# Patient Record
Sex: Female | Born: 1945 | Race: White | Hispanic: No | Smoking: Never smoker
Health system: Southern US, Community
[De-identification: ages and names within clinical notes are randomized; demographics above are authoritative.]

## PROBLEM LIST (undated history)

## (undated) DIAGNOSIS — J45909 Unspecified asthma, uncomplicated: Secondary | ICD-10-CM

## (undated) DIAGNOSIS — M1990 Unspecified osteoarthritis, unspecified site: Secondary | ICD-10-CM

## (undated) DIAGNOSIS — I10 Essential (primary) hypertension: Secondary | ICD-10-CM

## (undated) DIAGNOSIS — Z5189 Encounter for other specified aftercare: Secondary | ICD-10-CM

## (undated) DIAGNOSIS — M810 Age-related osteoporosis without current pathological fracture: Secondary | ICD-10-CM

## (undated) HISTORY — PX: COLON SURGERY: SHX602

## (undated) HISTORY — PX: TUBAL LIGATION: SHX77

---

## 1998-04-03 ENCOUNTER — Ambulatory Visit (HOSPITAL_COMMUNITY): Admission: RE | Admit: 1998-04-03 | Discharge: 1998-04-03 | Payer: Self-pay | Admitting: Family Medicine

## 1998-04-03 ENCOUNTER — Inpatient Hospital Stay (HOSPITAL_COMMUNITY): Admission: EM | Admit: 1998-04-03 | Discharge: 1998-04-09 | Payer: Self-pay | Admitting: Emergency Medicine

## 1998-04-03 ENCOUNTER — Encounter: Payer: Self-pay | Admitting: Gastroenterology

## 1999-01-22 ENCOUNTER — Ambulatory Visit (HOSPITAL_COMMUNITY): Admission: RE | Admit: 1999-01-22 | Discharge: 1999-01-22 | Payer: Self-pay | Admitting: Gastroenterology

## 2000-11-24 ENCOUNTER — Encounter: Payer: Self-pay | Admitting: Gastroenterology

## 2000-11-24 ENCOUNTER — Encounter: Admission: RE | Admit: 2000-11-24 | Discharge: 2000-11-24 | Payer: Self-pay | Admitting: Gastroenterology

## 2000-12-02 ENCOUNTER — Ambulatory Visit (HOSPITAL_COMMUNITY): Admission: RE | Admit: 2000-12-02 | Discharge: 2000-12-02 | Payer: Self-pay | Admitting: Gastroenterology

## 2000-12-02 ENCOUNTER — Encounter (INDEPENDENT_AMBULATORY_CARE_PROVIDER_SITE_OTHER): Payer: Self-pay | Admitting: *Deleted

## 2010-06-19 ENCOUNTER — Other Ambulatory Visit: Payer: Self-pay | Admitting: Obstetrics and Gynecology

## 2010-07-12 NOTE — Procedures (Signed)
[place]. [facility]  Patient:    Monica Conway                          MRN: [id number] Proc. Date: 01/22/99 Adm. Date:  [id number] Attending:  Charna Elizabeth CC:         Meliton Rattan, M.D.                           Procedure Report  DATE OF BIRTH:  1945-02-28.  REFERRING PHYSICIAN:  Meliton Rattan, M.D.  PROCEDURE:  Flexible sigmoidoscopy.  ENDOSCOPIST:  Anselmo Rod, M.D.  INSTRUMENTS:  Olympus video colonoscope.  INDICATION:  Screening flexible sigmoidoscopy being done in a 65 year old white  female.  Rule out polyps, AVMs, masses, hemorrhoids, etc.  INFORMED CONSENT:  Informed consent was procured from the patient.  The patient was fasted for eight hours prior to the procedure and prepped with two Fleets enemas the morning of the procedure.  PHYSICAL EXAMINATION:  VITAL SIGNS:  Stable.  NECK:  Supple.  CHEST:  Clear to auscultation.  HEART:  S1 and S2 regular.  ABDOMEN:  Soft with normal abdominal bowel sounds.  One healed surgical scar present.  DESCRIPTION OF PROCEDURE:  The patient was placed in the left lateral decubitus  position.  No sedation was used.  Once the patient was adequately positioned, the Olympus video colonoscope was advanced from the rectum to 60 cm without difficulty.  Except for small internal hemorrhoids, no other abnormalities were seen.  The patient tolerated the procedure well without complications.  IMPRESSION:  Normal flexible sigmoidoscopy up to 60 cm except for small nonbleeding internal hemorrhoids.  RECOMMENDATIONS:  Repeat flexible sigmoidoscopy recommended in the next five years or earlier if need be.  High fiber diet has been advised for the patient and an  increase in fibers has been applied and follow-up as advised and needed. DD:  01/22/99 TD:  01/22/99 Job: [id number]

## 2010-07-12 NOTE — Procedures (Signed)
[place]. [facility]  Patient:    Monica Conway, Monica Conway Visit Number: [id number] MRN: [id number]          Service Type: END Location: ENDO Attending Physician:  Charna Elizabeth Dictated by:   Anselmo Rod, M.D. Proc. Date: 12/02/00 Admit Date:  12/02/2000   CC:         Gabriel Earing, M.D.   Procedure Report  DATE OF BIRTH:  Oct 26, 1945.  REFERRING PHYSICIAN:  Gabriel Earing, M.D.  PROCEDURE PERFORMED:  Esophagogastroduodenoscopy with biopsies and dilatation of a Schatzkis ring.  ENDOSCOPIST:  Anselmo Rod, M.D.  INSTRUMENT USED:  Olympus video panendoscope.  INDICATIONS FOR PROCEDURE:  The patient is a 65 year old white female with a two-month history of dysphagia especially for solids.  Barium swallow showed a Schatzkis ring which did not allow the passage of a 12.5 mm barium tablet.  PREPROCEDURE PREPARATION:  Informed consent was procured from the patient. The patient was fasted for eight hours prior to the procedure.  PREPROCEDURE PHYSICAL:  The patient had stable vital signs.  Neck supple. Chest clear to auscultation.  S1, S2 regular.  Abdomen soft with normal abdominal bowel sounds.  A well-healed surgical scar from previous surgery secondary to an intussusception.  DESCRIPTION OF PROCEDURE:  The patient was placed in left lateral decubitus position and sedated with 50 mg of Demerol and 5 mg of Versed intravenously. Once the patient was adequately sedated and maintained on low-flow oxygen and continuous cardiac monitoring, the Olympus video panendoscope was advanced through the mouthpiece, over the tongue, into the esophagus under direct vision.  The proximal esophagus appeared normal.  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  On advancement of the scope into the stomach there was a small hiatal hernia seen on high retroflexion.  There was a linear antral ulcer that was biopsied for pathology to rule out H.  pylori. The proximal small bowel appeared normal.  There was mild diffuse gastritis noticed throughout the gastric mucosa.  The Schatzkis ring was dilated with the 16 and 17 French Savary dilator over the guide wire in routine manner but repeat endoscopy did not show much of a change in the Schatzkis ring and therefore the patients position was changed into the supine position and she was propped up on the stretcher and dilated with [facility] dilators sized 52 and 54 mm.  The patient tolerated the procedure well without complications.  IMPRESSION: 1. 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  See description    above. 2. Linear antral ulcer biopsied for pathology. 3. Small hiatal hernia. 4. Moderate diffuse gastritis. 5. Normal proximal small bowel.  RECOMMENDATION: 1. Continue PPIs. 2. Treat with antibiotics if Helicobacter pylori present on pathology. 3. Avoid all nonsteroidals. 4. Follow antireflux measures. 5. Soft diet for the next two to three days. 6. Outpatient follow-up within the next seven to 10 days. Dictated by:   Anselmo Rod, M.D. Attending Physician:  Charna Elizabeth DD:  12/02/00 TD:  12/02/00 Job: [id number]

## 2010-07-24 ENCOUNTER — Other Ambulatory Visit: Payer: Self-pay | Admitting: Obstetrics and Gynecology

## 2010-07-24 ENCOUNTER — Encounter (HOSPITAL_COMMUNITY): Payer: BC Managed Care – PPO

## 2010-07-24 LAB — BASIC METABOLIC PANEL
CO2: 15 mEq/L — ABNORMAL LOW (ref 19–32)
Calcium: 9.7 mg/dL (ref 8.4–10.5)
Creatinine, Ser: 1.73 mg/dL — ABNORMAL HIGH (ref 0.4–1.2)
GFR calc Af Amer: 36 mL/min — ABNORMAL LOW (ref >60)
Glucose, Bld: 116 mg/dL — ABNORMAL HIGH (ref 70–99)

## 2010-07-24 LAB — CBC
HCT: 41.5 % (ref 36.0–46.0)
Hemoglobin: 13.7 g/dL (ref 12.0–15.0)
MCH: 29.1 pg (ref 26.0–34.0)
MCHC: 33 g/dL (ref 30.0–36.0)
RDW: 15.9 % — ABNORMAL HIGH (ref 11.5–15.5)

## 2010-08-02 ENCOUNTER — Ambulatory Visit (HOSPITAL_COMMUNITY)
Admission: RE | Admit: 2010-08-02 | Discharge: 2010-08-02 | Disposition: A | Discharge status: Home / Self Care | Payer: BC Managed Care – PPO | Source: Ambulatory Visit | Attending: Obstetrics and Gynecology | Admitting: Obstetrics and Gynecology

## 2010-08-02 ENCOUNTER — Other Ambulatory Visit: Payer: Self-pay | Admitting: Obstetrics and Gynecology

## 2010-08-02 DIAGNOSIS — Z01818 Encounter for other preprocedural examination: Secondary | ICD-10-CM | POA: Insufficient documentation

## 2010-08-02 DIAGNOSIS — Z01812 Encounter for preprocedural laboratory examination: Secondary | ICD-10-CM | POA: Insufficient documentation

## 2010-08-02 DIAGNOSIS — R87613 High grade squamous intraepithelial lesion on cytologic smear of cervix (HGSIL): Secondary | ICD-10-CM | POA: Insufficient documentation

## 2010-08-12 NOTE — Op Note (Signed)
NAME:  Monica Conway, VIRAMONTES NO.:  [id number]  MEDICAL RECORD NO.:  [id number]  LOCATION:  [facility]                          FACILITY:  [facility]  PHYSICIAN:  Miguel Aschoff, M.D.       DATE OF BIRTH:  December 19, 1945  DATE OF PROCEDURE: DATE OF DISCHARGE:                              OPERATIVE REPORT   PREOPERATIVE DIAGNOSIS:  High-grade cervical dysplasia.  POSTOPERATIVE DIAGNOSIS:  High-grade cervical dysplasia.  PROCEDURE:  Cervical conization and endocervical curettage.  SURGEON:  Miguel Aschoff, MD  ANESTHESIA:  General.  COMPLICATIONS:  None.  JUSTIFICATION:  The patient is a 65 year old white female with history of high-grade cervical dysplasia, colposcopy is unsatisfactory because of the atrophic and stenotic nature of her cervix.  It is not possible to visualize the cervix satisfactorily with colposcopy.  Because of this, she presents now to undergo cone biopsy and endocervical curettage as both diagnostic procedure and hopefully therapeutic procedure to be for high-grade dysplasia.  Risks and benefits of procedure were discussed with the patient.  PROCEDURE:  The patient was taken to the operating room, placed in supine position.  General anesthesia was administered without difficulty.  She was then placed in dorsal lithotomy position, prepped and draped in the usual sterile fashion.  Bladder was catheterized. Once this was done, examination under anesthesia revealed normal external genitalia, normal Bartholin and Skene glands, normal urethra. The vaginal vault was with gross lesion.  There was a second-degree rectocele, first-degree cystocele.  The apex of vagina, however, revealed very atrophic cervix very hard to identify, but it was possible to palpate the cervix.  The cervix was then grasped with tenaculum. Once this was done, cervix and the vaginal vault were stained with Lugol's solution.  There was no significant areas of poor Lugol's uptake in the vagina and  the Lugol's was taken out irregularly on the cervix. At this point, figure-of-eight sutures of 0 chromic were placed by placing sutures at the 2-4 o'clock positions and then the 8 o'clock and 10 o'clock positions for retraction and to occlude the descending branch of the cervical artery.  Once this was done, the cervix was injected with 8 mL 1% Xylocaine with epinephrine.  Then a cone of tissue was cut with care to avoid any injury to any adjacent structures.  It was somewhat difficult to do because of atrophic nature of the cervix, but a satisfactory specimen was obtained.  Once this was done, the residual portion of the endocervical canal was curetted and a scant amount of tissue was sent for histologic study.  The bed of the cone biopsy site was then cauterized with electrocautery.  Hemostasis was brought under control without difficulty and then the defect was packed with 3 pieces of Gelfoam and the previously placed sutures were tied across the cervix to hold the Gelfoam in place.  The estimated blood loss was minimal. The patient tolerated the procedure well.  Plan is for the patient to be discharged home.  Medications for home include doxycycline 100 mg twice a day x3 days, Ultram 50 mg every 6-8 hours as needed for pain.  The patient is to resume all her other medications.  She was instructed to place nothing in the vagina for 4 weeks, to call for any problems such as fever, pain or heavy bleeding and to call for pathology report on August 07, 2010.     Miguel Aschoff, M.D.     AR/MEDQ  D:  08/02/2010  T:  08/02/2010  Job:  [id number]  Electronically Signed by Miguel Aschoff M.D. on 08/12/2010 05:38:21 PM

## 2010-11-01 ENCOUNTER — Other Ambulatory Visit: Payer: Self-pay | Admitting: Obstetrics and Gynecology

## 2012-06-23 ENCOUNTER — Other Ambulatory Visit: Payer: Self-pay | Admitting: Obstetrics and Gynecology

## 2013-06-24 ENCOUNTER — Other Ambulatory Visit: Payer: Self-pay | Admitting: Obstetrics and Gynecology

## 2013-06-28 ENCOUNTER — Other Ambulatory Visit: Payer: Self-pay | Admitting: Obstetrics and Gynecology

## 2013-06-28 DIAGNOSIS — N63 Unspecified lump in unspecified breast: Secondary | ICD-10-CM

## 2013-07-07 ENCOUNTER — Encounter (INDEPENDENT_AMBULATORY_CARE_PROVIDER_SITE_OTHER): Payer: Self-pay

## 2013-07-07 ENCOUNTER — Ambulatory Visit
Admission: RE | Admit: 2013-07-07 | Discharge: 2013-07-07 | Disposition: A | Discharge status: Home / Self Care | Payer: Medicare HMO | Source: Ambulatory Visit | Attending: Obstetrics and Gynecology | Admitting: Obstetrics and Gynecology

## 2013-07-07 DIAGNOSIS — N63 Unspecified lump in unspecified breast: Secondary | ICD-10-CM

## 2014-02-04 ENCOUNTER — Emergency Department (HOSPITAL_COMMUNITY): Payer: Medicare HMO

## 2014-02-04 ENCOUNTER — Encounter (HOSPITAL_COMMUNITY): Payer: Self-pay | Admitting: *Deleted

## 2014-02-04 ENCOUNTER — Inpatient Hospital Stay (HOSPITAL_COMMUNITY)
Admission: EM | Admit: 2014-02-04 | Discharge: 2014-02-07 | DRG: 871 | Disposition: A | Discharge status: Skilled Nursing Facility | Payer: Medicare HMO | Attending: Internal Medicine | Admitting: Internal Medicine

## 2014-02-04 DIAGNOSIS — N390 Urinary tract infection, site not specified: Secondary | ICD-10-CM

## 2014-02-04 DIAGNOSIS — Z91048 Other nonmedicinal substance allergy status: Secondary | ICD-10-CM

## 2014-02-04 DIAGNOSIS — Z8249 Family history of ischemic heart disease and other diseases of the circulatory system: Secondary | ICD-10-CM

## 2014-02-04 DIAGNOSIS — R6510 Systemic inflammatory response syndrome (SIRS) of non-infectious origin without acute organ dysfunction: Secondary | ICD-10-CM | POA: Diagnosis present

## 2014-02-04 DIAGNOSIS — J45909 Unspecified asthma, uncomplicated: Secondary | ICD-10-CM | POA: Diagnosis present

## 2014-02-04 DIAGNOSIS — M1990 Unspecified osteoarthritis, unspecified site: Secondary | ICD-10-CM | POA: Diagnosis present

## 2014-02-04 DIAGNOSIS — E860 Dehydration: Secondary | ICD-10-CM

## 2014-02-04 DIAGNOSIS — D72829 Elevated white blood cell count, unspecified: Secondary | ICD-10-CM | POA: Diagnosis present

## 2014-02-04 DIAGNOSIS — A419 Sepsis, unspecified organism: Principal | ICD-10-CM

## 2014-02-04 DIAGNOSIS — R319 Hematuria, unspecified: Secondary | ICD-10-CM

## 2014-02-04 DIAGNOSIS — G9340 Encephalopathy, unspecified: Secondary | ICD-10-CM

## 2014-02-04 DIAGNOSIS — Z66 Do not resuscitate: Secondary | ICD-10-CM | POA: Diagnosis present

## 2014-02-04 DIAGNOSIS — R509 Fever, unspecified: Secondary | ICD-10-CM

## 2014-02-04 DIAGNOSIS — Z79899 Other long term (current) drug therapy: Secondary | ICD-10-CM

## 2014-02-04 DIAGNOSIS — I10 Essential (primary) hypertension: Secondary | ICD-10-CM | POA: Diagnosis present

## 2014-02-04 DIAGNOSIS — E876 Hypokalemia: Secondary | ICD-10-CM | POA: Diagnosis present

## 2014-02-04 DIAGNOSIS — R410 Disorientation, unspecified: Secondary | ICD-10-CM

## 2014-02-04 DIAGNOSIS — R7989 Other specified abnormal findings of blood chemistry: Secondary | ICD-10-CM | POA: Diagnosis present

## 2014-02-04 DIAGNOSIS — R809 Proteinuria, unspecified: Secondary | ICD-10-CM | POA: Diagnosis present

## 2014-02-04 DIAGNOSIS — R945 Abnormal results of liver function studies: Secondary | ICD-10-CM

## 2014-02-04 HISTORY — DX: Encounter for other specified aftercare: Z51.89

## 2014-02-04 HISTORY — DX: Unspecified asthma, uncomplicated: J45.909

## 2014-02-04 HISTORY — DX: Unspecified osteoarthritis, unspecified site: M19.90

## 2014-02-04 HISTORY — DX: Essential (primary) hypertension: I10

## 2014-02-04 LAB — COMPREHENSIVE METABOLIC PANEL
ALBUMIN: 3.8 g/dL (ref 3.5–5.2)
ALK PHOS: 91 U/L (ref 39–117)
ALT: 56 U/L — AB (ref 0–35)
AST: 157 U/L — AB (ref 0–37)
Anion gap: 20 — ABNORMAL HIGH (ref 5–15)
BUN: 42 mg/dL — ABNORMAL HIGH (ref 6–23)
CO2: 16 mEq/L — ABNORMAL LOW (ref 19–32)
Calcium: 8.3 mg/dL — ABNORMAL LOW (ref 8.4–10.5)
Chloride: 106 mEq/L (ref 96–112)
Creatinine, Ser: 1.21 mg/dL — ABNORMAL HIGH (ref 0.50–1.10)
GFR calc non Af Amer: 45 mL/min — ABNORMAL LOW (ref >90)
GFR, EST AFRICAN AMERICAN: 52 mL/min — AB (ref >90)
Glucose, Bld: 125 mg/dL — ABNORMAL HIGH (ref 70–99)
POTASSIUM: 4.1 meq/L (ref 3.7–5.3)
Sodium: 142 mEq/L (ref 137–147)
Total Bilirubin: 1 mg/dL (ref 0.3–1.2)
Total Protein: 8.3 g/dL (ref 6.0–8.3)

## 2014-02-04 LAB — URINALYSIS, ROUTINE W REFLEX MICROSCOPIC
Glucose, UA: NEGATIVE mg/dL
Ketones, ur: 15 mg/dL — AB
NITRITE: NEGATIVE
SPECIFIC GRAVITY, URINE: 1.025 (ref 1.005–1.030)
Urobilinogen, UA: 0.2 mg/dL (ref 0.0–1.0)
pH: 6 (ref 5.0–8.0)

## 2014-02-04 LAB — URINE MICROSCOPIC-ADD ON

## 2014-02-04 LAB — CBC WITH DIFFERENTIAL/PLATELET
BASOS ABS: 0.1 10*3/uL (ref 0.0–0.1)
BASOS PCT: 0 % (ref 0–1)
EOS ABS: 0 10*3/uL (ref 0.0–0.7)
EOS PCT: 0 % (ref 0–5)
HCT: 41.5 % (ref 36.0–46.0)
Hemoglobin: 13.6 g/dL (ref 12.0–15.0)
LYMPHS ABS: 1.2 10*3/uL (ref 0.7–4.0)
Lymphocytes Relative: 7 % — ABNORMAL LOW (ref 12–46)
MCH: 29.2 pg (ref 26.0–34.0)
MCHC: 32.8 g/dL (ref 30.0–36.0)
MCV: 89.2 fL (ref 78.0–100.0)
Monocytes Absolute: 1.9 10*3/uL — ABNORMAL HIGH (ref 0.1–1.0)
Monocytes Relative: 12 % (ref 3–12)
Neutro Abs: 13.1 10*3/uL — ABNORMAL HIGH (ref 1.7–7.7)
Neutrophils Relative %: 81 % — ABNORMAL HIGH (ref 43–77)
PLATELETS: 317 10*3/uL (ref 150–400)
RBC: 4.65 MIL/uL (ref 3.87–5.11)
RDW: 16.1 % — ABNORMAL HIGH (ref 11.5–15.5)
WBC: 16.3 10*3/uL — ABNORMAL HIGH (ref 4.0–10.5)

## 2014-02-04 LAB — I-STAT TROPONIN, ED: TROPONIN I, POC: 0.01 ng/mL (ref 0.00–0.08)

## 2014-02-04 LAB — I-STAT CG4 LACTIC ACID, ED: LACTIC ACID, VENOUS: 0.85 mmol/L (ref 0.5–2.2)

## 2014-02-04 MED ORDER — ACETAMINOPHEN 325 MG PO TABS
650.0000 mg | ORAL_TABLET | Freq: Once | ORAL | Status: AC
  Administered 2014-02-04: 650 mg via ORAL
  Filled 2014-02-04: qty 2

## 2014-02-04 NOTE — ED Notes (Signed)
Family took pt. Purse home
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
## 2014-02-04 NOTE — H&P (Signed)
PCP: No primary care provider on file.    Chief Complaint:  confusion  HPI: Monica Conway is a 68 y.o. female   has a past medical history of Arthritis; Asthma; Blood transfusion without reported diagnosis; and Hypertension.   Presented with  Patient was found out on her porch, in what appeared to be pajamas. She states that she was working at her house [employer] service [employer]. She states she was not drinking or eating anything all day. Patient states she noted water leaking. She still appears slightly confused.   Hospitalist was called for admission for Confusion  Review of Systems:    Pertinent positives include: confusion, Fevers,   Constitutional:  No weight loss, night sweats, chills, fatigue, weight loss  HEENT:  No headaches, Difficulty swallowing,Tooth/dental problems,Sore throat,  No sneezing, itching, ear ache, nasal congestion, post nasal drip,  Cardio-vascular:  No chest pain, Orthopnea, PND, anasarca, dizziness, palpitations.no Bilateral lower extremity swelling  GI:  No heartburn, indigestion, abdominal pain, nausea, vomiting, diarrhea, change in bowel habits, loss of appetite, melena, blood in stool, hematemesis Resp:  no shortness of breath at rest. No dyspnea on exertion, No excess mucus, no productive cough, No non-productive cough, No coughing up of blood.No change in color of mucus.No wheezing. Skin:  no rash or lesions. No jaundice GU:  no dysuria, change in color of urine, no urgency or frequency. No straining to urinate.  No flank pain.  Musculoskeletal:  No joint pain or no joint swelling. No decreased range of motion. No back pain.  Psych:  No change in mood or affect. No depression or anxiety. No memory loss.  Neuro: no localizing neurological complaints, no tingling, no weakness, no double vision, no gait abnormality, no slurred speech, no   Otherwise ROS are negative except for above, 10 systems were reviewed  Past Medical History: Past Medical History    Diagnosis Date  . Arthritis   . Asthma   . Blood transfusion without reported diagnosis   . Hypertension    Past Surgical History  Procedure Laterality Date  . Colon surgery    . Tubal ligation       Medications: Prior to Admission medications   Medication Sig Start Date End Date Taking? Authorizing Provider  acetaminophen (TYLENOL) 500 MG tablet Take 500 mg by mouth every morning.   Yes Historical Provider, MD  amLODipine (NORVASC) 10 MG tablet Take 10 mg by mouth daily.   Yes Historical Provider, MD  baclofen (LIORESAL) 10 MG tablet Take 10 mg by mouth as needed for muscle spasms.    Yes Historical Provider, MD  Cholecalciferol 1000 UNITS capsule Take 1,000 Units by mouth daily.   Yes Historical Provider, MD  denosumab (PROLIA) 60 MG/ML SOLN injection Inject 60 mg into the skin every 6 (six) months. Administer in upper arm, thigh, or abdomen   Yes Historical Provider, MD  ferrous sulfate 325 (65 FE) MG EC tablet Take 325 mg by mouth daily with breakfast.   Yes Historical Provider, MD  lisinopril (PRINIVIL,ZESTRIL) 2.5 MG tablet Take 2.5 mg by mouth daily.   Yes Historical Provider, MD  magnesium oxide (MAG-OX) 400 MG tablet Take 400 mg by mouth 2 (two) times daily.   Yes Historical Provider, MD  meloxicam (MOBIC) 15 MG tablet Take 15 mg by mouth daily.   Yes Historical Provider, MD  Multiple Vitamin (MULTIVITAMIN WITH MINERALS) TABS tablet Take 1 tablet by mouth daily.   Yes Historical Provider, MD  Omega-3 Fatty Acids (OMEGA-3 FISH  OIL PO) Take 15 mLs by mouth daily.   Yes Historical Provider, MD  omeprazole (PRILOSEC) 20 MG capsule Take 20 mg by mouth daily.   Yes Historical Provider, MD  zoledronic acid (RECLAST) 5 MG/100ML SOLN injection Inject 5 mg into the vein once.   Yes Historical Provider, MD    Allergies:   Allergies  Allergen Reactions  . Pollen Extract Shortness Of Breath    Social History:  Ambulatory   Independently  Lives at home alone,        reports  that she has never smoked. She has never used smokeless tobacco. She reports that she does not drink alcohol or use illicit drugs.    Family History: family history includes Cataracts in her sister; Hypertension in her brother and sister.    Physical Exam: Patient Vitals for the past 24 hrs:  BP Temp Temp src Pulse Resp SpO2 Height Weight  02/04/14 2238 - 97.5 F (36.4 C) Oral - - - - -  02/04/14 2230 161/95 mmHg - - 107 21 96 % - -  02/04/14 2130 150/80 mmHg - - 104 19 96 % - -  02/04/14 2115 153/81 mmHg - - 106 23 97 % - -  02/04/14 2100 162/86 mmHg - - 109 19 97 % - -  02/04/14 2045 147/84 mmHg - - 117 20 97 % - -  02/04/14 1956 157/85 mmHg 98.4 F (36.9 C) Oral 118 15 98 % 5\' 1"  (1.549 m) 53.978 kg (119 lb)  02/04/14 1950 - - - - - 97 % - -    1. General:  in No Acute distress 2. Psychological: Alert and   Oriented to self, situation  not year,  3. Head/ENT:    Dry Mucous Membranes                          Head Non traumatic, neck supple                          Normal  Dentition 4. SKIN:   decreased Skin turgor,  Skin clean Dry and intact no rash 5. Heart: Regular rate and rhythm no Murmur, Rub or gallop 6. Lungs, no wheezes or crackles  somewhat distant breath sounds 7. Abdomen: Soft, non-tender, Non distended 8. Lower extremities: no clubbing, cyanosis, or edema 9. Neurologically strength 5 out of 5 in all 4 extremities cranials 2 through 12 intact 10. MSK: Normal range of motion  body mass index is 22.5 kg/(m^2).   Labs on Admission:   Results for orders placed or performed during the hospital encounter of 02/04/14 (from the past 24 hour(s))  Urinalysis, Routine w reflex microscopic     Status: Abnormal   Collection Time: 02/04/14  8:20 PM  Result Value Ref Range   Color, Urine YELLOW YELLOW   APPearance CLOUDY (A) CLEAR   Specific Gravity, Urine 1.025 1.005 - 1.030   pH 6.0 5.0 - 8.0   Glucose, UA NEGATIVE NEGATIVE mg/dL   Hgb urine dipstick LARGE (A) NEGATIVE    Bilirubin Urine SMALL (A) NEGATIVE   Ketones, ur 15 (A) NEGATIVE mg/dL   Protein, ur >161 (A) NEGATIVE mg/dL   Urobilinogen, UA 0.2 0.0 - 1.0 mg/dL   Nitrite NEGATIVE NEGATIVE   Leukocytes, UA SMALL (A) NEGATIVE  Urine microscopic-add on     Status: Abnormal   Collection Time: 02/04/14  8:20 PM  Result Value Ref Range  Squamous Epithelial / LPF RARE RARE   WBC, UA 7-10 <3 WBC/hpf   RBC / HPF 3-6 <3 RBC/hpf   Bacteria, UA RARE RARE   Casts GRANULAR CAST (A) NEGATIVE  CBC with Differential     Status: Abnormal   Collection Time: 02/04/14  8:32 PM  Result Value Ref Range   WBC 16.3 (H) 4.0 - 10.5 K/uL   RBC 4.65 3.87 - 5.11 MIL/uL   Hemoglobin 13.6 12.0 - 15.0 g/dL   HCT 72.541.5 36.636.0 - 44.046.0 %   MCV 89.2 78.0 - 100.0 fL   MCH 29.2 26.0 - 34.0 pg   MCHC 32.8 30.0 - 36.0 g/dL   RDW 34.716.1 (H) 42.511.5 - 95.615.5 %   Platelets 317 150 - 400 K/uL   Neutrophils Relative % 81 (H) 43 - 77 %   Neutro Abs 13.1 (H) 1.7 - 7.7 K/uL   Lymphocytes Relative 7 (L) 12 - 46 %   Lymphs Abs 1.2 0.7 - 4.0 K/uL   Monocytes Relative 12 3 - 12 %   Monocytes Absolute 1.9 (H) 0.1 - 1.0 K/uL   Eosinophils Relative 0 0 - 5 %   Eosinophils Absolute 0.0 0.0 - 0.7 K/uL   Basophils Relative 0 0 - 1 %   Basophils Absolute 0.1 0.0 - 0.1 K/uL  Comprehensive metabolic panel     Status: Abnormal   Collection Time: 02/04/14  8:32 PM  Result Value Ref Range   Sodium 142 137 - 147 mEq/L   Potassium 4.1 3.7 - 5.3 mEq/L   Chloride 106 96 - 112 mEq/L   CO2 16 (L) 19 - 32 mEq/L   Glucose, Bld 125 (H) 70 - 99 mg/dL   BUN 42 (H) 6 - 23 mg/dL   Creatinine, Ser 3.871.21 (H) 0.50 - 1.10 mg/dL   Calcium 8.3 (L) 8.4 - 10.5 mg/dL   Total Protein 8.3 6.0 - 8.3 g/dL   Albumin 3.8 3.5 - 5.2 g/dL   AST 564157 (H) 0 - 37 U/L   ALT 56 (H) 0 - 35 U/L   Alkaline Phosphatase 91 39 - 117 U/L   Total Bilirubin 1.0 0.3 - 1.2 mg/dL   GFR calc non Af Amer 45 (L) >90 mL/min   GFR calc Af Amer 52 (L) >90 mL/min   Anion gap 20 (H) 5 - 15  I-Stat  CG4 Lactic Acid, ED     Status: None   Collection Time: 02/04/14 10:52 PM  Result Value Ref Range   Lactic Acid, Venous 0.85 0.5 - 2.2 mmol/L  I-stat troponin, ED     Status: None   Collection Time: 02/04/14 11:06 PM  Result Value Ref Range   Troponin i, poc 0.01 0.00 - 0.08 ng/mL   Comment 3            UA proteinuria, elevated WBC  No results found for: HGBA1C  Estimated Creatinine Clearance: 34 mL/min (by C-G formula based on Cr of 1.21).  BNP (last 3 results) No results for input(s): PROBNP in the last 8760 hours.  Other results:  I have pearsonaly reviewed this: ECG REPORT not obtained ordered  [facility]   02/04/14 1956  Weight: 53.978 kg (119 lb)     Cultures: No results found for: SDES, SPECREQUEST, CULT, REPTSTATUS   Radiological Exams on Admission: Dg Chest 2 View  02/04/2014   CLINICAL DATA:  Fever today. No cough or shortness of breath. No chest pain. History of hypertension. History of asthma and stent.  EXAM: CHEST  2 VIEW  COMPARISON:  None.  FINDINGS: Heart size is normal. Lungs are clear. No pulmonary edema. No pleural effusions. Degenerative changes are seen in the spine.  IMPRESSION: No active cardiopulmonary disease.   Electronically Signed   By: Rosalie Gums M.D.   On: 02/04/2014 21:51    Chart has been reviewed  Assessment/Plan  68 year old female with history of hypertension presents with leukocytosis mild UTI and dehydration complicated by acute encephalopathy currently improved  Present on Admission:  . Sepsis - likely secondary to mild UTI. Await results of urine culture and treat  . Encephalopathy acute - in a setting dehydration currently improving ammonia level normal, CT of the head showing no acute findings. Neurologically intact. Improved after IV fluid administration, further imaging such as MRI could be considered if patient not improved by tomorrow despite medical management  . Elevated LFTs - in a setting of slight dehydration. No  evidence of biliary disease per ultrasound. No evidence of hepato-steatosis. We will follow LFTs obtain hepatitis panel  . Dehydration - administrative fluid  . UTI (lower urinary tract infection) - treated with Rocephin await results of urine culture  . Leukocytosis - likely secondary to UTI. Of note she have had elevated white blood cell count 2012 as well. We'll need to follow to make sure resolution   proteinuria - will obtain SPEP UPEP   Prophylaxis: SCD , Protonix  CODE STATUS:  DNR/DNI as per patient  Other plan as per orders.  I have spent a total of 55 min on this admission  Viktoria Gruetzmacher 02/04/2014, 11:18 PM  [facility]  Pager [phone]   after 2 AM please page floor coverage PA If 7AM-7PM, please contact the day team taking care of the patient  [email or web address]  Password [email or web address]
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
## 2014-02-05 ENCOUNTER — Emergency Department (HOSPITAL_COMMUNITY): Payer: Medicare HMO

## 2014-02-05 ENCOUNTER — Encounter (HOSPITAL_COMMUNITY): Payer: Self-pay | Admitting: *Deleted

## 2014-02-05 DIAGNOSIS — R7989 Other specified abnormal findings of blood chemistry: Secondary | ICD-10-CM | POA: Diagnosis present

## 2014-02-05 DIAGNOSIS — J45909 Unspecified asthma, uncomplicated: Secondary | ICD-10-CM | POA: Diagnosis present

## 2014-02-05 DIAGNOSIS — B9620 Unspecified Escherichia coli [E. coli] as the cause of diseases classified elsewhere: Secondary | ICD-10-CM | POA: Diagnosis present

## 2014-02-05 DIAGNOSIS — M1990 Unspecified osteoarthritis, unspecified site: Secondary | ICD-10-CM | POA: Diagnosis present

## 2014-02-05 DIAGNOSIS — I10 Essential (primary) hypertension: Secondary | ICD-10-CM | POA: Diagnosis present

## 2014-02-05 DIAGNOSIS — Z8249 Family history of ischemic heart disease and other diseases of the circulatory system: Secondary | ICD-10-CM | POA: Diagnosis not present

## 2014-02-05 DIAGNOSIS — R809 Proteinuria, unspecified: Secondary | ICD-10-CM | POA: Diagnosis present

## 2014-02-05 DIAGNOSIS — E876 Hypokalemia: Secondary | ICD-10-CM | POA: Diagnosis present

## 2014-02-05 DIAGNOSIS — E860 Dehydration: Secondary | ICD-10-CM | POA: Diagnosis present

## 2014-02-05 DIAGNOSIS — N390 Urinary tract infection, site not specified: Secondary | ICD-10-CM | POA: Diagnosis present

## 2014-02-05 DIAGNOSIS — D72829 Elevated white blood cell count, unspecified: Secondary | ICD-10-CM | POA: Diagnosis present

## 2014-02-05 DIAGNOSIS — Z66 Do not resuscitate: Secondary | ICD-10-CM | POA: Diagnosis present

## 2014-02-05 DIAGNOSIS — Z79899 Other long term (current) drug therapy: Secondary | ICD-10-CM | POA: Diagnosis not present

## 2014-02-05 DIAGNOSIS — A419 Sepsis, unspecified organism: Secondary | ICD-10-CM | POA: Diagnosis present

## 2014-02-05 DIAGNOSIS — Z91048 Other nonmedicinal substance allergy status: Secondary | ICD-10-CM | POA: Diagnosis not present

## 2014-02-05 DIAGNOSIS — G9340 Encephalopathy, unspecified: Secondary | ICD-10-CM | POA: Diagnosis present

## 2014-02-05 LAB — COMPREHENSIVE METABOLIC PANEL
ALK PHOS: 81 U/L (ref 39–117)
ALT: 60 U/L — ABNORMAL HIGH (ref 0–35)
ANION GAP: 19 — AB (ref 5–15)
AST: 171 U/L — ABNORMAL HIGH (ref 0–37)
Albumin: 3.2 g/dL — ABNORMAL LOW (ref 3.5–5.2)
BILIRUBIN TOTAL: 0.8 mg/dL (ref 0.3–1.2)
BUN: 38 mg/dL — ABNORMAL HIGH (ref 6–23)
CHLORIDE: 103 meq/L (ref 96–112)
CO2: 16 mEq/L — ABNORMAL LOW (ref 19–32)
Calcium: 7.4 mg/dL — ABNORMAL LOW (ref 8.4–10.5)
Creatinine, Ser: 1.04 mg/dL (ref 0.50–1.10)
GFR calc non Af Amer: 54 mL/min — ABNORMAL LOW (ref >90)
GFR, EST AFRICAN AMERICAN: 63 mL/min — AB (ref >90)
GLUCOSE: 95 mg/dL (ref 70–99)
POTASSIUM: 3.4 meq/L — AB (ref 3.7–5.3)
Sodium: 138 mEq/L (ref 137–147)
TOTAL PROTEIN: 7.2 g/dL (ref 6.0–8.3)

## 2014-02-05 LAB — AMMONIA: Ammonia: 33 umol/L (ref 11–60)

## 2014-02-05 LAB — PHOSPHORUS: Phosphorus: 3 mg/dL (ref 2.3–4.6)

## 2014-02-05 LAB — TROPONIN I: Troponin I: <0.3 ng/mL (ref <0.30)

## 2014-02-05 LAB — TSH: TSH: 1.69 u[IU]/mL (ref 0.350–4.500)

## 2014-02-05 LAB — CBC
HCT: 39.7 % (ref 36.0–46.0)
Hemoglobin: 12.7 g/dL (ref 12.0–15.0)
MCH: 28.5 pg (ref 26.0–34.0)
MCHC: 32 g/dL (ref 30.0–36.0)
MCV: 89 fL (ref 78.0–100.0)
PLATELETS: 272 10*3/uL (ref 150–400)
RBC: 4.46 MIL/uL (ref 3.87–5.11)
RDW: 15.9 % — AB (ref 11.5–15.5)
WBC: 13.3 10*3/uL — ABNORMAL HIGH (ref 4.0–10.5)

## 2014-02-05 LAB — HEMOGLOBIN A1C
Hgb A1c MFr Bld: 5.9 % — ABNORMAL HIGH (ref <5.7)
Mean Plasma Glucose: 123 mg/dL — ABNORMAL HIGH (ref <117)

## 2014-02-05 LAB — MAGNESIUM: MAGNESIUM: 1.8 mg/dL (ref 1.5–2.5)

## 2014-02-05 LAB — PROCALCITONIN

## 2014-02-05 LAB — CLOSTRIDIUM DIFFICILE BY PCR: Toxigenic C. Difficile by PCR: NEGATIVE

## 2014-02-05 LAB — PROTIME-INR
INR: 1.07 (ref 0.00–1.49)
Prothrombin Time: 14 seconds (ref 11.6–15.2)

## 2014-02-05 MED ORDER — BACLOFEN 10 MG PO TABS
10.0000 mg | ORAL_TABLET | Freq: Three times a day (TID) | ORAL | Status: DC | PRN
  Filled 2014-02-05: qty 1

## 2014-02-05 MED ORDER — SALINE SPRAY 0.65 % NA SOLN
1.0000 | NASAL | Status: DC | PRN
  Administered 2014-02-06 (×3): 1 via NASAL
  Filled 2014-02-05: qty 44

## 2014-02-05 MED ORDER — DEXTROSE 5 % IV SOLN
1.0000 g | Freq: Every day | INTRAVENOUS | Status: DC
  Administered 2014-02-05 – 2014-02-06 (×3): 1 g via INTRAVENOUS
  Filled 2014-02-05 (×4): qty 10

## 2014-02-05 MED ORDER — SODIUM CHLORIDE 0.9 % IJ SOLN
3.0000 mL | Freq: Two times a day (BID) | INTRAMUSCULAR | Status: DC
  Administered 2014-02-05 – 2014-02-07 (×6): 3 mL via INTRAVENOUS

## 2014-02-05 MED ORDER — ALBUTEROL SULFATE (2.5 MG/3ML) 0.083% IN NEBU: 2.5000 mg | INHALATION_SOLUTION | RESPIRATORY_TRACT | Status: DC | PRN

## 2014-02-05 MED ORDER — ACETAMINOPHEN 325 MG PO TABS
650.0000 mg | ORAL_TABLET | Freq: Four times a day (QID) | ORAL | Status: DC | PRN
  Administered 2014-02-06: 650 mg via ORAL
  Filled 2014-02-05 (×2): qty 2

## 2014-02-05 MED ORDER — ONDANSETRON HCL 4 MG/2ML IJ SOLN: 4.0000 mg | Freq: Four times a day (QID) | INTRAMUSCULAR | Status: DC | PRN

## 2014-02-05 MED ORDER — DOCUSATE SODIUM 100 MG PO CAPS
100.0000 mg | ORAL_CAPSULE | Freq: Two times a day (BID) | ORAL | Status: DC
  Administered 2014-02-05 – 2014-02-07 (×3): 100 mg via ORAL
  Filled 2014-02-05 (×7): qty 1

## 2014-02-05 MED ORDER — AMLODIPINE BESYLATE 10 MG PO TABS
10.0000 mg | ORAL_TABLET | Freq: Every day | ORAL | Status: DC
  Administered 2014-02-05 – 2014-02-07 (×3): 10 mg via ORAL
  Filled 2014-02-05 (×3): qty 1

## 2014-02-05 MED ORDER — PANTOPRAZOLE SODIUM 40 MG PO TBEC: 40.0000 mg | DELAYED_RELEASE_TABLET | Freq: Every day | ORAL | Status: DC

## 2014-02-05 MED ORDER — HYDROCODONE-ACETAMINOPHEN 5-325 MG PO TABS: 1.0000 | ORAL_TABLET | ORAL | Status: DC | PRN

## 2014-02-05 MED ORDER — ACETAMINOPHEN 650 MG RE SUPP: 650.0000 mg | Freq: Four times a day (QID) | RECTAL | Status: DC | PRN

## 2014-02-05 MED ORDER — ONDANSETRON HCL 4 MG PO TABS: 4.0000 mg | ORAL_TABLET | Freq: Four times a day (QID) | ORAL | Status: DC | PRN

## 2014-02-05 MED ORDER — PANTOPRAZOLE SODIUM 40 MG PO TBEC
40.0000 mg | DELAYED_RELEASE_TABLET | Freq: Every day | ORAL | Status: DC
  Administered 2014-02-05 – 2014-02-07 (×3): 40 mg via ORAL
  Filled 2014-02-05 (×3): qty 1

## 2014-02-05 MED ORDER — SODIUM CHLORIDE 0.9 % IV SOLN
INTRAVENOUS | Status: AC
  Administered 2014-02-05: 03:00:00 via INTRAVENOUS

## 2014-02-05 MED ORDER — POTASSIUM CHLORIDE CRYS ER 20 MEQ PO TBCR
40.0000 meq | EXTENDED_RELEASE_TABLET | Freq: Once | ORAL | Status: AC
  Administered 2014-02-05: 40 meq via ORAL
  Filled 2014-02-05: qty 2

## 2014-02-05 NOTE — Progress Notes (Signed)
Attempted to call report but RN unavailable at this time.RN to call. Daimien Patmon, Drinda Buttsharito Joselita, RCharity fundraiser

## 2014-02-05 NOTE — Progress Notes (Signed)
New Admission Note:   Arrival Method: Via stretcher from ED Mental Orientation: Alert and oriented to person,place and situation,disoriented to time Telemetry: Box #[id number]-Sinus Tach Assessment: Completed Skin: Redness on sacrum,blanchable                                                                                                                           IV: Rt AC-NS @75  Pain: Denies Tubes: N/A Safety Measures: Safety Fall Prevention Plan has been given, discussed and signed Admission: Completed [address] Orientation: Patient has been orientated to the room, unit and staff.  Family:  Orders have been reviewed and implemented. Will continue to monitor the patient. Call light has been placed within reach and bed alarm has been activated.   Toll BrothersCharito Alesha Jaffee BSN, RN-BC Phone number: [phone]

## 2014-02-05 NOTE — Progress Notes (Signed)
68 year old lady admitted for dehydration and confusion probably secondary to UTI. Started her on rocephin.   Monitor.   Kathlen ModyVijaya Una Yeomans, MD [phone]

## 2014-02-06 LAB — HEPATIC FUNCTION PANEL
ALK PHOS: 72 U/L (ref 39–117)
ALT: 63 U/L — AB (ref 0–35)
AST: 119 U/L — AB (ref 0–37)
Albumin: 2.8 g/dL — ABNORMAL LOW (ref 3.5–5.2)
Bilirubin, Direct: <0.2 mg/dL (ref 0.0–0.3)
TOTAL PROTEIN: 7.3 g/dL (ref 6.0–8.3)
Total Bilirubin: 0.7 mg/dL (ref 0.3–1.2)

## 2014-02-06 LAB — CBC
HCT: 40.8 % (ref 36.0–46.0)
HEMOGLOBIN: 13.2 g/dL (ref 12.0–15.0)
MCH: 28.4 pg (ref 26.0–34.0)
MCHC: 32.4 g/dL (ref 30.0–36.0)
MCV: 87.7 fL (ref 78.0–100.0)
Platelets: 274 10*3/uL (ref 150–400)
RBC: 4.65 MIL/uL (ref 3.87–5.11)
RDW: 15.3 % (ref 11.5–15.5)
WBC: 12.1 10*3/uL — ABNORMAL HIGH (ref 4.0–10.5)

## 2014-02-06 LAB — BASIC METABOLIC PANEL
Anion gap: 18 — ABNORMAL HIGH (ref 5–15)
BUN: 13 mg/dL (ref 6–23)
CALCIUM: 7.9 mg/dL — AB (ref 8.4–10.5)
CO2: 14 meq/L — AB (ref 19–32)
CREATININE: 0.75 mg/dL (ref 0.50–1.10)
Chloride: 106 mEq/L (ref 96–112)
GFR calc Af Amer: >90 mL/min (ref >90)
GFR calc non Af Amer: 86 mL/min — ABNORMAL LOW (ref >90)
GLUCOSE: 115 mg/dL — AB (ref 70–99)
Potassium: 3.6 mEq/L — ABNORMAL LOW (ref 3.7–5.3)
Sodium: 138 mEq/L (ref 137–147)

## 2014-02-06 LAB — URINE CULTURE
Colony Count: >=100000
Special Requests: NORMAL

## 2014-02-06 LAB — PROCALCITONIN: Procalcitonin: <0.1 ng/mL

## 2014-02-06 MED ORDER — POTASSIUM CHLORIDE CRYS ER 20 MEQ PO TBCR
40.0000 meq | EXTENDED_RELEASE_TABLET | Freq: Two times a day (BID) | ORAL | Status: AC
  Administered 2014-02-06 – 2014-02-07 (×2): 40 meq via ORAL
  Filled 2014-02-06 (×2): qty 2

## 2014-02-06 MED ORDER — WHITE PETROLATUM GEL
Status: AC
  Administered 2014-02-06: 0.2
  Filled 2014-02-06: qty 5

## 2014-02-06 NOTE — Care Management Note (Signed)
CARE MANAGEMENT NOTE 02/06/2014  Patient:  Monica Conway, Monica Conway   Account Number:  [id number]  Date Initiated:  02/06/2014  Documentation initiated by:  Sai Moura  Subjective/Objective Assessment:   CM following for progression and d/c planning.     Action/Plan:   02/06/2014 Met with pt per her request for questions about insurance , explained to pt that we are unable to tell her how much her insurance will pay. Please see below.   Anticipated DC Date:     Anticipated DC Plan:           Choice offered to / List presented to:             Status of service:   Medicare Important Message given?   (If response is "NO", the following Medicare IM given date fields will be blank) Date Medicare IM given:   Medicare IM given by:   Date Additional Medicare IM given:   Additional Medicare IM given by:    Discharge Disposition:    Per UR Regulation:    If discussed at Long Length of Stay Meetings, dates discussed:    Comments:  02/06/14 Pt stating that her MD is sending her to rehab, however there are no notes indicating this and no orders, this CM discussed with charge nurse and the chg RN will order PT/OT eval and notify pt MD. This pt must have this eval to qualify for any kind of rehab as inpt or with [facility]. CRoyal RN MPH, case manager, [phone]
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
## 2014-02-06 NOTE — Clinical Social Work Psychosocial (Signed)
Clinical Social Work Department BRIEF PSYCHOSOCIAL ASSESSMENT 02/06/2014  Patient:  Monica Conway,Temple L     Account Number:  [id number]     Admit date:  02/04/2014  Clinical Social Worker:  Delmer IslamRAWFORD,Candence Sease, LCSW  Date/Time:  02/06/2014 03:39 AM  Referred by:  Physician  Date Referred:  02/06/2014 Referred for  SNF Placement   Other Referral:   Interview type:  Patient Other interview type:    PSYCHOSOCIAL DATA Living Status:  ALONE Admitted from facility:   Level of care:   Primary support name:  Casper Harrisonobert Poston Primary support relationship to patient:  SIBLING Degree of support available:   Good support according to patient.    CURRENT CONCERNS Current Concerns  Post-Acute Placement   Other Concerns:    SOCIAL WORK ASSESSMENT / PLAN CSW talked with patient regarding MD/PT recommendation of short-term rehab. Patient had many questions regarding how much insurance would pay, the rooms in a facility, if phones were available, etc. and her questions and concerns were addressed. Ms. Katrinka BlazingSmith stated that she knew nothing about skilled facilities and CSW encouraged her to talk with her brother and allow him to assist her by visiting SNF's and helping her make the decision. Patient is in agreement with going to rehab but is concerned about choosing a facility and CSW voiced understanding and continued to suggest to patient that she talk with her brother.   Assessment/plan status:  Psychosocial Support/Ongoing Assessment of Needs Other assessment/ plan:   Information/referral to community resources:   [place] [facility] [place] [facility] [place]    PATIENT'S/FAMILY'S RESPONSE TO PLAN OF CARE: Patient receptive to talking with CSW and in agreement with short-term rehab.    Genelle BalVanessa Zuleyka Kloc, MSW, LCSW Clinical Social Work Department [facility] [phone]

## 2014-02-06 NOTE — Evaluation (Signed)
Physical Therapy Evaluation Patient Details Name: Monica Conway MRN: [id number] DOB: January 26, 1946 Today's Date: 02/06/2014   History of Present Illness  Pt presents with UTI, AMS, h/o HTN, OA.  Clinical Impression  Pt admitted with above diagnosis. Pt currently with functional limitations due to the deficits listed below (see PT Problem List). Pt very unsteady with ambulation, high fall risk. Pt agreeable to SNF for rehab before going home as she lives alone. Pt will benefit from skilled PT to increase their independence and safety with mobility to allow discharge to the venue listed below.        Follow Up Recommendations SNF;Supervision for mobility/OOB    Equipment Recommendations  None recommended by PT    Recommendations for Other Services       Precautions / Restrictions Precautions Precautions: Fall Precaution Comments: no h/o falls before this event, but very unsteady Restrictions Weight Bearing Restrictions: No      Mobility  Bed Mobility Overal bed mobility: Modified Independent                Transfers Overall transfer level: Needs assistance Equipment used: None Transfers: Sit to/from Stand Sit to Stand: Min assist         General transfer comment: pt unsteady with standing, LOB with min A to correct  Ambulation/Gait Ambulation/Gait assistance: Min assist Ambulation Distance (Feet): 125 Feet Assistive device: None Gait Pattern/deviations: Step-through pattern;Staggering left;Staggering right Gait velocity: decreased Gait velocity interpretation: Below normal speed for age/gender General Gait Details: pt very unsteady with ambulation, LOB multiple times with min A to correct, staggering to right and left and had to stop 3x to get her balance before continuing. This is a significant change from her baseline  Information systems managertairs            Wheelchair Mobility    Modified Rankin (Stroke Patients Only)       Balance Overall balance assessment: Needs  assistance Sitting-balance support: No upper extremity supported;Feet supported Sitting balance-Leahy Scale: Good Sitting balance - Comments: no issues in sitting   Standing balance support: No upper extremity supported Standing balance-Leahy Scale: Fair Standing balance comment: pt can gain balance in static stance but when she begins to move, loses balance to both sides. denies dizziness.                              Pertinent Vitals/Pain Pain Assessment: No/denies pain    Home Living Family/patient expects to be discharged to:: Private residence Living Arrangements: Alone Available Help at Discharge: Family;Available PRN/intermittently Type of Home: House Home Access: Stairs to enter   [employer]ance Stairs-Number of Steps: 2 Home Layout: One level Home Equipment: None Additional Comments: pt cleans a building downtime by herself, does her own cooking/ cleaning/ home mgmt    Prior Function Level of Independence: Independent               Hand Dominance        Extremity/Trunk Assessment   Upper Extremity Assessment: Overall WFL for tasks assessed           Lower Extremity Assessment: Generalized weakness      Cervical / Trunk Assessment: Normal  Communication   Communication: No difficulties  Cognition Arousal/Alertness: Awake/alert Behavior During Therapy: WFL for tasks assessed/performed Overall Cognitive Status: Within Functional Limits for tasks assessed                      General  Comments      Exercises Other Exercises Other Exercises: reviewed general LE strengthening exercises      Assessment/Plan    PT Assessment Patient needs continued PT services  PT Diagnosis Abnormality of gait;Difficulty walking;Generalized weakness   PT Problem List Decreased strength;Decreased activity tolerance;Decreased balance;Decreased mobility;Decreased knowledge of use of DME;Decreased knowledge of precautions  PT Treatment Interventions  DME instruction;Gait training;Functional mobility training;Therapeutic activities;Therapeutic exercise;Balance training;Patient/family education   PT Goals (Current goals can be found in the Care Plan section) Acute Rehab PT Goals Patient Stated Goal: return to home and work PT Goal Formulation: With patient Time For Goal Achievement: 02/20/14 Potential to Achieve Goals: Good    Frequency Min 3X/week   Barriers to discharge Decreased caregiver support lives alone    Co-evaluation               End of Session Equipment Utilized During Treatment: Gait belt Activity Tolerance: Patient tolerated treatment well Patient left: in bed;with call bell/phone within reach;with family/visitor present;with bed alarm set Nurse Communication: Mobility status         Time: [id number] PT Time Calculation (min) (ACUTE ONLY): 24 min   Charges:   PT Evaluation $Initial PT Evaluation Tier I: 1 Procedure PT Treatments $Gait Training: 8-22 mins $Therapeutic Activity: 8-22 mins   PT G Codes:        Monica Conway, PT  Acute Rehab Services  [phone]   Monica CoManess, Monica Conway 02/06/2014, 1:36 PM

## 2014-02-07 LAB — COMPREHENSIVE METABOLIC PANEL
ALT: 56 U/L — ABNORMAL HIGH (ref 0–35)
AST: 107 U/L — AB (ref 0–37)
Albumin: 3 g/dL — ABNORMAL LOW (ref 3.5–5.2)
Alkaline Phosphatase: 70 U/L (ref 39–117)
Anion gap: 15 (ref 5–15)
BUN: 11 mg/dL (ref 6–23)
CALCIUM: 7.7 mg/dL — AB (ref 8.4–10.5)
CO2: 16 mEq/L — ABNORMAL LOW (ref 19–32)
CREATININE: 0.54 mg/dL (ref 0.50–1.10)
Chloride: 105 mEq/L (ref 96–112)
GFR calc non Af Amer: >90 mL/min (ref >90)
Glucose, Bld: 114 mg/dL — ABNORMAL HIGH (ref 70–99)
Potassium: 5.1 mEq/L (ref 3.7–5.3)
Sodium: 136 mEq/L — ABNORMAL LOW (ref 137–147)
Total Bilirubin: 0.5 mg/dL (ref 0.3–1.2)
Total Protein: 7.7 g/dL (ref 6.0–8.3)

## 2014-02-07 MED ORDER — LEVOFLOXACIN 500 MG PO TABS: 500.0000 mg | ORAL_TABLET | Freq: Every day | ORAL | Status: DC
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
## 2014-02-07 NOTE — Care Management Note (Signed)
CARE MANAGEMENT NOTE 02/07/2014  Patient:  Monica Conway, Monica Conway   Account Number:  [id number]  Date Initiated:  02/06/2014  Documentation initiated by:  Zakariah Dejarnette  Subjective/Objective Assessment:   CM following for progression and d/c planning.     Action/Plan:   02/06/2014 Met with pt per her request for questions about insurance , explained to pt that we are unable to tell her how much her insurance will pay. Please see below.   Anticipated DC Date:  02/07/2014   Anticipated DC Plan:  SKILLED NURSING FACILITY         Choice offered to / List presented to:             Status of service:  Completed, signed off Medicare Important Message given?  YES (If response is "NO", the following Medicare IM given date fields will be blank) Date Medicare IM given:  02/07/2014 Medicare IM given by:  Lianna Sitzmann Date Additional Medicare IM given:   Additional Medicare IM given by:    Discharge Disposition:  [place]  Per UR Regulation:    If discussed at Long Length of Stay Meetings, dates discussed:    Comments:  02/06/14 Pt stating that her MD is sending her to rehab, however there are no notes indicating this and no orders, this CM discussed with charge nurse and the chg RN will order PT/OT eval and notify pt MD. This pt must have this eval to qualify for any kind of rehab as inpt or with [facility]. CRoyal RN MPH, case manager, [phone]

## 2014-02-07 NOTE — Progress Notes (Signed)
UR Completed.  [phone]  

## 2014-02-07 NOTE — Clinical Social Work Placement (Signed)
Clinical Social Work Department CLINICAL SOCIAL WORK PLACEMENT NOTE 02/07/2014  Patient:  Monica LuoSMITH,Kennice L  Account Number:  [id number] Admit date:  02/04/2014  Clinical Social Worker:  Genelle BalVANESSA Lucielle Vokes, LCSW  Date/time:  02/07/2014 03:37 AM  Clinical Social Work is seeking post-discharge placement for this patient at the following level of care:   SKILLED NURSING   (*CSW will update this form in Epic as items are completed)   02/06/2014  Patient/family provided with [facility] Department of Clinical Social Work's list of facilities offering this level of care within the geographic area requested by the patient (or if unable, by the patient's family).  02/06/2014  Patient/family informed of their freedom to choose among providers that offer the needed level of care, that participate in Medicare, Medicaid or managed care program needed by the patient, have an available bed and are willing to accept the patient.    Patient/family informed of [facility]' ownership interest in [facility], as well as of the fact that they are under no obligation to receive care at this facility.  PASARR submitted to EDS on 02/06/2014 PASARR number received on 02/06/2014  FL2 transmitted to all facilities in geographic area requested by pt/family on  02/06/2014 FL2 transmitted to all facilities within larger geographic area on   Patient informed that his/her managed care company has contracts with or will negotiate with  certain facilities, including the following:     Patient/family informed of bed offers received:  02/07/2014 [facility] Physician recommends and patient chooses bed at    [facility] on  02/07/2014 Patient to be transferred to facility by Family Patient and family notified of transfer on 02/07/2014 Name of family member notified:  Casper Harrisonobert Poston, brother  The following physician request were entered in  Epic:   Additional Comments: 02/07/14 - [facility]    Genelle BalVanessa Allura Doepke, MSW, LCSW Clinical Social Work Department [facility] [phone]

## 2014-02-10 ENCOUNTER — Encounter: Payer: Self-pay | Admitting: Internal Medicine

## 2014-02-10 ENCOUNTER — Non-Acute Institutional Stay (SKILLED_NURSING_FACILITY): Payer: Medicare HMO | Admitting: Internal Medicine

## 2014-02-10 DIAGNOSIS — D509 Iron deficiency anemia, unspecified: Secondary | ICD-10-CM

## 2014-02-10 DIAGNOSIS — I10 Essential (primary) hypertension: Secondary | ICD-10-CM

## 2014-02-10 DIAGNOSIS — B3731 Acute candidiasis of vulva and vagina: Secondary | ICD-10-CM

## 2014-02-10 DIAGNOSIS — R5381 Other malaise: Secondary | ICD-10-CM

## 2014-02-10 DIAGNOSIS — B373 Candidiasis of vulva and vagina: Secondary | ICD-10-CM

## 2014-02-10 DIAGNOSIS — N390 Urinary tract infection, site not specified: Secondary | ICD-10-CM

## 2014-02-10 DIAGNOSIS — K219 Gastro-esophageal reflux disease without esophagitis: Secondary | ICD-10-CM

## 2014-02-10 NOTE — Progress Notes (Signed)
Patient ID: Monica Conway, female   DOB: 24-Sep-1945, 68 y.o.   MRN: [id number]     [facility]  Code Status: DNR  Allergies  Allergen Reactions  . Pollen Extract Shortness Of Breath    Chief Complaint  Patient presents with  . New Admit To SNF     HPI:  68 y/o female patient is here for STR post hospital admission from 02/04/14-02/07/14 with confusion, hypovolemia and UTI. She was started on iv rocephin and later switched to levofloxacin.  She has PMH of hyertension, asthma. She is seen in her room today. She complaints of burning and itching in her vaginal area. She denies any vaginal discharge. Denies dysuria or flank pain. No other concerns  Review of Systems:  Constitutional: Negative for fever, chills, malaise/fatigue and diaphoresis.  HENT: Negative for congestion  Respiratory: Negative for cough, shortness of breath Cardiovascular: Negative for chest pain, palpitations, leg swelling.  Gastrointestinal: Negative for heartburn, nausea, vomiting, abdominal pain, diarrhea and constipation. appetite is good Genitourinary: Negative for dysuria Musculoskeletal: Negative for back pain, falls Skin: Negative for rash.  Neurological: Negative for weakness and headaches.  Psychiatric/Behavioral: Negative for depression  Past Medical History  Diagnosis Date  . Arthritis   . Asthma   . Blood transfusion without reported diagnosis   . Hypertension    Past Surgical History  Procedure Laterality Date  . Colon surgery    . Tubal ligation     Social History:   reports that she has never smoked. She has never used smokeless tobacco. She reports that she does not drink alcohol or use illicit drugs.  Family History  Problem Relation Age of Onset  . Cataracts Sister   . Hypertension Sister   . Hypertension Brother     Medications: Patient's Medications  New Prescriptions   No medications on file  Previous Medications   ACETAMINOPHEN  (TYLENOL) 500 MG TABLET    Take 500 mg by mouth every morning.   AMLODIPINE (NORVASC) 10 MG TABLET    Take 10 mg by mouth daily.   BACLOFEN (LIORESAL) 10 MG TABLET    Take 10 mg by mouth as needed for muscle spasms.    CHOLECALCIFEROL 1000 UNITS CAPSULE    Take 1,000 Units by mouth daily.   DENOSUMAB (PROLIA) 60 MG/ML SOLN INJECTION    Inject 60 mg into the skin every 6 (six) months. Administer in upper arm, thigh, or abdomen   FERROUS SULFATE 325 (65 FE) MG EC TABLET    Take 325 mg by mouth daily with breakfast.   LEVOFLOXACIN (LEVAQUIN) 500 MG TABLET    Take 1 tablet (500 mg total) by mouth daily.   MAGNESIUM OXIDE (MAG-OX) 400 MG TABLET    Take 400 mg by mouth 2 (two) times daily.   MELOXICAM (MOBIC) 15 MG TABLET    Take 15 mg by mouth daily.   MULTIPLE VITAMIN (MULTIVITAMIN WITH MINERALS) TABS TABLET    Take 1 tablet by mouth daily.   OMEGA-3 FATTY ACIDS (OMEGA-3 FISH OIL PO)    Take 15 mLs by mouth daily.   OMEPRAZOLE (PRILOSEC) 20 MG CAPSULE    Take 20 mg by mouth daily.   ZOLEDRONIC ACID (RECLAST) 5 MG/100ML SOLN INJECTION    Inject 5 mg into the vein once.  Modified Medications   No medications on file  Discontinued Medications   No medications on file     Physical Exam: Filed Vitals:   02/10/14 1145  BP: 127/84  Pulse: 89  Temp: 96.9 F (36.1 C)  Resp: 16  SpO2: 97%    General- elderly female in no acute distress Head- atraumatic, normocephalic Eyes- PERRLA, EOMI, no pallor, no icterus, no discharge Neck- no cervical lymphadenopathy Throat- moist mucus membrane Cardiovascular- normal s1,s2, no murmurs, palpable dorsalis pedis Respiratory- bilateral clear to auscultation, no wheeze, no rhonchi, no crackles Abdomen- bowel sounds present, soft, non tender, no CVA tenderness Musculoskeletal- able to move all 4 extremities, trace leg edema Neurological- no focal deficit Skin- warm and dry Psychiatry- alert and oriented to person, place and time, normal mood and  affect  Labs reviewed: Basic Metabolic Panel:  Recent Labs  16/11/9610/13/15 0500 02/06/14 1052 02/07/14 1140  NA 138 138 136*  K 3.4* 3.6* 5.1  CL 103 106 105  CO2 16* 14* 16*  GLUCOSE 95 115* 114*  BUN 38* 13 11  CREATININE 1.04 0.75 0.54  CALCIUM 7.4* 7.9* 7.7*  MG 1.8  --   --   PHOS 3.0  --   --    Liver Function Tests:  Recent Labs  02/05/14 0500 02/06/14 1052 02/07/14 1140  AST 171* 119* 107*  ALT 60* 63* 56*  ALKPHOS 81 72 70  BILITOT 0.8 0.7 0.5  PROT 7.2 7.3 7.7  ALBUMIN 3.2* 2.8* 3.0*   No results for input(s): LIPASE, AMYLASE in the last 8760 hours.  Recent Labs  02/04/14 2347  AMMONIA 33   CBC:  Recent Labs  02/04/14 2032 02/05/14 0500 02/06/14 1052  WBC 16.3* 13.3* 12.1*  NEUTROABS 13.1*  --   --   HGB 13.6 12.7 13.2  HCT 41.5 39.7 40.8  MCV 89.2 89.0 87.7  PLT 317 272 274   Cardiac Enzymes:  Recent Labs  02/05/14 0500 02/05/14 1055 02/05/14 1645  TROPONINI <0.30 <0.30 <0.30    Assessment/Plan  Physical deconditioning Has made improvement working with therapy team. Will have patient work with PT/OT as tolerated to regain strength and restore function.  Fall precautions are in place.  UTI Complete course of levaquin until 02/12/14, encouraged hydration  Vaginal candidiasis Start fluconazole 150 mg  x1 today and reassess. Encouraged hydration  HTN bp stable, continue norvasc 10 mg daily  Iron def anemia Continue ferrous sulfate 325 mg daily, monitor cbc  gerd Continue prilosec, symptoms controlled   Goals of care: short term rehabilitation  Labs/tests ordered: none  Family/ staff Communication: reviewed care plan with patient and nursing supervisor     Oneal GroutMAHIMA Cailah Reach, MD  [facility] [phone] (Monday-Friday 8 am - 5 pm) [phone] (afterhours)

## 2014-02-11 LAB — CULTURE, BLOOD (ROUTINE X 2)
CULTURE: NO GROWTH
Culture: NO GROWTH

## 2014-02-13 ENCOUNTER — Non-Acute Institutional Stay (SKILLED_NURSING_FACILITY): Payer: Medicare HMO | Admitting: Adult Health

## 2014-02-13 ENCOUNTER — Encounter: Payer: Self-pay | Admitting: Adult Health

## 2014-02-13 DIAGNOSIS — I10 Essential (primary) hypertension: Secondary | ICD-10-CM

## 2014-02-13 DIAGNOSIS — R5381 Other malaise: Secondary | ICD-10-CM

## 2014-02-13 DIAGNOSIS — D509 Iron deficiency anemia, unspecified: Secondary | ICD-10-CM

## 2014-02-13 DIAGNOSIS — N390 Urinary tract infection, site not specified: Secondary | ICD-10-CM

## 2014-02-13 DIAGNOSIS — K219 Gastro-esophageal reflux disease without esophagitis: Secondary | ICD-10-CM
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
## 2014-06-12 ENCOUNTER — Other Ambulatory Visit: Payer: Self-pay

## 2014-06-12 DIAGNOSIS — Z1231 Encounter for screening mammogram for malignant neoplasm of breast: Secondary | ICD-10-CM

## 2014-07-12 ENCOUNTER — Ambulatory Visit
Admission: RE | Admit: 2014-07-12 | Discharge: 2014-07-12 | Disposition: A | Discharge status: Home / Self Care | Payer: Medicare HMO | Source: Ambulatory Visit

## 2014-07-12 ENCOUNTER — Ambulatory Visit: Payer: Medicare HMO

## 2014-07-12 DIAGNOSIS — Z1231 Encounter for screening mammogram for malignant neoplasm of breast: Secondary | ICD-10-CM

## 2015-08-03 ENCOUNTER — Inpatient Hospital Stay (HOSPITAL_COMMUNITY): Payer: Medicare HMO

## 2015-08-03 ENCOUNTER — Inpatient Hospital Stay (HOSPITAL_COMMUNITY)
Admission: EM | Admit: 2015-08-03 | Discharge: 2015-08-06 | DRG: 470 | Disposition: A | Discharge status: Skilled Nursing Facility | Payer: Medicare HMO | Attending: Internal Medicine | Admitting: Internal Medicine

## 2015-08-03 ENCOUNTER — Inpatient Hospital Stay (HOSPITAL_COMMUNITY): Payer: Medicare HMO | Admitting: Certified Registered Nurse Anesthetist

## 2015-08-03 ENCOUNTER — Emergency Department (HOSPITAL_COMMUNITY): Payer: Medicare HMO

## 2015-08-03 ENCOUNTER — Encounter (HOSPITAL_COMMUNITY): Payer: Self-pay | Admitting: Emergency Medicine

## 2015-08-03 ENCOUNTER — Encounter (HOSPITAL_COMMUNITY)
Admission: EM | Disposition: A | Discharge status: Skilled Nursing Facility | Payer: Self-pay | Source: Home / Self Care | Attending: Internal Medicine

## 2015-08-03 DIAGNOSIS — M810 Age-related osteoporosis without current pathological fracture: Secondary | ICD-10-CM | POA: Diagnosis present

## 2015-08-03 DIAGNOSIS — J45909 Unspecified asthma, uncomplicated: Secondary | ICD-10-CM | POA: Diagnosis present

## 2015-08-03 DIAGNOSIS — Z8249 Family history of ischemic heart disease and other diseases of the circulatory system: Secondary | ICD-10-CM | POA: Diagnosis not present

## 2015-08-03 DIAGNOSIS — S72002A Fracture of unspecified part of neck of left femur, initial encounter for closed fracture: Secondary | ICD-10-CM | POA: Diagnosis present

## 2015-08-03 DIAGNOSIS — Z419 Encounter for procedure for purposes other than remedying health state, unspecified: Secondary | ICD-10-CM

## 2015-08-03 DIAGNOSIS — W010XXA Fall on same level from slipping, tripping and stumbling without subsequent striking against object, initial encounter: Secondary | ICD-10-CM | POA: Diagnosis present

## 2015-08-03 DIAGNOSIS — Y92000 Kitchen of unspecified non-institutional (private) residence as  the place of occurrence of the external cause: Secondary | ICD-10-CM | POA: Diagnosis not present

## 2015-08-03 DIAGNOSIS — Z09 Encounter for follow-up examination after completed treatment for conditions other than malignant neoplasm: Secondary | ICD-10-CM

## 2015-08-03 DIAGNOSIS — S72012A Unspecified intracapsular fracture of left femur, initial encounter for closed fracture: Secondary | ICD-10-CM | POA: Diagnosis not present

## 2015-08-03 DIAGNOSIS — D62 Acute posthemorrhagic anemia: Secondary | ICD-10-CM | POA: Diagnosis present

## 2015-08-03 DIAGNOSIS — Z79899 Other long term (current) drug therapy: Secondary | ICD-10-CM | POA: Diagnosis not present

## 2015-08-03 DIAGNOSIS — M170 Bilateral primary osteoarthritis of knee: Secondary | ICD-10-CM | POA: Diagnosis present

## 2015-08-03 DIAGNOSIS — E869 Volume depletion, unspecified: Secondary | ICD-10-CM | POA: Diagnosis not present

## 2015-08-03 DIAGNOSIS — N179 Acute kidney failure, unspecified: Secondary | ICD-10-CM | POA: Diagnosis not present

## 2015-08-03 DIAGNOSIS — I10 Essential (primary) hypertension: Secondary | ICD-10-CM | POA: Diagnosis present

## 2015-08-03 DIAGNOSIS — M25552 Pain in left hip: Secondary | ICD-10-CM | POA: Diagnosis present

## 2015-08-03 HISTORY — PX: TOTAL HIP ARTHROPLASTY: SHX124

## 2015-08-03 HISTORY — DX: Age-related osteoporosis without current pathological fracture: M81.0

## 2015-08-03 HISTORY — DX: Fracture of unspecified part of neck of left femur, initial encounter for closed fracture: S72.002A

## 2015-08-03 LAB — BASIC METABOLIC PANEL
ANION GAP: 12 (ref 5–15)
BUN: 21 mg/dL — ABNORMAL HIGH (ref 6–20)
CALCIUM: 8.7 mg/dL — AB (ref 8.9–10.3)
CHLORIDE: 105 mmol/L (ref 101–111)
CO2: 17 mmol/L — ABNORMAL LOW (ref 22–32)
CREATININE: 1.07 mg/dL — AB (ref 0.44–1.00)
GFR calc non Af Amer: 52 mL/min — ABNORMAL LOW (ref >60)
GFR, EST AFRICAN AMERICAN: 60 mL/min — AB (ref >60)
Glucose, Bld: 115 mg/dL — ABNORMAL HIGH (ref 65–99)
Potassium: 3.7 mmol/L (ref 3.5–5.1)
SODIUM: 134 mmol/L — AB (ref 135–145)

## 2015-08-03 LAB — CBC WITH DIFFERENTIAL/PLATELET
Basophils Absolute: 0 10*3/uL (ref 0.0–0.1)
Basophils Relative: 0 %
EOS ABS: 0 10*3/uL (ref 0.0–0.7)
EOS PCT: 0 %
HCT: 38 % (ref 36.0–46.0)
Hemoglobin: 12.5 g/dL (ref 12.0–15.0)
LYMPHS PCT: 15 %
Lymphs Abs: 1.8 10*3/uL (ref 0.7–4.0)
MCH: 29.1 pg (ref 26.0–34.0)
MCHC: 32.9 g/dL (ref 30.0–36.0)
MCV: 88.4 fL (ref 78.0–100.0)
MONO ABS: 0.2 10*3/uL (ref 0.1–1.0)
Monocytes Relative: 2 %
NEUTROS PCT: 83 %
Neutro Abs: 9.8 10*3/uL — ABNORMAL HIGH (ref 1.7–7.7)
PLATELETS: 314 10*3/uL (ref 150–400)
RBC: 4.3 MIL/uL (ref 3.87–5.11)
RDW: 15.6 % — ABNORMAL HIGH (ref 11.5–15.5)
WBC: 11.8 10*3/uL — AB (ref 4.0–10.5)

## 2015-08-03 LAB — MRSA PCR SCREENING: MRSA by PCR: NEGATIVE

## 2015-08-03 LAB — SODIUM, URINE, RANDOM: Sodium, Ur: 75 mmol/L

## 2015-08-03 LAB — CREATININE, URINE, RANDOM: Creatinine, Urine: 68.54 mg/dL

## 2015-08-03 LAB — PROTIME-INR
INR: 0.9 (ref 0.00–1.49)
PROTHROMBIN TIME: 12.4 s (ref 11.6–15.2)

## 2015-08-03 LAB — PROTEIN, URINE, RANDOM: Total Protein, Urine: 109 mg/dL

## 2015-08-03 SURGERY — ARTHROPLASTY, HIP, TOTAL, ANTERIOR APPROACH
Anesthesia: Monitor Anesthesia Care | Laterality: Left

## 2015-08-03 MED ORDER — POVIDONE-IODINE 10 % EX SWAB
2.0000 "application " | Freq: Once | CUTANEOUS | Status: AC
  Administered 2015-08-03: 2 via TOPICAL

## 2015-08-03 MED ORDER — BUPIVACAINE-EPINEPHRINE (PF) 0.25% -1:200000 IJ SOLN
INTRAMUSCULAR | Status: AC
  Filled 2015-08-03: qty 30

## 2015-08-03 MED ORDER — CEFAZOLIN SODIUM-DEXTROSE 2-4 GM/100ML-% IV SOLN
INTRAVENOUS | Status: AC
  Filled 2015-08-03: qty 100

## 2015-08-03 MED ORDER — OXYCODONE HCL 5 MG/5ML PO SOLN
5.0000 mg | Freq: Once | ORAL | Status: DC | PRN
  Filled 2015-08-03: qty 5

## 2015-08-03 MED ORDER — TRANEXAMIC ACID 1000 MG/10ML IV SOLN
1000.0000 mg | INTRAVENOUS | Status: AC
  Administered 2015-08-03: 1000 mg via INTRAVENOUS
  Filled 2015-08-03: qty 10

## 2015-08-03 MED ORDER — LIP MEDEX EX OINT
TOPICAL_OINTMENT | CUTANEOUS | Status: AC
  Administered 2015-08-03: 11:00:00
  Filled 2015-08-03: qty 7

## 2015-08-03 MED ORDER — MORPHINE SULFATE (PF) 2 MG/ML IV SOLN
INTRAVENOUS | Status: AC
  Filled 2015-08-03: qty 1

## 2015-08-03 MED ORDER — ENOXAPARIN SODIUM 40 MG/0.4ML ~~LOC~~ SOLN: 40.0000 mg | Freq: Every day | SUBCUTANEOUS | Status: DC

## 2015-08-03 MED ORDER — PANTOPRAZOLE SODIUM 40 MG PO TBEC
40.0000 mg | DELAYED_RELEASE_TABLET | Freq: Every day | ORAL | Status: DC
  Administered 2015-08-04: 40 mg via ORAL
  Filled 2015-08-03: qty 1

## 2015-08-03 MED ORDER — ONDANSETRON HCL 4 MG/2ML IJ SOLN: 4.0000 mg | Freq: Four times a day (QID) | INTRAMUSCULAR | Status: DC | PRN

## 2015-08-03 MED ORDER — PHENYLEPHRINE 40 MCG/ML (10ML) SYRINGE FOR IV PUSH (FOR BLOOD PRESSURE SUPPORT)
PREFILLED_SYRINGE | INTRAVENOUS | Status: AC
  Filled 2015-08-03: qty 10

## 2015-08-03 MED ORDER — PROPOFOL 10 MG/ML IV BOLUS
INTRAVENOUS | Status: AC
  Filled 2015-08-03: qty 20

## 2015-08-03 MED ORDER — SODIUM CHLORIDE 0.9 % IV SOLN
INTRAVENOUS | Status: DC
  Administered 2015-08-03: 12:00:00 via INTRAVENOUS

## 2015-08-03 MED ORDER — KETOROLAC TROMETHAMINE 30 MG/ML IJ SOLN
INTRAMUSCULAR | Status: DC | PRN
  Administered 2015-08-03: 30 mg via INTRAVENOUS

## 2015-08-03 MED ORDER — ACETAMINOPHEN 160 MG/5ML PO SOLN: 325.0000 mg | ORAL | Status: DC | PRN

## 2015-08-03 MED ORDER — SORBITOL 70 % SOLN: 30.0000 mL | Freq: Every day | Status: DC | PRN

## 2015-08-03 MED ORDER — FENTANYL CITRATE (PF) 100 MCG/2ML IJ SOLN: 25.0000 ug | INTRAMUSCULAR | Status: DC | PRN

## 2015-08-03 MED ORDER — PHENYLEPHRINE HCL 10 MG/ML IJ SOLN
INTRAMUSCULAR | Status: DC | PRN
  Administered 2015-08-03: 160 ug via INTRAVENOUS
  Administered 2015-08-03: 20 ug via INTRAVENOUS
  Administered 2015-08-03: 120 ug via INTRAVENOUS
  Administered 2015-08-03: 80 ug via INTRAVENOUS
  Administered 2015-08-03: 120 ug via INTRAVENOUS

## 2015-08-03 MED ORDER — KETAMINE HCL 100 MG/ML IJ SOLN
INTRAMUSCULAR | Status: DC | PRN
  Administered 2015-08-03 (×2): 20 mg via INTRAVENOUS

## 2015-08-03 MED ORDER — HYDROCODONE-ACETAMINOPHEN 5-325 MG PO TABS
1.0000 | ORAL_TABLET | Freq: Four times a day (QID) | ORAL | Status: DC | PRN
  Administered 2015-08-04 (×3): 2 via ORAL
  Administered 2015-08-05: 1 via ORAL
  Administered 2015-08-05 (×2): 2 via ORAL
  Administered 2015-08-05: 1 via ORAL
  Administered 2015-08-06 (×3): 2 via ORAL
  Filled 2015-08-03: qty 1
  Filled 2015-08-03: qty 2
  Filled 2015-08-03: qty 1
  Filled 2015-08-03 (×7): qty 2

## 2015-08-03 MED ORDER — OXYCODONE HCL 5 MG PO TABS: 5.0000 mg | ORAL_TABLET | Freq: Once | ORAL | Status: DC | PRN

## 2015-08-03 MED ORDER — CHLORHEXIDINE GLUCONATE 4 % EX LIQD
60.0000 mL | Freq: Once | CUTANEOUS | Status: AC
  Administered 2015-08-03: 4 via TOPICAL
  Filled 2015-08-03 (×2): qty 60

## 2015-08-03 MED ORDER — CEFAZOLIN SODIUM-DEXTROSE 2-4 GM/100ML-% IV SOLN
2.0000 g | Freq: Four times a day (QID) | INTRAVENOUS | Status: AC
  Administered 2015-08-03 – 2015-08-04 (×2): 2 g via INTRAVENOUS
  Filled 2015-08-03 (×2): qty 100

## 2015-08-03 MED ORDER — KETAMINE HCL 10 MG/ML IJ SOLN
INTRAMUSCULAR | Status: AC
  Filled 2015-08-03: qty 1

## 2015-08-03 MED ORDER — KETOROLAC TROMETHAMINE 30 MG/ML IJ SOLN
INTRAMUSCULAR | Status: AC
  Filled 2015-08-03: qty 1

## 2015-08-03 MED ORDER — MIDAZOLAM HCL 5 MG/5ML IJ SOLN
INTRAMUSCULAR | Status: DC | PRN
  Administered 2015-08-03 (×2): 1 mg via INTRAVENOUS

## 2015-08-03 MED ORDER — ISOPROPYL ALCOHOL 70 % SOLN
Status: DC | PRN
  Administered 2015-08-03: 1 via TOPICAL

## 2015-08-03 MED ORDER — BUPIVACAINE HCL (PF) 0.5 % IJ SOLN
INTRAMUSCULAR | Status: AC
  Filled 2015-08-03: qty 30

## 2015-08-03 MED ORDER — ACETAMINOPHEN 325 MG PO TABS: 650.0000 mg | ORAL_TABLET | Freq: Four times a day (QID) | ORAL | Status: DC | PRN

## 2015-08-03 MED ORDER — MENTHOL 3 MG MT LOZG
1.0000 | LOZENGE | OROMUCOSAL | Status: DC | PRN
  Filled 2015-08-03: qty 9

## 2015-08-03 MED ORDER — DEXTROSE 5 % IV SOLN
10.0000 mg | INTRAVENOUS | Status: DC | PRN
  Administered 2015-08-03: 30 ug/min via INTRAVENOUS

## 2015-08-03 MED ORDER — HYDROGEN PEROXIDE 3 % EX SOLN
CUTANEOUS | Status: AC
  Filled 2015-08-03: qty 473

## 2015-08-03 MED ORDER — SODIUM CHLORIDE 0.9 % IJ SOLN
INTRAMUSCULAR | Status: DC | PRN
  Administered 2015-08-03: 50 mL via INTRAVENOUS

## 2015-08-03 MED ORDER — HYDROGEN PEROXIDE 3 % EX SOLN
CUTANEOUS | Status: DC | PRN
  Administered 2015-08-03: 1

## 2015-08-03 MED ORDER — CEFAZOLIN SODIUM-DEXTROSE 2-4 GM/100ML-% IV SOLN
2.0000 g | INTRAVENOUS | Status: AC
  Administered 2015-08-03: 2 g via INTRAVENOUS
  Filled 2015-08-03: qty 100

## 2015-08-03 MED ORDER — ADULT MULTIVITAMIN W/MINERALS CH
1.0000 | ORAL_TABLET | Freq: Every day | ORAL | Status: DC
  Administered 2015-08-04 – 2015-08-06 (×3): 1 via ORAL
  Filled 2015-08-03 (×3): qty 1

## 2015-08-03 MED ORDER — CEFAZOLIN SODIUM 10 G IJ SOLR: 3.0000 g | INTRAMUSCULAR | Status: DC

## 2015-08-03 MED ORDER — SODIUM CHLORIDE 0.9 % IV SOLN
INTRAVENOUS | Status: DC
  Administered 2015-08-03: 22:00:00 via INTRAVENOUS

## 2015-08-03 MED ORDER — LACTATED RINGERS IV SOLN
INTRAVENOUS | Status: DC | PRN
  Administered 2015-08-03 (×2): via INTRAVENOUS

## 2015-08-03 MED ORDER — TRANEXAMIC ACID 1000 MG/10ML IV SOLN
1000.0000 mg | INTRAVENOUS | Status: DC
  Filled 2015-08-03: qty 10

## 2015-08-03 MED ORDER — METHOCARBAMOL 1000 MG/10ML IJ SOLN
500.0000 mg | Freq: Four times a day (QID) | INTRAVENOUS | Status: DC | PRN
  Filled 2015-08-03: qty 5

## 2015-08-03 MED ORDER — FENTANYL CITRATE (PF) 100 MCG/2ML IJ SOLN
INTRAMUSCULAR | Status: AC
  Filled 2015-08-03: qty 2

## 2015-08-03 MED ORDER — PHENOL 1.4 % MT LIQD
1.0000 | OROMUCOSAL | Status: DC | PRN
  Filled 2015-08-03: qty 177

## 2015-08-03 MED ORDER — METHOCARBAMOL 500 MG PO TABS
500.0000 mg | ORAL_TABLET | Freq: Four times a day (QID) | ORAL | Status: DC | PRN
  Administered 2015-08-05 – 2015-08-06 (×5): 500 mg via ORAL
  Filled 2015-08-03 (×5): qty 1

## 2015-08-03 MED ORDER — DEXAMETHASONE SODIUM PHOSPHATE 10 MG/ML IJ SOLN
INTRAMUSCULAR | Status: AC
  Filled 2015-08-03: qty 1

## 2015-08-03 MED ORDER — MAGNESIUM CITRATE PO SOLN: 1.0000 | Freq: Once | ORAL | Status: DC | PRN

## 2015-08-03 MED ORDER — DOCUSATE SODIUM 100 MG PO CAPS
100.0000 mg | ORAL_CAPSULE | Freq: Two times a day (BID) | ORAL | Status: DC
  Administered 2015-08-03 – 2015-08-05 (×3): 100 mg via ORAL
  Filled 2015-08-03 (×5): qty 1

## 2015-08-03 MED ORDER — MORPHINE SULFATE (PF) 2 MG/ML IV SOLN
2.0000 mg | INTRAVENOUS | Status: DC | PRN
  Administered 2015-08-03 (×2): 2 mg via INTRAVENOUS
  Filled 2015-08-03: qty 1

## 2015-08-03 MED ORDER — MORPHINE SULFATE (PF) 4 MG/ML IV SOLN
4.0000 mg | Freq: Four times a day (QID) | INTRAVENOUS | Status: DC | PRN
  Administered 2015-08-03: 4 mg via INTRAVENOUS
  Filled 2015-08-03: qty 1

## 2015-08-03 MED ORDER — ONDANSETRON HCL 4 MG/2ML IJ SOLN
INTRAMUSCULAR | Status: AC
  Filled 2015-08-03: qty 2

## 2015-08-03 MED ORDER — SODIUM CHLORIDE 0.9 % IV SOLN
Freq: Once | INTRAVENOUS | Status: AC
  Administered 2015-08-03: 08:00:00 via INTRAVENOUS

## 2015-08-03 MED ORDER — POLYETHYLENE GLYCOL 3350 17 G PO PACK: 17.0000 g | PACK | Freq: Every day | ORAL | Status: DC | PRN

## 2015-08-03 MED ORDER — SODIUM CHLORIDE 0.9 % IR SOLN
Status: DC | PRN
  Administered 2015-08-03: 3000 mL

## 2015-08-03 MED ORDER — ACETAMINOPHEN 325 MG PO TABS: 325.0000 mg | ORAL_TABLET | ORAL | Status: DC | PRN

## 2015-08-03 MED ORDER — SODIUM CHLORIDE 0.9 % IJ SOLN
INTRAMUSCULAR | Status: AC
  Filled 2015-08-03: qty 50

## 2015-08-03 MED ORDER — MORPHINE SULFATE (PF) 2 MG/ML IV SOLN
2.0000 mg | Freq: Once | INTRAVENOUS | Status: AC
  Administered 2015-08-03: 2 mg via INTRAVENOUS
  Filled 2015-08-03: qty 1

## 2015-08-03 MED ORDER — MORPHINE SULFATE (PF) 4 MG/ML IV SOLN
4.0000 mg | Freq: Once | INTRAVENOUS | Status: AC
  Administered 2015-08-03: 4 mg via INTRAVENOUS
  Filled 2015-08-03: qty 1

## 2015-08-03 MED ORDER — SENNA 8.6 MG PO TABS
1.0000 | ORAL_TABLET | Freq: Two times a day (BID) | ORAL | Status: DC
  Administered 2015-08-03 – 2015-08-05 (×3): 8.6 mg via ORAL
  Filled 2015-08-03 (×5): qty 1

## 2015-08-03 MED ORDER — ACETAMINOPHEN 650 MG RE SUPP: 650.0000 mg | Freq: Four times a day (QID) | RECTAL | Status: DC | PRN

## 2015-08-03 MED ORDER — ONDANSETRON HCL 4 MG PO TABS: 4.0000 mg | ORAL_TABLET | Freq: Four times a day (QID) | ORAL | Status: DC | PRN

## 2015-08-03 MED ORDER — BUPIVACAINE HCL (PF) 0.5 % IJ SOLN
INTRAMUSCULAR | Status: DC | PRN
  Administered 2015-08-03: 2.6 mL via INTRATHECAL

## 2015-08-03 MED ORDER — ISOPROPYL ALCOHOL 70 % SOLN
Status: AC
  Filled 2015-08-03: qty 480

## 2015-08-03 MED ORDER — CEFAZOLIN SODIUM-DEXTROSE 2-4 GM/100ML-% IV SOLN
2.0000 g | INTRAVENOUS | Status: DC
  Filled 2015-08-03: qty 100

## 2015-08-03 MED ORDER — PROPOFOL 500 MG/50ML IV EMUL
INTRAVENOUS | Status: DC | PRN
  Administered 2015-08-03: 75 ug/kg/min via INTRAVENOUS

## 2015-08-03 MED ORDER — METOCLOPRAMIDE HCL 5 MG PO TABS: 5.0000 mg | ORAL_TABLET | Freq: Three times a day (TID) | ORAL | Status: DC | PRN

## 2015-08-03 MED ORDER — METOCLOPRAMIDE HCL 5 MG/ML IJ SOLN: 5.0000 mg | Freq: Three times a day (TID) | INTRAMUSCULAR | Status: DC | PRN

## 2015-08-03 MED ORDER — BUPIVACAINE-EPINEPHRINE 0.25% -1:200000 IJ SOLN
INTRAMUSCULAR | Status: DC | PRN
  Administered 2015-08-03: 30 mL

## 2015-08-03 MED ORDER — ALUM & MAG HYDROXIDE-SIMETH 200-200-20 MG/5ML PO SUSP: 30.0000 mL | ORAL | Status: DC | PRN

## 2015-08-03 MED ORDER — ASPIRIN EC 81 MG PO TBEC
81.0000 mg | DELAYED_RELEASE_TABLET | Freq: Two times a day (BID) | ORAL | Status: DC
  Administered 2015-08-04 – 2015-08-06 (×5): 81 mg via ORAL
  Filled 2015-08-03 (×5): qty 1

## 2015-08-03 MED ORDER — FENTANYL CITRATE (PF) 100 MCG/2ML IJ SOLN
INTRAMUSCULAR | Status: DC | PRN
  Administered 2015-08-03 (×2): 50 ug via INTRAVENOUS

## 2015-08-03 MED ORDER — METOPROLOL TARTRATE 25 MG PO TABS
25.0000 mg | ORAL_TABLET | Freq: Three times a day (TID) | ORAL | Status: DC
  Administered 2015-08-03 – 2015-08-04 (×3): 25 mg via ORAL
  Filled 2015-08-03 (×3): qty 1

## 2015-08-03 MED ORDER — MIDAZOLAM HCL 2 MG/2ML IJ SOLN
INTRAMUSCULAR | Status: AC
  Filled 2015-08-03: qty 2

## 2015-08-03 SURGICAL SUPPLY — 42 items
BAG DECANTER FOR FLEXI CONT (MISCELLANEOUS) IMPLANT
BAG SPEC THK2 15X12 ZIP CLS (MISCELLANEOUS)
BAG ZIPLOCK 12X15 (MISCELLANEOUS) IMPLANT
CAPT HIP TOTAL 2 ×2 IMPLANT
CHLORAPREP W/TINT 26ML (MISCELLANEOUS) ×3 IMPLANT
CLOTH BEACON ORANGE TIMEOUT ST (SAFETY) ×3 IMPLANT
COVER PERINEAL POST (MISCELLANEOUS) ×3 IMPLANT
DECANTER SPIKE VIAL GLASS SM (MISCELLANEOUS) ×3 IMPLANT
DRAPE LG THREE QUARTER DISP (DRAPES) ×6 IMPLANT
DRAPE STERI IOBAN 125X83 (DRAPES) ×3 IMPLANT
DRAPE U-SHAPE 47X51 STRL (DRAPES) ×6 IMPLANT
DRSG AQUACEL AG ADV 3.5X10 (GAUZE/BANDAGES/DRESSINGS) ×3 IMPLANT
ELECT REM PT RETURN 15FT ADLT (MISCELLANEOUS) ×3 IMPLANT
GAUZE SPONGE 4X4 12PLY STRL (GAUZE/BANDAGES/DRESSINGS) ×3 IMPLANT
GLOVE BIO SURGEON STRL SZ8.5 (GLOVE) ×6 IMPLANT
GLOVE BIOGEL PI IND STRL 8.5 (GLOVE) ×1 IMPLANT
GLOVE BIOGEL PI INDICATOR 8.5 (GLOVE) ×2
GOWN SPEC L3 XXLG W/TWL (GOWN DISPOSABLE) ×3 IMPLANT
HANDPIECE INTERPULSE COAX TIP (DISPOSABLE) ×3
HOLDER FOLEY CATH W/STRAP (MISCELLANEOUS) ×3 IMPLANT
HOOD PEEL AWAY FLYTE STAYCOOL (MISCELLANEOUS) ×6 IMPLANT
LIQUID BAND (GAUZE/BANDAGES/DRESSINGS) ×4 IMPLANT
MARKER SKIN DUAL TIP RULER LAB (MISCELLANEOUS) ×3 IMPLANT
NDL SPNL 18GX3.5 QUINCKE PK (NEEDLE) ×1 IMPLANT
NEEDLE SPNL 18GX3.5 QUINCKE PK (NEEDLE) ×3 IMPLANT
PACK ANTERIOR HIP CUSTOM (KITS) ×3 IMPLANT
SAW OSC TIP CART 19.5X105X1.3 (SAW) ×3 IMPLANT
SEALER BIPOLAR AQUA 6.0 (INSTRUMENTS) ×3 IMPLANT
SET HNDPC FAN SPRY TIP SCT (DISPOSABLE) ×1 IMPLANT
SOL PREP POV-IOD 4OZ 10% (MISCELLANEOUS) ×3 IMPLANT
SUT ETHIBOND NAB CT1 #1 30IN (SUTURE) ×6 IMPLANT
SUT MNCRL AB 3-0 PS2 18 (SUTURE) ×3 IMPLANT
SUT MON AB 2-0 CT1 36 (SUTURE) ×6 IMPLANT
SUT VIC AB 1 CT1 36 (SUTURE) ×3 IMPLANT
SUT VIC AB 2-0 CT1 27 (SUTURE) ×3
SUT VIC AB 2-0 CT1 TAPERPNT 27 (SUTURE) ×1 IMPLANT
SUT VLOC 180 0 24IN GS25 (SUTURE) ×3 IMPLANT
SYR 50ML LL SCALE MARK (SYRINGE) ×3 IMPLANT
TRAY FOLEY W/METER SILVER 14FR (SET/KITS/TRAYS/PACK) IMPLANT
TRAY FOLEY W/METER SILVER 16FR (SET/KITS/TRAYS/PACK) IMPLANT
WATER STERILE IRR 1500ML POUR (IV SOLUTION) ×3 IMPLANT
YANKAUER SUCT BULB TIP 10FT TU (MISCELLANEOUS) ×3 IMPLANT

## 2015-08-03 NOTE — ED Provider Notes (Signed)
CSN: [id number]     Arrival date & time 08/03/15  0409 History   First MD Initiated Contact with Patient 08/03/15 [phone]     Chief Complaint  Patient presents with  . Fall  . Hip Pain     (Consider location/radiation/quality/duration/timing/severity/associated sxs/prior Treatment) HPI Comments: 70 year old female with past medical history including hypertension, osteoporosis who presents with left hip pain. Just prior to arrival, the patient was walking in her kitchen and slipped on water or dog urine and fell onto her left hip. She did not strike her head or lose consciousness. She endorses moderate to severe L hip pain that is minimal at rest, worse with rotating her hip. No knee or foot pain. She endorses normal sensation in extremities. She denies any chest or abdominal pain, or any other injuries. No anticoagulant use.  Of note, patient had bilateral knee steroid injections earlier today for arthritis pain.  Patient is a 70 y.o. female presenting with fall and hip pain. The history is provided by the patient.  Fall  Hip Pain    Past Medical History  Diagnosis Date  . Arthritis   . Asthma   . Blood transfusion without reported diagnosis   . Hypertension   . Osteoporosis    Past Surgical History  Procedure Laterality Date  . Colon surgery    . Tubal ligation     Family History  Problem Relation Age of Onset  . Cataracts Sister   . Hypertension Sister   . Hypertension Brother    Social History  Substance Use Topics  . Smoking status: Never Smoker   . Smokeless tobacco: Never Used  . Alcohol Use: No   OB History    No data available     Review of Systems 10 Systems reviewed and are negative for acute change except as noted in the HPI.    Allergies  Pollen extract  Home Medications   Prior to Admission medications   Medication Sig Start Date End Date Taking? Authorizing Provider  acetaminophen (TYLENOL) 500 MG tablet Take 500 mg by mouth 2 (two) times daily  as needed for mild pain or headache.    Yes Historical Provider, MD  alendronate (FOSAMAX) 70 MG tablet Take 70 mg by mouth every 7 (seven) days. 04/30/15  Yes Historical Provider, MD  amLODipine (NORVASC) 10 MG tablet Take 10 mg by mouth daily.   Yes Historical Provider, MD  aspirin-acetaminophen-caffeine (EXCEDRIN MIGRAINE) [phone] MG tablet Take 1 tablet by mouth every 6 (six) hours as needed for headache or migraine.   Yes Historical Provider, MD  baclofen (LIORESAL) 10 MG tablet Take 10 mg by mouth as needed for muscle spasms.    Yes Historical Provider, MD  Cholecalciferol 1000 UNITS capsule Take 1,000 Units by mouth daily.   Yes Historical Provider, MD  ferrous sulfate 325 (65 FE) MG EC tablet Take 325 mg by mouth daily with breakfast.   Yes Historical Provider, MD  lisinopril (PRINIVIL,ZESTRIL) 2.5 MG tablet Take 2.5 mg by mouth daily. 06/15/15  Yes Historical Provider, MD  magnesium oxide (MAG-OX) 400 MG tablet Take 400 mg by mouth 2 (two) times daily.   Yes Historical Provider, MD  meloxicam (MOBIC) 15 MG tablet Take 15 mg by mouth daily as needed for pain.    Yes Historical Provider, MD  Multiple Vitamin (MULTIVITAMIN WITH MINERALS) TABS tablet Take 1 tablet by mouth daily.   Yes Historical Provider, MD  Omega-3 Fatty Acids (OMEGA-3 FISH OIL PO) Take 15 mLs by  mouth daily.   Yes Historical Provider, MD  omeprazole (PRILOSEC) 20 MG capsule Take 20 mg by mouth daily as needed (acid reflux).    Yes Historical Provider, MD   BP 129/79 mmHg  Pulse 113  Temp(Src) 97.7 F (36.5 C) (Oral)  Resp 16  SpO2 94% Physical Exam  Constitutional: She is oriented to person, place, and time. She appears well-developed and well-nourished. No distress.  HENT:  Head: Normocephalic and atraumatic.  Moist mucous membranes  Eyes: Conjunctivae and EOM are normal. Pupils are equal, round, and reactive to light.  Neck: Neck supple.  Cardiovascular: Normal rate, regular rhythm and intact distal pulses.    Murmur heard.  Systolic murmur is present with a grade of 2/6  Pulmonary/Chest: Effort normal and breath sounds normal. She exhibits no tenderness.  Abdominal: Soft. Bowel sounds are normal. She exhibits no distension. There is no tenderness.  Musculoskeletal: She exhibits no edema.  L leg shortened and externally rotated, normal sensation BLE; no TTP knees  Neurological: She is alert and oriented to person, place, and time. No cranial nerve deficit.  Fluent speech  Skin: Skin is warm and dry.  Psychiatric: She has a normal mood and affect. Judgment normal.  Nursing note and vitals reviewed.   ED Course  Procedures (including critical care time) Labs Review Labs Reviewed  BASIC METABOLIC PANEL - Abnormal; Notable for the following:    Sodium 134 (*)    CO2 17 (*)    Glucose, Bld 115 (*)    BUN 21 (*)    Creatinine, Ser 1.07 (*)    Calcium 8.7 (*)    GFR calc non Af Amer 52 (*)    GFR calc Af Amer 60 (*)    All other components within normal limits  CBC WITH DIFFERENTIAL/PLATELET - Abnormal; Notable for the following:    WBC 11.8 (*)    RDW 15.6 (*)    Neutro Abs 9.8 (*)    All other components within normal limits  PROTIME-INR    Imaging Review Dg Chest 2 View  08/03/2015  CLINICAL DATA:  Fall onto left hip.  Hip fracture.  Preop. EXAM: CHEST  2 VIEW COMPARISON:  02/04/2014 FINDINGS: Chronic widening of the upper mediastinum which is likely from ectatic vessels. The trachea is not deviated or narrowed. Heart size within normal limits. Stable aortic tortuosity. Chronic borderline hyperinflation. There is no edema, consolidation, effusion, or pneumothorax. IMPRESSION: No evidence of active disease. Electronically Signed   By: Marnee SpringJonathon  Watts M.D.   On: 08/03/2015 05:24   Dg Hip Unilat With Pelvis 2-3 Views Left  08/03/2015  CLINICAL DATA:  Fall at home with left hip pain.  Initial encounter. EXAM: DG HIP (WITH OR WITHOUT PELVIS) 2-3V LEFT COMPARISON:  None. FINDINGS: Displaced  left femoral neck fracture. No notable degenerative changes to the hip. No evidence of pelvic ring fracture or diastasis. Lower lumbar disc degeneration with narrowing and spurring. Postoperative bowel in the right lower quadrant. IMPRESSION: Displaced left femoral neck fracture. Electronically Signed   By: Marnee SpringJonathon  Watts M.D.   On: 08/03/2015 05:22   I have personally reviewed and evaluated these images and lab results as part of my medical decision-making.   EKG Interpretation None     Medications  morphine 4 MG/ML injection 4 mg (4 mg Intravenous Given 08/03/15 0739)  0.9 %  sodium chloride infusion ( Intravenous New Bag/Given 08/03/15 0740)    MDM   Final diagnoses:  Left displaced femoral neck fracture,  closed, initial encounter (HCC)   Pt p/w L hip pain after fall onto hip. She was awake and well-appearing with reassuring vital signs at presentation, no other complaints aside from hip pain. Left leg shortened and externally rotated. She was neurovascularly intact. Obtained plain films Which show a left displaced femoral neck fracture. Chest x-ray negative acute. Basic screening lab work unremarkable. Gave morphine for pain. Discussed with orthopedics, Dr. Linna Caprice, who has requested NPO status as pt has not eaten since 8pm last night and may be able to be repaired today. Discussed admission with hospitalist, Dr. Dartha Lodge, and pt admitted for further care.   Laurence Spates, MD 08/03/15 [phone]

## 2015-08-03 NOTE — Transfer of Care (Signed)
Immediate Anesthesia Transfer of Care Note  Patient: Jenna LuoMary L Tsosie  Procedure(s) Performed: Procedure(s): TOTAL LEFT HIP ARTHROPLASTY ANTERIOR APPROACH (Left)  Patient Location: PACU  Anesthesia Type:Spinal  Level of Consciousness: sedated, patient cooperative and responds to stimulation  Airway & Oxygen Therapy: Patient Spontanous Breathing and Patient connected to face mask oxygen  Post-op Assessment: Report given to RN and Post -op Vital signs reviewed and stable  Post vital signs: Reviewed and stable  Last Vitals:  Filed Vitals:   08/03/15 1000 08/03/15 1342  BP: 118/71 139/77  Pulse: 111 111  Temp: 36.9 C 36.6 C  Resp: 18 20    Last Pain:  Filed Vitals:   08/03/15 1646  PainSc: 4          Complications: No apparent anesthesia complications

## 2015-08-03 NOTE — Consult Note (Signed)
ORTHOPAEDIC CONSULTATION  REQUESTING PHYSICIAN: Barnetta Chapel, MD  PCP:  Roxanne Mins, PA-C  Chief Complaint: Displaced left femoral neck fracture  HPI: Monica Conway is a 70 y.o. female who slipped and fell in her kitchen on the wet floor earlier this morning. She landed on her left hip. She reports left hip pain and inability to weight-bear. She was brought to the emergency department and x-rays revealed a displaced left femoral neck fracture. Orthopedic consultation was placed for management of left hip fracture. The patient endorses a history of end-stage bilateral knee arthritis for which she receives Flexogenic injections. She was admitted by the hospitalist and underwent perioperative risk stratification and medical optimization. She lives alone and tells me that she has already arranged for family members to come stay with her after she is discharged home from the hospital.  Past Medical History  Diagnosis Date  . Arthritis   . Asthma   . Blood transfusion without reported diagnosis   . Hypertension   . Osteoporosis    Past Surgical History  Procedure Laterality Date  . Colon surgery    . Tubal ligation     Social History   Social History  . Marital Status: Divorced    Spouse Name: N/A  . Number of Children: N/A  . Years of Education: N/A   Social History Main Topics  . Smoking status: Never Smoker   . Smokeless tobacco: Never Used  . Alcohol Use: No  . Drug Use: No  . Sexual Activity: Not Currently   Other Topics Concern  . None   Social History Narrative   Family History  Problem Relation Age of Onset  . Cataracts Sister   . Hypertension Sister   . Hypertension Brother    Allergies  Allergen Reactions  . Pollen Extract Shortness Of Breath   Prior to Admission medications   Medication Sig Start Date End Date Taking? Authorizing Provider  acetaminophen (TYLENOL) 500 MG tablet Take 500 mg by mouth 2 (two) times daily as needed for mild pain or  headache.    Yes Historical Provider, MD  alendronate (FOSAMAX) 70 MG tablet Take 70 mg by mouth every 7 (seven) days. 04/30/15  Yes Historical Provider, MD  amLODipine (NORVASC) 10 MG tablet Take 10 mg by mouth daily.   Yes Historical Provider, MD  aspirin-acetaminophen-caffeine (EXCEDRIN MIGRAINE) [phone] MG tablet Take 1 tablet by mouth every 6 (six) hours as needed for headache or migraine.   Yes Historical Provider, MD  baclofen (LIORESAL) 10 MG tablet Take 10 mg by mouth as needed for muscle spasms.    Yes Historical Provider, MD  Cholecalciferol 1000 UNITS capsule Take 1,000 Units by mouth daily.   Yes Historical Provider, MD  ferrous sulfate 325 (65 FE) MG EC tablet Take 325 mg by mouth daily with breakfast.   Yes Historical Provider, MD  lisinopril (PRINIVIL,ZESTRIL) 2.5 MG tablet Take 2.5 mg by mouth daily. 06/15/15  Yes Historical Provider, MD  magnesium oxide (MAG-OX) 400 MG tablet Take 400 mg by mouth 2 (two) times daily.   Yes Historical Provider, MD  meloxicam (MOBIC) 15 MG tablet Take 15 mg by mouth daily as needed for pain.    Yes Historical Provider, MD  Multiple Vitamin (MULTIVITAMIN WITH MINERALS) TABS tablet Take 1 tablet by mouth daily.   Yes Historical Provider, MD  Omega-3 Fatty Acids (OMEGA-3 FISH OIL PO) Take 15 mLs by mouth daily.   Yes Historical Provider, MD  omeprazole (PRILOSEC) 20 MG  capsule Take 20 mg by mouth daily as needed (acid reflux).    Yes Historical Provider, MD   Dg Chest 2 View  08/03/2015  CLINICAL DATA:  Fall onto left hip.  Hip fracture.  Preop. EXAM: CHEST  2 VIEW COMPARISON:  02/04/2014 FINDINGS: Chronic widening of the upper mediastinum which is likely from ectatic vessels. The trachea is not deviated or narrowed. Heart size within normal limits. Stable aortic tortuosity. Chronic borderline hyperinflation. There is no edema, consolidation, effusion, or pneumothorax. IMPRESSION: No evidence of active disease. Electronically Signed   By: Marnee SpringJonathon  Watts  M.D.   On: 08/03/2015 05:24   Dg Knee Left Port  08/03/2015  CLINICAL DATA:  Status post fall with left hip fracture and pain within the left leg. EXAM: PORTABLE LEFT KNEE - 1-2 VIEW COMPARISON:  None. FINDINGS: There is no evidence of displaced fracture. There is flattening of the medial tibial plateau with associated curvilinear subchondral lucency, with uncertain significance. 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 There is a small suprapatellar joint effusion. IMPRESSION: Flattening of the medial tibial plateau with associated subchondral curvilinear lucency. This may represent osteoarthritic changes, however non displaced medial tibial plateau fracture cannot be entirely excluded. Please correlate to patient's clinical exam. Small suprapatellar joint effusion. Electronically Signed   By: Ted Mcalpineobrinka  Dimitrova M.D.   On: 08/03/2015 08:21   Dg Hip Unilat With Pelvis 2-3 Views Left  08/03/2015  CLINICAL DATA:  Fall at home with left hip pain.  Initial encounter. EXAM: DG HIP (WITH OR WITHOUT PELVIS) 2-3V LEFT COMPARISON:  None. FINDINGS: Displaced left femoral neck fracture. No notable degenerative changes to the hip. No evidence of pelvic ring fracture or diastasis. Lower lumbar disc degeneration with narrowing and spurring. Postoperative bowel in the right lower quadrant. IMPRESSION: Displaced left femoral neck fracture. Electronically Signed   By: Marnee SpringJonathon  Watts M.D.   On: 08/03/2015 05:22    Positive ROS: All other systems have been reviewed and were otherwise negative with the exception of those mentioned in the HPI and as above.  Physical Exam: General: Alert, no acute distress Cardiovascular: No pedal edema Respiratory: No cyanosis, no use of accessory musculature GI: No organomegaly, abdomen is soft and non-tender Skin: No lesions in the area of chief complaint Neurologic: Sensation intact distally Psychiatric: Patient is competent for consent with normal mood and  affect Lymphatic: No axillary or cervical lymphadenopathy  MUSCULOSKELETAL: Examination of the left lower extremity reveals that it is shortened and externally rotated. She has pain with range of motion of the hip. There are no skin wounds or lesions. She has palpable pedal pulses there is no focal motor or sensory deficit.  Assessment: Displaced left femoral neck fracture  Plan: I discussed the findings with the patient. I recommended left total hip arthroplasty to treat her femoral neck fracture. We discussed the risks, benefits, and alternatives. Continue nothing by mouth status. Hold chemical DVT prophylaxis.  The risks, benefits, and alternatives were discussed with the patient. There are risks associated with the surgery including, but not limited to, problems with anesthesia (death), infection, instability (giving out of the joint), dislocation, differences in leg length/angulation/rotation, fracture of bones, loosening or failure of implants, hematoma (blood accumulation) which may require surgical drainage, blood clots, pulmonary embolism, nerve injury (foot drop and lateral thigh numbness), and blood vessel injury. The patient understands these risks and elects to proceed.     Vernis Cabacungan, Cloyde ReamsBrian James, MD Cell [phone]    08/03/2015 3:44 PM

## 2015-08-03 NOTE — ED Notes (Signed)
Bed: RESA Expected date:  Expected time:  Means of arrival:  Comments: Fall, left leg pain

## 2015-08-03 NOTE — Discharge Instructions (Signed)
°Dr. Deissy Guilbert °Joint Replacement Specialist °[facility] °[address] °[place], [place] [address] °[phone] ° ° °TOTAL HIP REPLACEMENT POSTOPERATIVE DIRECTIONS ° ° ° °Hip Rehabilitation, Guidelines Following Surgery  ° °WEIGHT BEARING °Weight bearing as tolerated with assist device (walker, cane, etc) as directed, use it as long as suggested by your surgeon or therapist, typically at least 4-6 weeks. ° °The results of a hip operation are greatly improved after range of motion and muscle strengthening exercises. Follow all safety measures which are given to protect your hip. If any of these exercises cause increased pain or swelling in your joint, decrease the amount until you are comfortable again. Then slowly increase the exercises. Call your caregiver if you have problems or questions.  ° °HOME CARE INSTRUCTIONS  °Most of the following instructions are designed to prevent the dislocation of your new hip.  °Remove items at home which could result in a fall. This includes throw rugs or furniture in walking pathways.  °Continue medications as instructed at time of discharge. °· You may have some home medications which will be placed on hold until you complete the course of blood thinner medication. °· You may start showering once you are discharged home. Do not remove your dressing. °Do not put on socks or shoes without following the instructions of your caregivers.   °Sit on chairs with arms. Use the chair arms to help push yourself up when arising.  °Arrange for the use of a toilet seat elevator so you are not sitting low.  °· Walk with walker as instructed.  °You may resume a sexual relationship in one month or when given the OK by your caregiver.  °Use walker as long as suggested by your caregivers.  °You may put full weight on your legs and walk as much as is comfortable. °Avoid periods of inactivity such as sitting longer than an hour when not asleep. This helps prevent  blood clots.  °You may return to work once you are cleared by your surgeon.  °Do not drive a car for 6 weeks or until released by your surgeon.  °Do not drive while taking narcotics.  °Wear elastic stockings for two weeks following surgery during the day but you may remove then at night.  °Make sure you keep all of your appointments after your operation with all of your doctors and caregivers. You should call the office at the above phone number and make an appointment for approximately two weeks after the date of your surgery. °Please pick up a stool softener and laxative for home use as long as you are requiring pain medications. °· ICE to the affected hip every three hours for 30 minutes at a time and then as needed for pain and swelling. Continue to use ice on the hip for pain and swelling from surgery. You may notice swelling that will progress down to the foot and ankle.  This is normal after surgery.  Elevate the leg when you are not up walking on it.   °It is important for you to complete the blood thinner medication as prescribed by your doctor. °· Continue to use the breathing machine which will help keep your temperature down.  It is common for your temperature to cycle up and down following surgery, especially at night when you are not up moving around and exerting yourself.  The breathing machine keeps your lungs expanded and your temperature down. ° °RANGE OF MOTION AND STRENGTHENING EXERCISES  °These exercises are   designed to help you keep full movement of your hip joint. Follow your caregiver's or physical therapist's instructions. Perform all exercises about fifteen times, three times per day or as directed. Exercise both hips, even if you have had only one joint replacement. These exercises can be done on a training (exercise) mat, on the floor, on a table or on a bed. Use whatever works the best and is most comfortable for you. Use music or television while you are exercising so that the exercises  are a pleasant break in your day. This will make your life better with the exercises acting as a break in routine you can look forward to.  °Lying on your back, slowly slide your foot toward your buttocks, raising your knee up off the floor. Then slowly slide your foot back down until your leg is straight again.  °Lying on your back spread your legs as far apart as you can without causing discomfort.  °Lying on your side, raise your upper leg and foot straight up from the floor as far as is comfortable. Slowly lower the leg and repeat.  °Lying on your back, tighten up the muscle in the front of your thigh (quadriceps muscles). You can do this by keeping your leg straight and trying to raise your heel off the floor. This helps strengthen the largest muscle supporting your knee.  °Lying on your back, tighten up the muscles of your buttocks both with the legs straight and with the knee bent at a comfortable angle while keeping your heel on the floor.  ° °SKILLED REHAB INSTRUCTIONS: °If the patient is transferred to a skilled rehab facility following release from the hospital, a list of the current medications will be sent to the facility for the patient to continue.  When discharged from the skilled rehab facility, please have the facility set up the patient's Home Health Physical Therapy prior to being released. Also, the skilled facility will be responsible for providing the patient with their medications at time of release from the facility to include their pain medication and their blood thinner medication. If the patient is still at the rehab facility at time of the two week follow up appointment, the skilled rehab facility will also need to assist the patient in arranging follow up appointment in our office and any transportation needs. ° °MAKE SURE YOU:  °Understand these instructions.  °Will watch your condition.  °Will get help right away if you are not doing well or get worse. ° °Pick up stool softner and  laxative for home use following surgery while on pain medications. °Do not remove your dressing. °The dressing is waterproof--it is OK to take showers. °Continue to use ice for pain and swelling after surgery. °Do not use any lotions or creams on the incision until instructed by your surgeon. °Total Hip Protocol. ° ° °

## 2015-08-03 NOTE — ED Notes (Signed)
Bobby (Brother) [phone]

## 2015-08-03 NOTE — ED Notes (Signed)
MD at bedside. 

## 2015-08-03 NOTE — ED Notes (Signed)
Pt BIB EMS from home; pt slipped on dog pee in the kitchen and landed on left hip; pt c/o pain to left hip; denies hitting head and denies LOC; shortening noted to left leg; pt states that her left leg has always been slightly shorter than the right leg; A&Ox4.

## 2015-08-03 NOTE — Op Note (Signed)
OPERATIVE REPORT  SURGEON: Samson Frederic, MD   ASSISTANT: Irish Elders, PA-C.  PREOPERATIVE DIAGNOSIS: Displaced Left femoral neck fracture.    POSTOPERATIVE DIAGNOSIS: Displaced Left femoral neck fracture.   PROCEDURE: Left total hip arthroplasty, anterior approach.   IMPLANTS: DePuy Tri Lock stem, size 3, hi offset. DePuy P[employer], size 48 mm. DePuy Altrx liner, size 48 by 28 mm, +4 neutral. DePuy Biolox ceramic head ball, size 28 + 1.5 mm. Pinnacle cancellous bone screw 6.5 mm 2.  ANESTHESIA:  Spinal  ESTIMATED BLOOD LOSS: 400 mL.  ANTIBIOTICS: 2 g Ancef.  DRAINS: None.  COMPLICATIONS: None.   CONDITION: PACU - hemodynamically stable.Marland Kitchen   BRIEF CLINICAL NOTE: Monica Conway is a 70 y.o. female who presented to the emergency department with a displaced left femoral neck fracture after slipping and falling on the wet kitchen floor. She was admitted to the hospitalist service and underwent perioperative risk stratification and medical optimization. She was indicated for total hip arthroplasty. The risks, benefits, and alternatives to the procedure were explained, and the patient elected to proceed.  PROCEDURE IN DETAIL: Surgical site was marked by myself. Spinal anesthesia was obtained in the pre-op holding area. Once inside the operative room, a foley catheter was inserted. The patient was then positioned on the Hana table. All bony prominences were well padded. The hip was prepped and draped in the normal sterile surgical fashion. A time-out was called verifying side and site of surgery. The patient received IV antibiotics within 60 minutes of beginning the procedure.  The direct anterior approach to the hip was performed through the Hueter interval. Lateral femoral circumflex vessels were treated with the Auqumantys. The anterior capsule was exposed and an inverted T capsulotomy was made.I evacuated the fracture hematoma. I identified the  subcapital femoral neck fracture which was comminuted and displaced. The femoral neck cut was made to the level of the templated cut. A corkscrew was placed into the head and the head was removed. The head was passed to the back table and was measured.  Acetabular exposure was achieved, and the pulvinar and labrum were excised. Sequental reaming of the acetabulum was then performed up to a size 47 mm reamer. A 48 mm cup was then opened and impacted into place at approximately 45 degrees of abduction and 20 degrees of anteversion. I chose to augment the already acceptable bony stability with 6.5 mm cancellous screws 2. The final polyethylene liner was impacted into place and acetabular osteophytes were removed.   I then gained femoral exposure taking care to protect the abductors and greater trochanter. This was performed using standard external rotation, extension, and adduction. The capsule was peeled off the inner aspect of the greater trochanter, taking care to preserve the short external rotators. A cookie cutter was used to enter the femoral canal, and then the femoral canal finder was placed. Sequential broaching was performed up to a size 3. Calcar planer was used on the femoral neck remnant. I placed a std offset neck and a trial head ball. The hip was reduced. Leg lengths and offset were checked fluoroscopically. Leg length was re-created, however she needed a high offset neck to tension her abductors appropriately. The hip was dislocated and trial components were removed. The final implants were placed, and the hip was reduced.  Fluoroscopy was used to confirm component position and leg lengths. Her operative lower extremity was lengthened a few millimeters, and this was deemed acceptable for the sake of stability. At  90 degrees of external rotation and full extension, the hip was stable to an anterior directed force.  The wound was copiously irrigated with a dilute betadine solution  followed by normal saline. Marcaine solution was injected into the periarticular soft tissue. The wound was closed in layers using #1 Vicryl and V-Loc for the fascia, 2-0 Vicryl for the subcutaneous fat, 2-0 Monocryl for the deep dermal layer, 3-0 running Monocryl subcuticular stitch, and Dermabond for the skin. Once the glue was fully dried, an Aquacell Ag dressing was applied. The patient was transported to the recovery room in stable condition. Sponge, needle, and instrument counts were correct at the end of the case x2. The patient tolerated the procedure well and there were no known complications.  Please note that a surgical assistant was a medical necessity for this procedure to perform it in a safe and expeditious manner. Assistant was necessary to provide appropriate retraction of vital neurovascular structures, to prevent femoral fracture, and to allow for anatomic placement of the prosthesis.  The patient will be readmitted to the hospitalist service. She may weight-bear as tolerated with a walker. She will receive aspirin 81 mg by mouth twice a day with meals for 6 weeks for DVT prophylaxis. She will work with physical therapy and occupational therapy. The patient plans to be discharged home with home health physical therapy. I will see her in the office 2 weeks after discharge.

## 2015-08-03 NOTE — Anesthesia Procedure Notes (Signed)
Spinal Patient location during procedure: OR Staffing Anesthesiologist: Josilyn Shippee Preanesthetic Checklist Completed: patient identified, surgical consent, pre-op evaluation, timeout performed, IV checked, risks and benefits discussed and monitors and equipment checked Spinal Block Patient position: left lateral decubitus Prep: ChloraPrep and site prepped and draped Patient monitoring: heart rate, cardiac monitor, continuous pulse ox and blood pressure Approach: midline Location: L3-4 Injection technique: single-shot Needle Needle type: Sprotte  Needle gauge: 24 G Needle length: 10 cm Assessment Sensory level: T8

## 2015-08-03 NOTE — Anesthesia Preprocedure Evaluation (Signed)
Anesthesia Evaluation  Patient identified by MRN, date of birth, ID band Patient awake    Reviewed: Allergy & Precautions, NPO status , Patient's Chart, lab work & pertinent test results  History of Anesthesia Complications Negative for: history of anesthetic complications  Airway Mallampati: III  TM Distance: >3 FB Neck ROM: Full    Dental  (+) Upper Dentures, Teeth Intact   Pulmonary neg shortness of breath, neg sleep apnea, neg COPD, neg recent URI,    breath sounds clear to auscultation       Cardiovascular hypertension, Pt. on medications (-) angina(-) Past MI and (-) CHF (-) dysrhythmias  Rhythm:Regular     Neuro/Psych negative neurological ROS  negative psych ROS   GI/Hepatic Neg liver ROS, GERD  Medicated and Controlled,  Endo/Other  negative endocrine ROS  Renal/GU negative Renal ROS     Musculoskeletal  (+) Arthritis , Left hip fracture   Abdominal   Peds  Hematology negative hematology ROS (+)   Anesthesia Other Findings   Reproductive/Obstetrics                             Anesthesia Physical Anesthesia Plan  ASA: II  Anesthesia Plan: MAC and Spinal   Post-op Pain Management:    Induction: Intravenous  Airway Management Planned: Natural Airway, Nasal Cannula and Simple Face Mask  Additional Equipment: None  Intra-op Plan:   Post-operative Plan:   Informed Consent: I have reviewed the patients History and Physical, chart, labs and discussed the procedure including the risks, benefits and alternatives for the proposed anesthesia with the patient or authorized representative who has indicated his/her understanding and acceptance.   Dental advisory given  Plan Discussed with: CRNA and Surgeon  Anesthesia Plan Comments:         Anesthesia Quick Evaluation

## 2015-08-03 NOTE — H&P (Signed)
History and Physical  Monica Conway TFT:[id number] DOB: 1945/08/25 DOA: 08/03/2015  Referring physician: ER physician PCP: No PCP Per Patient  Outpatient Specialists:    Patient coming from: Home  Chief Complaint: Left hip pain  HPI: 70 year old Caucasian female with history of HTN, asthma and osteoporosis. Patient had a mechanical fall at home last night (slipped and fell) with subsequent closed left hip fracture (Displaced femoral neck fracture). Patient was fairly active prior to the fall without any limitations. No chest pain, SOB or Dyspnea on exertion. Patient tells me that she had negative cardiac work up at [facility] in October of last year. No headache, no neck pain, no chest pain, no SOB, no limitation of activities, no GI symptoms and no urinary symptoms. No fever or chills.  ED Course: Orthopedic consulted. Pain medication administered.  Pertinent labs: Scr is 1.07 (up from 0.54 last year) EKG: Independently reviewed.  Imaging: independently reviewed.   Review of Systems: As in HPI. 12 systems were reviewed. Negative for fever, visual changes, sore throat, rash, new muscle aches, chest pain, SOB, dysuria, bleeding, n/v/abdominal pain.  Past Medical History  Diagnosis Date  . Arthritis   . Asthma   . Blood transfusion without reported diagnosis   . Hypertension   . Osteoporosis     Past Surgical History  Procedure Laterality Date  . Colon surgery    . Tubal ligation       reports that she has never smoked. She has never used smokeless tobacco. She reports that she does not drink alcohol or use illicit drugs.  Allergies  Allergen Reactions  . Pollen Extract Shortness Of Breath    Family History  Problem Relation Age of Onset  . Cataracts Sister   . Hypertension Sister   . Hypertension Brother      Prior to Admission medications   Medication Sig Start Date End Date Taking? Authorizing Provider  acetaminophen (TYLENOL) 500 MG tablet Take 500 mg by  mouth 2 (two) times daily as needed for mild pain or headache.    Yes Historical Provider, MD  alendronate (FOSAMAX) 70 MG tablet Take 70 mg by mouth every 7 (seven) days. 04/30/15  Yes Historical Provider, MD  amLODipine (NORVASC) 10 MG tablet Take 10 mg by mouth daily.   Yes Historical Provider, MD  aspirin-acetaminophen-caffeine (EXCEDRIN MIGRAINE) [phone] MG tablet Take 1 tablet by mouth every 6 (six) hours as needed for headache or migraine.   Yes Historical Provider, MD  baclofen (LIORESAL) 10 MG tablet Take 10 mg by mouth as needed for muscle spasms.    Yes Historical Provider, MD  Cholecalciferol 1000 UNITS capsule Take 1,000 Units by mouth daily.   Yes Historical Provider, MD  ferrous sulfate 325 (65 FE) MG EC tablet Take 325 mg by mouth daily with breakfast.   Yes Historical Provider, MD  lisinopril (PRINIVIL,ZESTRIL) 2.5 MG tablet Take 2.5 mg by mouth daily. 06/15/15  Yes Historical Provider, MD  magnesium oxide (MAG-OX) 400 MG tablet Take 400 mg by mouth 2 (two) times daily.   Yes Historical Provider, MD  meloxicam (MOBIC) 15 MG tablet Take 15 mg by mouth daily as needed for pain.    Yes Historical Provider, MD  Multiple Vitamin (MULTIVITAMIN WITH MINERALS) TABS tablet Take 1 tablet by mouth daily.   Yes Historical Provider, MD  Omega-3 Fatty Acids (OMEGA-3 FISH OIL PO) Take 15 mLs by mouth daily.   Yes Historical Provider, MD  omeprazole (PRILOSEC) 20 MG capsule  Take 20 mg by mouth daily as needed (acid reflux).    Yes Historical Provider, MD    Physical Exam: Filed Vitals:   08/03/15 0830 08/03/15 0900 08/03/15 0938 08/03/15 1000  BP:  118/81 104/82 118/71  Pulse: 110  110 111  Temp:    98.5 F (36.9 C)  TempSrc:    Oral  Resp:   16 18  Height:    5' (1.524 m)  Weight:    61.236 kg (135 lb)  SpO2: 92%  93% 98%    Constitutional:  . Appears calm and comfortable. Not in painful distress. Eyes:  . PERRL and irises appear normal . Normal conjunctivae and lids ENMT:   . external ears, nose appear normal Neck:  . neck is supple. No JVD. Respiratory:  . CTA bilaterally, no w/r/r.  Cardiovascular:  . RRR, no m/r/g . No LE extremity edema   Abdomen:  . Abdomen appears normal; no tenderness or masses Neurologic:  Awake and alert. Moves all limbs.   Wt Readings from Last 3 Encounters:  08/03/15 61.236 kg (135 lb)  02/13/14 55.4 kg (122 lb 2.2 oz)  02/05/14 55.4 kg (122 lb 2.2 oz)    I have personally reviewed following labs and imaging studies  Labs on Admission:  CBC:  Recent Labs Lab 08/03/15 0625  WBC 11.8*  NEUTROABS 9.8*  HGB 12.5  HCT 38.0  MCV 88.4  PLT 314   Basic Metabolic Panel:  Recent Labs Lab 08/03/15 0625  NA 134*  K 3.7  CL 105  CO2 17*  GLUCOSE 115*  BUN 21*  CREATININE 1.07*  CALCIUM 8.7*   Liver Function Tests: No results for input(s): AST, ALT, ALKPHOS, BILITOT, PROT, ALBUMIN in the last 168 hours. No results for input(s): LIPASE, AMYLASE in the last 168 hours. No results for input(s): AMMONIA in the last 168 hours. Coagulation Profile:  Recent Labs Lab 08/03/15 0625  INR 0.90   Cardiac Enzymes: No results for input(s): CKTOTAL, CKMB, CKMBINDEX, TROPONINI in the last 168 hours. BNP (last 3 results) No results for input(s): PROBNP in the last 8760 hours. HbA1C: No results for input(s): HGBA1C in the last 72 hours. CBG: No results for input(s): GLUCAP in the last 168 hours. Lipid Profile: No results for input(s): CHOL, HDL, LDLCALC, TRIG, CHOLHDL, LDLDIRECT in the last 72 hours. Thyroid Function Tests: No results for input(s): TSH, T4TOTAL, FREET4, T3FREE, THYROIDAB in the last 72 hours. Anemia Panel: No results for input(s): VITAMINB12, FOLATE, FERRITIN, TIBC, IRON, RETICCTPCT in the last 72 hours. Urine analysis:    Component Value Date/Time   COLORURINE YELLOW 02/04/2014 2020   APPEARANCEUR CLOUDY* 02/04/2014 2020   LABSPEC 1.025 02/04/2014 2020   PHURINE 6.0 02/04/2014 2020   GLUCOSEU  NEGATIVE 02/04/2014 2020   HGBUR LARGE* 02/04/2014 2020   BILIRUBINUR SMALL* 02/04/2014 2020   KETONESUR 15* 02/04/2014 2020   PROTEINUR >300* 02/04/2014 2020   UROBILINOGEN 0.2 02/04/2014 2020   NITRITE NEGATIVE 02/04/2014 2020   LEUKOCYTESUR SMALL* 02/04/2014 2020   Sepsis Labs: @LABRCNTIP (procalcitonin:4,lacticidven:4) )No results found for this or any previous visit (from the past 240 hour(s)).    Radiological Exams on Admission: Dg Chest 2 View  08/03/2015  CLINICAL DATA:  Fall onto left hip.  Hip fracture.  Preop. EXAM: CHEST  2 VIEW COMPARISON:  02/04/2014 FINDINGS: Chronic widening of the upper mediastinum which is likely from ectatic vessels. The trachea is not deviated or narrowed. Heart size within normal limits. Stable aortic tortuosity. Chronic borderline  hyperinflation. There is no edema, consolidation, effusion, or pneumothorax. IMPRESSION: No evidence of active disease. Electronically Signed   By: Marnee Spring M.D.   On: 08/03/2015 05:24   Dg Knee Left Port  08/03/2015  CLINICAL DATA:  Status post fall with left hip fracture and pain within the left leg. EXAM: PORTABLE LEFT KNEE - 1-2 VIEW COMPARISON:  None. FINDINGS: There is no evidence of displaced fracture. There is flattening of the medial tibial plateau with associated curvilinear subchondral lucency, with uncertain significance. 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 There is a small suprapatellar joint effusion. IMPRESSION: Flattening of the medial tibial plateau with associated subchondral curvilinear lucency. This may represent osteoarthritic changes, however non displaced medial tibial plateau fracture cannot be entirely excluded. Please correlate to patient's clinical exam. Small suprapatellar joint effusion. Electronically Signed   By: Ted Mcalpine M.D.   On: 08/03/2015 08:21   Dg Hip Unilat With Pelvis 2-3 Views Left  08/03/2015  CLINICAL DATA:  Fall at home with left hip pain.  Initial  encounter. EXAM: DG HIP (WITH OR WITHOUT PELVIS) 2-3V LEFT COMPARISON:  None. FINDINGS: Displaced left femoral neck fracture. No notable degenerative changes to the hip. No evidence of pelvic ring fracture or diastasis. Lower lumbar disc degeneration with narrowing and spurring. Postoperative bowel in the right lower quadrant. IMPRESSION: Displaced left femoral neck fracture. Electronically Signed   By: Marnee Spring M.D.   On: 08/03/2015 05:22    EKG: Independently reviewed.   Active Problems:   Closed left hip fracture (HCC)   Left displaced femoral neck fracture (HCC)   Assessment/Plan 1. Left hip fractur 2. Hypertension 3. AKI, Mild, likely prerenal   Admit patient to Hospitalist Service as per Hospital protocol  Orthopedic already consulted by the ER physician  Obtain cardiac work up done on the patient at [facility] in October of last year.  Beta blocker for cardiac prophylaxis  Peri-operative anticoagulation as per orthopedic team  IVF Fluids  Urine sodium and electrolytes, urine protein. For cardiac work up if no improvement is noted  Adequate analgesia to minimize adrenergic surges  Optimize blood pressure  Manage expectantly   DVT prophylaxis: Lovenox. Will adjust depending on Orthopedic's team choice Code Status: Full Family Communication: Niece and Brother in Social worker Disposition Plan: Likely Rehab on discharge   Consults called: ER has already consulted orthopedic team   Admission status: inpatient    Time spent: 60 minutes  Berton Mount, MD  [facility] Pager #: [phone]. 7PM-7AM contact night coverage as above  08/03/2015, 11:37 AM

## 2015-08-04 DIAGNOSIS — S72002A Fracture of unspecified part of neck of left femur, initial encounter for closed fracture: Secondary | ICD-10-CM

## 2015-08-04 DIAGNOSIS — M810 Age-related osteoporosis without current pathological fracture: Secondary | ICD-10-CM | POA: Diagnosis present

## 2015-08-04 DIAGNOSIS — D62 Acute posthemorrhagic anemia: Secondary | ICD-10-CM

## 2015-08-04 DIAGNOSIS — I10 Essential (primary) hypertension: Secondary | ICD-10-CM

## 2015-08-04 LAB — CBC
HCT: 28.7 % — ABNORMAL LOW (ref 36.0–46.0)
Hemoglobin: 9.2 g/dL — ABNORMAL LOW (ref 12.0–15.0)
MCH: 27.7 pg (ref 26.0–34.0)
MCHC: 32.1 g/dL (ref 30.0–36.0)
MCV: 86.4 fL (ref 78.0–100.0)
PLATELETS: 229 10*3/uL (ref 150–400)
RBC: 3.32 MIL/uL — ABNORMAL LOW (ref 3.87–5.11)
RDW: 15.5 % (ref 11.5–15.5)
WBC: 11.6 10*3/uL — ABNORMAL HIGH (ref 4.0–10.5)

## 2015-08-04 LAB — BASIC METABOLIC PANEL
Anion gap: 7 (ref 5–15)
BUN: 15 mg/dL (ref 6–20)
CHLORIDE: 109 mmol/L (ref 101–111)
CO2: 20 mmol/L — AB (ref 22–32)
CREATININE: 0.78 mg/dL (ref 0.44–1.00)
Calcium: 7.3 mg/dL — ABNORMAL LOW (ref 8.9–10.3)
GFR calc Af Amer: >60 mL/min (ref >60)
GFR calc non Af Amer: >60 mL/min (ref >60)
Glucose, Bld: 97 mg/dL (ref 65–99)
Potassium: 3.9 mmol/L (ref 3.5–5.1)
SODIUM: 136 mmol/L (ref 135–145)

## 2015-08-04 MED ORDER — SALINE SPRAY 0.65 % NA SOLN
1.0000 | NASAL | Status: DC | PRN
  Filled 2015-08-04: qty 44

## 2015-08-04 MED ORDER — FERROUS SULFATE 325 (65 FE) MG PO TABS
325.0000 mg | ORAL_TABLET | Freq: Two times a day (BID) | ORAL | Status: DC
  Administered 2015-08-04 – 2015-08-06 (×5): 325 mg via ORAL
  Filled 2015-08-04 (×5): qty 1

## 2015-08-04 MED ORDER — POLYETHYLENE GLYCOL 3350 17 G PO PACK
17.0000 g | PACK | Freq: Every day | ORAL | Status: DC
  Administered 2015-08-04 – 2015-08-05 (×2): 17 g via ORAL
  Filled 2015-08-04 (×3): qty 1

## 2015-08-04 MED ORDER — AMLODIPINE BESYLATE 5 MG PO TABS
5.0000 mg | ORAL_TABLET | Freq: Every day | ORAL | Status: DC
  Administered 2015-08-05 – 2015-08-06 (×2): 5 mg via ORAL
  Filled 2015-08-04 (×2): qty 1

## 2015-08-04 NOTE — Evaluation (Addendum)
Occupational Therapy Evaluation Patient Details Name: Monica LuoMary L Conway MRN: [id number] DOB: 1945-10-24 Today's Date: 08/04/2015    History of Present Illness Pt s/p fall and sustained a L hip fracture. Pt underwent L DTHA on 08/03/15. PMH includes osteoporosis, HTN, colon sx and asthma.    Clinical Impression   [facility] and min assist on eval. Pt has 24/7 assist available by family and [facility] but is open to rehab if needed. As long as pt can progress to a level to safely d/c home with family, recommend HHOT, HH aide, and 24/7. [facility]not transfer into bathroom) due to L LE feeling like it was giving out.    Follow Up Recommendations  Supervision/Assistance - 24 hour;Home health OT    Equipment Recommendations  3 in 1 bedside comode    Recommendations for Other Services       Precautions / Restrictions Precautions Precautions: Fall Restrictions Weight Bearing Restrictions: No Other Position/Activity Restrictions: WBAT      Mobility Bed Mobility Overal bed mobility: Needs Assistance Bed Mobility: Supine to Sit     Supine to sit: Mod assist     General bed mobility comments: assist to support trunk to upright and to help bring L LE over to EOB. cues for how to self assist.   Transfers Overall transfer level: Needs assistance Equipment used: Rolling walker (2 wheeled) Transfers: Sit to/from Stand Sit to Stand: Min assist         General transfer comment: cues for hand placement.     Balance                                            ADL Overall ADL's : Needs assistance/impaired Eating/Feeding: Independent;Sitting   Grooming: Wash/dry hands;Set up;Sitting   Upper Body Bathing: Set up;Sitting   Lower Body Bathing: Moderate assistance;Sit to/from stand   Upper Body Dressing : Set up;Sitting   Lower Body Dressing: Moderate assistance;Sit to/from stand    Toilet Transfer: Minimal assistance;Stand-pivot;BSC;RW   Toileting- Clothing Manipulation and Hygiene: Moderate assistance;Sit to/from stand         General ADL Comments: pt states she has 24/7 assist by several family members and is considering hiring some assist to help at home if she d/c home. She states she is open to rehab if needed and has been tor rehab in the past. pt stood from EOb and [facility] but states her L LE is weak and she cant stand long on it. O2 sats on RA during activity at 96% and informed nursing who stated ok to leave O2 off. Pt only able to stand and pivot for short duration currently and did not feel she could ambulate further today.     Vision     Perception     Praxis      Pertinent Vitals/Pain Pain Assessment: 0-10 Pain Score: 5  Pain Location: L hip Pain Descriptors / Indicators: Discomfort Pain Intervention(s): Monitored during session;Repositioned     Hand Dominance     Extremity/Trunk Assessment Upper Extremity Assessment Upper Extremity Assessment: Overall WFL for tasks assessed           Communication Communication Communication: No difficulties   Cognition Arousal/Alertness: Awake/alert Behavior During Therapy: WFL for tasks assessed/performed Overall Cognitive Status: Within Functional  Limits for tasks assessed                     General Comments       Exercises       Shoulder Instructions      Home Living Family/patient expects to be discharged to:: Private residence Living Arrangements: Alone Available Help at Discharge: Family;Available 24 hours/day Type of Home: House Home Access: Stairs to enter [employer] of Steps: 1   Home Layout: One level         Bathroom Toilet: Standard     Home Equipment: Walker - 2 wheels   Additional Comments: pt was independent PTA and still working.      Prior Functioning/Environment Level of Independence: Independent        Comments:  information taken from previ    OT Diagnosis: Generalized weakness   OT Problem List: Decreased strength;Decreased knowledge of use of DME or AE   OT Treatment/Interventions: Self-care/ADL training;DME and/or AE instruction;Therapeutic activities;Patient/family education    OT Goals(Current goals can be found in the care plan section) Acute Rehab OT Goals Patient Stated Goal: wants to return to work and independence. OT Goal Formulation: With patient Time For Goal Achievement: 08/11/15 Potential to Achieve Goals: Good  OT Frequency: Min 2X/week   Barriers to D/C:            Co-evaluation              End of Session Equipment Utilized During Treatment: Rolling walker Nurse Communication: Mobility status  Activity Tolerance: Patient tolerated treatment well Patient left: in chair;with call bell/phone within reach;with chair alarm set   Time: 1335-1401 OT Time Calculation (min): 26 min Charges:  OT General Charges $OT Visit: 1 Procedure OT Evaluation $OT Eval Low Complexity: 1 Procedure OT Treatments $Therapeutic Activity: 8-22 mins G-Codes:    Lennox Laity  [phone] 08/04/2015, 2:24 PM

## 2015-08-04 NOTE — Evaluation (Signed)
Physical Therapy Evaluation Patient Details Name: Monica Conway MRN: [id number] DOB: [birth year] Today's Date: 08/04/2015   History of Present Illness  Pt s/p fall and sustained a L hip fracture. Pt underwent L anterior appoach THA on 08/03/15. PMH includes osteoporosis, HTN, colon sx and asthma.   Clinical Impression  Monica Conway is having some difficutly with initial ambulation due feeling left leg is not strong enough yet .  She does not want to "overdo" it.  She also feels that left leg is longer than right and she needs to adjust to that. Feel she would benefit from a short term SNF for rehab.  Pt wants to wait until she "sees how she does" until she makes her decision about follow up.     Follow Up Recommendations SNF    Equipment Recommendations  Rolling walker with 5" wheels    Recommendations for Other Services OT consult     Precautions / Restrictions Precautions Precautions: Fall Restrictions Weight Bearing Restrictions: No Other Position/Activity Restrictions: WBAT      Mobility  Bed Mobility Overal bed mobility: Needs Assistance Bed Mobility: Sit to Supine     Supine to sit: Mod assist     General bed mobility comments: needs assist to lift leg up onto bed  and to adjust linens   Transfers Overall transfer level: Needs assistance Equipment used: Rolling walker (2 wheeled) Transfers: Sit to/from Stand Sit to Stand: Min assist         General transfer comment: performed repeated sit to stand x 3 reps for strengthening.  pt limited by feelings of weakness in elft leg   Ambulation/Gait Ambulation/Gait assistance: Min assist Ambulation Distance (Feet): 20 Feet Assistive device: Rolling walker (2 wheeled) Gait Pattern/deviations: Step-to pattern;Decreased step length - right;Decreased step length - left Gait velocity: slow, pt self limits her distance does not want to walk out in hallway    General Gait Details: frequent verbal cues for  sequence  Pt notices  that her leg feels like "it wants to go out to the side"  "it feels uneven"  Pt encouraged to bear weight on left leg   Stairs            Wheelchair Mobility    Modified Rankin (Stroke Patients Only)       Balance Overall balance assessment: No apparent balance deficits (not formally assessed)                                           Pertinent Vitals/Pain Pain Assessment: 0-10 Pain Score: 5  Pain Location: L hip Pain Descriptors / Indicators: Discomfort Pain Intervention(s): Limited activity within patient's tolerance;Monitored during session    Home Living Family/patient expects to be discharged to:: Private residence Living Arrangements: Alone Available Help at Discharge: Family;Available 24 hours/day Type of Home: House Home Access: Stairs to enter   [employer] of Steps: 1 Home Layout: One level Home Equipment: Walker - 2 wheels Additional Comments: pt was independent PTA and still working.    Prior Function Level of Independence: Independent         Comments: information taken from previ     Hand Dominance        Extremity/Trunk Assessment   Upper Extremity Assessment: Overall WFL for tasks assessed  Communication   Communication: No difficulties  Cognition Arousal/Alertness: Awake/alert Behavior During Therapy: WFL for tasks assessed/performed Overall Cognitive Status: Within Functional Limits for tasks assessed                      General Comments General comments (skin integrity, edema, etc.): Pt appears to have left log longer than right and notices this difference in ambulation attempts    Exercises Total Joint Exercises Ankle Circles/Pumps: AROM;Right;Left;Both Long Arc Quad: AROM;Left;10 reps Knee Flexion: AROM;Left (stopped because of pain in left groin.)      Assessment/Plan    PT Assessment Patient needs continued PT services  PT Diagnosis Difficulty  walking;Abnormality of gait;Generalized weakness;Acute pain   PT Problem List Decreased strength;Decreased range of motion;Decreased activity tolerance;Decreased mobility;Decreased knowledge of use of DME;Decreased safety awareness;Pain  PT Treatment Interventions DME instruction;Gait training;Stair training;Functional mobility training;Therapeutic activities;Therapeutic exercise;Patient/family education   PT Goals (Current goals can be found in the Care Plan section) Acute Rehab PT Goals Patient Stated Goal: wants to return to work and independence. PT Goal Formulation: With patient Time For Goal Achievement: 08/18/15 Potential to Achieve Goals: Good    Frequency BID   Barriers to discharge Decreased caregiver support pt wants to consider short term SNF for rehab     Co-evaluation               End of Session Equipment Utilized During Treatment: Gait belt Activity Tolerance: Patient limited by fatigue (Pt says she does not want to "overdo it") Patient left: in bed;with call bell/phone within reach           Time: 1430-1500 PT Time Calculation (min) (ACUTE ONLY): 30 min   Charges:   PT Evaluation $PT Eval Low Complexity: 1 Procedure     PT G Codes:       Teresa K. Manson PasseyBrown, PT  Donnetta HailBrown, Teresa Krall 08/04/2015, 3:10 PM

## 2015-08-04 NOTE — Progress Notes (Signed)
Subjective: 1 Day Post-Op Procedure(s) (LRB): TOTAL LEFT HIP ARTHROPLASTY ANTERIOR APPROACH (Left) Patient reports pain as well controlled. Reports stiffness. Tolerating PO's. Reports a good night.   Denies SOB,CP, or calf pain.  Objective: Vital signs in last 24 hours: Temp:  [97.5 F (36.4 C)-98.5 F (36.9 C)] 97.9 F (36.6 C) (06/10 0541) Pulse Rate:  [75-111] 75 (06/10 0541) Resp:  [13-20] 17 (06/09 2115) BP: (104-155)/(56-95) 131/61 mmHg (06/10 0541) SpO2:  [91 %-98 %] 91 % (06/10 0541) Weight:  [61.236 kg (135 lb)] 61.236 kg (135 lb) (06/09 1000)  Intake/Output from previous day: 06/09 0701 - 06/10 0700 In: 2180 [P.O.:480; I.V.:1700] Out: 1625 [Urine:1225; Blood:400] Intake/Output this shift:     Recent Labs  08/03/15 0625 08/04/15 0320  HGB 12.5 9.2*    Recent Labs  08/03/15 0625 08/04/15 0320  WBC 11.8* 11.6*  RBC 4.30 3.32*  HCT 38.0 28.7*  PLT 314 229    Recent Labs  08/03/15 0625 08/04/15 0320  NA 134* 136  K 3.7 3.9  CL 105 109  CO2 17* 20*  BUN 21* 15  CREATININE 1.07* 0.78  GLUCOSE 115* 97  CALCIUM 8.7* 7.3*    Recent Labs  08/03/15 0625  INR 0.90    Alert and oriented x3. RRR, Lungs clear, BS x4. Left Calf soft and non tender. L hip dressing C/D/I. No DVT signs. No signs of infection or compartment syndrome. LLE grossly neurovascularly intact.   Assessment/Plan: 1 Day Post-Op Procedure(s) (LRB): TOTAL LEFT HIP ARTHROPLASTY ANTERIOR APPROACH (Left) Up with PT WBAT with assist devices D/c planning ASA 81mg  bid Continue current care  Makenzye Troutman L 08/04/2015, 7:48 AM

## 2015-08-04 NOTE — Progress Notes (Signed)
[facility] Progress Note  Patient: Monica Conway ZOX:[id number]   PCP: Roxanne Mins, PA-C DOB: 05-30-1945   DOA: 08/03/2015   DOS: 08/04/2015   Date of Service: the patient was seen and examined on 08/04/2015  Subjective: Patient's pain is well tolerated. No nausea no vomiting. No shortness of breath reported, does not use oxygen at her baseline Nutrition: Tolerating oral diet  Brief hospital course: Patient was admitted on 08/03/2015, with complaint of a mechanical fall, was found to have left displaced femoral neck fracture. Patient underwent total hip arthroplasty Currently further plan is continue monitoring hemoglobin.  Assessment and Plan: 1. Left displaced femoral neck fracture (HCC) Status post left total hip arthroplasty. Pain is well-controlled. No hematoma noted acute. Identified. Continue incentive spirometry and wean oxygen to room air.  2. Postoperative blood loss anemia. Hemoglobin drops significantly. We'll continue to monitor. Continue aspirin at present. Continue iron supplementation  3. Essential hypertension. Continue amlodipine, hold lisinopril.  Pain management: When necessary Norco, when necessary Robaxin Activity: Consulted physical therapy Bowel regimen: last BM prior to admission Diet: Cardiac diet DVT Prophylaxis: subcutaneous Heparin  Advance goals of care discussion: Full code  Family Communication: no family was present at bedside, at the time of interview.   Disposition:  Discharge to home, likely home health Expected discharge date: 08/05/2015  Consultants: Orthopedics Procedures: Total hip arthroplasty left 08/03/2015  Antibiotics: Anti-infectives    Start     Dose/Rate Route Frequency Ordered Stop   08/04/15 0000  ceFAZolin (ANCEF) IVPB 2g/100 mL premix     2 g 200 mL/hr over 30 Minutes Intravenous Every 6 hours 08/03/15 2133 08/04/15 0629   08/03/15 1630  ceFAZolin (ANCEF) IVPB 2g/100 mL premix     2 g 200 mL/hr over 30  Minutes Intravenous On call to O.R. 08/03/15 1049 08/03/15 1815   08/03/15 1045  ceFAZolin (ANCEF) IVPB 2g/100 mL premix  Status:  Discontinued     2 g 200 mL/hr over 30 Minutes Intravenous On call to O.R. 08/03/15 1033 08/03/15 1050   08/03/15 1045  ceFAZolin (ANCEF) 3 g in dextrose 5 % 50 mL IVPB  Status:  Discontinued     3 g 130 mL/hr over 30 Minutes Intravenous On call to O.R. 08/03/15 1033 08/03/15 1038        Intake/Output Summary (Last 24 hours) at 08/04/15 1430 Last data filed at 08/04/15 1300  Gross per 24 hour  Intake   2300 ml  Output   2025 ml  Net    275 ml   Filed Weights   08/03/15 1000  Weight: 61.236 kg (135 lb)    Objective: Physical Exam: Filed Vitals:   08/03/15 2310 08/04/15 0541 08/04/15 1305 08/04/15 1350  BP: 127/63 131/61 134/77   Pulse: 76 75 85   Temp: 98 F (36.7 C) 97.9 F (36.6 C) 98.5 F (36.9 C)   TempSrc: Oral Oral Oral   Resp:   16   Height:      Weight:      SpO2: 96% 91% 98% 96%    General: Alert, Awake and Oriented to Time, Place and Person. Appear in mild distress Eyes: PERRL, Conjunctiva normal ENT: Oral Mucosa clear moist. Neck: no JVD, no Abnormal Mass Or lumps Cardiovascular: S1 and S2 Present, no Murmur, Peripheral Pulses Present Respiratory: Bilateral Air entry equal and Decreased, Clear to Auscultation, no Crackles, no wheezes Abdomen: Bowel Sound present, Soft and no tenderness Skin: no redness, no Rash  Extremities: no Pedal edema, no  calf tenderness Neurologic: Grossly no focal neuro deficit. Bilaterally Equal motor strength  Data Reviewed: CBC:  Recent Labs Lab 08/03/15 0625 08/04/15 0320  WBC 11.8* 11.6*  NEUTROABS 9.8*  --   HGB 12.5 9.2*  HCT 38.0 28.7*  MCV 88.4 86.4  PLT 314 229   Basic Metabolic Panel:  Recent Labs Lab 08/03/15 0625 08/04/15 0320  NA 134* 136  K 3.7 3.9  CL 105 109  CO2 17* 20*  GLUCOSE 115* 97  BUN 21* 15  CREATININE 1.07* 0.78  CALCIUM 8.7* 7.3*    Liver Function  Tests: No results for input(s): AST, ALT, ALKPHOS, BILITOT, PROT, ALBUMIN in the last 168 hours. No results for input(s): LIPASE, AMYLASE in the last 168 hours. No results for input(s): AMMONIA in the last 168 hours. Coagulation Profile:  Recent Labs Lab 08/03/15 0625  INR 0.90   Cardiac Enzymes: No results for input(s): CKTOTAL, CKMB, CKMBINDEX, TROPONINI in the last 168 hours. BNP (last 3 results) No results for input(s): PROBNP in the last 8760 hours.  CBG: No results for input(s): GLUCAP in the last 168 hours.  Studies: Ct Knee Left Wo Contrast  08/03/2015  CLINICAL DATA:  Recent slip and fall with left knee pain and questionable tibial plateau fracture on recent plain film. EXAM: CT OF THE left KNEE WITHOUT CONTRAST TECHNIQUE: Multidetector CT imaging of the left knee was performed according to the standard protocol. Multiplanar CT image reconstructions were also generated. COMPARISON:  Plain film from earlier in the same day. FINDINGS: A small suprapatellar effusion is noted. The suprapatellar and infrapatellar ligaments are intact. Degenerative changes are noted in all 3 joint compartments but worst in the medial joint compartment. No tibial plateau fracture or femoral condyle fracture is noted. The menisci and cruciate ligaments are not well appreciated on this exam. The lateral collateral and medial collateral ligaments appear intact. No other focal abnormality is seen. IMPRESSION: No acute fracture noted. Degenerative changes and small joint effusion. Electronically Signed   By: Alcide Clever M.D.   On: 08/03/2015 17:31   Pelvis Portable  08/03/2015  CLINICAL DATA:  Postop for left hip replacement. EXAM: PORTABLE PELVIS 1-2 VIEWS COMPARISON:  Intraoperative imaging of same date. FINDINGS: Left hip arthroplasty. Expected subcutaneous gas about the joint. No periprosthetic fracture. Right femoral head located. IMPRESSION: Expected appearance after left hip arthroplasty. Electronically  Signed   By: Jeronimo Greaves M.D.   On: 08/03/2015 21:11   Dg C-arm 61-120 Min-no Report  08/03/2015  CLINICAL DATA: surgery C-ARM 61-120 MINUTES Fluoroscopy was utilized by the requesting physician.  No radiographic interpretation.   Dg Hip Operative Unilat With Pelvis Left  08/03/2015  CLINICAL DATA:  Left hip arthroplasty. EXAM: DG C-ARM 61-120 MIN-NO REPORT; OPERATIVE LEFT HIP WITH PELVIS COMPARISON:  08/03/2015 FINDINGS: 2 intraoperative views demonstrating left hip arthroplasty. No acute hardware complication or periprosthetic fracture. IMPRESSION: Intraoperative imaging of left hip arthroplasty. Electronically Signed   By: Jeronimo Greaves M.D.   On: 08/03/2015 20:41     Scheduled Meds: . aspirin EC  81 mg Oral BID WC  . docusate sodium  100 mg Oral BID  . ferrous sulfate  325 mg Oral BID WC  . metoprolol tartrate  25 mg Oral TID  . multivitamin with minerals  1 tablet Oral Daily  . pantoprazole  40 mg Oral Daily  . polyethylene glycol  17 g Oral Daily  . senna  1 tablet Oral BID   Continuous Infusions:  PRN Meds:  acetaminophen **OR** acetaminophen, alum & mag hydroxide-simeth, HYDROcodone-acetaminophen, magnesium citrate, menthol-cetylpyridinium **OR** phenol, methocarbamol **OR** methocarbamol (ROBAXIN)  IV, metoCLOPramide **OR** metoCLOPramide (REGLAN) injection, ondansetron **OR** ondansetron (ZOFRAN) IV, sodium chloride, sorbitol  Time spent: 30 minutes  Author: Lynden OxfordPranav Yong Grieser, MD [facility] Pager: [phone] 08/04/2015 2:30 PM  If 7PM-7AM, please contact night-coverage at [email or web address], password [email or web address]

## 2015-08-05 LAB — BASIC METABOLIC PANEL
Anion gap: 6 (ref 5–15)
BUN: 9 mg/dL (ref 6–20)
CHLORIDE: 110 mmol/L (ref 101–111)
CO2: 20 mmol/L — ABNORMAL LOW (ref 22–32)
CREATININE: 0.69 mg/dL (ref 0.44–1.00)
Calcium: 7.5 mg/dL — ABNORMAL LOW (ref 8.9–10.3)
Glucose, Bld: 98 mg/dL (ref 65–99)
POTASSIUM: 3.4 mmol/L — AB (ref 3.5–5.1)
SODIUM: 136 mmol/L (ref 135–145)

## 2015-08-05 LAB — CBC
HCT: 28.4 % — ABNORMAL LOW (ref 36.0–46.0)
HEMOGLOBIN: 9.1 g/dL — AB (ref 12.0–15.0)
MCH: 28.3 pg (ref 26.0–34.0)
MCHC: 32 g/dL (ref 30.0–36.0)
MCV: 88.5 fL (ref 78.0–100.0)
Platelets: 194 10*3/uL (ref 150–400)
RBC: 3.21 MIL/uL — AB (ref 3.87–5.11)
RDW: 15.4 % (ref 11.5–15.5)
WBC: 9.9 10*3/uL (ref 4.0–10.5)

## 2015-08-05 MED ORDER — HYDROCODONE-ACETAMINOPHEN 5-325 MG PO TABS: 1.0000 | ORAL_TABLET | Freq: Four times a day (QID) | ORAL | Status: DC | PRN

## 2015-08-05 MED ORDER — POTASSIUM CHLORIDE CRYS ER 20 MEQ PO TBCR
40.0000 meq | EXTENDED_RELEASE_TABLET | Freq: Once | ORAL | Status: AC
  Administered 2015-08-05: 40 meq via ORAL
  Filled 2015-08-05: qty 2

## 2015-08-05 MED ORDER — ASPIRIN 81 MG PO TBEC: 81.0000 mg | DELAYED_RELEASE_TABLET | Freq: Two times a day (BID) | ORAL | Status: DC

## 2015-08-05 MED ORDER — POLYVINYL ALCOHOL 1.4 % OP SOLN
1.0000 [drp] | OPHTHALMIC | Status: DC | PRN
  Filled 2015-08-05: qty 15

## 2015-08-05 NOTE — Progress Notes (Signed)
Physical Therapy Treatment Patient Details Name: Monica LuoMary L Conway MRN: [id number] DOB: 1945-06-19 Today's Date: 08/05/2015    History of Present Illness Pt s/p fall and sustained a L hip fracture. Pt underwent L anterior appoach THA on 08/03/15. PMH includes osteoporosis, HTN, colon sx and asthma.     PT Comments    Progressing slowly with mobility. Pain rated ~6/10 with activity. Continue to recommend SNF for rehab.   Follow Up Recommendations  SNF     Equipment Recommendations  Rolling walker with 5" wheels    Recommendations for Other Services       Precautions / Restrictions Precautions Precautions: Fall Restrictions Weight Bearing Restrictions: No Other Position/Activity Restrictions: WBAT    Mobility  Bed Mobility             General bed mobility comments: oob in recliner  Transfers Overall transfer level: Needs assistance Equipment used: Rolling walker (2 wheeled) Transfers: Sit to/from Stand Sit to Stand: Min guard         General transfer comment: close guard for safety. VCs safety, technqiue, hand/LE placement  Ambulation/Gait Ambulation/Gait assistance: Min assist Ambulation Distance (Feet): 22 Feet Assistive device: Rolling walker (2 wheeled) Gait Pattern/deviations: Step-to pattern;Antalgic     General Gait Details: VCs safety, technique, sequence. Slow gait speed. Pt fatigues and gets anxious fairly easily. Pt continues to c/o LEs "feeling uneven".   Stairs            Wheelchair Mobility    Modified Rankin (Stroke Patients Only)       Balance                                    Cognition Arousal/Alertness: Awake/alert Behavior During Therapy: WFL for tasks assessed/performed Overall Cognitive Status: Within Functional Limits for tasks assessed                      Exercises Total Joint Exercises Ankle Circles/Pumps: AROM;Both;10 reps;Supine Quad Sets: AROM;Both;10 reps Heel Slides: AAROM;Left;10  reps;Supine Hip ABduction/ADduction: AAROM;Left;10 reps;Supine    General Comments        Pertinent Vitals/Pain Pain Assessment: 0-10 Pain Score: 6  Pain Location: L hip/thigh with activity Pain Descriptors / Indicators: Sore Pain Intervention(s): Limited activity within patient's tolerance;Ice applied;Repositioned    Home Living Family/patient expects to be discharged to:: Skilled nursing facility Living Arrangements: Alone             Additional Comments: has a tub at home    Prior Function Level of Independence: Independent          PT Goals (current goals can now be found in the care plan section) Acute Rehab PT Goals Patient Stated Goal: wants to return to work and independence. Progress towards PT goals: Progressing toward goals    Frequency  Min 4X/week    PT Plan Current plan remains appropriate    Co-evaluation             End of Session Equipment Utilized During Treatment: Gait belt Activity Tolerance: Patient limited by fatigue;Patient limited by pain Patient left: in chair;with call bell/phone within reach;with chair alarm set     Time: [id number] PT Time Calculation (min) (ACUTE ONLY): 19 min  Charges:  $Gait Training: 8-22 mins                    G Codes:      Aura CampsJannie  Starleen Blue, MPT Pager: [phone]

## 2015-08-05 NOTE — Anesthesia Postprocedure Evaluation (Signed)
Anesthesia Post Note  Patient: Monica Conway  Procedure(s) Performed: Procedure(s) (LRB): TOTAL LEFT HIP ARTHROPLASTY ANTERIOR APPROACH (Left)  Patient location during evaluation: PACU Anesthesia Type: Spinal Level of consciousness: awake Pain management: pain level controlled Vital Signs Assessment: post-procedure vital signs reviewed and stable Respiratory status: spontaneous breathing Cardiovascular status: stable Postop Assessment: spinal receding Anesthetic complications: no    Last Vitals:  Filed Vitals:   08/04/15 2050 08/05/15 0557  BP: 122/70 145/65  Pulse: 83 84  Temp: 37 C 36.4 C  Resp: 16 16    Last Pain:  Filed Vitals:   08/05/15 0831  PainSc: 4                  Donyea Gafford

## 2015-08-05 NOTE — Progress Notes (Signed)
   Subjective: 2 Days Post-Op Procedure(s) (LRB): TOTAL LEFT HIP ARTHROPLASTY ANTERIOR APPROACH (Left) Patient reports pain as mild.   Patient seen in rounds for Dr. Linna CapriceSwinteck. Patient is well, but has had some minor complaints of increased stiffness in her left leg this morning. No issues ovenight. NO SOB or chest pain   Objective: Vital signs in last 24 hours: Temp:  [97.6 F (36.4 C)-98.6 F (37 C)] 97.6 F (36.4 C) (06/11 0557) Pulse Rate:  [83-85] 84 (06/11 0557) Resp:  [16] 16 (06/11 0557) BP: (122-145)/(65-77) 145/65 mmHg (06/11 0557) SpO2:  [92 %-98 %] 92 % (06/11 0557)  Intake/Output from previous day:  Intake/Output Summary (Last 24 hours) at 08/05/15 0829 Last data filed at 08/04/15 1752  Gross per 24 hour  Intake    360 ml  Output    500 ml  Net   -140 ml     Labs:  Recent Labs  08/03/15 0625 08/04/15 0320 08/05/15 0401  HGB 12.5 9.2* 9.1*    Recent Labs  08/04/15 0320 08/05/15 0401  WBC 11.6* 9.9  RBC 3.32* 3.21*  HCT 28.7* 28.4*  PLT 229 194    Recent Labs  08/04/15 0320 08/05/15 0401  NA 136 136  K 3.9 3.4*  CL 109 110  CO2 20* 20*  BUN 15 9  CREATININE 0.78 0.69  GLUCOSE 97 98  CALCIUM 7.3* 7.5*    Recent Labs  08/03/15 0625  INR 0.90    EXAM General - Patient is Alert and Oriented Extremity - Neurologically intact Intact pulses distally Dorsiflexion/Plantar flexion intact No cellulitis present Compartment soft Dressing/Incision - clean, dry, no drainage Motor Function - intact, moving foot and toes well on exam.   Past Medical History  Diagnosis Date  . Arthritis   . Asthma   . Blood transfusion without reported diagnosis   . Hypertension   . Osteoporosis     Assessment/Plan: 2 Days Post-Op Procedure(s) (LRB): TOTAL LEFT HIP ARTHROPLASTY ANTERIOR APPROACH (Left) Principal Problem:   Left displaced femoral neck fracture (HCC) Active Problems:   Hypertension   Osteoporosis   Postoperative anemia due to  acute blood loss  Estimated body mass index is 26.37 kg/(m^2) as calculated from the following:   Height as of this encounter: 5' (1.524 m).   Weight as of this encounter: 61.236 kg (135 lb). Advance diet Up with therapy  DVT Prophylaxis - Aspirin Weight-Bearing as tolerated   Doing well. Continue PT. Plan for DC when medically stable.   Dimitri PedAmber Iyad Deroo, PA-C Orthopaedic Surgery 08/05/2015, 8:29 AM

## 2015-08-05 NOTE — Progress Notes (Signed)
[facility] Progress Note  Patient: Monica LuoMary L Cauthon ONG:[id number]   PCP: Roxanne Minsuran, Michael, PA-C DOB: 1945-03-16   DOA: 08/03/2015   DOS: 08/05/2015   Date of Service: the patient was seen and examined on 08/05/2015  Subjective: Patient remains well controlled. Patient has been off the oxygen and tolerating it well. No shortness of breath no chest pain no nausea no vomiting. Nutrition: Tolerating oral diet  Brief hospital course: Patient was admitted on 08/03/2015, with complaint of a mechanical fall, was found to have left displaced femoral neck fracture. Patient underwent total hip arthroplasty. Currently further plan is continue monitoring hemoglobin.  Assessment and Plan: 1. Left displaced femoral neck fracture (HCC) Status post left total hip arthroplasty. Pain is well-controlled. No hematoma noted. Continue incentive spirometry.  2. Postoperative blood loss anemia. Hemoglobin drops significantly but remained stable. We'll continue to monitor. Continue aspirin at present. Continue iron supplementation  3. Essential hypertension. Continue amlodipine, hold lisinopril.  Pain management: When necessary Norco, when necessary Robaxin Activity: SNF per physical therapy Bowel regimen: last BM 08/04/2015 Diet: Cardiac diet DVT Prophylaxis: subcutaneous Heparin  Advance goals of care discussion: Full code  Family Communication: no family was present at bedside, at the time of interview.   Disposition:  Discharge to home, likely home health Expected discharge date: 08/06/2015  Consultants: Orthopedics Procedures: Total hip arthroplasty left 08/03/2015  Antibiotics: Anti-infectives    Start     Dose/Rate Route Frequency Ordered Stop   08/04/15 0000  ceFAZolin (ANCEF) IVPB 2g/100 mL premix     2 g 200 mL/hr over 30 Minutes Intravenous Every 6 hours 08/03/15 2133 08/04/15 0629   08/03/15 1630  ceFAZolin (ANCEF) IVPB 2g/100 mL premix     2 g 200 mL/hr over 30 Minutes Intravenous  On call to O.R. 08/03/15 1049 08/03/15 1815   08/03/15 1045  ceFAZolin (ANCEF) IVPB 2g/100 mL premix  Status:  Discontinued     2 g 200 mL/hr over 30 Minutes Intravenous On call to O.R. 08/03/15 1033 08/03/15 1050   08/03/15 1045  ceFAZolin (ANCEF) 3 g in dextrose 5 % 50 mL IVPB  Status:  Discontinued     3 g 130 mL/hr over 30 Minutes Intravenous On call to O.R. 08/03/15 1033 08/03/15 1038        Intake/Output Summary (Last 24 hours) at 08/05/15 1933 Last data filed at 08/05/15 1846  Gross per 24 hour  Intake    480 ml  Output    350 ml  Net    130 ml   Filed Weights   08/03/15 1000  Weight: 61.236 kg (135 lb)    Objective: Physical Exam: Filed Vitals:   08/04/15 1350 08/04/15 2050 08/05/15 0557 08/05/15 1255  BP:  122/70 145/65 141/86  Pulse:  83 84 93  Temp:  98.6 F (37 C) 97.6 F (36.4 C) 98.4 F (36.9 C)  TempSrc:  Oral Oral Oral  Resp:  16 16 18   Height:      Weight:      SpO2: 96% 92% 92% 96%    General: Alert, Awake and Oriented to Time, Place and Person. Appear in mild distress Eyes: PERRL, Conjunctiva normal ENT: Oral Mucosa clear moist. Neck: no JVD, no Abnormal Mass Or lumps Cardiovascular: S1 and S2 Present, no Murmur, Peripheral Pulses Present Respiratory: Bilateral Air entry equal and Decreased, Clear to Auscultation, no Crackles, no wheezes Abdomen: Bowel Sound present, Soft and no tenderness Skin: no redness, no Rash  Extremities: no Pedal edema, no  calf tenderness Neurologic: Grossly no focal neuro deficit. Bilaterally Equal motor strength  Data Reviewed: CBC:  Recent Labs Lab 08/03/15 0625 08/04/15 0320 08/05/15 0401  WBC 11.8* 11.6* 9.9  NEUTROABS 9.8*  --   --   HGB 12.5 9.2* 9.1*  HCT 38.0 28.7* 28.4*  MCV 88.4 86.4 88.5  PLT 314 229 194   Basic Metabolic Panel:  Recent Labs Lab 08/03/15 0625 08/04/15 0320 08/05/15 0401  NA 134* 136 136  K 3.7 3.9 3.4*  CL 105 109 110  CO2 17* 20* 20*  GLUCOSE 115* 97 98  BUN 21* 15 9   CREATININE 1.07* 0.78 0.69  CALCIUM 8.7* 7.3* 7.5*    Liver Function Tests: No results for input(s): AST, ALT, ALKPHOS, BILITOT, PROT, ALBUMIN in the last 168 hours. No results for input(s): LIPASE, AMYLASE in the last 168 hours. No results for input(s): AMMONIA in the last 168 hours. Coagulation Profile:  Recent Labs Lab 08/03/15 0625  INR 0.90   Cardiac Enzymes: No results for input(s): CKTOTAL, CKMB, CKMBINDEX, TROPONINI in the last 168 hours. BNP (last 3 results) No results for input(s): PROBNP in the last 8760 hours.  CBG: No results for input(s): GLUCAP in the last 168 hours.  Studies: No results found.   Scheduled Meds: . amLODipine  5 mg Oral Daily  . aspirin EC  81 mg Oral BID WC  . docusate sodium  100 mg Oral BID  . ferrous sulfate  325 mg Oral BID WC  . multivitamin with minerals  1 tablet Oral Daily  . polyethylene glycol  17 g Oral Daily  . senna  1 tablet Oral BID   Continuous Infusions:  PRN Meds: acetaminophen **OR** acetaminophen, alum & mag hydroxide-simeth, HYDROcodone-acetaminophen, magnesium citrate, menthol-cetylpyridinium **OR** phenol, methocarbamol **OR** methocarbamol (ROBAXIN)  IV, metoCLOPramide **OR** metoCLOPramide (REGLAN) injection, ondansetron **OR** ondansetron (ZOFRAN) IV, sodium chloride, sorbitol  Time spent: 30 minutes  Author: Lynden Oxford, MD [facility] Pager: [phone] 08/05/2015 7:33 PM  If 7PM-7AM, please contact night-coverage at [email or web address], password [facility]

## 2015-08-05 NOTE — Clinical Social Work Note (Signed)
Clinical Social Work Assessment  Patient Details  Name: Monica LuoMary L Freer MRN: [id number] Date of Birth: 05-03-1945  Date of referral:  08/05/15               Reason for consult:  Facility Placement, Discharge Planning                Permission sought to share information with:  Facility Medical sales representativeContact Representative, Family Supports Permission granted to share information::  Yes, Verbal Permission Granted  Name::     Scientific laboratory technicianobert  Agency::  SNFs  Relationship::  brother  Contact Information:     Housing/Transportation Living arrangements for the past 2 months:  Single Family Home Source of Information:  Patient Patient Interpreter Needed:  None Criminal Activity/Legal Involvement Pertinent to Current Situation/Hospitalization:  No - Comment as needed Significant Relationships:  Siblings, Friend Lives with:  Self Do you feel safe going back to the place where you live?  Yes Need for family participation in patient care:  Yes (Comment)  Care giving concerns:  The patient is in agreement with PT recommendation for SNF.   Social Worker assessment / plan:  CSW me with patient at bedside to complete assessment. The patient states that she agrees to SNF placement at discharge. [facility] in the past. CSW explained SNF search/placement process and answered the patient's questions. The patient has questions about what [facility] will cover, CSW asked that the patient contact her insurance company directly to ask them about SNF coverage. CSW will followup with bed offers.  Employment status:  Scientist, clinical (histocompatibility and immunogenetics)elf-Employed Insurance information:  Managed Medicare PT Recommendations:  Skilled Nursing Facility Information / Referral to community resources:  Skilled Nursing Facility  Patient/Family's Response to care:  The patient appears happy with the care she has received.  Patient/Family's Understanding of and Emotional Response to Diagnosis,  Current Treatment, and Prognosis:  The patient appears to have a good understanding of the reason for her admission and her post discharge needs. She is hopefull she will be able to return home soon. The patient appears to be coping well with this hospitalization and appears to have good support from friends and family.   Emotional Assessment Appearance:  Appears stated age Attitude/Demeanor/Rapport:  Other (Patient is welcoming and appropriate) Affect (typically observed):  Accepting, Appropriate, Calm, Pleasant Orientation:  Oriented to Self, Oriented to Place, Oriented to  Time, Oriented to Situation Alcohol / Substance use:  Not Applicable Psych involvement (Current and /or in the community):  No (Comment)  Discharge Needs  Concerns to be addressed:  Discharge Planning Concerns Readmission within the last 30 days:  No Current discharge risk:  Physical Impairment Barriers to Discharge:  Continued Medical Work up   Venita Lickampbell, Rosette Bellavance B, LCSW 08/05/2015, 3:37 PM

## 2015-08-05 NOTE — [place] FL2 (Signed)
[place] MEDICAID FL2 LEVEL OF CARE SCREENING TOOL     IDENTIFICATION  Patient Name: Monica LuoMary L Dimmer Birthdate: 1945/10/19 Sex: female Admission Date (Current Location): 08/03/2015  [facility] [place]   Facility and Address:  [facility],  [address], [place] [id number]      Provider Number: [id number]  Attending Physician Name and Address:  Rolly SalterPranav M Patel, MD  Relative Name and Phone Number:       Current Level of Care: Hospital Recommended Level of Care: Skilled Nursing Facility Prior Approval Number:    Date Approved/Denied:   PASRR Number: [id number]  Discharge Plan: SNF    Current Diagnoses: Patient Active Problem List   Diagnosis Date Noted  . Postoperative anemia due to acute blood loss 08/04/2015  . Hypertension   . Osteoporosis   . Closed left hip fracture (HCC) 08/03/2015  . Left displaced femoral neck fracture (HCC) 08/03/2015  . Dehydration 02/05/2014  . UTI (lower urinary tract infection) 02/05/2014  . Leukocytosis 02/05/2014  . Sepsis (HCC) 02/05/2014  . Encephalopathy acute 02/04/2014  . SIRS (systemic inflammatory response syndrome) (HCC) 02/04/2014  . Elevated LFTs 02/04/2014    Orientation RESPIRATION BLADDER Height & Weight     Self, Time, Situation, Place  Normal Continent Weight: 61.236 kg (135 lb) Height:  5' (152.4 cm)  BEHAVIORAL SYMPTOMS/MOOD NEUROLOGICAL BOWEL NUTRITION STATUS   (NONE)  (NONE) Continent Diet  AMBULATORY STATUS COMMUNICATION OF NEEDS Skin   Extensive Assist Verbally Surgical wounds (Left hip)                       Personal Care Assistance Level of Assistance  Bathing, Feeding, Dressing Bathing Assistance: Limited assistance Feeding assistance: Independent Dressing Assistance: Limited assistance     Functional Limitations Info  Sight, Hearing, Speech Sight Info: Adequate Hearing Info: Adequate Speech Info: Adequate    SPECIAL CARE FACTORS FREQUENCY  PT (By licensed  PT), OT (By licensed OT)     PT Frequency: 5/week OT Frequency: 5/week            Contractures Contractures Info: Not present    Additional Factors Info  Code Status, Allergies Code Status Info: Full Allergies Info: Pollen extract           Current Medications (08/05/2015):  This is the current hospital active medication list Current Facility-Administered Medications  Medication Dose Route Frequency Provider Last Rate Last Dose  . acetaminophen (TYLENOL) tablet 650 mg  650 mg Oral Q6H PRN Samson FredericBrian Swinteck, MD       Or  . acetaminophen (TYLENOL) suppository 650 mg  650 mg Rectal Q6H PRN Samson FredericBrian Swinteck, MD      . alum & mag hydroxide-simeth (MAALOX/MYLANTA) 200-200-20 MG/5ML suspension 30 mL  30 mL Oral Q4H PRN Samson FredericBrian Swinteck, MD      . amLODipine (NORVASC) tablet 5 mg  5 mg Oral Daily Rolly SalterPranav M Patel, MD   5 mg at 08/05/15 0939  . aspirin EC tablet 81 mg  81 mg Oral BID WC Samson FredericBrian Swinteck, MD   81 mg at 08/05/15 0749  . docusate sodium (COLACE) capsule 100 mg  100 mg Oral BID Samson FredericBrian Swinteck, MD   100 mg at 08/05/15 0939  . ferrous sulfate tablet 325 mg  325 mg Oral BID WC Rolly SalterPranav M Patel, MD   325 mg at 08/05/15 0749  . HYDROcodone-acetaminophen (NORCO/VICODIN) 5-325 MG per tablet 1-2 tablet  1-2 tablet Oral Q6H PRN Samson FredericBrian Swinteck, MD  2 tablet at 08/05/15 1501  . magnesium citrate solution 1 Bottle  1 Bottle Oral Once PRN Samson Frederic, MD      . menthol-cetylpyridinium (CEPACOL) lozenge 3 mg  1 lozenge Oral PRN Samson Frederic, MD       Or  . phenol (CHLORASEPTIC) mouth spray 1 spray  1 spray Mouth/Throat PRN Samson Frederic, MD      . methocarbamol (ROBAXIN) tablet 500 mg  500 mg Oral Q6H PRN Samson Frederic, MD   500 mg at 08/05/15 1309   Or  . methocarbamol (ROBAXIN) 500 mg in dextrose 5 % 50 mL IVPB  500 mg Intravenous Q6H PRN Samson Frederic, MD      . metoCLOPramide (REGLAN) tablet 5-10 mg  5-10 mg Oral Q8H PRN Samson Frederic, MD       Or  . metoCLOPramide (REGLAN) injection  5-10 mg  5-10 mg Intravenous Q8H PRN Samson Frederic, MD      . multivitamin with minerals tablet 1 tablet  1 tablet Oral Daily Barnetta Chapel, MD   1 tablet at 08/05/15 0940  . ondansetron (ZOFRAN) tablet 4 mg  4 mg Oral Q6H PRN Samson Frederic, MD       Or  . ondansetron (ZOFRAN) injection 4 mg  4 mg Intravenous Q6H PRN Samson Frederic, MD      . polyethylene glycol (MIRALAX / GLYCOLAX) packet 17 g  17 g Oral Daily Rolly Salter, MD   17 g at 08/05/15 0940  . senna (SENOKOT) tablet 8.6 mg  1 tablet Oral BID Samson Frederic, MD   8.6 mg at 08/05/15 0939  . sodium chloride (OCEAN) 0.65 % nasal spray 1 spray  1 spray Each Nare PRN Barnetta Chapel, MD      . sorbitol 70 % solution 30 mL  30 mL Oral Daily PRN Samson Frederic, MD         Discharge Medications: Please see discharge summary for a list of discharge medications.  Relevant Imaging Results:  Relevant Lab Results:   Additional Information SSN: [id number]  Venita Lick, LCSW

## 2015-08-05 NOTE — Progress Notes (Signed)
Occupational Therapy Treatment Patient Details Name: Jenna LuoMary L Herrero MRN: [id number] DOB: 05-09-1945 Today's Date: 08/05/2015    History of present illness Pt s/p fall and sustained a L hip fracture. Pt underwent L anterior appoach THA on 08/03/15. PMH includes osteoporosis, HTN, colon sx and asthma.    OT comments  Pt is very motivated.  Performed ADL and 2 SPTs this session.    Follow Up Recommendations  SNF (pt lives alone)    Equipment Recommendations  3 in 1 bedside comode    Recommendations for Other Services      Precautions / Restrictions Precautions Precautions: Fall Restrictions Weight Bearing Restrictions: No Other Position/Activity Restrictions: WBAT       Mobility Bed Mobility   Bed Mobility: Supine to Sit     Supine to sit: Min assist     General bed mobility comments: assist for LLE and to scoot forward on bed  Transfers   Equipment used: Rolling walker (2 wheeled) Transfers: Sit to/from Stand Sit to Stand: Min assist         General transfer comment: steadying assistance    Balance                                   ADL Overall ADL's : Needs assistance/impaired             Lower Body Bathing: Minimal assistance;Sit to/from stand;With adaptive equipment       Lower Body Dressing: Minimal assistance;Sit to/from stand;With adaptive equipment   Toilet Transfer: Minimal assistance   Toileting- Clothing Manipulation and Hygiene: Minimal assistance;Sit to/from stand         General ADL Comments: [facility] as pt had urgency.  Bathed and dressed after this with use of AE.  Pt would benefit from reinforcement with this.  Step by step cues for sequencing.  Reinforced working within pain tolerance; pt was asking about precautions and angles throughout session      Vision                     Perception     Praxis      Cognition   Behavior During Therapy: WFL for tasks assessed/performed Overall  Cognitive Status: Within Functional Limits for tasks assessed                       Extremity/Trunk Assessment  Upper Extremity Assessment Upper Extremity Assessment: Overall WFL for tasks assessed            Exercises     Shoulder Instructions       General Comments      Pertinent Vitals/ Pain       Pain Assessment: 0-10 Pain Score: 4  Pain Location: L with weight bearing and sit to stand Pain Descriptors / Indicators: Sore Pain Intervention(s): Limited activity within patient's tolerance;Monitored during session;Premedicated before session;Repositioned (removed ice)  Home Living Family/patient expects to be discharged to:: Skilled nursing facility Living Arrangements: Alone                               Additional Comments: has a tub at home      Prior Functioning/Environment Level of Independence: Independent            Frequency Min 2X/week     Progress Toward Goals  OT Goals(current  goals can now be found in the care plan section)  Progress towards OT goals: Progressing toward goals  Acute Rehab OT Goals Patient Stated Goal: wants to return to work and independence.  Plan      Co-evaluation                 End of Session     Activity Tolerance Patient tolerated treatment well   Patient Left in chair;with call bell/phone within reach;with chair alarm set   Nurse Communication          Time: [id number] OT Time Calculation (min): 29 min  Charges: OT General Charges $OT Visit: 1 Procedure OT Treatments $Self Care/Home Management : 23-37 mins  Zaydrian Batta 08/05/2015, 10:43 AM Marica Otter, OTR/L [phone] 08/05/2015

## 2015-08-06 ENCOUNTER — Encounter (HOSPITAL_COMMUNITY): Payer: Self-pay | Admitting: Orthopedic Surgery

## 2015-08-06 LAB — CBC
HCT: 29.7 % — ABNORMAL LOW (ref 36.0–46.0)
HEMOGLOBIN: 9.7 g/dL — AB (ref 12.0–15.0)
MCH: 28.9 pg (ref 26.0–34.0)
MCHC: 32.7 g/dL (ref 30.0–36.0)
MCV: 88.4 fL (ref 78.0–100.0)
PLATELETS: 217 10*3/uL (ref 150–400)
RBC: 3.36 MIL/uL — ABNORMAL LOW (ref 3.87–5.11)
RDW: 15.5 % (ref 11.5–15.5)
WBC: 11.2 10*3/uL — ABNORMAL HIGH (ref 4.0–10.5)

## 2015-08-06 MED ORDER — METHOCARBAMOL 500 MG PO TABS: 500.0000 mg | ORAL_TABLET | Freq: Four times a day (QID) | ORAL | Status: DC | PRN

## 2015-08-06 MED ORDER — POLYETHYLENE GLYCOL 3350 17 G PO PACK: 17.0000 g | PACK | Freq: Every day | ORAL | Status: DC

## 2015-08-06 MED ORDER — DOCUSATE SODIUM 100 MG PO CAPS: 100.0000 mg | ORAL_CAPSULE | Freq: Two times a day (BID) | ORAL | Status: DC

## 2015-08-06 NOTE — Care Management Important Message (Signed)
Important Message  Patient Details  Name: Monica Conway MRN: [id number] Date of Birth: 1945/10/11   Medicare Important Message Given:  Yes    Haskell FlirtJamison, Naomii Kreger 08/06/2015, 10:04 AMImportant Message  Patient Details  Name: Monica LuoMary L Conway MRN: [id number] Date of Birth: 1945/10/11   Medicare Important Message Given:  Yes    Haskell FlirtJamison, Lenus Trauger 08/06/2015, 10:04 AM

## 2015-08-06 NOTE — Progress Notes (Signed)
Report called to Jonny RuizJohn, [facility]raiserN [facility]

## 2015-08-06 NOTE — Progress Notes (Signed)
   Subjective:  Patient reports pain as mild to moderate.  No c/o.  Objective:   VITALS:   Filed Vitals:   08/05/15 0557 08/05/15 1255 08/05/15 2040 08/06/15 0422  BP: 145/65 141/86 140/73 137/70  Pulse: 84 93 93 96  Temp: 97.6 F (36.4 C) 98.4 F (36.9 C) 97.8 F (36.6 C) 98.2 F (36.8 C)  TempSrc: Oral Oral Oral Oral  Resp: 16 18 17 20   Height:      Weight:      SpO2: 92% 96% 98% 94%    ABD soft Sensation intact distally Intact pulses distally Dorsiflexion/Plantar flexion intact Incision: dressing C/D/I Compartment soft   Lab Results  Component Value Date   WBC 11.2* 08/06/2015   HGB 9.7* 08/06/2015   HCT 29.7* 08/06/2015   MCV 88.4 08/06/2015   PLT 217 08/06/2015   BMET    Component Value Date/Time   NA 136 08/05/2015 0401   K 3.4* 08/05/2015 0401   CL 110 08/05/2015 0401   CO2 20* 08/05/2015 0401   GLUCOSE 98 08/05/2015 0401   BUN 9 08/05/2015 0401   CREATININE 0.69 08/05/2015 0401   CALCIUM 7.5* 08/05/2015 0401   GFRNONAA >60 08/05/2015 0401   GFRAA >60 08/05/2015 0401     Assessment/Plan: 3 Days Post-Op   Principal Problem:   Left displaced femoral neck fracture (HCC) Active Problems:   Hypertension   Osteoporosis   Postoperative anemia due to acute blood loss   WBAT with walker DVT ppx: ASA 81 mg PO BID x6 weeks PT/OT PO pain control ABLA: asymptomatic, monitor Dispo: ready for d/c   Garnet KoyanagiSwinteck, Cortnee Steinmiller James 08/06/2015, 6:36 AM   Samson FredericBrian Archimedes Harold, MD Cell [phone]

## 2015-08-06 NOTE — Progress Notes (Signed)
Occupational Therapy Treatment Patient Details Name: Monica Conway MRN: [id number] DOB: 1945/07/26 Today's Date: 08/06/2015    History of present illness Pt s/p fall and sustained a L hip fracture. Pt underwent L anterior appoach THA on 08/03/15. PMH includes osteoporosis, HTN, colon sx and asthma.    OT comments  Pt continues to make progress.  Needs reinforcement about working within pain tolerance and min guard for safety as she still has unsteadiness  Follow Up Recommendations  SNF    Equipment Recommendations  3 in 1 bedside comode    Recommendations for Other Services      Precautions / Restrictions Precautions Precautions: Fall Restrictions Other Position/Activity Restrictions: WBAT       Mobility Bed Mobility         Supine to sit: Min guard     General bed mobility comments: used leg lifter as pt had difficulty sliding leg over EOB  Transfers   Equipment used: Rolling walker (2 wheeled) Transfers: Sit to/from Stand Sit to Stand: Min guard         General transfer comment: close guard for safety. VCs safety, technqiue, hand/LE placement    Balance                                   ADL       Grooming: Wash/dry hands;Min guard;Standing               Lower Body Dressing: Min guard;Sit to/from stand   Toilet Transfer: Min guard;Ambulation;BSC;RW   Toileting- ArchitectClothing Manipulation and Hygiene: Min guard;Sit to/from stand         General ADL Comments: pt with multiple questions about precautions again.  Re-educated on direct anterior THA and working within pain tolerance.  Used reacher and sock aide with min cues and min guard as pt is still unsteady when she stands.  This was the first time pt had walked to bathroom      Vision                     Perception     Praxis      Cognition   Behavior During Therapy: [facility] LPWFL for tasks assessed/performed Overall Cognitive Status: Within Functional Limits for tasks  assessed                       Extremity/Trunk Assessment               Exercises     Shoulder Instructions       General Comments      Pertinent Vitals/ Pain       Pain Assessment: 0-10 Pain Score: 4  Pain Location: L hip and thigh Pain Descriptors / Indicators: Sore Pain Intervention(s): Limited activity within patient's tolerance;Monitored during session;Premedicated before session;Repositioned  Home Living                                          Prior Functioning/Environment              Frequency       Progress Toward Goals  OT Goals(current goals can now be found in the care plan section)  Progress towards OT goals: Progressing toward goals  Acute Rehab OT Goals Patient Stated Goal: wants to return to work  and independence. ADL Goals Pt Will Perform Lower Body Bathing: with supervision;with adaptive equipment;sit to/from stand Pt Will Perform Lower Body Dressing: with supervision;with adaptive equipment;sit to/from stand Pt Will Transfer to Toilet: with supervision;ambulating;bedside commode Pt Will Perform Toileting - Clothing Manipulation and hygiene: with supervision;sit to/from stand  Plan      Co-evaluation                 End of Session     Activity Tolerance Patient tolerated treatment well   Patient Left in chair;with call bell/phone within reach;with chair alarm set   Nurse Communication          Time: [phone] OT Time Calculation (min): 37 min  Charges: OT General Charges $OT Visit: 1 Procedure OT Treatments $Self Care/Home Management : 23-37 mins  Monica Conway 08/06/2015, 9:27 AM Monica Conway, OTR/L [phone] 08/06/2015

## 2015-08-06 NOTE — Clinical Social Work Placement (Signed)
   CLINICAL SOCIAL WORK PLACEMENT  NOTE  Date:  08/06/2015  Patient Details  Name: Monica Conway MRN: [id number] Date of Birth: 05-16-1945  Clinical Social Work is seeking post-discharge placement for this patient at the Skilled  Nursing Facility level of care (*CSW will initial, date and re-position this form in  chart as items are completed):  Yes   Patient/family provided with [facility] Clinical Social Work Department's list of facilities offering this level of care within the geographic area requested by the patient (or if unable, by the patient's family).  Yes   Patient/family informed of their freedom to choose among providers that offer the needed level of care, that participate in Medicare, Medicaid or managed care program needed by the patient, have an available bed and are willing to accept the patient.  Yes   Patient/family informed of [facility]'s ownership interest [facility] and [facility], as well as of the fact that they are under no obligation to receive care at these facilities.  PASRR submitted to EDS on       PASRR number received on       Existing PASRR number confirmed on 08/06/15     FL2 transmitted to all facilities in geographic area requested by pt/family on       FL2 transmitted to all facilities within larger geographic area on       Patient informed that his/her managed care company has contracts with or will negotiate with certain facilities, including the following:        Yes   Patient/family informed of bed offers received.  [facility]     Physician recommends and [facility]    [facility].  Patient to be transferred to facility by Ambulance [facility]     Patient family notified on 08/06/15 of transfer.  Name of family member notified:  Brother: [name] called     PHYSICIAN Please prepare  prescriptions, Please sign FL2     Additional Comment:    _______________________________________________ Raye Sorrowoble, Elwyn Lowden N, LCSW 08/06/2015, 3:13 PM

## 2015-08-06 NOTE — Discharge Summary (Signed)
[facility] Discharge Summary   Patient: Monica Conway WJX:[id number]   PCP: Roxanne Mins, PA-C DOB: 25-Apr-1945   Date of admission: 08/03/2015   Date of discharge:  08/06/2015    Discharge Diagnoses:  Principal Problem:   Left displaced femoral neck fracture (HCC) Active Problems:   Hypertension   Osteoporosis   Postoperative anemia due to acute blood loss   Admitted From: home Disposition:  SNF  Recommendations for Outpatient Follow-up:  1. Please follow-up with PCP in one week. 2. This follow-up with Dr Veda Canning 2 weeks   Follow-up Information    Follow up with Swinteck, Cloyde Reams, MD. Schedule an appointment as soon as possible for a visit in 2 weeks.   Specialty:  Orthopedic Surgery   Why:  For wound re-check   Contact information:   [address] [place] [address] [phone]       Follow up with Roxanne Mins, PA-C. Schedule an appointment as soon as possible for a visit in 1 week.   Specialty:  Cardiology   Contact information:   [facility]  [address] [place] [address] [phone]      Diet recommendation: Cardiac diet  Activity: The patient is advised to gradually reintroduce usual activities.  Discharge Condition: good  Code Status: Full code  History of present illness: As per the H and P dictated on admission, "70 year old Caucasian female with history of HTN, asthma and osteoporosis. Patient had a mechanical fall at home last night (slipped and fell) with subsequent closed left hip fracture (Displaced femoral neck fracture). Patient was fairly active prior to the fall without any limitations. No chest pain, SOB or Dyspnea on exertion. Patient tells me that she had negative cardiac work up at [facility] in October of last year. No headache, no neck pain, no chest pain, no SOB, no limitation of activities, no GI symptoms and no urinary symptoms. No fever or chills."  Hospital Course:  Summary of  her active problems in the hospital is as following. 1. Left displaced femoral neck fracture (HCC) Status post left total hip arthroplasty. Pain is well-controlled. No hematoma noted. Continue incentive spirometry. Continue physical therapy. Weightbearing as tolerated with walker. Aspirin 81 mg twice a day for 6 weeks per surgery for DVT prophylaxis.  2. Postoperative blood loss anemia. Hemoglobin drops significantly postoperatively but then remained stable. We'll continue to monitor. Continue aspirin at present. Continue iron supplementation  3. Essential hypertension. Continue amlodipine, continue lisinopril.   All other chronic medical condition were stable during the hospitalization.  Patient was seen by physical therapy, who recommended SNF, which was arranged by Child psychotherapist and case Production designer, theatre/television/film. On the day of the discharge the patient's pain was well-controlled and vitals are stable, and no other acute medical condition were reported by patient. the patient was felt safe to be discharge at SNF with therapy.  Procedures and Results:  Left total hip arthroplasty   Consultations:  Orthopedics  DISCHARGE MEDICATION: Current Discharge Medication List    START taking these medications   Details  aspirin EC 81 MG EC tablet Take 1 tablet (81 mg total) by mouth 2 (two) times daily with a meal. To prevent blood clots Qty: 30 tablet, Refills: 0    docusate sodium (COLACE) 100 MG capsule Take 1 capsule (100 mg total) by mouth 2 (two) times daily. Qty: 10 capsule, Refills: 0    HYDROcodone-acetaminophen (NORCO/VICODIN) 5-325 MG tablet Take 1-2 tablets by mouth  every 6 (six) hours as needed for moderate pain. Qty: 60 tablet, Refills: 0    methocarbamol (ROBAXIN) 500 MG tablet Take 1 tablet (500 mg total) by mouth every 6 (six) hours as needed for muscle spasms. Qty: 20 tablet, Refills: 0    polyethylene glycol (MIRALAX / GLYCOLAX) packet Take 17 g by mouth daily. Qty: 14 each,  Refills: 0      CONTINUE these medications which have NOT CHANGED   Details  acetaminophen (TYLENOL) 500 MG tablet Take 500 mg by mouth 2 (two) times daily as needed for mild pain or headache.     amLODipine (NORVASC) 10 MG tablet Take 10 mg by mouth daily.    baclofen (LIORESAL) 10 MG tablet Take 10 mg by mouth as needed for muscle spasms.     Cholecalciferol 1000 UNITS capsule Take 1,000 Units by mouth daily.    ferrous sulfate 325 (65 FE) MG EC tablet Take 325 mg by mouth daily with breakfast.    lisinopril (PRINIVIL,ZESTRIL) 2.5 MG tablet Take 2.5 mg by mouth daily.    magnesium oxide (MAG-OX) 400 MG tablet Take 400 mg by mouth 2 (two) times daily.    meloxicam (MOBIC) 15 MG tablet Take 15 mg by mouth daily as needed for pain.     Multiple Vitamin (MULTIVITAMIN WITH MINERALS) TABS tablet Take 1 tablet by mouth daily.    Omega-3 Fatty Acids (OMEGA-3 FISH OIL PO) Take 15 mLs by mouth daily.    omeprazole (PRILOSEC) 20 MG capsule Take 20 mg by mouth daily as needed (acid reflux).       STOP taking these medications     alendronate (FOSAMAX) 70 MG tablet      aspirin-acetaminophen-caffeine (EXCEDRIN MIGRAINE) 250-250-65 MG tablet        Allergies  Allergen Reactions  . Pollen Extract Shortness Of Breath   Discharge Instructions    Diet - low sodium heart healthy    Complete by:  As directed      Discharge instructions    Complete by:  As directed   It is important that you read following instructions as well as go over your medication list with RN to help you understand your care after this hospitalization.  Discharge Instructions: Please follow-up with PCP in one week  Please request your primary care physician to go over all Hospital Tests and Procedure/Radiological results at the follow up,  Please get all Hospital records sent to your PCP by signing hospital release before you go home.   Do not drive, operating heavy machinery, perform activities at heights,  swimming or participation in water activities or provide baby sitting services while your are on Pain, Sleep and Anxiety Medications; until you have been seen by Primary Care Physician or a Neurologist and advised to do so again. Do not take more than prescribed Pain, Sleep and Anxiety Medications. You were cared for by a hospitalist during your hospital stay. If you have any questions about your discharge medications or the care you received while you were in the hospital after you are discharged, you can call the unit and ask to speak with the hospitalist on call if the hospitalist that took care of you is not available.  Once you are discharged, your primary care physician will handle any further medical issues. Please note that NO REFILLS for any discharge medications will be authorized once you are discharged, as it is imperative that you return to your primary care physician (or establish a relationship with  a primary care physician if you do not have one) for your aftercare needs so that they can reassess your need for medications and monitor your lab values. You Must read complete instructions/literature along with all the possible adverse reactions/side effects for all the Medicines you take and that have been prescribed to you. Take any new Medicines after you have completely understood and accept all the possible adverse reactions/side effects. Wear Seat belts while driving. If you have smoked or chewed Tobacco in the last 2 yrs please stop smoking and/or stop any Recreational drug use.     Increase activity slowly    Complete by:  As directed      Weight bearing as tolerated    Complete by:  As directed   With use of assistive devices          Discharge Exam: Filed Weights   08/03/15 1000  Weight: 61.236 kg (135 lb)   Filed Vitals:   08/05/15 2040 08/06/15 0422  BP: 140/73 137/70  Pulse: 93 96  Temp: 97.8 F (36.6 C) 98.2 F (36.8 C)  Resp: 17 20   General: Appear in no  distress, no Rash; Oral Mucosa moist Cardiovascular: S1 and S2 Present, no Murmur, no JVD Respiratory: Bilateral Air entry present and Clear to Auscultation, no Crackles, no wheezes Abdomen: Bowel Sound present, Soft and no tenderness Extremities: no Pedal edema, no calf tenderness Neurology: Grossly no focal neuro deficit.  The results of significant diagnostics from this hospitalization (including imaging, microbiology, ancillary and laboratory) are listed below for reference.    Significant Diagnostic Studies: Dg Chest 2 View  08/03/2015  CLINICAL DATA:  Fall onto left hip.  Hip fracture.  Preop. EXAM: CHEST  2 VIEW COMPARISON:  02/04/2014 FINDINGS: Chronic widening of the upper mediastinum which is likely from ectatic vessels. The trachea is not deviated or narrowed. Heart size within normal limits. Stable aortic tortuosity. Chronic borderline hyperinflation. There is no edema, consolidation, effusion, or pneumothorax. IMPRESSION: No evidence of active disease. Electronically Signed   By: Marnee SpringJonathon  Watts M.D.   On: 08/03/2015 05:24   Ct Knee Left Wo Contrast  08/03/2015  CLINICAL DATA:  Recent slip and fall with left knee pain and questionable tibial plateau fracture on recent plain film. EXAM: CT OF THE left KNEE WITHOUT CONTRAST TECHNIQUE: Multidetector CT imaging of the left knee was performed according to the standard protocol. Multiplanar CT image reconstructions were also generated. COMPARISON:  Plain film from earlier in the same day. FINDINGS: A small suprapatellar effusion is noted. The suprapatellar and infrapatellar ligaments are intact. Degenerative changes are noted in all 3 joint compartments but worst in the medial joint compartment. No tibial plateau fracture or femoral condyle fracture is noted. The menisci and cruciate ligaments are not well appreciated on this exam. The lateral collateral and medial collateral ligaments appear intact. No other focal abnormality is seen. IMPRESSION:  No acute fracture noted. Degenerative changes and small joint effusion. Electronically Signed   By: Alcide CleverMark  Lukens M.D.   On: 08/03/2015 17:31   Pelvis Portable  08/03/2015  CLINICAL DATA:  Postop for left hip replacement. EXAM: PORTABLE PELVIS 1-2 VIEWS COMPARISON:  Intraoperative imaging of same date. FINDINGS: Left hip arthroplasty. Expected subcutaneous gas about the joint. No periprosthetic fracture. Right femoral head located. IMPRESSION: Expected appearance after left hip arthroplasty. Electronically Signed   By: Jeronimo GreavesKyle  Talbot M.D.   On: 08/03/2015 21:11   Dg Knee Left Port  08/03/2015  CLINICAL DATA:  Status  post fall with left hip fracture and pain within the left leg. EXAM: PORTABLE LEFT KNEE - 1-2 VIEW COMPARISON:  None. FINDINGS: There is no evidence of displaced fracture. There is flattening of the medial tibial plateau with associated curvilinear subchondral lucency, with uncertain significance. 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 There is a small suprapatellar joint effusion. IMPRESSION: Flattening of the medial tibial plateau with associated subchondral curvilinear lucency. This may represent osteoarthritic changes, however non displaced medial tibial plateau fracture cannot be entirely excluded. Please correlate to patient's clinical exam. Small suprapatellar joint effusion. Electronically Signed   By: Ted Mcalpine M.D.   On: 08/03/2015 08:21   Dg C-arm 61-120 Min-no Report  08/03/2015  CLINICAL DATA: surgery C-ARM 61-120 MINUTES Fluoroscopy was utilized by the requesting physician.  No radiographic interpretation.   Dg Hip Operative Unilat With Pelvis Left  08/03/2015  CLINICAL DATA:  Left hip arthroplasty. EXAM: DG C-ARM 61-120 MIN-NO REPORT; OPERATIVE LEFT HIP WITH PELVIS COMPARISON:  08/03/2015 FINDINGS: 2 intraoperative views demonstrating left hip arthroplasty. No acute hardware complication or periprosthetic fracture. IMPRESSION: Intraoperative imaging of left hip  arthroplasty. Electronically Signed   By: Jeronimo Greaves M.D.   On: 08/03/2015 20:41   Dg Hip Unilat With Pelvis 2-3 Views Left  08/03/2015  CLINICAL DATA:  Fall at home with left hip pain.  Initial encounter. EXAM: DG HIP (WITH OR WITHOUT PELVIS) 2-3V LEFT COMPARISON:  None. FINDINGS: Displaced left femoral neck fracture. No notable degenerative changes to the hip. No evidence of pelvic ring fracture or diastasis. Lower lumbar disc degeneration with narrowing and spurring. Postoperative bowel in the right lower quadrant. IMPRESSION: Displaced left femoral neck fracture. Electronically Signed   By: Marnee Spring M.D.   On: 08/03/2015 05:22    Microbiology: Recent Results (from the past 240 hour(s))  MRSA PCR Screening     Status: None   Collection Time: 08/03/15 10:48 AM  Result Value Ref Range Status   MRSA by PCR NEGATIVE NEGATIVE Final    Comment:        The GeneXpert MRSA Assay (FDA approved for NASAL specimens only), is one component of a comprehensive MRSA colonization surveillance program. It is not intended to diagnose MRSA infection nor to guide or monitor treatment for MRSA infections.      Labs: CBC:  Recent Labs Lab 08/03/15 0625 08/04/15 0320 08/05/15 0401 08/06/15 0333  WBC 11.8* 11.6* 9.9 11.2*  NEUTROABS 9.8*  --   --   --   HGB 12.5 9.2* 9.1* 9.7*  HCT 38.0 28.7* 28.4* 29.7*  MCV 88.4 86.4 88.5 88.4  PLT 314 229 194 217   Basic Metabolic Panel:  Recent Labs Lab 08/03/15 0625 08/04/15 0320 08/05/15 0401  NA 134* 136 136  K 3.7 3.9 3.4*  CL 105 109 110  CO2 17* 20* 20*  GLUCOSE 115* 97 98  BUN 21* 15 9  CREATININE 1.07* 0.78 0.69  CALCIUM 8.7* 7.3* 7.5*   Time spent: 30 minutes  Signed:  [name] Meter  [facility]  08/06/2015  , 12:59 PM

## 2015-08-06 NOTE — Progress Notes (Signed)
Physical Therapy Treatment Patient Details Name: Monica LuoMary L Conway MRN: [id number] DOB: 1945/12/05 Today's Date: 08/06/2015    History of Present Illness Pt s/p fall and sustained a L hip fracture. Pt underwent L anterior appoach THA on 08/03/15. PMH includes osteoporosis, HTN, colon sx and asthma.     PT Comments    Progressing slowly with mobility. Continue to recommend ST rehab at SNF  Follow Up Recommendations  SNF     Equipment Recommendations  Rolling walker with 5" wheels    Recommendations for Other Services       Precautions / Restrictions Precautions Precautions: Fall Restrictions Weight Bearing Restrictions: No LLE Weight Bearing: Weight bearing as tolerated    Mobility  Bed Mobility              General bed mobility comments: oob in recliner  Transfers Overall transfer level: Needs assistance Equipment used: Rolling walker (2 wheeled) Transfers: Sit to/from Stand Sit to Stand: Min guard         General transfer comment: close guard for safety. VCs safety, technqiue, hand/LE placement  Ambulation/Gait Ambulation/Gait assistance: Min assist Ambulation Distance (Feet): 27 Feet Assistive device: Rolling walker (2 wheeled) Gait Pattern/deviations: Step-to pattern;Decreased stride length;Antalgic     General Gait Details: VCs safety, technique, sequence. Slow gait speed. Pt fatigues and gets anxious fairly easily. Pt tends to self limit her distance.   Stairs            Wheelchair Mobility    Modified Rankin (Stroke Patients Only)       Balance                                    Cognition Arousal/Alertness: Awake/alert Behavior During Therapy: WFL for tasks assessed/performed Overall Cognitive Status: Within Functional Limits for tasks assessed                      Exercises Total Joint Exercises Ankle Circles/Pumps: AROM;Both;10 reps;Supine Quad Sets: AROM;Both;10 reps;Supine Heel Slides: AAROM;Left;10  reps;Supine Hip ABduction/ADduction: AAROM;Left;10 reps;Supine    General Comments        Pertinent Vitals/Pain Pain Assessment: 0-10 Pain Score: 7  Pain Location: L hip and thigh Pain Descriptors / Indicators: Sore Pain Intervention(s): Monitored during session;Limited activity within patient's tolerance;Repositioned;Ice applied    Home Living                      Prior Function            PT Goals (current goals can now be found in the care plan section) Acute Rehab PT Goals Patient Stated Goal: wants to return to work and independence. Progress towards PT goals: Progressing toward goals    Frequency  Min 4X/week    PT Plan Current plan remains appropriate    Co-evaluation             End of Session   Activity Tolerance: Patient limited by fatigue;Patient limited by pain Patient left: in chair;with call bell/phone within reach;with family/visitor present     Time: 1030-1044 PT Time Calculation (min) (ACUTE ONLY): 14 min  Charges:  $Gait Training: 8-22 mins                    G Codes:      Monica Conway, MPT Pager: [phone]

## 2015-08-06 NOTE — Progress Notes (Addendum)
LCSW actively working on Counselling psychologistobtaining insurance authorization for SNF. Patient per weekend notes wants to [facility] and bed is available, however need insurance authorization which is pending.  Will follow up with patient facility once insurance is obtained. If patient is discharged today, will need other option for SNF as insurance most likely will take 24-48 hours.  3:00 PM: Patient has made accepted to [facility] on a LOG (5 days, approved by Wandra MannanZack Brooks) Patient is agreeable and understands that this is because insurance authorization has not been obtained and she has orders to discharge. Patient's brother contacted and will be signing  patient into facility.  Facility aware and agreeable. All information have been sent to facility. Patient will transport by EMS. LCSW will arrange. NO other needs, DC to SNF>   Monica EmoryHannah Jacere Pangborn LCSW, MSW Clinical Social Work: [facility] [employer] [phone]

## 2015-11-22 ENCOUNTER — Other Ambulatory Visit: Payer: Self-pay | Admitting: Family Medicine

## 2015-11-22 ENCOUNTER — Other Ambulatory Visit: Payer: Self-pay | Admitting: Obstetrics and Gynecology

## 2015-11-22 DIAGNOSIS — Z1231 Encounter for screening mammogram for malignant neoplasm of breast: Secondary | ICD-10-CM

## 2015-11-30 ENCOUNTER — Other Ambulatory Visit: Payer: Self-pay | Admitting: Obstetrics & Gynecology

## 2015-12-03 LAB — CYTOLOGY - PAP

## 2015-12-04 ENCOUNTER — Ambulatory Visit: Payer: Medicare HMO

## 2015-12-13 ENCOUNTER — Ambulatory Visit: Payer: Medicare HMO

## 2015-12-13 ENCOUNTER — Ambulatory Visit
Admission: RE | Admit: 2015-12-13 | Discharge: 2015-12-13 | Disposition: A | Discharge status: Home / Self Care | Payer: Medicare HMO | Source: Ambulatory Visit | Attending: Obstetrics and Gynecology | Admitting: Obstetrics and Gynecology

## 2015-12-13 DIAGNOSIS — Z1231 Encounter for screening mammogram for malignant neoplasm of breast: Secondary | ICD-10-CM

## 2016-10-31 ENCOUNTER — Other Ambulatory Visit: Payer: Self-pay | Admitting: Obstetrics and Gynecology

## 2016-10-31 DIAGNOSIS — Z1231 Encounter for screening mammogram for malignant neoplasm of breast: Secondary | ICD-10-CM

## 2016-12-18 ENCOUNTER — Ambulatory Visit
Admission: RE | Admit: 2016-12-18 | Discharge: 2016-12-18 | Disposition: A | Discharge status: Home / Self Care | Payer: Medicare HMO | Source: Ambulatory Visit | Attending: Obstetrics and Gynecology | Admitting: Obstetrics and Gynecology

## 2016-12-18 DIAGNOSIS — Z1231 Encounter for screening mammogram for malignant neoplasm of breast: Secondary | ICD-10-CM

## 2017-10-16 IMAGING — RF DG HIP (WITH PELVIS) OPERATIVE*L*
1 series · 2 of 2 positions shown · non-contrast
Comparison: 08/03/2015

CLINICAL DATA: Left hip arthroplasty.

EXAM:
DG C-ARM 61-120 MIN-NO REPORT; OPERATIVE LEFT HIP WITH PELVIS

[Series 1: run · 2 of 2 slices shown]
[im 1/2]
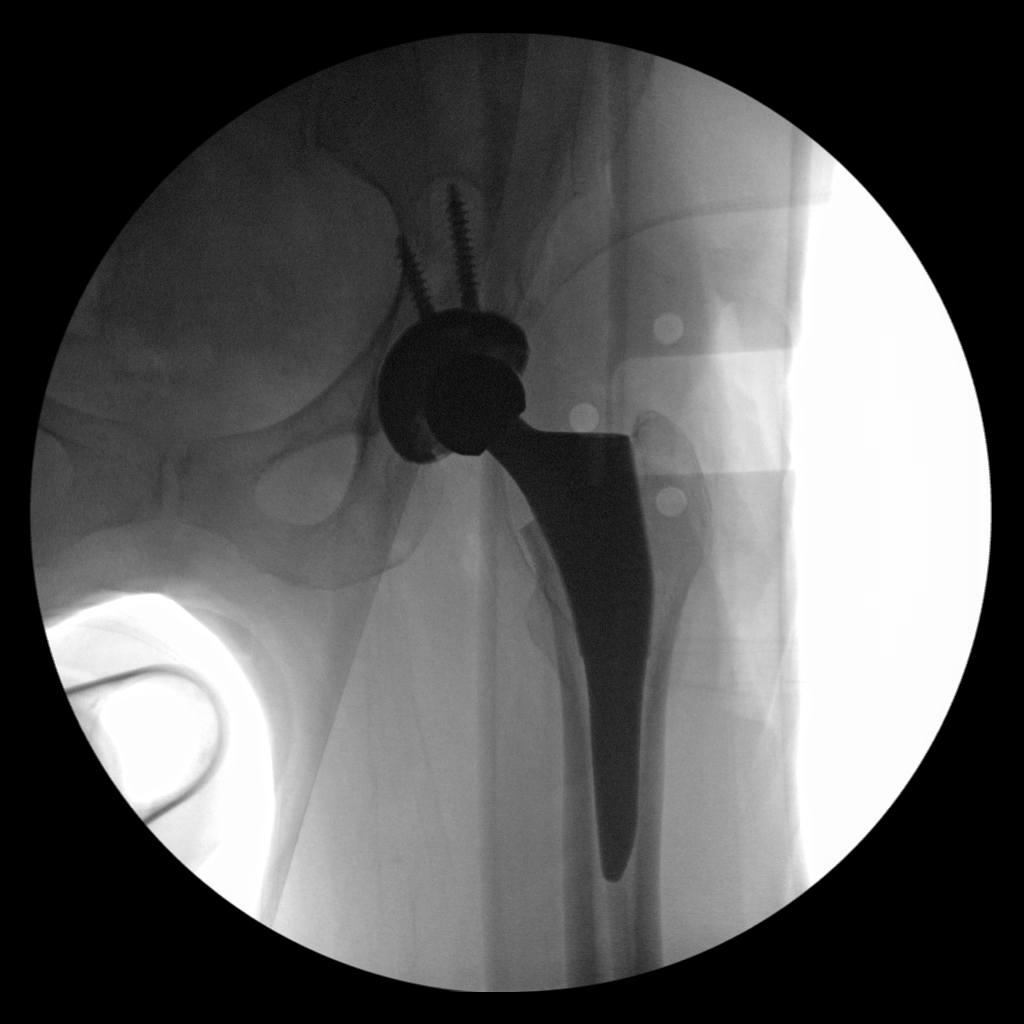
[im 2/2]
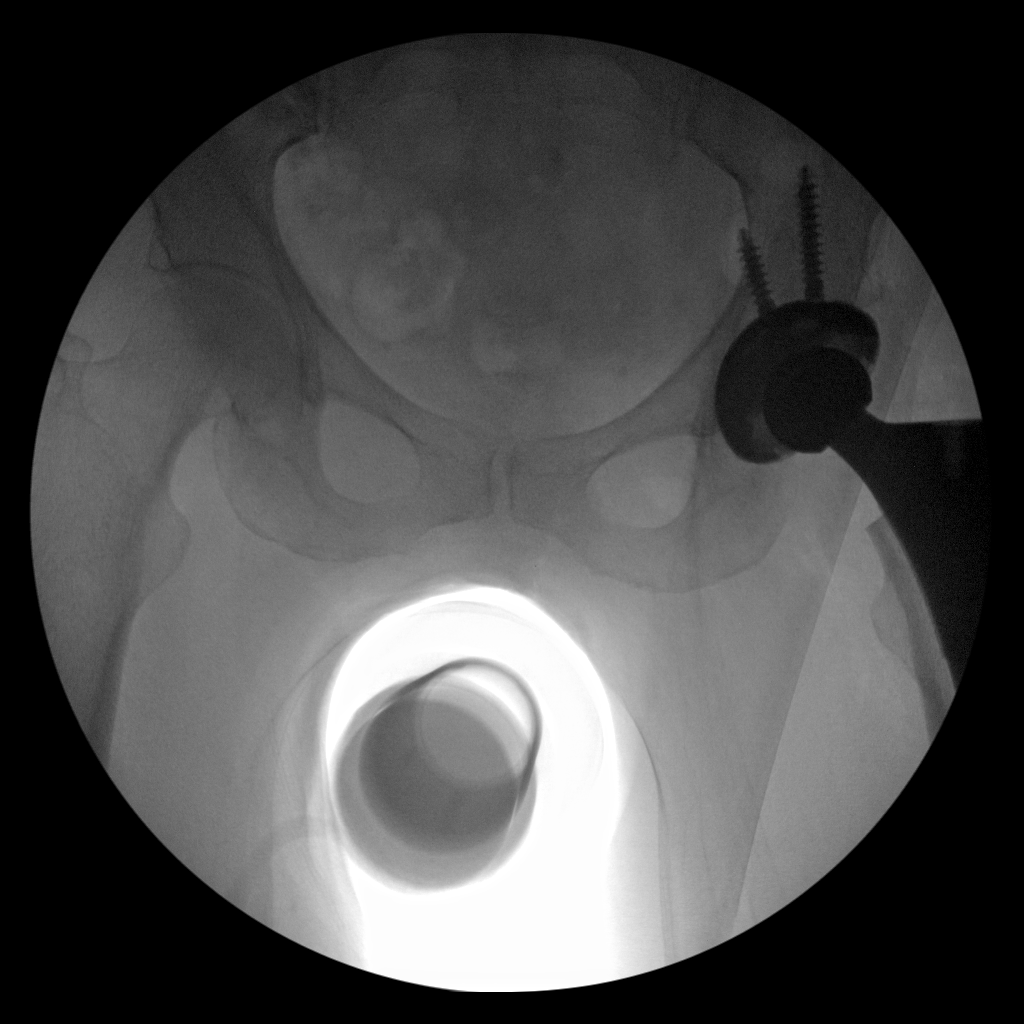

[2 of 2 positions shown; findings below may reference images not displayed]

FINDINGS: 2 intraoperative views demonstrating left hip arthroplasty. No acute
hardware complication or periprosthetic fracture.
IMPRESSION: Intraoperative imaging of left hip arthroplasty.

## 2017-10-16 IMAGING — DX DG KNEE 1-2V PORT*L*
2 series · 2 of 2 positions shown · non-contrast
Comparison: None.

CLINICAL DATA: Status post fall with left hip fracture and pain
within the left leg.

EXAM:
PORTABLE LEFT KNEE - 1-2 VIEW

[knee ap]
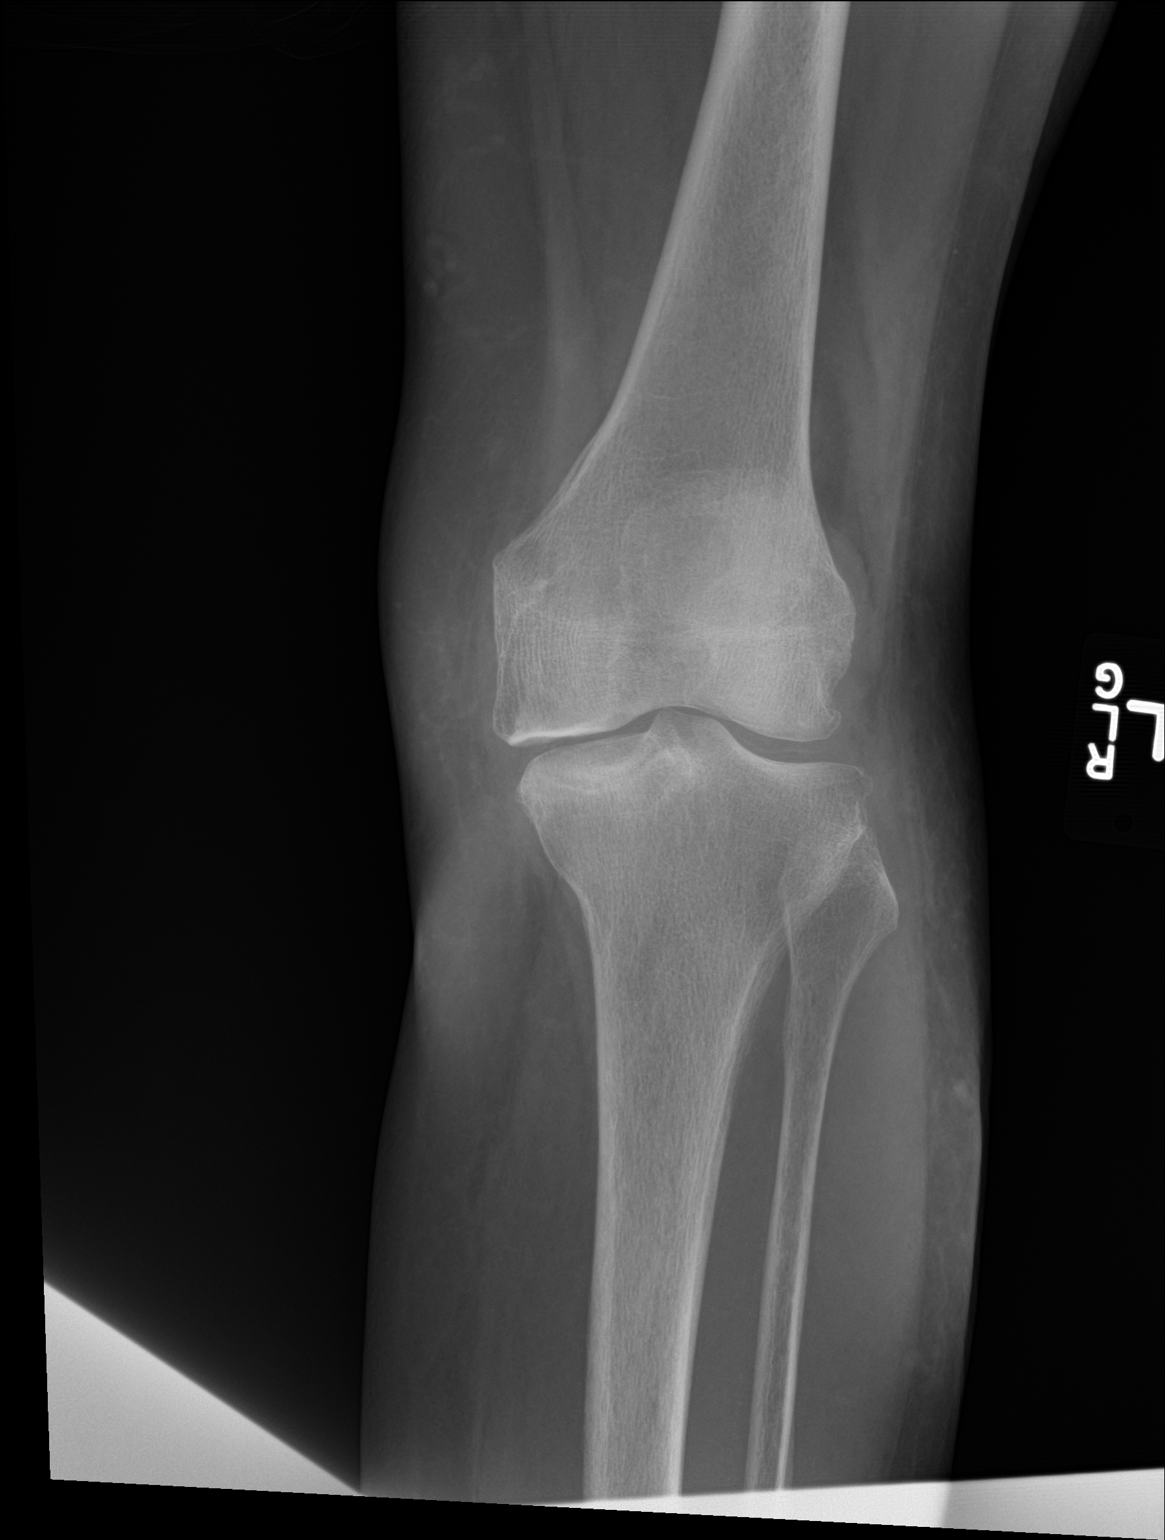

[knee lat]
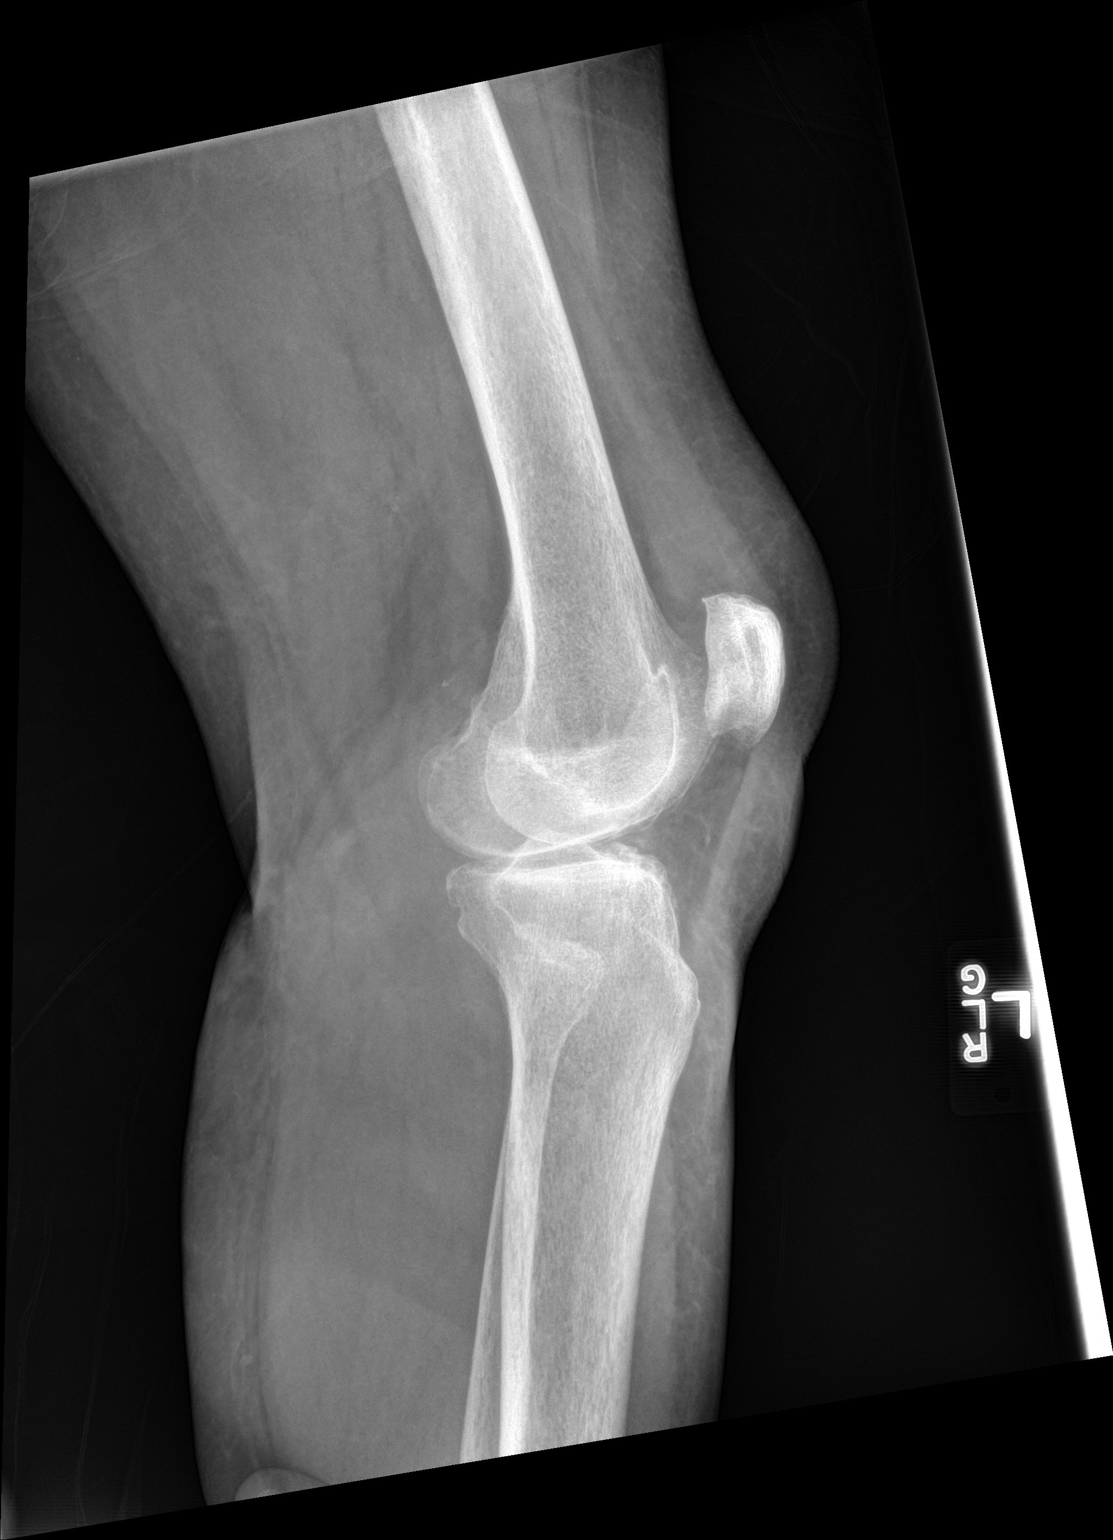

[2 of 2 positions shown; findings below may reference images not displayed]

FINDINGS: There is no evidence of displaced fracture. There is flattening of
the medial tibial plateau with associated curvilinear subchondral
lucency, with uncertain significance. Mild 3 compartment
osteoarthritic changes are seen. There is a small suprapatellar
joint effusion.
IMPRESSION: Flattening of the medial tibial plateau with associated subchondral
curvilinear lucency. This may represent osteoarthritic changes,
however non displaced medial tibial plateau fracture cannot be
entirely excluded. Please correlate to patient's clinical exam.

Small suprapatellar joint effusion.

## 2017-11-11 ENCOUNTER — Other Ambulatory Visit: Payer: Self-pay | Admitting: Obstetrics and Gynecology

## 2017-11-11 DIAGNOSIS — Z1231 Encounter for screening mammogram for malignant neoplasm of breast: Secondary | ICD-10-CM

## 2017-12-23 ENCOUNTER — Ambulatory Visit: Payer: Medicare HMO

## 2017-12-23 ENCOUNTER — Ambulatory Visit
Admission: RE | Admit: 2017-12-23 | Discharge: 2017-12-23 | Disposition: A | Discharge status: Home / Self Care | Payer: Medicare HMO | Source: Ambulatory Visit | Attending: Obstetrics and Gynecology | Admitting: Obstetrics and Gynecology

## 2017-12-23 DIAGNOSIS — Z1231 Encounter for screening mammogram for malignant neoplasm of breast: Secondary | ICD-10-CM

## 2018-11-24 ENCOUNTER — Other Ambulatory Visit: Payer: Self-pay | Admitting: Cardiology

## 2018-11-24 DIAGNOSIS — Z1231 Encounter for screening mammogram for malignant neoplasm of breast: Secondary | ICD-10-CM

## 2019-01-12 ENCOUNTER — Ambulatory Visit
Admission: RE | Admit: 2019-01-12 | Discharge: 2019-01-12 | Disposition: A | Discharge status: Home / Self Care | Payer: Medicare HMO | Source: Ambulatory Visit | Attending: Cardiology | Admitting: Cardiology

## 2019-01-12 ENCOUNTER — Other Ambulatory Visit: Payer: Self-pay

## 2019-01-12 DIAGNOSIS — Z1231 Encounter for screening mammogram for malignant neoplasm of breast: Secondary | ICD-10-CM

## 2019-12-05 ENCOUNTER — Other Ambulatory Visit: Payer: Self-pay | Admitting: Cardiology

## 2019-12-05 DIAGNOSIS — Z1231 Encounter for screening mammogram for malignant neoplasm of breast: Secondary | ICD-10-CM

## 2020-01-13 ENCOUNTER — Other Ambulatory Visit: Payer: Self-pay

## 2020-01-13 ENCOUNTER — Ambulatory Visit
Admission: RE | Admit: 2020-01-13 | Discharge: 2020-01-13 | Disposition: A | Discharge status: Home / Self Care | Payer: Medicare HMO | Source: Ambulatory Visit | Attending: Cardiology | Admitting: Cardiology

## 2020-01-13 DIAGNOSIS — Z1231 Encounter for screening mammogram for malignant neoplasm of breast: Secondary | ICD-10-CM

## 2020-11-28 ENCOUNTER — Other Ambulatory Visit: Payer: Self-pay | Admitting: Cardiology

## 2020-11-28 DIAGNOSIS — Z1231 Encounter for screening mammogram for malignant neoplasm of breast: Secondary | ICD-10-CM

## 2021-01-23 ENCOUNTER — Ambulatory Visit: Payer: Medicare HMO

## 2021-04-04 ENCOUNTER — Ambulatory Visit
Admission: RE | Admit: 2021-04-04 | Discharge: 2021-04-04 | Disposition: A | Discharge status: Home / Self Care | Payer: Medicare HMO | Source: Ambulatory Visit | Attending: Cardiology | Admitting: Cardiology

## 2021-04-04 ENCOUNTER — Other Ambulatory Visit: Payer: Self-pay

## 2021-04-04 DIAGNOSIS — Z1231 Encounter for screening mammogram for malignant neoplasm of breast: Secondary | ICD-10-CM

## 2022-03-28 ENCOUNTER — Other Ambulatory Visit: Payer: Self-pay | Admitting: Cardiology

## 2022-03-28 DIAGNOSIS — Z1231 Encounter for screening mammogram for malignant neoplasm of breast: Secondary | ICD-10-CM

## 2022-03-28 IMAGING — MG DIGITAL SCREENING BILAT W/ TOMO W/ CAD
8 series · 9 of 24 positions shown · non-contrast
Comparison: Previous exam(s).

CLINICAL DATA: Screening.

EXAM:
DIGITAL SCREENING BILATERAL MAMMOGRAM WITH TOMO AND CAD

[R CC synth-2D]
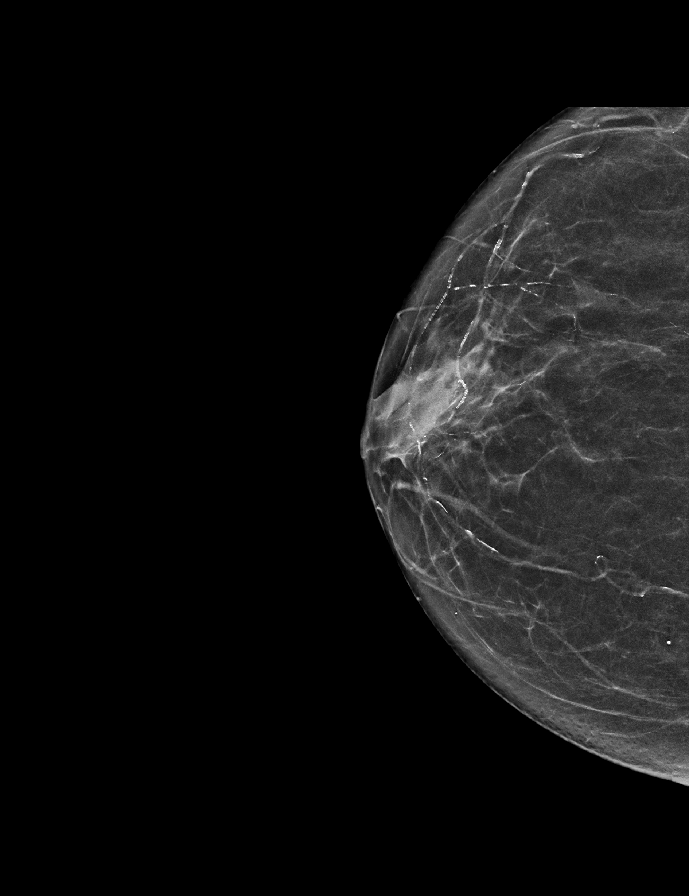

[R MLO synth-2D]
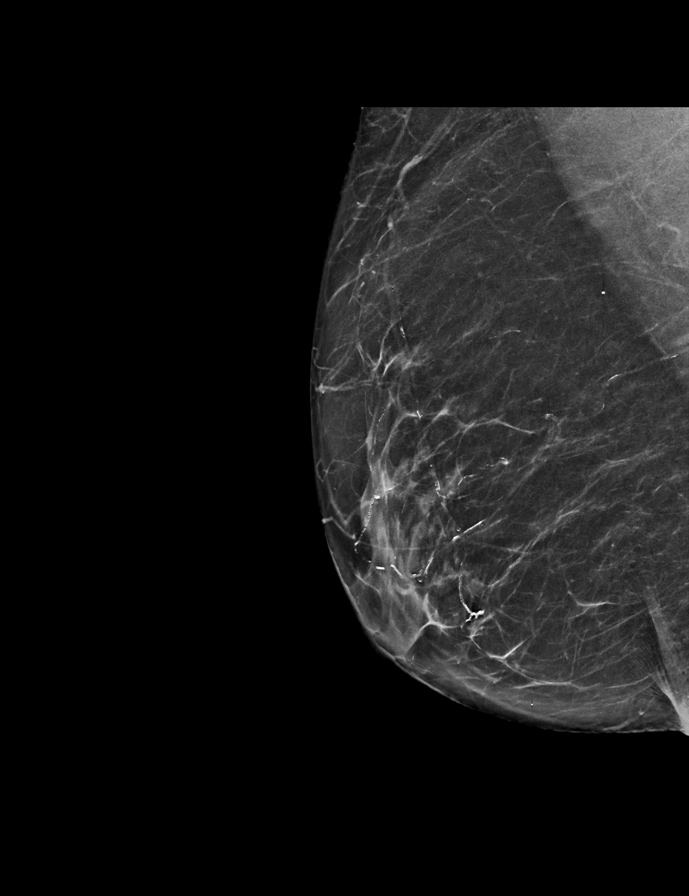

[L MLO synth-2D]
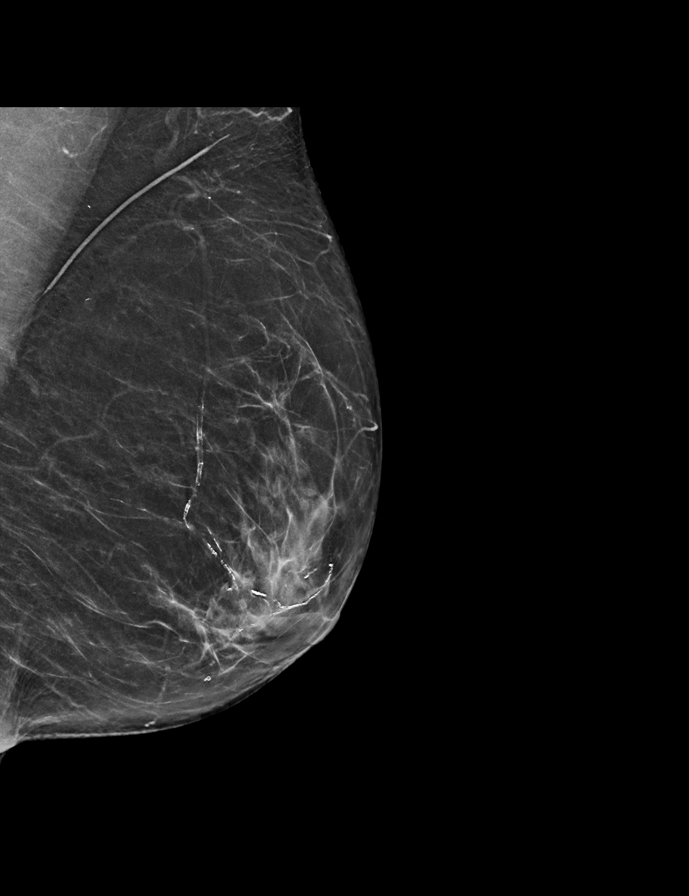

[L CC synth-2D]
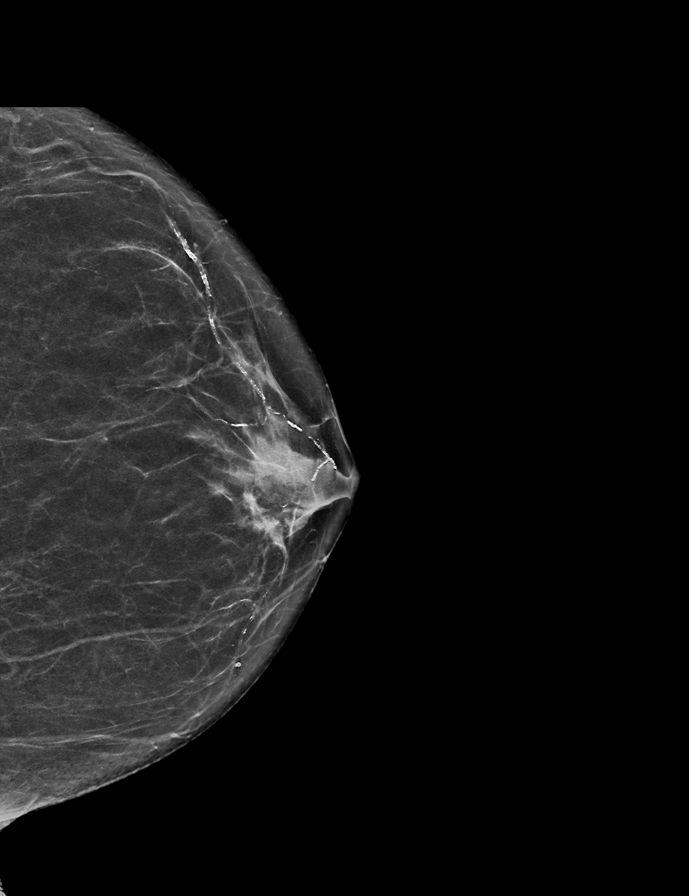

[R MLO tomo · 2 of 65 frames shown]
[frame 21/65]
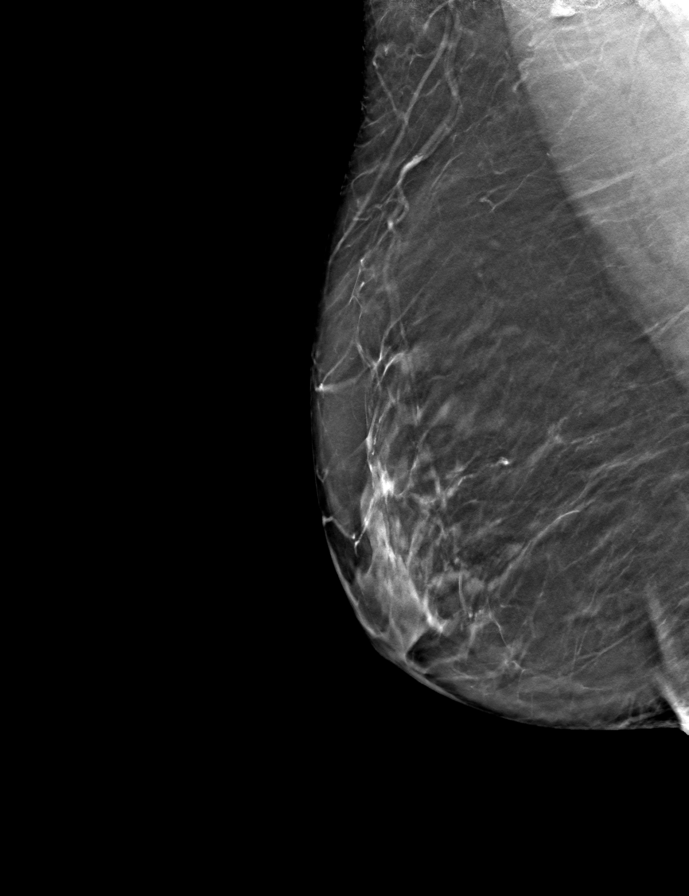
[frame 33/65]
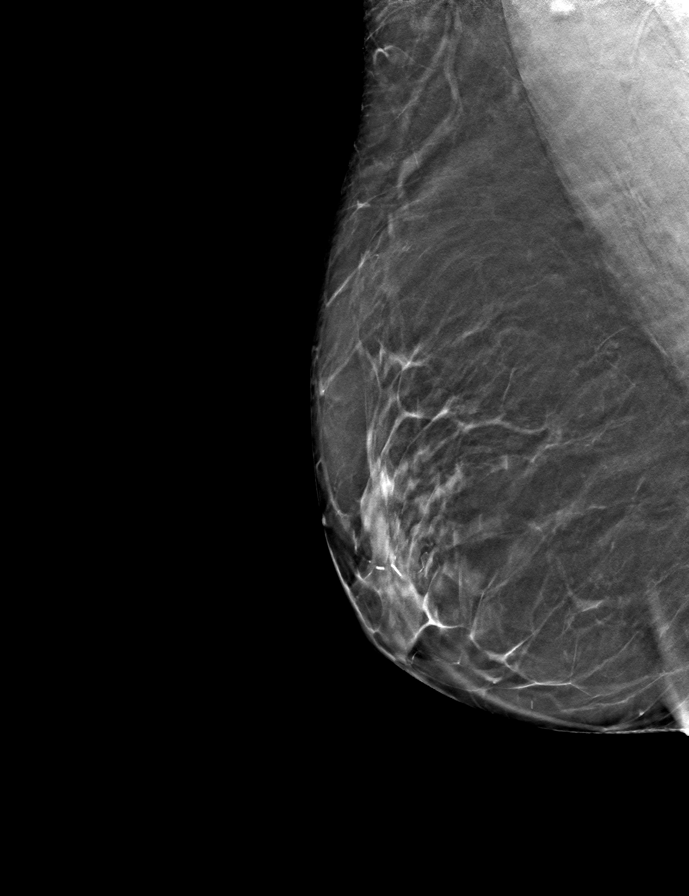

[L CC tomo · tomo slice 30/59.0]
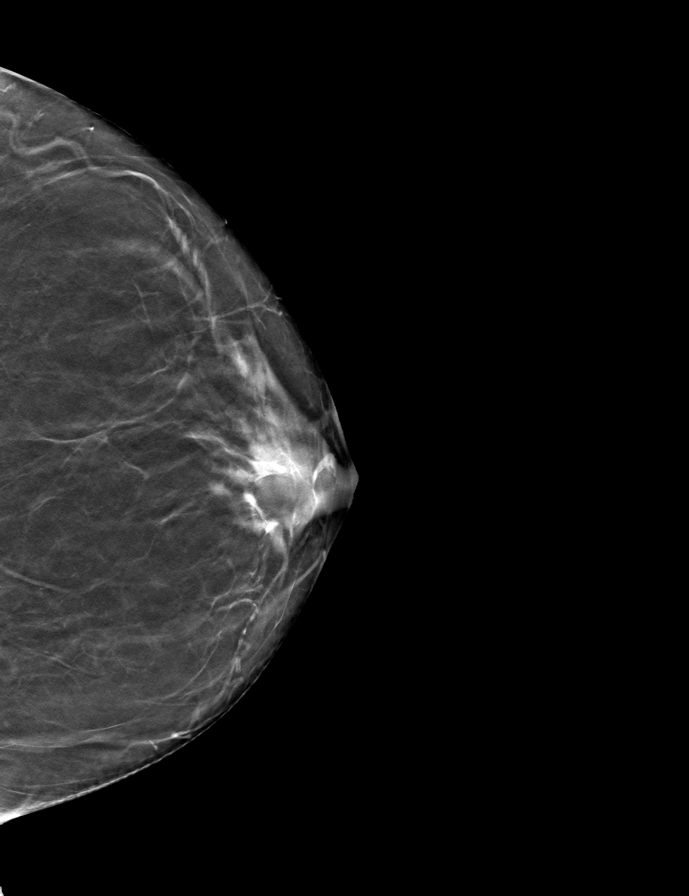

[R CC tomo · tomo slice 30/59.0]
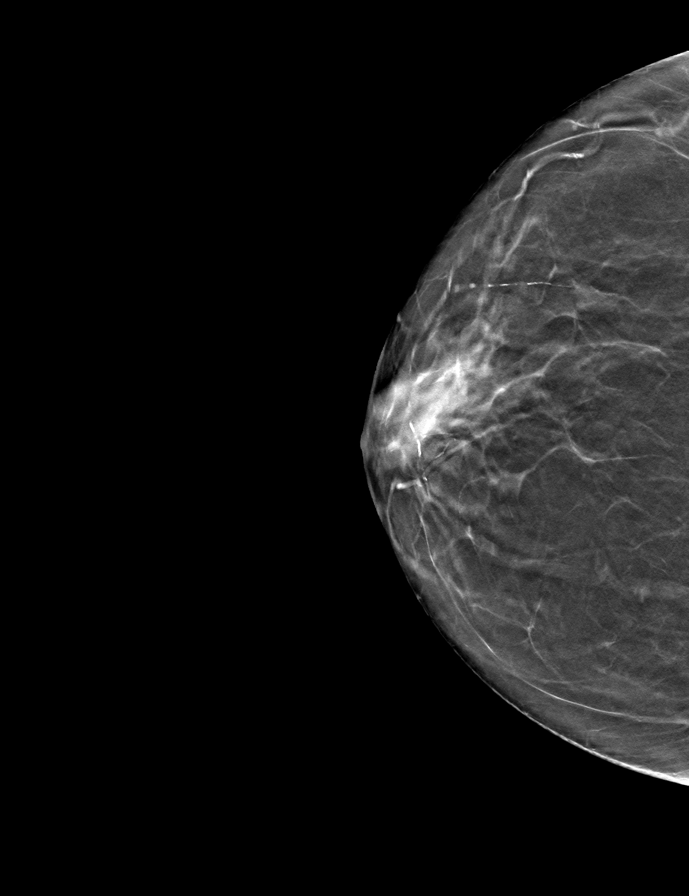

[L MLO tomo · tomo slice 33/65.0]
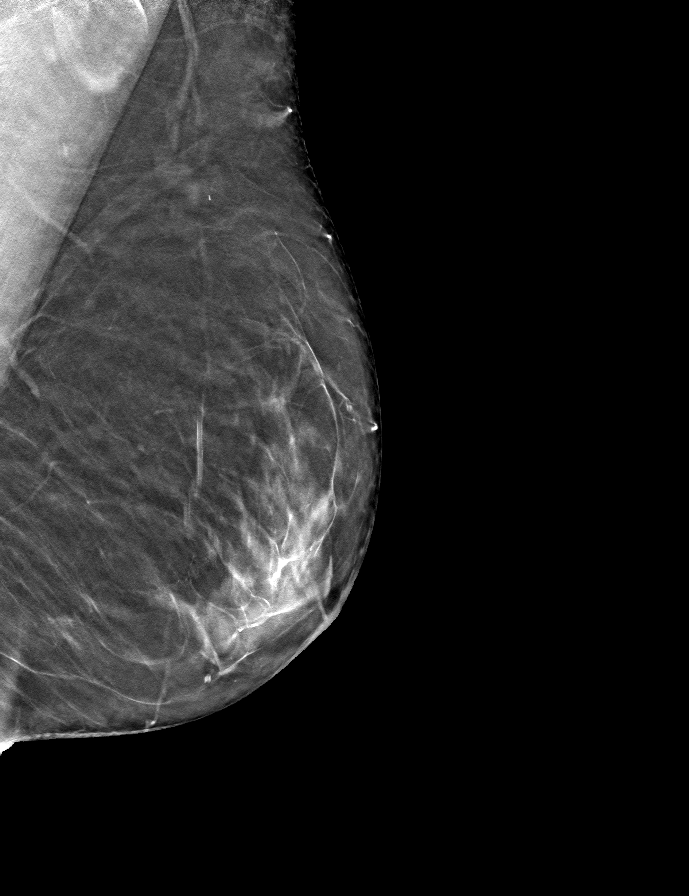

[9 of 24 positions shown; findings below may reference images not displayed]

ACR Breast Density Category b: There are scattered areas of
fibroglandular density.
FINDINGS: There are no findings suspicious for malignancy. Images were
processed with CAD.
IMPRESSION: No mammographic evidence of malignancy. A result letter of this
screening mammogram will be mailed directly to the patient.

RECOMMENDATION:
Screening mammogram in one year. (Code:CN-U-775)

BI-RADS CATEGORY  1: Negative.

## 2022-05-28 ENCOUNTER — Ambulatory Visit: Payer: Medicare HMO

## 2022-06-04 ENCOUNTER — Ambulatory Visit
Admission: RE | Admit: 2022-06-04 | Discharge: 2022-06-04 | Disposition: A | Discharge status: Home / Self Care | Payer: Medicare HMO | Source: Ambulatory Visit | Attending: Cardiology | Admitting: Cardiology

## 2022-06-04 DIAGNOSIS — Z1231 Encounter for screening mammogram for malignant neoplasm of breast: Secondary | ICD-10-CM

## 2023-01-24 ENCOUNTER — Inpatient Hospital Stay (HOSPITAL_COMMUNITY)
Admission: EM | Admit: 2023-01-24 | Discharge: 2023-01-31 | DRG: 064 | Disposition: A | Discharge status: Skilled Nursing Facility | Payer: Medicare HMO | Attending: Internal Medicine | Admitting: Internal Medicine

## 2023-01-24 ENCOUNTER — Other Ambulatory Visit: Payer: Self-pay

## 2023-01-24 ENCOUNTER — Observation Stay (HOSPITAL_COMMUNITY): Payer: Medicare HMO

## 2023-01-24 ENCOUNTER — Encounter (HOSPITAL_COMMUNITY): Payer: Self-pay

## 2023-01-24 ENCOUNTER — Emergency Department (HOSPITAL_COMMUNITY): Payer: Medicare HMO

## 2023-01-24 ENCOUNTER — Inpatient Hospital Stay (HOSPITAL_COMMUNITY): Payer: Medicare HMO

## 2023-01-24 DIAGNOSIS — R569 Unspecified convulsions: Secondary | ICD-10-CM | POA: Diagnosis not present

## 2023-01-24 DIAGNOSIS — Z8249 Family history of ischemic heart disease and other diseases of the circulatory system: Secondary | ICD-10-CM

## 2023-01-24 DIAGNOSIS — E860 Dehydration: Secondary | ICD-10-CM | POA: Diagnosis present

## 2023-01-24 DIAGNOSIS — E876 Hypokalemia: Secondary | ICD-10-CM | POA: Diagnosis not present

## 2023-01-24 DIAGNOSIS — Z66 Do not resuscitate: Secondary | ICD-10-CM | POA: Diagnosis present

## 2023-01-24 DIAGNOSIS — Z1152 Encounter for screening for COVID-19: Secondary | ICD-10-CM | POA: Diagnosis not present

## 2023-01-24 DIAGNOSIS — Z96642 Presence of left artificial hip joint: Secondary | ICD-10-CM | POA: Diagnosis present

## 2023-01-24 DIAGNOSIS — J45909 Unspecified asthma, uncomplicated: Secondary | ICD-10-CM | POA: Diagnosis present

## 2023-01-24 DIAGNOSIS — Z79899 Other long term (current) drug therapy: Secondary | ICD-10-CM

## 2023-01-24 DIAGNOSIS — I639 Cerebral infarction, unspecified: Secondary | ICD-10-CM

## 2023-01-24 DIAGNOSIS — I6381 Other cerebral infarction due to occlusion or stenosis of small artery: Secondary | ICD-10-CM

## 2023-01-24 DIAGNOSIS — R197 Diarrhea, unspecified: Secondary | ICD-10-CM | POA: Diagnosis present

## 2023-01-24 DIAGNOSIS — I480 Paroxysmal atrial fibrillation: Secondary | ICD-10-CM

## 2023-01-24 DIAGNOSIS — D649 Anemia, unspecified: Secondary | ICD-10-CM | POA: Diagnosis present

## 2023-01-24 DIAGNOSIS — F05 Delirium due to known physiological condition: Secondary | ICD-10-CM | POA: Diagnosis not present

## 2023-01-24 DIAGNOSIS — I10 Essential (primary) hypertension: Secondary | ICD-10-CM | POA: Diagnosis present

## 2023-01-24 DIAGNOSIS — R1310 Dysphagia, unspecified: Secondary | ICD-10-CM | POA: Diagnosis present

## 2023-01-24 DIAGNOSIS — M810 Age-related osteoporosis without current pathological fracture: Secondary | ICD-10-CM | POA: Diagnosis present

## 2023-01-24 DIAGNOSIS — A419 Sepsis, unspecified organism: Secondary | ICD-10-CM

## 2023-01-24 DIAGNOSIS — E785 Hyperlipidemia, unspecified: Secondary | ICD-10-CM | POA: Diagnosis present

## 2023-01-24 DIAGNOSIS — G9340 Encephalopathy, unspecified: Secondary | ICD-10-CM | POA: Diagnosis present

## 2023-01-24 DIAGNOSIS — Z7901 Long term (current) use of anticoagulants: Secondary | ICD-10-CM

## 2023-01-24 DIAGNOSIS — R6511 Systemic inflammatory response syndrome (SIRS) of non-infectious origin with acute organ dysfunction: Secondary | ICD-10-CM | POA: Diagnosis present

## 2023-01-24 DIAGNOSIS — R410 Disorientation, unspecified: Secondary | ICD-10-CM | POA: Diagnosis present

## 2023-01-24 DIAGNOSIS — I4891 Unspecified atrial fibrillation: Secondary | ICD-10-CM | POA: Diagnosis not present

## 2023-01-24 DIAGNOSIS — R748 Abnormal levels of other serum enzymes: Secondary | ICD-10-CM | POA: Diagnosis present

## 2023-01-24 DIAGNOSIS — D72829 Elevated white blood cell count, unspecified: Secondary | ICD-10-CM | POA: Diagnosis present

## 2023-01-24 DIAGNOSIS — R911 Solitary pulmonary nodule: Secondary | ICD-10-CM | POA: Diagnosis present

## 2023-01-24 DIAGNOSIS — Z9109 Other allergy status, other than to drugs and biological substances: Secondary | ICD-10-CM

## 2023-01-24 DIAGNOSIS — R739 Hyperglycemia, unspecified: Secondary | ICD-10-CM | POA: Diagnosis present

## 2023-01-24 DIAGNOSIS — R4189 Other symptoms and signs involving cognitive functions and awareness: Secondary | ICD-10-CM | POA: Diagnosis present

## 2023-01-24 DIAGNOSIS — N179 Acute kidney failure, unspecified: Secondary | ICD-10-CM | POA: Diagnosis present

## 2023-01-24 DIAGNOSIS — Z7982 Long term (current) use of aspirin: Secondary | ICD-10-CM

## 2023-01-24 DIAGNOSIS — G9349 Other encephalopathy: Secondary | ICD-10-CM | POA: Diagnosis present

## 2023-01-24 DIAGNOSIS — E872 Acidosis, unspecified: Secondary | ICD-10-CM | POA: Diagnosis present

## 2023-01-24 DIAGNOSIS — I6389 Other cerebral infarction: Principal | ICD-10-CM | POA: Diagnosis present

## 2023-01-24 DIAGNOSIS — Z8673 Personal history of transient ischemic attack (TIA), and cerebral infarction without residual deficits: Secondary | ICD-10-CM

## 2023-01-24 DIAGNOSIS — Z602 Problems related to living alone: Secondary | ICD-10-CM | POA: Diagnosis present

## 2023-01-24 DIAGNOSIS — Z791 Long term (current) use of non-steroidal anti-inflammatories (NSAID): Secondary | ICD-10-CM

## 2023-01-24 DIAGNOSIS — Z751 Person awaiting admission to adequate facility elsewhere: Secondary | ICD-10-CM

## 2023-01-24 LAB — CBC WITH DIFFERENTIAL/PLATELET
Abs Immature Granulocytes: 0.11 10*3/uL — ABNORMAL HIGH (ref 0.00–0.07)
Basophils Absolute: 0 10*3/uL (ref 0.0–0.1)
Basophils Relative: 0 %
Eosinophils Absolute: 0 10*3/uL (ref 0.0–0.5)
Eosinophils Relative: 0 %
HCT: 39.4 % (ref 36.0–46.0)
Hemoglobin: 11.8 g/dL — ABNORMAL LOW (ref 12.0–15.0)
Immature Granulocytes: 1 %
Lymphocytes Relative: 3 %
Lymphs Abs: 0.5 10*3/uL — ABNORMAL LOW (ref 0.7–4.0)
MCH: 29.4 pg (ref 26.0–34.0)
MCHC: 29.9 g/dL — ABNORMAL LOW (ref 30.0–36.0)
MCV: 98.3 fL (ref 80.0–100.0)
Monocytes Absolute: 1.1 10*3/uL — ABNORMAL HIGH (ref 0.1–1.0)
Monocytes Relative: 7 %
Neutro Abs: 14.5 10*3/uL — ABNORMAL HIGH (ref 1.7–7.7)
Neutrophils Relative %: 89 %
Platelets: 422 10*3/uL — ABNORMAL HIGH (ref 150–400)
RBC: 4.01 MIL/uL (ref 3.87–5.11)
RDW: 16.7 % — ABNORMAL HIGH (ref 11.5–15.5)
WBC: 16.3 10*3/uL — ABNORMAL HIGH (ref 4.0–10.5)
nRBC: 0.1 % (ref 0.0–0.2)

## 2023-01-24 LAB — I-STAT CHEM 8, ED
BUN: 36 mg/dL — ABNORMAL HIGH (ref 8–23)
Calcium, Ion: 1.26 mmol/L (ref 1.15–1.40)
Chloride: 121 mmol/L — ABNORMAL HIGH (ref 98–111)
Creatinine, Ser: 1.7 mg/dL — ABNORMAL HIGH (ref 0.44–1.00)
Glucose, Bld: 145 mg/dL — ABNORMAL HIGH (ref 70–99)
HCT: 39 % (ref 36.0–46.0)
Hemoglobin: 13.3 g/dL (ref 12.0–15.0)
Potassium: 3.7 mmol/L (ref 3.5–5.1)
Sodium: 148 mmol/L — ABNORMAL HIGH (ref 135–145)
TCO2: 16 mmol/L — ABNORMAL LOW (ref 22–32)

## 2023-01-24 LAB — COMPREHENSIVE METABOLIC PANEL
ALT: 15 U/L (ref 0–44)
AST: 30 U/L (ref 15–41)
Albumin: 3.5 g/dL (ref 3.5–5.0)
Alkaline Phosphatase: 50 U/L (ref 38–126)
Anion gap: 12 (ref 5–15)
BUN: 29 mg/dL — ABNORMAL HIGH (ref 8–23)
CO2: 15 mmol/L — ABNORMAL LOW (ref 22–32)
Calcium: 9.7 mg/dL (ref 8.9–10.3)
Chloride: 118 mmol/L — ABNORMAL HIGH (ref 98–111)
Creatinine, Ser: 1.67 mg/dL — ABNORMAL HIGH (ref 0.44–1.00)
GFR, Estimated: 32 mL/min — ABNORMAL LOW (ref >60)
Glucose, Bld: 143 mg/dL — ABNORMAL HIGH (ref 70–99)
Potassium: 3.7 mmol/L (ref 3.5–5.1)
Sodium: 145 mmol/L (ref 135–145)
Total Bilirubin: 1.1 mg/dL (ref <1.2)
Total Protein: 7.8 g/dL (ref 6.5–8.1)

## 2023-01-24 LAB — FOLATE: Folate: 14 ng/mL (ref >5.9)

## 2023-01-24 LAB — ETHANOL: Alcohol, Ethyl (B): <10 mg/dL (ref <10)

## 2023-01-24 LAB — RESP PANEL BY RT-PCR (RSV, FLU A&B, COVID)  RVPGX2
Influenza A by PCR: NEGATIVE
Influenza B by PCR: NEGATIVE
Resp Syncytial Virus by PCR: NEGATIVE
SARS Coronavirus 2 by RT PCR: NEGATIVE

## 2023-01-24 LAB — URINALYSIS, W/ REFLEX TO CULTURE (INFECTION SUSPECTED)
Bacteria, UA: NONE SEEN
Bilirubin Urine: NEGATIVE
Glucose, UA: NEGATIVE mg/dL
Ketones, ur: 5 mg/dL — AB
Nitrite: NEGATIVE
Protein, ur: >=300 mg/dL — AB
Specific Gravity, Urine: 1.02 (ref 1.005–1.030)
WBC, UA: >50 WBC/hpf (ref 0–5)
pH: 5 (ref 5.0–8.0)

## 2023-01-24 LAB — HEPARIN LEVEL (UNFRACTIONATED): Heparin Unfractionated: <0.1 [IU]/mL — ABNORMAL LOW (ref 0.30–0.70)

## 2023-01-24 LAB — VITAMIN B12: Vitamin B-12: 1071 pg/mL — ABNORMAL HIGH (ref 180–914)

## 2023-01-24 LAB — I-STAT CG4 LACTIC ACID, ED
Lactic Acid, Venous: 2 mmol/L (ref 0.5–1.9)
Lactic Acid, Venous: 1.4 mmol/L (ref 0.5–1.9)

## 2023-01-24 LAB — HIV ANTIBODY (ROUTINE TESTING W REFLEX): HIV Screen 4th Generation wRfx: NONREACTIVE

## 2023-01-24 LAB — TSH: TSH: 0.807 u[IU]/mL (ref 0.350–4.500)

## 2023-01-24 LAB — SALICYLATE LEVEL: Salicylate Lvl: <7 mg/dL — ABNORMAL LOW (ref 7.0–30.0)

## 2023-01-24 LAB — CK: Total CK: 293 U/L — ABNORMAL HIGH (ref 38–234)

## 2023-01-24 LAB — AMMONIA: Ammonia: 24 umol/L (ref 9–35)

## 2023-01-24 LAB — APTT: aPTT: 36 s (ref 24–36)

## 2023-01-24 MED ORDER — PIPERACILLIN-TAZOBACTAM 3.375 G IVPB
3.3750 g | Freq: Three times a day (TID) | INTRAVENOUS | Status: DC
  Administered 2023-01-25 – 2023-01-27 (×5): 3.375 g via INTRAVENOUS
  Filled 2023-01-24 (×5): qty 50

## 2023-01-24 MED ORDER — SODIUM CHLORIDE 0.9 % IV SOLN
2.0000 g | Freq: Once | INTRAVENOUS | Status: AC
  Administered 2023-01-24: 2 g via INTRAVENOUS
  Filled 2023-01-24: qty 12.5

## 2023-01-24 MED ORDER — ENOXAPARIN SODIUM 60 MG/0.6ML IJ SOSY: 60.0000 mg | PREFILLED_SYRINGE | INTRAMUSCULAR | Status: DC

## 2023-01-24 MED ORDER — METOPROLOL TARTRATE 5 MG/5ML IV SOLN
2.5000 mg | INTRAVENOUS | Status: DC | PRN
  Administered 2023-01-24 (×2): 2.5 mg via INTRAVENOUS
  Filled 2023-01-24 (×2): qty 5

## 2023-01-24 MED ORDER — VANCOMYCIN HCL IN DEXTROSE 1-5 GM/200ML-% IV SOLN
1000.0000 mg | INTRAVENOUS | Status: DC
  Administered 2023-01-26: 1000 mg via INTRAVENOUS
  Filled 2023-01-24: qty 200

## 2023-01-24 MED ORDER — ENOXAPARIN SODIUM 60 MG/0.6ML IJ SOSY
60.0000 mg | PREFILLED_SYRINGE | INTRAMUSCULAR | Status: DC
  Administered 2023-01-24: 60 mg via SUBCUTANEOUS
  Filled 2023-01-24: qty 0.6

## 2023-01-24 MED ORDER — METRONIDAZOLE 500 MG/100ML IV SOLN
500.0000 mg | Freq: Once | INTRAVENOUS | Status: AC
  Administered 2023-01-24: 500 mg via INTRAVENOUS
  Filled 2023-01-24: qty 100

## 2023-01-24 MED ORDER — HEPARIN SODIUM (PORCINE) 5000 UNIT/ML IJ SOLN: 5000.0000 [IU] | Freq: Three times a day (TID) | INTRAMUSCULAR | Status: DC

## 2023-01-24 MED ORDER — VANCOMYCIN HCL IN DEXTROSE 1-5 GM/200ML-% IV SOLN
1000.0000 mg | Freq: Once | INTRAVENOUS | Status: DC
Start: 2023-01-24 — End: 2023-01-24

## 2023-01-24 MED ORDER — SODIUM CHLORIDE 0.9% FLUSH
10.0000 mL | Freq: Two times a day (BID) | INTRAVENOUS | Status: DC
  Administered 2023-01-24 – 2023-01-28 (×7): 10 mL via INTRAVENOUS

## 2023-01-24 MED ORDER — SODIUM CHLORIDE 0.9 % IV SOLN: 2.0000 g | INTRAVENOUS | Status: DC

## 2023-01-24 MED ORDER — LACTATED RINGERS IV BOLUS
1000.0000 mL | Freq: Once | INTRAVENOUS | Status: AC
  Administered 2023-01-24: 1000 mL via INTRAVENOUS

## 2023-01-24 MED ORDER — LACTATED RINGERS IV BOLUS: 500.0000 mL | Freq: Once | INTRAVENOUS | Status: DC

## 2023-01-24 MED ORDER — VANCOMYCIN HCL 1250 MG/250ML IV SOLN
1250.0000 mg | Freq: Once | INTRAVENOUS | Status: AC
  Administered 2023-01-24: 1250 mg via INTRAVENOUS
  Filled 2023-01-24: qty 250

## 2023-01-24 MED ORDER — THIAMINE HCL 100 MG/ML IJ SOLN
100.0000 mg | Freq: Every day | INTRAMUSCULAR | Status: AC
  Administered 2023-01-24 – 2023-01-26 (×3): 100 mg via INTRAVENOUS
  Filled 2023-01-24 (×3): qty 2

## 2023-01-24 MED ORDER — LACTATED RINGERS IV BOLUS (SEPSIS)
500.0000 mL | Freq: Once | INTRAVENOUS | Status: AC
Start: 2023-01-24 — End: 2023-01-24
  Administered 2023-01-24: 500 mL via INTRAVENOUS

## 2023-01-24 NOTE — ED Notes (Signed)
Patient transported to CT 

## 2023-01-24 NOTE — Progress Notes (Signed)
Patient arrived from [address]. Vital signs taken, CHG bath done, placed on cardiac monitor, CCMD notified. Patient in no distress and has no complaints of pain.

## 2023-01-24 NOTE — ED Triage Notes (Signed)
Pt bib ems from home c/o AMS and weakness. LKW unknown. Family spoke with pt 01/23/2023 09:00. Aox3 Pt states time Feb 11. Chronic left leg issues. Pt ambulates with a cane and stays by herself.  Afib on blood thinner  HR 112 BP 172/100 CBG 164 O2 100%

## 2023-01-24 NOTE — Procedures (Signed)
Patient Name: Monica Conway  MRN: [id number]  Epilepsy Attending: Charlsie Quest  Referring Physician/Provider: Rocky Morel, DO  Date: 01/24/2023  Duration: 26.26 mins  Patient history: 77yo F with ams getting eeg to evaluate for seizure  Level of alertness: Awake  AEDs during EEG study: None  Technical aspects: This EEG study was done with scalp electrodes positioned according to the 10-20 International system of electrode placement. Electrical activity was reviewed with band pass filter of 1-70Hz , sensitivity of 7 uV/mm, display speed of 47mm/sec with a 60Hz  notched filter applied as appropriate. EEG data were recorded continuously and digitally stored.  Video monitoring was available and reviewed as appropriate.  Description: EEG showed continuous generalized 3 to 6 Hz theta-delta slowing. Generalized periodic discharges with triphasic morphology at  1-2 Hz were also noted. Hyperventilation and photic stimulation were not performed.     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 No seizures or definite epileptiform discharges were seen throughout the recording.  [name] Annabelle Harman

## 2023-01-24 NOTE — H&P (Signed)
Date: 01/24/2023               Patient Name:  Monica Conway MRN: [id number]  DOB: 1946/02/02 Age / Sex: 77 y.o., female   PCP: Ronnald Collum         Medical Service: Internal Medicine Teaching Service         Attending Physician: Dr. Inez Catalina, MD      First Contact: Dr. [name], MD Pager [phone]    Second Contact: Dr. Rocky Morel, DO Pager [phone]         After Hours (After 5p/  First Contact Pager: [phone]  weekends / holidays): Second Contact Pager: [phone]   SUBJECTIVE   Chief Complaint: altered mental status  History of Present Illness:  Patient is a 77 year old woman with history significant for HTN, osteoporosis, brought in by EMS after found down in her home with altered mental status. History obtained through ED signout and family. Patient was found laying on the floor at her house where she lives at home. Family states they last spoke to her on 11/28. Family called EMS when they were not able to get ahold of the patient, who gained entry into the home and found her on the floor and encephalopathic  On seeing the patient in the ED, she is oriented only to self and place. She cannot provide additional history. She is not in pain. No obvious injuries or tenderness.   ED Course: Tachycardic, hypertensive to 190s systolic Creatinine 1.7, baseline unknown CK 293, Lactate 2.0 -> 1.4 WBC 16.3 w/neutrophil predominance CXR, UA negative Blood culture, urine culture pending CT Head no acute findings  Past Medical History: Past Medical History:  Diagnosis Date   Arthritis    Asthma    Blood transfusion without reported diagnosis    Hypertension    Osteoporosis     Meds:  Current Outpatient Medications  Medication Instructions   acetaminophen (TYLENOL) 500 mg, Oral, 2 times daily PRN   amLODipine (NORVASC) 10 mg, Oral, Daily   aspirin EC 81 mg, Oral, 2 times daily with meals, To prevent blood clots   baclofen (LIORESAL) 10 mg, Oral, As needed    Cholecalciferol 1,000 Units, Oral, Daily   docusate sodium (COLACE) 100 mg, Oral, 2 times daily   ferrous sulfate 325 mg, Oral, Daily with breakfast   HYDROcodone-acetaminophen (NORCO/VICODIN) 5-325 MG tablet 1-2 tablets, Oral, Every 6 hours PRN   lisinopril (ZESTRIL) 2.5 mg, Oral, Daily   magnesium oxide (MAG-OX) 400 mg, Oral, 2 times daily   meloxicam (MOBIC) 15 mg, Oral, Daily PRN   methocarbamol (ROBAXIN) 500 mg, Oral, Every 6 hours PRN   Multiple Vitamin (MULTIVITAMIN WITH MINERALS) TABS tablet 1 tablet, Oral, Daily   Omega-3 Fatty Acids (OMEGA-3 FISH OIL PO) 15 mLs, Oral, Daily   omeprazole (PRILOSEC) 20 mg, Oral, Daily PRN   polyethylene glycol (MIRALAX / GLYCOLAX) 17 g, Oral, Daily    Past Surgical History:  Procedure Laterality Date   COLON SURGERY     TOTAL HIP ARTHROPLASTY Left 08/03/2015   Procedure: TOTAL LEFT HIP ARTHROPLASTY ANTERIOR APPROACH;  Surgeon: Samson Frederic, MD;  Location: [facility];  Service: Orthopedics;  Laterality: Left;   TUBAL LIGATION      Social:  Living Situation: lives alone Occupation: none Level of Function: PCP: Coralee Rud, PA-C Tobacco:  Alcohol: Drugs:  Family History: noncontributory  Allergies: Allergies as of 01/24/2023 - Review Complete 01/24/2023  Allergen Reaction Noted  Pollen extract Shortness Of Breath 02/04/2014    Review of Systems: A complete ROS was negative except as per HPI.   OBJECTIVE:   Physical Exam: Blood pressure (!) 184/88, pulse 98, temperature 98.8 F (37.1 C), temperature source Oral, resp. rate (!) 22, height 5\' 2"  (1.575 m), weight 59 kg, SpO2 98%.  Constitutional: ill-appearing, difficult to rouse, confused, responsive to questions with single word answers HENT: normocephalic atraumatic, mucous membranes moist Eyes: conjunctiva non-erythematous Neck: supple Cardiovascular: tachycardic Pulmonary/Chest: normal work of breathing on room air, lungs clear to auscultation bilaterally Abdominal:  soft, non-tender, non-distended MSK: normal bulk and tone Neurological: oriented to self and place. No focal deficits noted. Requires prompting to follow instructions.  Skin: warm and dry  Labs: CBC    Component Value Date/Time   WBC 16.3 (H) 01/24/2023 1028   RBC 4.01 01/24/2023 1028   HGB 13.3 01/24/2023 1037   HCT 39.0 01/24/2023 1037   PLT 422 (H) 01/24/2023 1028   MCV 98.3 01/24/2023 1028   MCH 29.4 01/24/2023 1028   MCHC 29.9 (L) 01/24/2023 1028   RDW 16.7 (H) 01/24/2023 1028   LYMPHSABS 0.5 (L) 01/24/2023 1028   MONOABS 1.1 (H) 01/24/2023 1028   EOSABS 0.0 01/24/2023 1028   BASOSABS 0.0 01/24/2023 1028     CMP     Component Value Date/Time   NA 148 (H) 01/24/2023 1037   K 3.7 01/24/2023 1037   CL 121 (H) 01/24/2023 1037   CO2 15 (L) 01/24/2023 1028   GLUCOSE 145 (H) 01/24/2023 1037   BUN 36 (H) 01/24/2023 1037   CREATININE 1.70 (H) 01/24/2023 1037   CALCIUM 9.7 01/24/2023 1028   PROT 7.8 01/24/2023 1028   ALBUMIN 3.5 01/24/2023 1028   AST 30 01/24/2023 1028   ALT 15 01/24/2023 1028   ALKPHOS 50 01/24/2023 1028   BILITOT 1.1 01/24/2023 1028   GFRNONAA 32 (L) 01/24/2023 1028   GFRAA >60 08/05/2015 0401    Imaging: DG Knee Complete 4 Views Left  Result Date: 01/24/2023 CLINICAL DATA:  Chronic left leg pain. EXAM: LEFT KNEE - COMPLETE 4+ VIEW COMPARISON:  None Available. FINDINGS: Moderate suprapatellar joint effusion is noted. No acute fracture or dislocation is noted. Moderate narrowing of medial and lateral joint spaces are noted. 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 Moderate suprapatellar joint effusion. No fracture or dislocation. Electronically Signed   By: Lupita Raider M.D.   On: 01/24/2023 12:19   DG Hip Unilat W or Wo Pelvis 2-3 Views Left  Result Date: 01/24/2023 CLINICAL DATA:  Chronic left lower extremity pain. EXAM: DG HIP (WITH OR WITHOUT PELVIS) 2-3V LEFT COMPARISON:  August 03, 2015. FINDINGS: Status post left total  hip arthroplasty. Probable subacute to old fracture is seen involving the left side of the pubic symphysis as well as the left superior pubic ramus. Right hip is unremarkable. IMPRESSION: Status post left total hip arthroplasty. Probable subacute to old fracture involving the left-sided pubic symphysis as well as left superior pubic ramus. Electronically Signed   By: Lupita Raider M.D.   On: 01/24/2023 12:18   DG Chest 1 View  Result Date: 01/24/2023 CLINICAL DATA:  Altered mental status, weakness. EXAM: CHEST  1 VIEW COMPARISON:  August 03, 2015. FINDINGS: The heart size and mediastinal contours are within normal limits. Moderate size hiatal hernia is noted. Left lung is clear. Right upper lobe nodular density is noted. The visualized skeletal structures are unremarkable. IMPRESSION: Right upper lobe nodular density  is noted. CT scan of the chest with intravenous contrast is recommended for further evaluation. Electronically Signed   By: Lupita Raider M.D.   On: 01/24/2023 12:16   CT Head Wo Contrast  Result Date: 01/24/2023 CLINICAL DATA:  Mental status change with unknown cause. EXAM: CT HEAD WITHOUT CONTRAST TECHNIQUE: Contiguous axial images were obtained from the base of the skull through the vertex without intravenous contrast. RADIATION DOSE REDUCTION: This exam was performed according to the departmental dose-optimization program which includes automated exposure control, adjustment of the mA and/or kV according to patient size and/or use of iterative reconstruction technique. COMPARISON:  02/05/2014 FINDINGS: Brain: No evidence of acute infarction, hemorrhage, hydrocephalus, extra-axial collection or mass lesion/mass effect. Chronic right occipital infarct since prior. Right basal ganglia perforator infarct, likely also chronic given discrete and low-density appearance, also new from prior. Vascular: No hyperdense vessel or unexpected calcification. Skull: Normal. Negative for fracture or focal  lesion. Sinuses/Orbits: Bilateral cataract resection IMPRESSION: 1. No acute finding. 2. 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 Electronically Signed   By: Tiburcio Pea M.D.   On: 01/24/2023 11:47    ASSESSMENT & PLAN:   Assessment & Plan by Problem: Principal Problem:   Acute encephalopathy   Monica Conway is a 77 y.o. person living with a history of HTN, osteoporosis who was brought in by EMS found down and encephalopathic on hospital day 0  #Altered mental status #Leukocytosis Unknown etiology of AMS. Tachycardic and hypertensive with leukocytosis to 16. UA and CXR negative, no obvious wounds as sources for sepsis. No focal deficits on neuro exam. CT head negative. No known history of seizures. No centrally acting medications on home med list.  - Started on cefepime, vanc, metronidazole for presumed sepsis - S/p LR - blood cultures x 2, urine cultures pending - RPR, TSH, salicylate level, ammonia, ethanol, folate, B12 pending - MRI brain pending - EEG pending  #AKI vs CKD Creatinine elevated to 1.7, unknown baseline. Continue to monitor with daily labs as patient receives fluids and resumes po intake  #Elevated lactate Initial lactate 2.0, downtrended to 1.4 after IV LR. Likely in setting of acute dehydration. Continue to monitor.   #Elevated CK Initial CK 293. Likely in setting of acute dehydration vs crush injury after found down for <2 days.   #Hyperglycemia Blood glucose elevated to 140s. Continue to monitor with daily CBG check  Diet: Normal VTE: Heparin IVF: None Code: Full  Prior to Admission Living Arrangement:  Anticipated Discharge Location: pending PT/OT eval Barriers to Discharge: pending medical stability  Signed: [name], MD Internal Medicine Resident PGY-1  01/24/2023, 3:12 PM

## 2023-01-24 NOTE — Progress Notes (Signed)
EEG complete - results pending 

## 2023-01-24 NOTE — ED Provider Notes (Signed)
Monica Conway EMERGENCY DEPARTMENT AT [facility] Provider Note   CSN: [id number] Arrival date & time: 01/24/23  4098     History  Chief Complaint  Patient presents with   Altered Mental Status    Monica Conway is a 77 y.o. female.  Patient is a 77 year old female who presents with altered mental status.  History is obtained through EMS and patient's family who came later.  Patient was found to be laying on the floor next to the sofa.  She lives by herself.  Family states that they last talked to her on Thanksgiving which was 2 days ago.  There was reported to someone talk to her yesterday but they cannot confirm that.  They have been calling yesterday and today and have not been able to get a hold of her.  They called EMS to ended up having law enforcement gain injury and found the patient on the floor.  Unclear how long she has been there.  Patient does not remember this happening.  She does not know why she was laying on the floor.  She does not complain of any pain other than in her left leg although she had stated she has had some ongoing pain in that leg.  She is not reporting any recent illnesses.  When I ask her if she has had any symptoms of UTI, she does say yes.       Home Medications Prior to Admission medications   Medication Sig Start Date End Date Taking? Authorizing Provider  acetaminophen (TYLENOL) 500 MG tablet Take 500 mg by mouth 2 (two) times daily as needed for mild pain or headache.     [provider]  amLODipine (NORVASC) 10 MG tablet Take 10 mg by mouth daily.    [provider]  aspirin EC 81 MG EC tablet Take 1 tablet (81 mg total) by mouth 2 (two) times daily with a meal. To prevent blood clots 08/05/15   [place], [facility], PA-C  baclofen (LIORESAL) 10 MG tablet Take 10 mg by mouth as needed for muscle spasms.     [provider]  Cholecalciferol 1000 UNITS capsule Take 1,000 Units by mouth daily.    [provider]   docusate sodium (COLACE) 100 MG capsule Take 1 capsule (100 mg total) by mouth 2 (two) times daily. 08/06/15   Rolly Salter, MD  ferrous sulfate 325 (65 FE) MG EC tablet Take 325 mg by mouth daily with breakfast.    [provider]  HYDROcodone-acetaminophen (NORCO/VICODIN) 5-325 MG tablet Take 1-2 tablets by mouth every 6 (six) hours as needed for moderate pain. 08/05/15   [place], Amber, PA-C  lisinopril (PRINIVIL,ZESTRIL) 2.5 MG tablet Take 2.5 mg by mouth daily. 06/15/15   [provider]  magnesium oxide (MAG-OX) 400 MG tablet Take 400 mg by mouth 2 (two) times daily.    [provider]  meloxicam (MOBIC) 15 MG tablet Take 15 mg by mouth daily as needed for pain.     [provider]  methocarbamol (ROBAXIN) 500 MG tablet Take 1 tablet (500 mg total) by mouth every 6 (six) hours as needed for muscle spasms. 08/06/15   Rolly Salter, MD  Multiple Vitamin (MULTIVITAMIN WITH MINERALS) TABS tablet Take 1 tablet by mouth daily.    [provider]  Omega-3 Fatty Acids (OMEGA-3 FISH OIL PO) Take 15 mLs by mouth daily.    [provider]  omeprazole (PRILOSEC) 20 MG capsule Take 20 mg  by mouth daily as needed (acid reflux).     [provider]  polyethylene glycol (MIRALAX / GLYCOLAX) packet Take 17 g by mouth daily. 08/06/15   Rolly Salter, MD      Allergies    Pollen extract    Review of Systems   Review of Systems  Unable to perform ROS: Mental status change    Physical Exam Updated Vital Signs BP (!) 190/89   Pulse (!) 102   Temp 99.5 F (37.5 C) (Rectal)   Resp (!) 22   Ht 5\' 2"  (1.575 m)   Wt 59 kg   SpO2 99%   BMI 23.78 kg/m  Physical Exam Constitutional:      Appearance: She is well-developed.  HENT:     Head: Normocephalic and atraumatic.  Eyes:     Pupils: Pupils are equal, round, and reactive to light.  Cardiovascular:     Rate and Rhythm: Normal rate and regular rhythm.     Heart sounds: Normal  heart sounds.  Pulmonary:     Effort: Pulmonary effort is normal. No respiratory distress.     Breath sounds: Normal breath sounds. No wheezing or rales.  Chest:     Chest wall: No tenderness.  Abdominal:     General: Bowel sounds are normal.     Palpations: Abdomen is soft.     Tenderness: There is no abdominal tenderness. There is no guarding or rebound.  Musculoskeletal:        General: Normal range of motion.     Cervical back: Normal range of motion and neck supple.     Comments: Tenderness on palpation of the left knee and left hip, no deformity noted  Lymphadenopathy:     Cervical: No cervical adenopathy.  Skin:    General: Skin is warm and dry.     Findings: No rash.  Neurological:     Mental Status: She is alert.     Comments: Oriented to person and place, confused to month and year.  She moves all extremities symmetrically without focal deficits, no slurred speech     ED Results / Procedures / Treatments   Labs (all labs ordered are listed, but only abnormal results are displayed) Labs Reviewed  COMPREHENSIVE METABOLIC PANEL - Abnormal; Notable for the following components:      Result Value   Chloride 118 (*)    CO2 15 (*)    Glucose, Bld 143 (*)    BUN 29 (*)    Creatinine, Ser 1.67 (*)    GFR, Estimated 32 (*)    All other components within normal limits  CBC WITH DIFFERENTIAL/PLATELET - Abnormal; Notable for the following components:   WBC 16.3 (*)    Hemoglobin 11.8 (*)    MCHC 29.9 (*)    RDW 16.7 (*)    Platelets 422 (*)    Neutro Abs 14.5 (*)    Lymphs Abs 0.5 (*)    Monocytes Absolute 1.1 (*)    Abs Immature Granulocytes 0.11 (*)    All other components within normal limits  URINALYSIS, W/ REFLEX TO CULTURE (INFECTION SUSPECTED) - Abnormal; Notable for the following components:   Color, Urine AMBER (*)    APPearance CLOUDY (*)    Hgb urine dipstick SMALL (*)    Ketones, ur 5 (*)    Protein, ur >=300 (*)    Leukocytes,Ua MODERATE (*)    All  other components within normal limits  CK - Abnormal; Notable for the  following components:   Total CK 293 (*)    All other components within normal limits  I-STAT CHEM 8, ED - Abnormal; Notable for the following components:   Sodium 148 (*)    Chloride 121 (*)    BUN 36 (*)    Creatinine, Ser 1.70 (*)    Glucose, Bld 145 (*)    TCO2 16 (*)    All other components within normal limits  I-STAT CG4 LACTIC ACID, ED - Abnormal; Notable for the following components:   Lactic Acid, Venous 2.0 (*)    All other components within normal limits  RESP PANEL BY RT-PCR (RSV, FLU A&B, COVID)  RVPGX2  CULTURE, BLOOD (ROUTINE X 2)  CULTURE, BLOOD (ROUTINE X 2)  URINE CULTURE  I-STAT CG4 LACTIC ACID, ED    EKG EKG Interpretation Date/Time:  Saturday January 24 2023 10:15:03 EST Ventricular Rate:  108 PR Interval:  47 QRS Duration:  91 QT Interval:  347 QTC Calculation: 466 R Axis:   -42  Text Interpretation: Sinus tachycardia Abnormal R-wave progression, early transition LVH with secondary repolarization abnormality Confirmed by Rolan Bucco [phone]) on 01/24/2023 1:49:20 PM  Radiology DG Knee Complete 4 Views Left  Result Date: 01/24/2023 CLINICAL DATA:  Chronic left leg pain. EXAM: LEFT KNEE - COMPLETE 4+ VIEW COMPARISON:  None Available. FINDINGS: Moderate suprapatellar joint effusion is noted. No acute fracture or dislocation is noted. Moderate narrowing of medial and lateral joint spaces are noted. 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 Moderate suprapatellar joint effusion. No fracture or dislocation. Electronically Signed   By: Lupita Raider M.D.   On: 01/24/2023 12:19   DG Hip Unilat W or Wo Pelvis 2-3 Views Left  Result Date: 01/24/2023 CLINICAL DATA:  Chronic left lower extremity pain. EXAM: DG HIP (WITH OR WITHOUT PELVIS) 2-3V LEFT COMPARISON:  August 03, 2015. FINDINGS: Status post left total hip arthroplasty. Probable subacute to old fracture is seen  involving the left side of the pubic symphysis as well as the left superior pubic ramus. Right hip is unremarkable. IMPRESSION: Status post left total hip arthroplasty. Probable subacute to old fracture involving the left-sided pubic symphysis as well as left superior pubic ramus. Electronically Signed   By: Lupita Raider M.D.   On: 01/24/2023 12:18   DG Chest 1 View  Result Date: 01/24/2023 CLINICAL DATA:  Altered mental status, weakness. EXAM: CHEST  1 VIEW COMPARISON:  August 03, 2015. FINDINGS: The heart size and mediastinal contours are within normal limits. Moderate size hiatal hernia is noted. Left lung is clear. Right upper lobe nodular density is noted. The visualized skeletal structures are unremarkable. IMPRESSION: Right upper lobe nodular density is noted. CT scan of the chest with intravenous contrast is recommended for further evaluation. Electronically Signed   By: Lupita Raider M.D.   On: 01/24/2023 12:16   CT Head Wo Contrast  Result Date: 01/24/2023 CLINICAL DATA:  Mental status change with unknown cause. EXAM: CT HEAD WITHOUT CONTRAST TECHNIQUE: Contiguous axial images were obtained from the base of the skull through the vertex without intravenous contrast. RADIATION DOSE REDUCTION: This exam was performed according to the departmental dose-optimization program which includes automated exposure control, adjustment of the mA and/or kV according to patient size and/or use of iterative reconstruction technique. COMPARISON:  02/05/2014 FINDINGS: Brain: No evidence of acute infarction, hemorrhage, hydrocephalus, extra-axial collection or mass lesion/mass effect. Chronic right occipital infarct since prior. Right basal ganglia perforator infarct, likely also chronic  given discrete and low-density appearance, also new from prior. Vascular: No hyperdense vessel or unexpected calcification. Skull: Normal. Negative for fracture or focal lesion. Sinuses/Orbits: Bilateral cataract resection  IMPRESSION: 1. No acute finding. 2. 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 Electronically Signed   By: Tiburcio Pea M.D.   On: 01/24/2023 11:47    Procedures Procedures    Medications Ordered in ED Medications  sodium chloride flush (NS) 0.9 % injection 10 mL (10 mLs Intravenous Given 01/24/23 1130)  vancomycin (VANCOREADY) IVPB 1250 mg/250 mL (1,250 mg Intravenous New Bag/Given 01/24/23 1241)  lactated ringers bolus 500 mL (0 mLs Intravenous Stopped 01/24/23 1316)  ceFEPIme (MAXIPIME) 2 g in sodium chloride 0.9 % 100 mL IVPB (0 g Intravenous Stopped 01/24/23 1238)  metroNIDAZOLE (FLAGYL) IVPB 500 mg (500 mg Intravenous New Bag/Given 01/24/23 1238)    ED Course/ Medical Decision Making/ A&P                                 Medical Decision Making Amount and/or Complexity of Data Reviewed Labs: ordered. Radiology: ordered.  Risk Prescription drug management.   Patient is a 77 year old female who presents confused.  She was found laying on the ground in her home.  There is no any suggestions of external trauma.  She has a little bit of bruising under her left eye but family says this is old.  She had a head CT which does not show any acute abnormality.  No evidence of intracranial hemorrhage.  Her vital signs are stable.  She is bit hypertensive.  Her rectal temp is 99.5.  Labs show an elevated WBC count.  Her lactate is elevated 2.  Appears that she is dehydrated with an elevated sodium and mild AKI.  She was given IV fluids.  She was started on septic protocol given her elevated white count, elevated lactate and altered mental status.  Urine is pending.  Chest x-ray was interpreted by me confirmed by the radiologist to show no evidence of pneumonia.  There is a right lung nodule which will need further evaluation with a CT chest with contrast.  X-rays of the right knee and right hip show some old appearing pubic rami fractures but no other acute fractures were  noted.  This was interpreted by me and confirmed by the radiologist.  I discussed the case with the hospitalist who will admit the patient for further treatment.  CRITICAL CARE Performed by: Rolan Bucco Total critical care time: 60 minutes Critical care time was exclusive of separately billable procedures and treating other patients. Critical care was necessary to treat or prevent imminent or life-threatening deterioration. Critical care was time spent personally by me on the following activities: development of treatment plan with patient and/or surrogate as well as nursing, discussions with consultants, evaluation of patient's response to treatment, examination of patient, obtaining history from patient or surrogate, ordering and performing treatments and interventions, ordering and review of laboratory studies, ordering and review of radiographic studies, pulse oximetry and re-evaluation of patient's condition.   Final Clinical Impression(s) / ED Diagnoses Final diagnoses:  Disorientation  Sepsis without acute organ dysfunction, due to unspecified organism [facility])    Rx / DC Orders ED Discharge Orders     None         Rolan Bucco, MD 01/24/23 1405

## 2023-01-24 NOTE — ED Notes (Signed)
RN notified provider pt last two SBP elevated 190 and 192

## 2023-01-24 NOTE — ED Notes (Signed)
ED TO INPATIENT HANDOFF REPORT  ED Nurse Name and Phone #: Carollee Herter [phone]  S Name/Age/Gender Monica Conway 77 y.o. female Room/Bed: RESUSC/RESUSC  Code Status   Code Status: Full Code  Home/SNF/Other Home Patient oriented to: self, place, and situation Is this baseline? No   Triage Complete: Triage complete  Chief Complaint Acute encephalopathy [G93.40]  Triage Note Pt bib ems from home c/o AMS and weakness. LKW unknown. Family spoke with pt 01/23/2023 09:00. Aox3 Pt states time Feb 11. Chronic left leg issues. Pt ambulates with a cane and stays by herself.  Afib on blood thinner  HR 112 BP 172/100 CBG 164 O2 100%   Allergies Allergies  Allergen Reactions   Pollen Extract Shortness Of Breath    Level of Care/Admitting Diagnosis ED Disposition     ED Disposition  Admit   Condition  --   Comment  Hospital Area: [facility] [[id number]]  Level of Care: Telemetry Medical [104]  May place patient in observation at [facility] or [facility] if equivalent level of care is available:: Yes  Covid Evaluation: Confirmed COVID Negative  Diagnosis: Acute encephalopathy [478295]  Admitting Physician: Inez Catalina [phone]  Attending Physician: Nena Polio          B Medical/Surgery History Past Medical History:  Diagnosis Date   Arthritis    Asthma    Blood transfusion without reported diagnosis    Hypertension    Osteoporosis    Past Surgical History:  Procedure Laterality Date   COLON SURGERY     TOTAL HIP ARTHROPLASTY Left 08/03/2015   Procedure: TOTAL LEFT HIP ARTHROPLASTY ANTERIOR APPROACH;  Surgeon: Samson Frederic, MD;  Location: [facility];  Service: Orthopedics;  Laterality: Left;   TUBAL LIGATION       A IV Location/Drains/Wounds Patient Lines/Drains/Airways Status     Active Line/Drains/Airways     Name Placement date Placement time Site Days   Peripheral IV 01/24/23 20 G Right Antecubital 01/24/23  1122  Antecubital   less than 1   External Urinary Catheter 01/24/23  1122  --  less than 1            Intake/Output Last 24 hours  Intake/Output Summary (Last 24 hours) at 01/24/2023 1442 Last data filed at 01/24/2023 1316 Gross per 24 hour  Intake 500 ml  Output --  Net 500 ml    Labs/Imaging Results for orders placed or performed during the hospital encounter of 01/24/23 (from the past 48 hour(s))  Comprehensive metabolic panel     Status: Abnormal   Collection Time: 01/24/23 10:28 AM  Result Value Ref Range   Sodium 145 135 - 145 mmol/L   Potassium 3.7 3.5 - 5.1 mmol/L   Chloride 118 (H) 98 - 111 mmol/L   CO2 15 (L) 22 - 32 mmol/L   Glucose, Bld 143 (H) 70 - 99 mg/dL    Comment: Glucose reference range applies only to samples taken after fasting for at least 8 hours.   BUN 29 (H) 8 - 23 mg/dL   Creatinine, Ser 0.86 (H) 0.44 - 1.00 mg/dL   Calcium 9.7 8.9 - 57.8 mg/dL   Total Protein 7.8 6.5 - 8.1 g/dL   Albumin 3.5 3.5 - 5.0 g/dL   AST 30 15 - 41 U/L   ALT 15 0 - 44 U/L   Alkaline Phosphatase 50 38 - 126 U/L   Total Bilirubin 1.1 <1.2 mg/dL   GFR, Estimated 32 (L) >60  mL/min    Comment: (NOTE) Calculated using the CKD-EPI Creatinine Equation (2021)    Anion gap 12 5 - 15    Comment: Performed at [facility], [address] [place], [place] [address]  CBC with Differential     Status: Abnormal   Collection Time: 01/24/23 10:28 AM  Result Value Ref Range   WBC 16.3 (H) 4.0 - 10.5 K/uL   RBC 4.01 3.87 - 5.11 MIL/uL   Hemoglobin 11.8 (L) 12.0 - 15.0 g/dL   HCT 84.6 96.2 - 95.2 %   MCV 98.3 80.0 - 100.0 fL   MCH 29.4 26.0 - 34.0 pg   MCHC 29.9 (L) 30.0 - 36.0 g/dL   RDW 84.1 (H) 32.4 - 40.1 %   Platelets 422 (H) 150 - 400 K/uL   nRBC 0.1 0.0 - 0.2 %   Neutrophils Relative % 89 %   Neutro Abs 14.5 (H) 1.7 - 7.7 K/uL   Lymphocytes Relative 3 %   Lymphs Abs 0.5 (L) 0.7 - 4.0 K/uL   Monocytes Relative 7 %   Monocytes Absolute 1.1 (H) 0.1 - 1.0 K/uL   Eosinophils  Relative 0 %   Eosinophils Absolute 0.0 0.0 - 0.5 K/uL   Basophils Relative 0 %   Basophils Absolute 0.0 0.0 - 0.1 K/uL   Immature Granulocytes 1 %   Abs Immature Granulocytes 0.11 (H) 0.00 - 0.07 K/uL    Comment: Performed at [facility], [address] [place], [place] [address]  CK     Status: Abnormal   Collection Time: 01/24/23 10:28 AM  Result Value Ref Range   Total CK 293 (H) 38 - 234 U/L    Comment: Performed at [facility], [address] [place], [place] [address]  I-stat chem 8, ED     Status: Abnormal   Collection Time: 01/24/23 10:37 AM  Result Value Ref Range   Sodium 148 (H) 135 - 145 mmol/L   Potassium 3.7 3.5 - 5.1 mmol/L   Chloride 121 (H) 98 - 111 mmol/L   BUN 36 (H) 8 - 23 mg/dL   Creatinine, Ser 0.34 (H) 0.44 - 1.00 mg/dL   Glucose, Bld 742 (H) 70 - 99 mg/dL    Comment: Glucose reference range applies only to samples taken after fasting for at least 8 hours.   Calcium, Ion 1.26 1.15 - 1.40 mmol/L   TCO2 16 (L) 22 - 32 mmol/L   Hemoglobin 13.3 12.0 - 15.0 g/dL   HCT 59.5 63.8 - 75.6 %  I-Stat Lactic Acid     Status: Abnormal   Collection Time: 01/24/23 10:38 AM  Result Value Ref Range   Lactic Acid, Venous 2.0 (HH) 0.5 - 1.9 mmol/L   Comment NOTIFIED PHYSICIAN   Resp panel by RT-PCR (RSV, Flu A&B, Covid) Anterior Nasal Swab     Status: None   Collection Time: 01/24/23 11:19 AM   Specimen: Anterior Nasal Swab  Result Value Ref Range   SARS Coronavirus 2 by RT PCR NEGATIVE NEGATIVE   Influenza A by PCR NEGATIVE NEGATIVE   Influenza B by PCR NEGATIVE NEGATIVE    Comment: (NOTE) The Xpert Xpress SARS-CoV-2/FLU/RSV plus assay is intended as an aid in the diagnosis of influenza from Nasopharyngeal swab specimens and should not be used as a sole basis for treatment. Nasal washings and aspirates are unacceptable for Xpert Xpress SARS-CoV-2/FLU/RSV testing.  Fact Sheet for Patients: BloggerCourse.com  Fact Sheet for  Healthcare Providers: SeriousBroker.it  This test is not yet approved or cleared by the [place] and has been authorized for detection and/or diagnosis of SARS-CoV-2 by FDA under an Emergency Use Authorization (EUA). This EUA will remain in effect (meaning this test can be used) for the duration of the COVID-19 declaration under Section 564(b)(1) of the Act, 21 U.S.C. section 360bbb-3(b)(1), unless the authorization is terminated or revoked.     Resp Syncytial Virus by PCR NEGATIVE NEGATIVE    Comment: (NOTE) Fact Sheet for Patients: BloggerCourse.com  Fact Sheet for Healthcare Providers: SeriousBroker.it  This test is not yet approved or cleared by the [place] FDA and has been authorized for detection and/or diagnosis of SARS-CoV-2 by FDA under an Emergency Use Authorization (EUA). This EUA will remain in effect (meaning this test can be used) for the duration of the COVID-19 declaration under Section 564(b)(1) of the Act, 21 U.S.C. section 360bbb-3(b)(1), unless the authorization is terminated or revoked.  Performed at [facility], [address], [place], [place] [address]   I-Stat Lactic Acid     Status: None   Collection Time: 01/24/23 12:14 PM  Result Value Ref Range   Lactic Acid, Venous 1.4 0.5 - 1.9 mmol/L  Urinalysis, w/ Reflex to Culture (Infection Suspected) -Urine, Clean Catch     Status: Abnormal   Collection Time: 01/24/23  1:17 PM  Result Value Ref Range   Specimen Source URINE, CLEAN CATCH    Color, Urine AMBER (A) YELLOW    Comment: BIOCHEMICALS MAY BE AFFECTED BY COLOR   APPearance CLOUDY (A) CLEAR   Specific Gravity, Urine 1.020 1.005 - 1.030   pH 5.0 5.0 - 8.0   Glucose, UA NEGATIVE NEGATIVE mg/dL   Hgb urine dipstick SMALL (A) NEGATIVE   Bilirubin Urine NEGATIVE NEGATIVE   Ketones, ur 5 (A) NEGATIVE mg/dL   Protein, ur >=191 (A) NEGATIVE mg/dL    Nitrite NEGATIVE NEGATIVE   Leukocytes,Ua MODERATE (A) NEGATIVE   RBC / HPF 0-5 0 - 5 RBC/hpf   WBC, UA >50 0 - 5 WBC/hpf    Comment:        Reflex urine culture not performed if WBC <=10, OR if Squamous epithelial cells >5. If Squamous epithelial cells >5 suggest recollection.    Bacteria, UA NONE SEEN NONE SEEN   Squamous Epithelial / HPF 0-5 0 - 5 /HPF    Comment: Performed at [facility], [address], [place], [place] [address]   DG Knee Complete 4 Views Left  Result Date: 01/24/2023 CLINICAL DATA:  Chronic left leg pain. EXAM: LEFT KNEE - COMPLETE 4+ VIEW COMPARISON:  None Available. FINDINGS: Moderate suprapatellar joint effusion is noted. No acute fracture or dislocation is noted. Moderate narrowing of medial and lateral joint spaces are noted. 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 Moderate suprapatellar joint effusion. No fracture or dislocation. Electronically Signed   By: Lupita Raider M.D.   On: 01/24/2023 12:19   DG Hip Unilat W or Wo Pelvis 2-3 Views Left  Result Date: 01/24/2023 CLINICAL DATA:  Chronic left lower extremity pain. EXAM: DG HIP (WITH OR WITHOUT PELVIS) 2-3V LEFT COMPARISON:  August 03, 2015. FINDINGS: Status post left total hip arthroplasty. Probable subacute to old fracture is seen involving the left side of the pubic symphysis as well as the left superior pubic ramus. Right hip is unremarkable. IMPRESSION: Status post left total hip arthroplasty. Probable subacute to old fracture involving the left-sided pubic symphysis as well  as left superior pubic ramus. Electronically Signed   By: Lupita Raider M.D.   On: 01/24/2023 12:18   DG Chest 1 View  Result Date: 01/24/2023 CLINICAL DATA:  Altered mental status, weakness. EXAM: CHEST  1 VIEW COMPARISON:  August 03, 2015. FINDINGS: The heart size and mediastinal contours are within normal limits. Moderate size hiatal hernia is noted. Left lung is clear. Right upper lobe nodular  density is noted. The visualized skeletal structures are unremarkable. IMPRESSION: Right upper lobe nodular density is noted. CT scan of the chest with intravenous contrast is recommended for further evaluation. Electronically Signed   By: Lupita Raider M.D.   On: 01/24/2023 12:16   CT Head Wo Contrast  Result Date: 01/24/2023 CLINICAL DATA:  Mental status change with unknown cause. EXAM: CT HEAD WITHOUT CONTRAST TECHNIQUE: Contiguous axial images were obtained from the base of the skull through the vertex without intravenous contrast. RADIATION DOSE REDUCTION: This exam was performed according to the departmental dose-optimization program which includes automated exposure control, adjustment of the mA and/or kV according to patient size and/or use of iterative reconstruction technique. COMPARISON:  02/05/2014 FINDINGS: Brain: No evidence of acute infarction, hemorrhage, hydrocephalus, extra-axial collection or mass lesion/mass effect. Chronic right occipital infarct since prior. Right basal ganglia perforator infarct, likely also chronic given discrete and low-density appearance, also new from prior. Vascular: No hyperdense vessel or unexpected calcification. Skull: Normal. Negative for fracture or focal lesion. Sinuses/Orbits: Bilateral cataract resection IMPRESSION: 1. No acute finding. 2. 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 Electronically Signed   By: Tiburcio Pea M.D.   On: 01/24/2023 11:47    Pending Labs Unresulted Labs (From admission, onward)     Start     Ordered   01/24/23 1317  Urine Culture  Once,   R        01/24/23 1317   01/24/23 1119  Culture, blood (routine x 2)  BLOOD CULTURE X 2,   R (with STAT occurrences)      01/24/23 1118            Vitals/Pain Today's Vitals   01/24/23 1330 01/24/23 1400 01/24/23 1406 01/24/23 1406  BP: (!) 190/89 (!) 184/88    Pulse: (!) 102 98    Resp: (!) 22 (!) 22    Temp:    98.8 F (37.1 C)  TempSrc:    Oral   SpO2: 99% 98%    Weight:      Height:      PainSc:   0-No pain     Isolation Precautions No active isolations  Medications Medications  sodium chloride flush (NS) 0.9 % injection 10 mL (10 mLs Intravenous Given 01/24/23 1130)  heparin injection 5,000 Units (has no administration in time range)  lactated ringers bolus 500 mL (has no administration in time range)  lactated ringers bolus 500 mL (0 mLs Intravenous Stopped 01/24/23 1316)  ceFEPIme (MAXIPIME) 2 g in sodium chloride 0.9 % 100 mL IVPB (0 g Intravenous Stopped 01/24/23 1238)  metroNIDAZOLE (FLAGYL) IVPB 500 mg (0 mg Intravenous Stopped 01/24/23 1405)  vancomycin (VANCOREADY) IVPB 1250 mg/250 mL (0 mg Intravenous Stopped 01/24/23 1442)    Mobility walks with device     Focused Assessments    R Recommendations: See Admitting Provider Note  Report given to:   Additional Notes: Pt baseline Aox4. She is u/t state the year. Pt stays alone and usually with it.

## 2023-01-24 NOTE — Sepsis Progress Note (Signed)
Sepsis protocol is beng followed by eLink.

## 2023-01-24 NOTE — Progress Notes (Addendum)
Pharmacy Antibiotic Note  Monica Conway is a 77 y.o. female admitted on 01/24/2023 with sepsis.  Pharmacy has been consulted for vancomycin and cefepime dosing.  Vancomycin 1250 mg IV load given at 12:41, cefepime 2 g IV given at 12:04, metronidazole 500 mg IV given at 12:38.  SCr elevated at 1.7 (unk baseline), WBC are elevated at 16.3, and Tm 100.3.  Plan: Vancomycin 1000 mg IV q48h Goal AUC 400-550, eAUC 449.9, SCr used 1.7, Vd 0.72 Cefepime 2 g IV q24h Monitor renal function, clinical progress, cultures/sensitivities F/U LOT and de-escalate as able Vancomycin levels as clinically indicated   Height: 5\' 2"  (157.5 cm) Weight: 59 kg (130 lb) IBW/kg (Calculated) : 50.1  Temp (24hrs), Avg:99.2 F (37.3 C), Min:98.1 F (36.7 C), Max:100.3 F (37.9 C)  Recent Labs  Lab 01/24/23 1028 01/24/23 1037 01/24/23 1038 01/24/23 1214  WBC 16.3*  --   --   --   CREATININE 1.67* 1.70*  --   --   LATICACIDVEN  --   --  2.0* 1.4    Estimated Creatinine Clearance: 22.3 mL/min (A) (by C-G formula based on SCr of 1.7 mg/dL (H)).    Allergies  Allergen Reactions   Pollen Extract Shortness Of Breath    Antimicrobials this admission: vancomycin 11/30 >>  cefepime 11/30 >>  metronidazole 11/30 x1  Dose adjustments this admission:   Microbiology results: 11/30 BCx:  11/30 UCx:    Thank you for involving pharmacy in this patient's care.  Loura Back, PharmD, BCPS Clinical Pharmacist Clinical phone for 01/24/2023 is [phone] 01/24/2023 4:29 PM   Addendum: Pharmacy consulted to change cefepime to Zosyn with concern for cefepime toxicity on EEG (has only had one dose).   Zosyn 3.375 g IV q8h to be infused over 4 hours Adjust dose if CrCl falls below 20 ml/min  Loura Back, PharmD, BCPS 7:26 PM

## 2023-01-24 NOTE — Progress Notes (Signed)
ED Pharmacy Antibiotic Sign Off An antibiotic consult was received from an ED provider for Cefepime and Vancomycin per pharmacy dosing for Sepsis. A chart review was completed to assess appropriateness.   The following one time order(s) were placed:  Cefepime 2g IV x1 Vancomycin 1250mg  IV x1   Further antibiotic and/or antibiotic pharmacy consults should be ordered by the admitting provider if indicated.   Thank you for allowing pharmacy to be a part of this patient's care.   Wilburn Cornelia, PharmD, BCPS Clinical Pharmacist 01/24/2023 11:36 AM   Please refer to [facility] for pharmacy phone number

## 2023-01-24 NOTE — ED Notes (Signed)
Phlebotomy got second blood cultures before abx started

## 2023-01-24 NOTE — Hospital Course (Addendum)
#  Ischemic infarct of right basal ganglia #Altered mental status Patient was brought in by EMS to the ED on 01/24/23 after found down at home where she lives alone. She was last seen normal by family two days prior. On admission she was encephalopathic and tachycardic. MRI brain showed 2.2 cm basal ganglia stroke on the right. Neurology was consulted, and home Eliquis was resumed. On hospital day 1 patient's mental status was much improved, though she continued to have waxing and waning confusion and delirium likely caused by her stroke and hospital delirium. No acute strength or sensory deficits were noted, though patient's functional status was severely decreased and PT/OT agreed patient would need rehabilitation in a SNF after hospital discharge.  Overall she will need continued risk factor modification with control of her atrial fibrillation, anticoagulation, and good blood pressure control.  #Sepsis #Leukocytosis Patient was hypertensive and tachycardic when she arrived to the ED with a leukocytosis to 16. UA and CXR were negative and patient had no obvious wounds as source for sepsis. Patient was started on empiric vancomycin, cefepime, and metronidazole. Two sets of blood cultures were taken. Patient's leukocytosis continued to downtrend with antibiotics and her functional status continued to improve. Blood cultures never showed growth of any bacteria. Antibiotics were discontinued on 12/3.  She did not have another fever and no definite source of infection was identified.  #Afib with RVR Patient with a history of Afib on Xarelto and metoprolol. Heart rates were increased to 180s in the setting of acute illness. A diltiazem drip was required multiple times to control HR. Patient was eventually transferred to TID dosing of metoprolol tartrate for rate control, then finally to daily dosing of metoprolol succinate.  Her dose had to be increased from admission to 150 mg daily.  #AKI Creatinine  initially elevated to 1.67, likely in the setting of decreased oral intake while she was down. Unknown baseline but creatinine trended down to 0.97 and fluctuated between this and 1.3 during admission.  #Diarrhea Patient had multiple daily episodes of diarrhea while admitted. Cdiff was considered, though leukocytosis was still downtrending and patient did not endorse abdominal pain or other systemic symptoms. Diarrhea improved with imodium.   #NAGMA NAGMA with bicarb to 15. Urine electrolytes indicate etiology likely early AKI. In the setting of diarrhea, etiology could also be GI losses. NAGMA resolved as patient's clinical status improved.   #Elevated lactate Lactate initially elevated to 2.0, likely in the setting of decreased oral intake and sepsis. Lactate downtrended with antibiotics and increased oral intake.   #Elevated CK CK initially elevated to 293, which peaked on hospital day 2 at 446 then continued to downtrend.  No signs of obstructive nephropathy but mild CK elevation likely due to prolonged downtime.  Low concern for seizure.  #Lung nodule Right upper lobe nodular density noted, CT chest with contrast recommended for further evaluation.  This was not done during admission due to AKI but I would recommend a follow-up chest x-ray and CT after this if its within the patient's goals to treat a possible malignancy.  #PCP follow up Patient told us her PCP (Dr Roxanne Mins) passed away recently. She has seen Dr Lollie Marrow at [facility] cardiology. It is unclear if she has a current PCP.  We recommend that she establishes with a PCP at [facility] to keep her care in one system.

## 2023-01-24 NOTE — ED Notes (Signed)
RN and NT attempted I/O. Pt only had a drop of urine come out. EDP aware

## 2023-01-24 NOTE — Progress Notes (Addendum)
PHARMACY - ANTICOAGULATION CONSULT NOTE  Pharmacy Consult for enoxaparin Indication: atrial fibrillation  Allergies  Allergen Reactions   Pollen Extract Shortness Of Breath    Patient Measurements: Height: 5\' 2"  (157.5 cm) Weight: 59 kg (130 lb) IBW/kg (Calculated) : 50.1  Vital Signs: Temp: 100.3 F (37.9 C) (11/30 1555) Temp Source: Axillary (11/30 1555) BP: 135/98 (11/30 1620) Pulse Rate: 169 (11/30 1620)  Labs: Recent Labs    01/24/23 1028 01/24/23 1037  HGB 11.8* 13.3  HCT 39.4 39.0  PLT 422*  --   CREATININE 1.67* 1.70*  CKTOTAL 293*  --     Estimated Creatinine Clearance: 22.3 mL/min (A) (by C-G formula based on SCr of 1.7 mg/dL (H)).   Medical History: Past Medical History:  Diagnosis Date   Arthritis    Asthma    Blood transfusion without reported diagnosis    Hypertension    Osteoporosis     Medications:  Medications Prior to Admission  Medication Sig Dispense Refill Last Dose   acetaminophen (TYLENOL) 500 MG tablet Take 500 mg by mouth 2 (two) times daily as needed for mild pain or headache.       amLODipine (NORVASC) 10 MG tablet Take 10 mg by mouth daily.      aspirin EC 81 MG EC tablet Take 1 tablet (81 mg total) by mouth 2 (two) times daily with a meal. To prevent blood clots 30 tablet 0    baclofen (LIORESAL) 10 MG tablet Take 10 mg by mouth as needed for muscle spasms.       Cholecalciferol 1000 UNITS capsule Take 1,000 Units by mouth daily.      docusate sodium (COLACE) 100 MG capsule Take 1 capsule (100 mg total) by mouth 2 (two) times daily. 10 capsule 0    ferrous sulfate 325 (65 FE) MG EC tablet Take 325 mg by mouth daily with breakfast.      HYDROcodone-acetaminophen (NORCO/VICODIN) 5-325 MG tablet Take 1-2 tablets by mouth every 6 (six) hours as needed for moderate pain. 60 tablet 0    lisinopril (PRINIVIL,ZESTRIL) 2.5 MG tablet Take 2.5 mg by mouth daily.      magnesium oxide (MAG-OX) 400 MG tablet Take 400 mg by mouth 2 (two) times  daily.      meloxicam (MOBIC) 15 MG tablet Take 15 mg by mouth daily as needed for pain.       methocarbamol (ROBAXIN) 500 MG tablet Take 1 tablet (500 mg total) by mouth every 6 (six) hours as needed for muscle spasms. 20 tablet 0    Multiple Vitamin (MULTIVITAMIN WITH MINERALS) TABS tablet Take 1 tablet by mouth daily.      Omega-3 Fatty Acids (OMEGA-3 FISH OIL PO) Take 15 mLs by mouth daily.      omeprazole (PRILOSEC) 20 MG capsule Take 20 mg by mouth daily as needed (acid reflux).       polyethylene glycol (MIRALAX / GLYCOLAX) packet Take 17 g by mouth daily. 14 each 0     Assessment: 24 yof admitted with AMS and started on antibiotics for sepsis. She has a history of Afib and is on Xarelto PTA. Pharmacy consulted to begin enoxaparin while Xarelto is on hold. Patient reports taking Xarelto this morning (11/30) however unsure if accurate info. SCr is elevated at 1.7 (unk baseline). No bleeding noted, H/H are normal, platelets are elevated.  Goal of Therapy:  Anti Xa level 1-2 units/ml 4 hrs after dose Monitor platelets by anticoagulation protocol: Yes   Plan:  Check baseline aPTT and heparin level Enoxaparin 60 mg SQ q24h - begin 12/1 AM CBC q72h while on enoxaparin Monitor for s/sx of bleeding  Thank you for involving pharmacy in this patient's care.  Loura Back, PharmD, BCPS Clinical Pharmacist Clinical phone for 01/24/2023 is [phone] 01/24/2023 4:28 PM   Addendum: Baseline heparin level is <0.1 and aPTT is 36 - seems Xarelto not on board.  Adjust enoxaparin to begin tonight  [facility], PharmD, BCPS 7:32 PM

## 2023-01-24 NOTE — Progress Notes (Signed)
   01/24/23 2312  Assess: MEWS Score  Temp 99.8 F (37.7 C)  BP 135/85  MAP (mmHg) 99  Pulse Rate (!) 128  ECG Heart Rate (!) 128  Resp 18  SpO2 96 %  O2 Device Room Air  Assess: MEWS Score  MEWS Temp 0  MEWS Systolic 0  MEWS Pulse 2  MEWS RR 0  MEWS LOC 0  MEWS Score 2  MEWS Score Color Yellow  Assess: if the MEWS score is Yellow or Red  Were vital signs accurate and taken at a resting state? Yes  Does the patient meet 2 or more of the SIRS criteria? No  MEWS guidelines implemented  Yes, yellow  Treat  MEWS Interventions Considered administering scheduled or prn medications/treatments as ordered  Take Vital Signs  Increase Vital Sign Frequency  Yellow: Q2hr x1, continue Q4hrs until patient remains green for 12hrs  Escalate  MEWS: Escalate Yellow: Discuss with charge nurse and consider notifying provider and/or RRT  Notify: Charge Nurse/RN  Name of Charge Nurse/RN Notified Corrie Mckusick  Provider Notification  Provider Name/Title Champ Mungo DO  Date Provider Notified 01/24/23  Time Provider Notified 2315  Method of Notification Page (text page)  Notification Reason Change in status (increased heart rate and afib)  Provider response See new orders  Date of Provider Response 01/24/23  Time of Provider Response 2317  Assess: SIRS CRITERIA  SIRS Temperature  0  SIRS Pulse 1  SIRS Respirations  0  SIRS WBC 0  SIRS Score Sum  1   Dr. August Saucer is placing orders for diltiazem for control of a-fib.

## 2023-01-24 NOTE — Progress Notes (Signed)
SLP Cancellation Note  Patient Details Name: Monica Conway MRN: [id number] DOB: 1945-12-04   Cancelled treatment:       Reason Eval/Treat Not Completed: Fatigue/lethargy limiting ability to participate Patient being transferred to progressive unit per RN with HR in the 180's. SLP will follow for readiness.  Angela Nevin, MA, CCC-SLP Speech Therapy

## 2023-01-24 NOTE — ED Notes (Signed)
Pt was found sitting on floor near couch. Pt is unsure how she got there. Pt denies falling. Pt doesn't seem sure of about the year.

## 2023-01-24 NOTE — ED Notes (Signed)
Family requesting pt legs be xray since she seems more in pain than usual. EDP notified

## 2023-01-25 ENCOUNTER — Inpatient Hospital Stay (HOSPITAL_COMMUNITY): Payer: Medicare HMO

## 2023-01-25 DIAGNOSIS — I6389 Other cerebral infarction: Secondary | ICD-10-CM

## 2023-01-25 DIAGNOSIS — G9340 Encephalopathy, unspecified: Secondary | ICD-10-CM

## 2023-01-25 LAB — BASIC METABOLIC PANEL
Anion gap: 8 (ref 5–15)
BUN: 21 mg/dL (ref 8–23)
CO2: 15 mmol/L — ABNORMAL LOW (ref 22–32)
Calcium: 8.9 mg/dL (ref 8.9–10.3)
Chloride: 122 mmol/L — ABNORMAL HIGH (ref 98–111)
Creatinine, Ser: 1.09 mg/dL — ABNORMAL HIGH (ref 0.44–1.00)
GFR, Estimated: 53 mL/min — ABNORMAL LOW (ref >60)
Glucose, Bld: 100 mg/dL — ABNORMAL HIGH (ref 70–99)
Potassium: 2.9 mmol/L — ABNORMAL LOW (ref 3.5–5.1)
Sodium: 145 mmol/L (ref 135–145)

## 2023-01-25 LAB — HEMOGLOBIN A1C
Hgb A1c MFr Bld: 5.1 % (ref 4.8–5.6)
Mean Plasma Glucose: 99.67 mg/dL

## 2023-01-25 LAB — CBC
HCT: 31.2 % — ABNORMAL LOW (ref 36.0–46.0)
Hemoglobin: 9.6 g/dL — ABNORMAL LOW (ref 12.0–15.0)
MCH: 29.4 pg (ref 26.0–34.0)
MCHC: 30.8 g/dL (ref 30.0–36.0)
MCV: 95.7 fL (ref 80.0–100.0)
Platelets: 356 10*3/uL (ref 150–400)
RBC: 3.26 MIL/uL — ABNORMAL LOW (ref 3.87–5.11)
RDW: 16.6 % — ABNORMAL HIGH (ref 11.5–15.5)
WBC: 15.4 10*3/uL — ABNORMAL HIGH (ref 4.0–10.5)
nRBC: 0.3 % — ABNORMAL HIGH (ref 0.0–0.2)

## 2023-01-25 LAB — URINE CULTURE

## 2023-01-25 LAB — LIPID PANEL
Cholesterol: 143 mg/dL (ref 0–200)
HDL: 46 mg/dL (ref >40)
LDL Cholesterol: 84 mg/dL (ref 0–99)
Total CHOL/HDL Ratio: 3.1 {ratio}
Triglycerides: 66 mg/dL (ref <150)
VLDL: 13 mg/dL (ref 0–40)

## 2023-01-25 LAB — ECHOCARDIOGRAM COMPLETE BUBBLE STUDY
AR max vel: 1.25 cm2
AV Area VTI: 1.23 cm2
AV Area mean vel: 1.17 cm2
AV Mean grad: 18.4 mm[Hg]
AV Peak grad: 31.4 mm[Hg]
Ao pk vel: 2.8 m/s
Area-P 1/2: 3.6 cm2
Calc EF: 64.6 %
S' Lateral: 2.8 cm
Single Plane A2C EF: 62.5 %
Single Plane A4C EF: 66.2 %

## 2023-01-25 LAB — GLUCOSE, CAPILLARY: Glucose-Capillary: 95 mg/dL (ref 70–99)

## 2023-01-25 LAB — MAGNESIUM: Magnesium: 1.4 mg/dL — ABNORMAL LOW (ref 1.7–2.4)

## 2023-01-25 LAB — OSMOLALITY, URINE: Osmolality, Ur: 549 mosm/kg (ref 300–900)

## 2023-01-25 LAB — RPR: RPR Ser Ql: NONREACTIVE

## 2023-01-25 LAB — NA AND K (SODIUM & POTASSIUM), RAND UR
Potassium Urine: 33 mmol/L
Sodium, Ur: 96 mmol/L

## 2023-01-25 LAB — CK
Total CK: 393 U/L — ABNORMAL HIGH (ref 38–234)
Total CK: 389 U/L — ABNORMAL HIGH (ref 38–234)

## 2023-01-25 MED ORDER — LACTATED RINGERS IV SOLN: INTRAVENOUS | Status: DC

## 2023-01-25 MED ORDER — POTASSIUM CHLORIDE 10 MEQ/100ML IV SOLN
10.0000 meq | INTRAVENOUS | Status: AC
  Administered 2023-01-25 (×2): 10 meq via INTRAVENOUS
  Filled 2023-01-25: qty 100

## 2023-01-25 MED ORDER — DILTIAZEM LOAD VIA INFUSION
15.0000 mg | Freq: Once | INTRAVENOUS | Status: AC
Start: 2023-01-25 — End: 2023-01-25
  Administered 2023-01-25: 15 mg via INTRAVENOUS
  Filled 2023-01-25: qty 15

## 2023-01-25 MED ORDER — APIXABAN 5 MG PO TABS: 5.0000 mg | ORAL_TABLET | Freq: Two times a day (BID) | ORAL | Status: DC

## 2023-01-25 MED ORDER — MAGNESIUM SULFATE 4 GM/100ML IV SOLN
4.0000 g | Freq: Once | INTRAVENOUS | Status: AC
  Administered 2023-01-25: 4 g via INTRAVENOUS
  Filled 2023-01-25: qty 100

## 2023-01-25 MED ORDER — ACETAMINOPHEN 500 MG PO TABS
1000.0000 mg | ORAL_TABLET | Freq: Three times a day (TID) | ORAL | Status: DC
  Administered 2023-01-25 – 2023-01-30 (×11): 1000 mg via ORAL
  Filled 2023-01-25 (×16): qty 2

## 2023-01-25 MED ORDER — DILTIAZEM HCL-DEXTROSE 125-5 MG/125ML-% IV SOLN (PREMIX)
5.0000 mg/h | INTRAVENOUS | Status: DC
  Administered 2023-01-25 – 2023-01-26 (×2): 5 mg/h via INTRAVENOUS
  Administered 2023-01-26: 10 mg/h via INTRAVENOUS
  Administered 2023-01-27: 5 mg/h via INTRAVENOUS
  Filled 2023-01-25 (×4): qty 125

## 2023-01-25 MED ORDER — APIXABAN 2.5 MG PO TABS: 2.5000 mg | ORAL_TABLET | Freq: Two times a day (BID) | ORAL | Status: DC

## 2023-01-25 MED ORDER — ENOXAPARIN SODIUM 60 MG/0.6ML IJ SOSY: 50.0000 mg | PREFILLED_SYRINGE | INTRAMUSCULAR | Status: DC

## 2023-01-25 MED ORDER — METOPROLOL SUCCINATE ER 25 MG PO TB24
25.0000 mg | ORAL_TABLET | Freq: Every day | ORAL | Status: DC
  Administered 2023-01-25 – 2023-01-26 (×2): 25 mg via ORAL
  Filled 2023-01-25 (×2): qty 1

## 2023-01-25 MED ORDER — STROKE: EARLY STAGES OF RECOVERY BOOK
Freq: Once | Status: AC
  Filled 2023-01-25: qty 1

## 2023-01-25 MED ORDER — OXYCODONE HCL 5 MG PO TABS: 2.5000 mg | ORAL_TABLET | Freq: Four times a day (QID) | ORAL | Status: DC | PRN

## 2023-01-25 MED ORDER — IOHEXOL 350 MG/ML SOLN
75.0000 mL | Freq: Once | INTRAVENOUS | Status: AC | PRN
  Administered 2023-01-25: 75 mL via INTRAVENOUS

## 2023-01-25 MED ORDER — APIXABAN 5 MG PO TABS
5.0000 mg | ORAL_TABLET | Freq: Two times a day (BID) | ORAL | Status: DC
  Administered 2023-01-25 – 2023-01-31 (×12): 5 mg via ORAL
  Filled 2023-01-25 (×12): qty 1

## 2023-01-25 NOTE — Progress Notes (Signed)
Echocardiogram 2D Echocardiogram has been performed.  Maeryn Mcgath N Tomeshia Pizzi,RDCS 01/25/2023, 2:42 PM

## 2023-01-25 NOTE — Progress Notes (Signed)
   01/25/23 0038  Vitals  BP (!) 100/53  MAP (mmHg) 68  BP Location Left Arm  BP Method Automatic  Patient Position (if appropriate) Lying  Pulse Rate 81  Pulse Rate Source Monitor  ECG Heart Rate 79  Resp 20  MEWS COLOR  MEWS Score Color Green  Oxygen Therapy  SpO2 95 %  O2 Device Room Air  MEWS Score  MEWS Temp 0  MEWS Systolic 1  MEWS Pulse 0  MEWS RR 0  MEWS LOC 0  MEWS Score 1   Vital signs after Cardizem bolus completed. Heart rate much improved still shows as a-fib on monitor. Continuous Cardizem gtt running at 5 mg per hour. Will continue to monitor heart rate and BP.

## 2023-01-25 NOTE — Evaluation (Signed)
Speech Language Pathology Evaluation Patient Details Name: Monica Conway MRN: [id number] DOB: [birth year] Today's Date: 01/25/2023 Time: 1914-7829 SLP Time Calculation (min) (ACUTE ONLY): 20 min  Problem List:  Patient Active Problem List   Diagnosis Date Noted   Acute encephalopathy 01/24/2023   Postoperative anemia due to acute blood loss 08/04/2015   Hypertension    Osteoporosis    Closed left hip fracture (HCC) 08/03/2015   Left displaced femoral neck fracture (HCC) 08/03/2015   Dehydration 02/05/2014   Lower urinary tract infectious disease 02/05/2014   Leukocytosis 02/05/2014   Sepsis (HCC) 02/05/2014   Encephalopathy acute 02/04/2014   SIRS (systemic inflammatory response syndrome) (HCC) 02/04/2014   Elevated LFTs 02/04/2014   Past Medical History:  Past Medical History:  Diagnosis Date   Arthritis    Asthma    Blood transfusion without reported diagnosis    Hypertension    Osteoporosis    Past Surgical History:  Past Surgical History:  Procedure Laterality Date   COLON SURGERY     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 female with PMH: a-fib, HTN, asthma, arthritis, osteoporosis. She was found down and encephalopathic by family after they went to check on her. She arrived at the ED via EMS on 01/24/23. BP was 190/89. 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   Assessment / Plan / Recommendation Clinical Impression  Patient presenting with a mild-moderate cognitive-linguistic impairment as per this evaluation. She did not exhibit any expressive language impairment but did exhibit  impairment of pragmatic language. Specific areas of cognitive impairment include: time orientation (stated "2000" as year but oriented to day of week, date, month), awareness, functional problem solving and reasoning, attention. She would become tangential and perseverative when talking with SLP and in addition, towards end of session, confusion seemed to increase with her telling SLP "there's a butterfly clip" on her straw/cup which was preventing her from being able to drink through it. Patient did correctly state that "I fell at home" but did not demonstrate any knowledge of having had a stroke and did not demonstrate any recall of medical staff who have come to see her this morning. Patient reports that at baseline, she lives by herself, is independent and still drives to grocery shop, etc. She mentioned that her neice Lucendia Herrlich and her brother in law [place] live close by. No family present to help determine her cognitive basline. SLP will follow while admitted and recommending skilled SLP services at next venue of care as well.    SLP Assessment  SLP Recommendation/Assessment: Patient needs continued Speech Lanaguage Pathology Services SLP Visit Diagnosis: Cognitive communication deficit (R41.841)    Recommendations for follow up therapy are one component of a multi-disciplinary discharge planning process, led by the attending physician.  Recommendations may be updated based on patient status, additional functional criteria and insurance authorization.    Follow Up Recommendations  Skilled nursing-short term rehab (<3 hours/day)    Assistance Recommended at Discharge  Frequent or constant Supervision/Assistance  Functional Status Assessment Patient has had a recent decline in their functional status and demonstrates the ability to make significant improvements in function in a  reasonable and predictable amount of time.  Frequency and Duration min 2x/week  2 weeks      SLP Evaluation Cognition   Overall Cognitive Status: Impaired/Different from baseline Arousal/Alertness: Awake/alert Orientation Level: Oriented to person;Oriented to place;Other (comment) (oriented to month/dow/date but not year) Year: 2020 Month: December Day of Week: Correct Attention: Sustained Sustained Attention: Impaired Sustained Attention Impairment: Verbal basic;Functional basic Memory: Impaired Memory Impairment: Decreased recall of new information Awareness: Impaired Awareness Impairment: Emergent impairment Problem Solving: Impaired Problem Solving Impairment: Functional basic Behaviors: Perseveration;Other (comment) (tangential) Safety/Judgment: Impaired       Comprehension  Auditory Comprehension Overall Auditory Comprehension: Other (comment) (WFL at basic level)    Expression Expression Primary Mode of Expression: Verbal Verbal Expression Overall Verbal Expression: Appears within functional limits for tasks assessed Pragmatics: Impairment Impairments: Abnormal affect;Topic maintenance   Oral / Motor  Oral Motor/Sensory Function Overall Oral Motor/Sensory Function: Within functional limits Motor Speech Overall Motor Speech: Appears within functional limits for tasks assessed Respiration: Within functional limits Resonance: Within functional limits Articulation: Within functional limitis Intelligibility: Intelligible           Angela Nevin, MA, CCC-SLP Speech Therapy

## 2023-01-25 NOTE — Progress Notes (Signed)
MD Annie Paras made aware of pt's increasing confusion and increased blood pressure. BP 182/87. Pt has pulled out both IVs.  No new orders received at this time.  MD said they would round on patient.

## 2023-01-25 NOTE — Consult Note (Signed)
NEUROLOGY CONSULT NOTE   Date of service: January 25, 2023 Patient Name: Monica Conway MRN:  [id number] DOB:  [birth year] Chief Complaint: "Altered mental status" Requesting Provider: Inez Catalina, MD  History of Present Illness  Monica Conway is a 77 y.o. female  has a past medical history of Arthritis, Asthma, Blood transfusion without reported diagnosis, Hypertension, and Osteoporosis.   She presented with altered mental status after being found confused at home.  She had last spoken to family on Thanksgiving.  They attempted to get a hold of her on the 29th and were unable to.  On the 30th they went over to check on her and found her on the floor next to the sofa.   LKW: 11/28 IV Thrombolysis: Out of window EVT: No LVO  NIHSS components Score: Comment  1a Level of Conscious 0[]  1[]  2[]  3[]      1b LOC Questions 0[]  1[]  2[]       1c LOC Commands 0[]  1[]  2[]       2 Best Gaze 0[]  1[]  2[]       3 Visual 0[]  1[]  2[]  3[]      4 Facial Palsy 0[]  1[]  2[]  3[]      5a Motor Arm - left 0[]  1[]  2[]  3[]  4[]  UN[]    5b Motor Arm - Right 0[]  1[]  2[]  3[]  4[]  UN[]    6a Motor Leg - Left 0[]  1[]  2[]  3[]  4[]  UN[]    6b Motor Leg - Right 0[]  1[]  2[]  3[]  4[]  UN[]    7 Limb Ataxia 0[]  1[]  2[]  3[]  UN[]     8 Sensory 0[]  1[]  2[]  UN[]      9 Best Language 0[]  1[]  2[]  3[]      10 Dysarthria 0[]  1[]  2[]  UN[]      11 Extinct. 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 She has never used smokeless tobacco. She reports that she does not drink alcohol and does not use drugs.  Allergies  Allergen Reactions   Pollen Extract Shortness Of Breath    Medications   Current Facility-Administered Medications:    diltiazem (CARDIZEM) 125 mg in dextrose 5% 125 mL (1 mg/mL) infusion, 5-15 mg/hr, Intravenous, Titrated, Monica Mungo, DO, Last Rate: 5 mL/hr at 01/25/23 0032, 5 mg/hr at 01/25/23 0032   enoxaparin (LOVENOX) injection 60 mg, 60 mg, Subcutaneous, Q24H, Monica Conway, RPH, 60 mg at 01/24/23 2135   metoprolol tartrate (LOPRESSOR) injection 2.5 mg, 2.5 mg, Intravenous, Q5 min PRN, Rocky Morel, DO, 2.5 mg at 01/24/23 1950   piperacillin-tazobactam (ZOSYN) IVPB 3.375 g, 3.375 g, Intravenous, Q8H, Cumming, Jennifer D, RPH   sodium chloride flush (NS) 0.9 % injection 10 mL, 10 mL, Intravenous, Q12H, Rolan Bucco, MD,  10 mL at 02/07/2023 1130   thiamine (VITAMIN B1) injection 100 mg, 100 mg, Intravenous, Daily, Rocky Morel, DO, 100 mg at 02-07-2023 1632   [START ON 01/26/2023] vancomycin (VANCOCIN) IVPB 1000 mg/200 mL premix, 1,000 mg, Intravenous, Q48H, Monica Conway, [place]  Vitals   Vitals:   01/25/23 0130 01/25/23 0200 01/25/23 0300 01/25/23 0309  BP: 132/82 (!) 140/72 139/85 (!) 140/86  Pulse: 94 97 98 99  Resp: (!) 24 20 20 19   Temp:    98.4 F (36.9 C)  TempSrc:    Oral  SpO2: 95% 95% 96% 96%  Weight:      Height:        Body mass index is 23.78 kg/m.  Physical Exam   Constitutional: Appears stated age  Neurologic Examination    Neuro: Mental Status: Patient is awake, alert, when asked what month it is, she replies " Thanksgiving" and when I asked what month that makes it, she replies " Thanksgiving" she gives the year is 2020, but does know where she is.  When I ask her to spell world she answers WORLA, I corrected her and she  spelled it correctly, then I asked her to spell it backwards and she answers ORLD Cranial Nerves: II: Visual Fields are full. Pupils are equal, round, and reactive to light.   III,IV, VI: EOMI without ptosis or diploplia.  V: Facial sensation is symmetric to temperature VII: Facial movement is symmetric.  VIII: hearing is intact to voice X: Uvula elevates symmetrically XII: tongue is midline without atrophy or fasciculations.  Motor: Tone is normal. Bulk is normal. 5/5 strength was present in bilateral upper extremities, and her lower extremities she is able to wiggle her toes, but has bilateral proximal weakness which the patient states is baseline for her. Sensory: Sensation is symmetric to light touch in the arms and legs. Cerebellar: FNF intact bilaterally        Labs/Imaging/Neurodiagnostic studies   CBC:  Recent Labs  Lab 02-07-23 1028 02-07-2023 1037 01/25/23 0341  WBC 16.3*  --  15.4*  NEUTROABS 14.5*  --   --   HGB 11.8* 13.3 9.6*  HCT 39.4 39.0 31.2*  MCV 98.3  --  95.7  PLT 422*  --  356   Basic Metabolic Panel:  Lab Results  Component Value Date   NA 145 01/25/2023   K 2.9 (L) 01/25/2023   CO2 15 (L) 01/25/2023   GLUCOSE 100 (H) 01/25/2023   BUN 21 01/25/2023   CREATININE 1.09 (H) 01/25/2023   CALCIUM 8.9 01/25/2023   GFRNONAA 53 (L) 01/25/2023   GFRAA >60 08/05/2015   Lipid Panel:  Lab Results  Component Value Date   LDLCALC 84 01/25/2023   HgbA1c:  Lab Results  Component Value Date   HGBA1C 5.1 01/25/2023   Urine Drug Screen: No results found for: "LABOPIA", "COCAINSCRNUR", "LABBENZ", "AMPHETMU", "THCU", "LABBARB"  Alcohol Level     Component Value Date/Time   ETH <10 02/07/23 1648   INR  Lab Results  Component Value Date   INR 0.90 08/03/2015   APTT  Lab Results  Component Value Date   APTT 36 2023-02-07   AED levels: No results found for: "PHENYTOIN", "ZONISAMIDE", "LAMOTRIGINE", "LEVETIRACETA"   MRI Brain(Personally  reviewed): Subacute appearing stroke involving the caudate head on the right  ASSESSMENT   RACHALE ANGELUCCI is a 77 y.o. female  has a past medical history of Arthritis, Asthma, Blood transfusion without reported diagnosis, Hypertension, and Osteoporosis.  She  has a sizable subcortical stroke which could be embolic in nature given her new onset atrial fibrillation.  She had already received therapeutic Lovenox prior to her MRI, but in any case is most likely already multiple days out from her stroke.  RECOMMENDATIONS  No need for antiplatelets while anticoagulated Lipids, A1c Echo PT, OT, ST Stroke team to follow  ______________________________________________________________________    Signed, Ritta Slot, MD [facility]

## 2023-01-25 NOTE — Progress Notes (Addendum)
STROKE TEAM PROGRESS NOTE   BRIEF HPI Ms. Monica Conway is a 77 y.o. female with history of A-fib on Xarelto, hypertension, osteoporosis, asthma, presenting after being found down and confused.  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  Patient is awake and alert in no apparent distress MRI brain shows a large 2 cm subacute ischemic infarct in right basal ganglia  States she takes Xarelto daily and has not missed any doses, however to the new ischemic stroke would recommend itching her to Eliquis 5 mg twice daily  Echo and CTA head and neck are pending  OBJECTIVE  CBC    Component Value Date/Time   WBC 15.4 (H) 01/25/2023 0341   RBC 3.26 (L) 01/25/2023 0341   HGB 9.6 (L) 01/25/2023 0341   HCT 31.2 (L) 01/25/2023 0341   PLT 356 01/25/2023 0341   MCV 95.7 01/25/2023 0341   MCH 29.4 01/25/2023 0341   MCHC 30.8 01/25/2023 0341   RDW 16.6 (H) 01/25/2023 0341   LYMPHSABS 0.5 (L) 01/24/2023 1028   MONOABS 1.1 (H) 01/24/2023 1028   EOSABS 0.0 01/24/2023 1028   BASOSABS 0.0 01/24/2023 1028    BMET    Component Value Date/Time   NA 145 01/25/2023 0341   K 2.9 (L) 01/25/2023 0341   CL 122 (H) 01/25/2023 0341   CO2 15 (L) 01/25/2023 0341   GLUCOSE 100 (H) 01/25/2023 0341   BUN 21 01/25/2023 0341   CREATININE 1.09 (H) 01/25/2023 0341   CALCIUM 8.9 01/25/2023 0341   GFRNONAA 53 (L) 01/25/2023 0341    IMAGING past 24 hours MR BRAIN WO CONTRAST  Result Date: 01/24/2023 CLINICAL DATA:  Initial evaluation for mental status change, unknown cause. EXAM: MRI HEAD WITHOUT CONTRAST TECHNIQUE: Multiplanar, multiecho pulse sequences of the brain and surrounding structures were obtained without intravenous contrast. COMPARISON:  Prior CT from earlier the same  day. FINDINGS: Brain: Cerebral volume within normal limits for age. Few scattered patchy foci of T2/FLAIR hyperintensity involving the periventricular deep white matter both cerebral hemispheres, consistent with chronic small vessel ischemic disease, mild in nature. Chronic right PCA distribution infarct involving the right occipital lobe noted. Mild chronic hemosiderin staining at this location. 2.2 cm focus of diffusion signal abnormality seen involving the anterior right basal ganglia. Associated T2/FLAIR signal intensity without ADC correlate. Finding is consistent with an evolving subacute ischemic infarct. No associated hemorrhage or mass effect. No other evidence for acute or subacute ischemia. Gray-white matter differentiation otherwise maintained. No acute intracranial hemorrhage. Additional single chronic microhemorrhage noted at the left periatrial white matter, of doubtful significance in isolation. No mass lesion, midline shift or mass effect. No hydrocephalus or extra-axial fluid collection. Pituitary gland and suprasellar region within normal limits. Vascular: Major intracranial vascular flow voids are maintained. Skull and upper cervical spine: Craniocervical junction normal. Bone marrow signal intensity within normal limits. No scalp soft tissue abnormality. Sinuses/Orbits: Prior bilateral ocular lens replacement. Paranasal sinuses are largely clear. No significant mastoid effusion. Other: None. IMPRESSION: 1. 2.2 cm evolving subacute ischemic infarct involving the anterior right basal ganglia. No associated hemorrhage or mass effect. 2. 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 Electronically Signed   By: Rise Mu M.D.   On: 01/24/2023 23:55   EEG  adult  Result Date: 01/24/2023 Charlsie Quest, MD     01/24/2023  5:30 PM Patient Name: Monica Conway MRN: [id number] Epilepsy Attending: Charlsie Quest Referring  Physician/Provider: Rocky Morel, DO Date: 01/24/2023 Duration: 26.26 mins Patient history: 77yo F with ams getting eeg to evaluate for seizure Level of alertness: Awake AEDs during EEG study: None Technical aspects: This EEG study was done with scalp electrodes positioned according to the 10-20 International system of electrode placement. Electrical activity was reviewed with band pass filter of 1-70Hz , sensitivity of 7 uV/mm, display speed of 54mm/sec with a 60Hz  notched filter applied as appropriate. EEG data were recorded continuously and digitally stored.  Video monitoring was available and reviewed as appropriate. Description: EEG showed continuous generalized 3 to 6 Hz theta-delta slowing. Generalized periodic discharges with triphasic morphology at  1-2 Hz were also noted. Hyperventilation and photic stimulation were not performed.   ABNORMALITY - Periodic discharges with triphasic morphology, generalized ( GPDs) - Continuous slow, generalized IMPRESSION: This study is suggestive of moderate diffuse encephalopathy most likely secondary to toxic-metabolic causes like cefepime toxicity. No seizures or definite epileptiform discharges were seen throughout the recording. Charlsie Quest   DG Knee Complete 4 Views Left  Result Date: 01/24/2023 CLINICAL DATA:  Chronic left leg pain. EXAM: LEFT KNEE - COMPLETE 4+ VIEW COMPARISON:  None Available. FINDINGS: Moderate suprapatellar joint effusion is noted. No acute fracture or dislocation is noted. Moderate narrowing of medial and lateral joint spaces are noted. 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 Moderate suprapatellar joint effusion. No fracture or dislocation. Electronically Signed   By: Lupita Raider M.D.   On: 01/24/2023 12:19   DG Hip Unilat W or Wo Pelvis 2-3 Views Left  Result Date: 01/24/2023 CLINICAL DATA:  Chronic left lower extremity pain. EXAM: DG HIP (WITH OR WITHOUT PELVIS) 2-3V LEFT COMPARISON:  August 03, 2015.  FINDINGS: Status post left total hip arthroplasty. Probable subacute to old fracture is seen involving the left side of the pubic symphysis as well as the left superior pubic ramus. Right hip is unremarkable. IMPRESSION: Status post left total hip arthroplasty. Probable subacute to old fracture involving the left-sided pubic symphysis as well as left superior pubic ramus. Electronically Signed   By: Lupita Raider M.D.   On: 01/24/2023 12:18   DG Chest 1 View  Result Date: 01/24/2023 CLINICAL DATA:  Altered mental status, weakness. EXAM: CHEST  1 VIEW COMPARISON:  August 03, 2015. FINDINGS: The heart size and mediastinal contours are within normal limits. Moderate size hiatal hernia is noted. Left lung is clear. Right upper lobe nodular density is noted. The visualized skeletal structures are unremarkable. IMPRESSION: Right upper lobe nodular density is noted. CT scan of the chest with intravenous contrast is recommended for further evaluation. Electronically Signed   By: Lupita Raider M.D.   On: 01/24/2023 12:16   CT Head Wo Contrast  Result Date: 01/24/2023 CLINICAL DATA:  Mental status change with unknown cause. EXAM: CT HEAD WITHOUT CONTRAST TECHNIQUE: Contiguous axial images were obtained from the base of the skull through the vertex without intravenous contrast. RADIATION DOSE REDUCTION: This exam was performed according to the departmental dose-optimization program which includes automated exposure control, adjustment of the mA and/or kV according to patient size and/or use of iterative reconstruction technique. COMPARISON:  02/05/2014 FINDINGS: Brain: No evidence of acute infarction, hemorrhage, hydrocephalus, extra-axial collection or mass lesion/mass effect. Chronic right occipital infarct since prior. Right basal ganglia  perforator infarct, likely also chronic given discrete and low-density appearance, also new from prior. Vascular: No hyperdense vessel or unexpected calcification. Skull:  Normal. Negative for fracture or focal lesion. Sinuses/Orbits: Bilateral cataract resection IMPRESSION: 1. No acute finding. 2. 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 Electronically Signed   By: Tiburcio Pea M.D.   On: 01/24/2023 11:47    Vitals:   01/25/23 0200 01/25/23 0300 01/25/23 0309 01/25/23 0500  BP: (!) 140/72 139/85 (!) 140/86 (!) 164/81  Pulse: 97 98 99 (!) 101  Resp: 20 20 19 20   Temp:   98.4 F (36.9 C)   TempSrc:   Oral   SpO2: 95% 96% 96% 95%  Weight:    50.7 kg  Height:         PHYSICAL EXAM General:  Alert, well-nourished, well-developed patient in no acute distress Psych:  Mood and affect appropriate for situation CV: Regular rate and rhythm on monitor Respiratory:  Regular, unlabored respirations on room air GI: Abdomen soft and nontender   NEURO:  Mental Status: AA&Ox2, patient is able to give clear and coherent history Speech/Language: speech is without dysarthria or aphasia.  Naming, repetition, fluency, and comprehension intact.  Increased distractibility.  Poor attention.  Poor insight into her illness.  Cranial Nerves:  II: PERRL. Visual fields full.  III, IV, VI: EOMI. Eyelids elevate symmetrically.  V: Sensation is intact to light touch and symmetrical to face.  VII: Face is symmetrical resting and smiling VIII: hearing intact to voice. IX, X: Palate elevates symmetrically. Phonation is normal.  ZO:[id number] shrug 5/5. XII: tongue is midline without fasciculations. Motor: 5/5 strength to all muscle groups tested.  Tone: is normal and bulk is normal Sensation- Intact to light touch bilaterally. Extinction absent to light touch to DSS.   Coordination: FTN intact bilaterally, HKS: no ataxia in BLE.No drift.  Gait- deferred  Most Recent NIH  1a Level of Conscious.: 0 1b LOC Questions: 0 1c LOC Commands: 0 2 Best Gaze: 0 3 Visual: 0 4 Facial Palsy: 0 5a Motor Arm - left: 0 5b Motor Arm - Right: 0 6a Motor Leg - Left:  2- DUE TO PAIN 6b Motor Leg - Right: 2 due to pain 7 Limb Ataxia: 0 8 Sensory: 0 9 Best Language: 0 10 Dysarthria: 0 11 Extinct. and Inatten.: 0 TOTAL: 4   ASSESSMENT/PLAN  Acute Ischemic Infarct:  right basal ganglia  Etiology: Cardio embolic in the setting of A-fib on Xarelto CT head No acute abnormality.  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  Bubble study negative.  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  Atrial fibrillation Home Meds: Xarelto, metoprolol Continue telemetry monitoring Recommend switching Xarelto to Eliquis 5 mg twice daily  Hypertension Home meds: Metoprolol Stable Blood Pressure Goal: BP less than 220/110   Hyperlipidemia Home meds: None LDL 84, goal < 70 Recommend starting statin Continue statin at discharge  Dysphagia Patient has post-stroke dysphagia, SLP consulted    Diet   Diet NPO time specified   Advance diet as tolerated  Other Active Problems Leukocytosis Anemia Hypokalemia AKI  Hospital day # 1  Gevena Mart DNP, ACNPC-AG  [facility]  I have personally obtained history,examined this patient, reviewed notes, independently viewed imaging studies, participated in medical decision making and plan of care.ROS completed by me personally and pertinent positives fully documented  I have made any additions or clarifications directly to the above note. Agree with note above.  Patient presented with confusion and altered mental status due to large right basal ganglia infarct likely of cardioembolic etiology from atrial fibrillation despite being on anticoagulation with Xarelto.   Recommend consider switching Xarelto to Eliquis for better compliance and overlap of duration of action due to twice daily dosing.  Discontinue r full dose Lovenox as it has been shown to be harmful in the setting of an acute stroke.  Continue ongoing stroke workup.  Mobilize out of bed.  Therapy consults.  Discussed with Dr. Criselda Peaches and teaching service.  Greater than 50% time during this 50-minute visit was spent on counseling and coordination of care about her embolic stroke and discussion about alternatives to Xarelto and risk-benefit and answering questions.  Stroke team will sign off.  Kindly call for questions.  Delia Heady, MD Medical Director [facility] Pager: [phone] 01/25/2023 3:34 PM   To contact Stroke Continuity provider, please refer to [email or web address]. After hours, contact General Neurology

## 2023-01-25 NOTE — Evaluation (Addendum)
Physical Therapy Evaluation Patient Details Name: LALENA STOLTZFOOS MRN: [id number] DOB: 10/04/1945 Today's Date: 01/25/2023  History of Present Illness  77 yo female admitted 11/30 after found confused at home with hypokalemia, new Afib and MRI showed a 2.2 cm evolving subacute ischemic infarct involving the anterior right basal ganglia. PMhx: Arthritis, Asthma, Lt THA, HTN  Clinical Impression  Pt anxious and apprehensive to mobility. Pt reported being independent in all mobility PTA. Pt required maxA to roll, with +2 assist to sit up and return to supine in bed. Pt was unable to stand in session stating she will be able to and ready to move by Tuesday. Pt limited by pain and cognitive status. Pt requiring increased time with all attempts at transfers with extensive cueing and education on initiation and completion of task. Pt would benefit from continued skilled acute physical therapy to maximize function and independence. Patient will benefit from continued inpatient follow up therapy, <3 hours/day.         If plan is discharge home, recommend the following: Two people to help with walking and/or transfers;Two people to help with bathing/dressing/bathroom;Assistance with cooking/housework;Assistance with feeding;Direct supervision/assist for medications management;Assist for transportation;Help with stairs or ramp for entrance;Supervision due to cognitive status   Can travel by private vehicle   No    Equipment Recommendations None recommended by PT  Recommendations for Other Services       Functional Status Assessment Patient has had a recent decline in their functional status and demonstrates the ability to make significant improvements in function in a reasonable and predictable amount of time.     Precautions / Restrictions Precautions Precautions: Fall Restrictions Weight Bearing Restrictions: No      Mobility  Bed Mobility Overal bed mobility: Needs Assistance Bed Mobility:  Rolling, Sidelying to Sit, Sit to Supine Rolling: Max assist Sidelying to sit: Max assist, +2 for physical assistance, HOB elevated, Used rails   Sit to supine: Max assist, +2 for physical assistance   General bed mobility comments: maxA to roll cues for sequencing, hand placement and physical assist, +2 to initiate trunk and LE movement when going to and from EOB    Transfers Overall transfer level: Needs assistance   Transfers: Sit to/from Stand Sit to Stand: Total assist           General transfer comment: pt unable to stand, limited by knee pain and did not initiate any lift off from EOB, 3 attempts    Ambulation/Gait                  Stairs            Wheelchair Mobility     Tilt Bed    Modified Rankin (Stroke Patients Only) Modified Rankin (Stroke Patients Only) Pre-Morbid Rankin Score: No symptoms Modified Rankin: Severe disability     Balance Overall balance assessment: Needs assistance Sitting-balance support: Feet supported, Feet unsupported, Single extremity supported Sitting balance-Leahy Scale: Fair Sitting balance - Comments: Held onto bed rail, when hand was removed pt wouuld search for it constantly Postural control: Posterior lean   Standing balance-Leahy Scale: Zero Standing balance comment: unable to stand in session                             Pertinent Vitals/Pain Pain Assessment Pain Assessment: Faces Faces Pain Scale: Hurts whole lot Pain Location: knees Pain Descriptors / Indicators: Discomfort, Crying, Moaning, Constant, Sharp Pain Intervention(s): Limited  activity within patient's tolerance, Monitored during session, Patient requesting pain meds-RN notified    Home Living Family/patient expects to be discharged to:: Private residence Living Arrangements: Alone Available Help at Discharge: Family;Available PRN/intermittently Type of Home: House Home Access: Stairs to enter   [employer] of  Steps: 1   Home Layout: One level Home Equipment: Cane - single point      Prior Function Prior Level of Function : Independent/Modified Independent             Mobility Comments: pt reports use of a cane ADLs Comments: pt reports she was independent with ADL, has family to check on her 1x/week     Extremity/Trunk Assessment   Upper Extremity Assessment Upper Extremity Assessment: Defer to OT evaluation RUE Deficits / Details: resting tremor, pt reports this is not baseline;strength grossly 4/5 RUE Sensation: WNL RUE Coordination: WNL LUE Deficits / Details: strength 4/5 LUE Sensation: WNL LUE Coordination: WNL    Lower Extremity Assessment Lower Extremity Assessment: RLE deficits/detail;LLE deficits/detail RLE Deficits / Details: reports of pressure and pain when donning sock, any movement increased pain, unable to initiate quads during standing attempts RLE Sensation: WNL LLE Deficits / Details: any movement increased pain, unable to initiate quads during standing attempts       Communication   Communication Communication: No apparent difficulties Cueing Techniques: Verbal cues;Tactile cues;Visual cues  Cognition Arousal: Alert Behavior During Therapy: Restless, Anxious Overall Cognitive Status: Impaired/Different from baseline Area of Impairment: Attention, Memory, Following commands, Safety/judgement, Awareness, Problem solving                   Current Attention Level: Focused Memory: Decreased recall of precautions, Decreased short-term memory Following Commands: Follows one step commands with increased time, Follows one step commands inconsistently Safety/Judgement: Decreased awareness of safety, Decreased awareness of deficits Awareness: Intellectual Problem Solving: Slow processing, Difficulty sequencing, Requires verbal cues, Requires tactile cues, Decreased initiation General Comments: Pt AOX4, was concerned about "the butterfly" on her straw,  was worried it was snowing when looking out the window to clear skies because her ride could not get her if it was snowing, pt scared she would be arrested for stealing lines attached to her while moving in room        General Comments General comments (skin integrity, edema, etc.): RR 35-40 at times SpO2 100% on RA HR 80s    Exercises     Assessment/Plan    PT Assessment Patient needs continued PT services  PT Problem List Decreased strength;Decreased range of motion;Decreased activity tolerance;Decreased balance;Decreased mobility;Decreased coordination;Decreased cognition;Decreased knowledge of use of DME;Decreased safety awareness       PT Treatment Interventions DME instruction;Gait training;Therapeutic activities;Therapeutic exercise;Cognitive remediation;Patient/family education;Balance training;Functional mobility training;Stair training    PT Goals (Current goals can be found in the Care Plan section)  Acute Rehab PT Goals Patient Stated Goal: go home and never come back to hospital PT Goal Formulation: With patient Time For Goal Achievement: 02/08/23 Potential to Achieve Goals: Fair    Frequency Min 1X/week     Co-evaluation               AM-PAC PT "6 Clicks" Mobility  Outcome Measure Help needed turning from your back to your side while in a flat bed without using bedrails?: A Lot Help needed moving from lying on your back to sitting on the side of a flat bed without using bedrails?: Total Help needed moving to and from a bed to a chair (  including a wheelchair)?: Total Help needed standing up from a chair using your arms (e.g., wheelchair or bedside chair)?: Total Help needed to walk in hospital room?: Total Help needed climbing 3-5 steps with a railing? : Total 6 Click Score: 7    End of Session   Activity Tolerance: Patient limited by pain;Treatment limited secondary to agitation Patient left: in bed;with call bell/phone within reach;with nursing/sitter  in room Nurse Communication: Mobility status;Patient requests pain meds PT Visit Diagnosis: Unsteadiness on feet (R26.81);Other abnormalities of gait and mobility (R26.89);Muscle weakness (generalized) (M62.81);Difficulty in walking, not elsewhere classified (R26.2)    Time: 3474-2595 PT Time Calculation (min) (ACUTE ONLY): 35 min   Charges:   PT Evaluation $PT Eval Moderate Complexity: 1 Mod PT Treatments $Therapeutic Activity: 8-22 mins PT General Charges $$ ACUTE PT VISIT: 1 Visit         Andrey Farmer SPT Secure chat preferred   Darlin Drop 01/25/2023, 11:49 AM

## 2023-01-25 NOTE — Evaluation (Signed)
Clinical/Bedside Swallow Evaluation Patient Details  Name: Monica Conway MRN: [id number] Date of Birth: [birth year]  Today's Date: 01/25/2023 Time: SLP Start Time (ACUTE ONLY): 0915 SLP Stop Time (ACUTE ONLY): 0935 SLP Time Calculation (min) (ACUTE ONLY): 20 min  Past Medical History:  Past Medical History:  Diagnosis Date   Arthritis    Asthma    Blood transfusion without reported diagnosis    Hypertension    Osteoporosis    Past Surgical History:  Past Surgical History:  Procedure Laterality Date   COLON SURGERY     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 female with PMH: a-fib, HTN, asthma, arthritis, osteoporosis. She was found down and encephalopathic by family after they went to check on her. She arrived at the ED via EMS on 01/24/23. BP was 190/89. 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    Assessment / Plan / Recommendation  Clinical Impression  Patient presents with what appears to be a primary cognitive-based dysphagia as per this bedside swallow evaluation, but cannot r/o pharyngeal phase dysphagia as well. When drinking through straw, patient would sip liquid from straw but not form a tight seal on straw and did not demonstrate ability to suck through straw. When asked, she said "there's a butterfly clip" on the straw which was preventing her from using it effectively. Aside from suspected swallow initiation delay, no overt s/s aspiration with thin liquids or puree solids. SLP recommending to initiate PO diet of Dys 2 (minced) solids, thin liquids with likely need for full supervision  secondary to cognitive impairment impacting her ability to perform functional tasks/ADL's. SLP will follow while admitted. SLP Visit Diagnosis: Dysphagia, unspecified (R13.10)    Aspiration Risk  Mild aspiration risk    Diet Recommendation Dysphagia 2 (Fine chop);Thin liquid    Liquid Administration via: Cup;Straw Medication Administration: Whole meds with puree Supervision: Patient able to self feed;Full supervision/cueing for compensatory strategies Compensations: Slow rate;Small sips/bites;Minimize environmental distractions Postural Changes: Seated upright at 90 degrees    Other  Recommendations Oral Care Recommendations: Oral care BID    Recommendations for follow up therapy are one component of a multi-disciplinary discharge planning process, led by the attending physician.  Recommendations may be updated based on patient status, additional functional criteria and insurance authorization.  Follow up Recommendations Skilled nursing-short term rehab (<3 hours/day)      Assistance Recommended at Discharge Frequent or constant Supervision/Assistance  Functional Status Assessment Patient has had a recent decline in their functional status and demonstrates the ability to make significant improvements in function in a reasonable and predictable amount of time.  Frequency and Duration min 2x/week  2 weeks       Prognosis Prognosis for improved oropharyngeal function: Good Barriers to Reach Goals: Cognitive deficits      Swallow Study   General Date of Onset: 01/24/23 HPI: Patient is a 77 y.o. female with PMH: a-fib, HTN, asthma, arthritis, osteoporosis. She was found down and encephalopathic by family after they went to check on her. She arrived at the ED via EMS on 01/24/23. BP was 190/89. MRI brain showed evolving subacute ischemic infarct involving anterior right  basal ganglia with no associated hemorrhage or mass effect; nderlying mild chronic microvascular ischemic disease with  chronic right PCA distribution infarct. Type of Study: Bedside Swallow Evaluation Previous Swallow Assessment: none found Diet Prior to this Study: NPO Temperature Spikes Noted: No Respiratory Status: Room air History of Recent Intubation: No Behavior/Cognition: Alert;Cooperative;Pleasant mood;Confused;Requires cueing Oral Cavity Assessment: Within Functional Limits Oral Care Completed by SLP: No Oral Cavity - Dentition: Dentures, top Vision: Functional for self-feeding Self-Feeding Abilities: Needs assist;Needs set up;Able to feed self Patient Positioning: Upright in bed Baseline Vocal Quality: Normal Volitional Cough: Cognitively unable to elicit Volitional Swallow: Unable to elicit    Oral/Motor/Sensory Function Overall Oral Motor/Sensory Function: Within functional limits   Ice Chips     Thin Liquid Thin Liquid: Impaired Presentation: Straw Oral Phase Impairments: Poor awareness of bolus;Other (comment);Reduced labial seal (patient sipping small amounts from straw but not demonstrating ability to suck through straw) Pharyngeal  Phase Impairments: Suspected delayed Swallow    Nectar Thick     Honey Thick     Puree Puree: Within functional limits Presentation: Spoon   Solid     Solid: Not tested     Angela Nevin, MA, CCC-SLP Speech Therapy

## 2023-01-25 NOTE — Progress Notes (Signed)
   01/25/23 0034  Vitals  Temp 99 F (37.2 C)  Temp Source Oral  BP (!) 141/79  MAP (mmHg) 91  BP Location Left Arm  BP Method Automatic  Patient Position (if appropriate) Lying  Pulse Rate (!) 135  Pulse Rate Source Monitor  ECG Heart Rate (!) 132  Resp 20  Level of Consciousness  Level of Consciousness Alert  MEWS COLOR  MEWS Score Color Yellow  Oxygen Therapy  SpO2 96 %  O2 Device Room Air  Pain Assessment  Pain Scale 0-10  Pain Score 0  MEWS Score  MEWS Temp 0  MEWS Systolic 0  MEWS Pulse 3  MEWS RR 0  MEWS LOC 0  MEWS Score 3   Cardizem gtt bolus started.

## 2023-01-25 NOTE — Progress Notes (Signed)
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  Prior to return of MRI read, plan was to start diltiazem load and infusion due to persistent atrial fibrillation with RVR despite IVF resuscitation. Patient has been asymptomatic and normotensive through this.  With new imaging reads, I spoke with neurology due to concern over BP parameters based on this MRI read. Dr. Amada Jupiter is going to assess Ms. Ohmer and believes starting diltiazem is okay at the time of our conversation. Patient has received lovenox 1x this admission, is not due for next dose until 12/01 evening.   I have communicated this update with her nurse, Jasmine December, and placed orders for diltiazem load and infusion.  I have also added additional CVA work-up studies to be collected with AM labs including HbA1c, lipid panel. I have ordered an echocardiogram. Consider CTA head/neck in AM with hopeful improvement of renal function. Will await additional recommendations from neurology service.   Champ Mungo, DO Internal Medicine PGY-3

## 2023-01-25 NOTE — Progress Notes (Addendum)
Subjective:   Summary: Monica Conway is a 77 y.o. person living with a history of Afib, HTN, osteoporosis who was brought in by EMS found down and encephalopathic, found to have subacute   Hospital Day 1  Overnight Events: Neurology consulted for acute stroke findings on MRI. Diltiazem infusion started for Afib with RVR  Subjective:  Patient is alert and oriented this morning, still with some mild confusion. She feels well and is able to provide some details about her history. She states she fell on Thanksgiving day. Denies any weakness or sensory loss today. Requested her niece Lucendia Herrlich be informed about her status.   Objective:  Vital signs in last 24 hours: Vitals:   01/25/23 0300 01/25/23 0309 01/25/23 0500 01/25/23 0805  BP: 139/85 (!) 140/86 (!) 164/81 (!) 160/64  Pulse: 98 99 (!) 101 92  Resp: 20 19 20 15   Temp:  98.4 F (36.9 C)  98.1 F (36.7 C)  TempSrc:  Oral  Oral  SpO2: 96% 96% 95% 98%  Weight:   50.7 kg   Height:       Supplemental O2: Room Air SpO2: 98 %   Physical Exam:  Constitutional: in no acute distress Cardiovascular: 2/6 systolic murmur Pulmonary/Chest: normal work of breathing on room air, lungs clear to auscultation bilaterally Abdominal: soft, non-tender, non-distended Neuro: A&O x 4, encephalopathy greatly improved. 5/5 strength, no focal deficit noted Skin: warm and dry Extremities: upper/lower extremity pulses 2+, no lower extremity edema present   Intake/Output Summary (Last 24 hours) at 01/25/2023 0820 Last data filed at 01/25/2023 7829 Gross per 24 hour  Intake 1776.32 ml  Output 800 ml  Net 976.32 ml   Net IO Since Admission: 976.32 mL [01/25/23 0820]  Pertinent Labs:    Latest Ref Rng & Units 01/25/2023    3:41 AM 01/24/2023   10:37 AM 01/24/2023   10:28 AM  CBC  WBC 4.0 - 10.5 K/uL 15.4   16.3   Hemoglobin 12.0 - 15.0 g/dL 9.6  56.2  13.0   Hematocrit 36.0 - 46.0 % 31.2  39.0  39.4   Platelets 150 - 400 K/uL  356   422        Latest Ref Rng & Units 01/25/2023    3:41 AM 01/24/2023   10:37 AM 01/24/2023   10:28 AM  CMP  Glucose 70 - 99 mg/dL 865  784  696   BUN 8 - 23 mg/dL 21  36  29   Creatinine 0.44 - 1.00 mg/dL 2.95  2.84  1.32   Sodium 135 - 145 mmol/L 145  148  145   Potassium 3.5 - 5.1 mmol/L 2.9  3.7  3.7   Chloride 98 - 111 mmol/L 122  121  118   CO2 22 - 32 mmol/L 15   15   Calcium 8.9 - 10.3 mg/dL 8.9   9.7   Total Protein 6.5 - 8.1 g/dL   7.8   Total Bilirubin <1.2 mg/dL   1.1   Alkaline Phos 38 - 126 U/L   50   AST 15 - 41 U/L   30   ALT 0 - 44 U/L   15     Imaging: MR BRAIN WO CONTRAST  Result Date: 01/24/2023 CLINICAL DATA:  Initial evaluation for mental status change, unknown cause. EXAM: MRI HEAD WITHOUT CONTRAST TECHNIQUE: Multiplanar, multiecho pulse sequences of the brain and  surrounding structures were obtained without intravenous contrast. COMPARISON:  Prior CT from earlier the same day. FINDINGS: Brain: Cerebral volume within normal limits for age. Few scattered patchy foci of T2/FLAIR hyperintensity involving the periventricular deep white matter both cerebral hemispheres, consistent with chronic small vessel ischemic disease, mild in nature. Chronic right PCA distribution infarct involving the right occipital lobe noted. Mild chronic hemosiderin staining at this location. 2.2 cm focus of diffusion signal abnormality seen involving the anterior right basal ganglia. Associated T2/FLAIR signal intensity without ADC correlate. Finding is consistent with an evolving subacute ischemic infarct. No associated hemorrhage or mass effect. No other evidence for acute or subacute ischemia. Gray-white matter differentiation otherwise maintained. No acute intracranial hemorrhage. Additional single chronic microhemorrhage noted at the left periatrial white matter, of doubtful significance in isolation. No mass lesion, midline shift or mass effect. No hydrocephalus or extra-axial fluid  collection. Pituitary gland and suprasellar region within normal limits. Vascular: Major intracranial vascular flow voids are maintained. Skull and upper cervical spine: Craniocervical junction normal. Bone marrow signal intensity within normal limits. No scalp soft tissue abnormality. Sinuses/Orbits: Prior bilateral ocular lens replacement. Paranasal sinuses are largely clear. No significant mastoid effusion. Other: None. IMPRESSION: 1. 2.2 cm evolving subacute ischemic infarct involving the anterior right basal ganglia. No associated hemorrhage or mass effect. 2. 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 Electronically Signed   By: Rise Mu M.D.   On: 01/24/2023 23:55   EEG adult  Result Date: 01/24/2023 Charlsie Quest, MD     01/24/2023  5:30 PM Patient Name: Monica Conway MRN: [id number] Epilepsy Attending: Charlsie Quest Referring Physician/Provider: Rocky Morel, DO Date: 01/24/2023 Duration: 26.26 mins Patient history: 77yo F with ams getting eeg to evaluate for seizure Level of alertness: Awake AEDs during EEG study: None Technical aspects: This EEG study was done with scalp electrodes positioned according to the 10-20 International system of electrode placement. Electrical activity was reviewed with band pass filter of 1-70Hz , sensitivity of 7 uV/mm, display speed of 34mm/sec with a 60Hz  notched filter applied as appropriate. EEG data were recorded continuously and digitally stored.  Video monitoring was available and reviewed as appropriate. Description: EEG showed continuous generalized 3 to 6 Hz theta-delta slowing. Generalized periodic discharges with triphasic morphology at  1-2 Hz were also noted. Hyperventilation and photic stimulation were not performed.   ABNORMALITY - Periodic discharges with triphasic morphology, generalized ( GPDs) - Continuous slow, generalized IMPRESSION: This study is suggestive of moderate  diffuse encephalopathy most likely secondary to toxic-metabolic causes like cefepime toxicity. No seizures or definite epileptiform discharges were seen throughout the recording. Charlsie Quest   DG Knee Complete 4 Views Left  Result Date: 01/24/2023 CLINICAL DATA:  Chronic left leg pain. EXAM: LEFT KNEE - COMPLETE 4+ VIEW COMPARISON:  None Available. FINDINGS: Moderate suprapatellar joint effusion is noted. No acute fracture or dislocation is noted. Moderate narrowing of medial and lateral joint spaces are noted. 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 Moderate suprapatellar joint effusion. No fracture or dislocation. Electronically Signed   By: Lupita Raider M.D.   On: 01/24/2023 12:19   DG Hip Unilat W or Wo Pelvis 2-3 Views Left  Result Date: 01/24/2023 CLINICAL DATA:  Chronic left lower extremity pain. EXAM: DG HIP (WITH OR WITHOUT PELVIS) 2-3V LEFT COMPARISON:  August 03, 2015. FINDINGS: Status post left total hip arthroplasty. Probable subacute to old fracture is seen involving the left side  of the pubic symphysis as well as the left superior pubic ramus. Right hip is unremarkable. IMPRESSION: Status post left total hip arthroplasty. Probable subacute to old fracture involving the left-sided pubic symphysis as well as left superior pubic ramus. Electronically Signed   By: Lupita Raider M.D.   On: 01/24/2023 12:18   DG Chest 1 View  Result Date: 01/24/2023 CLINICAL DATA:  Altered mental status, weakness. EXAM: CHEST  1 VIEW COMPARISON:  August 03, 2015. FINDINGS: The heart size and mediastinal contours are within normal limits. Moderate size hiatal hernia is noted. Left lung is clear. Right upper lobe nodular density is noted. The visualized skeletal structures are unremarkable. IMPRESSION: Right upper lobe nodular density is noted. CT scan of the chest with intravenous contrast is recommended for further evaluation. Electronically Signed   By: Lupita Raider M.D.   On:  01/24/2023 12:16   CT Head Wo Contrast  Result Date: 01/24/2023 CLINICAL DATA:  Mental status change with unknown cause. EXAM: CT HEAD WITHOUT CONTRAST TECHNIQUE: Contiguous axial images were obtained from the base of the skull through the vertex without intravenous contrast. RADIATION DOSE REDUCTION: This exam was performed according to the departmental dose-optimization program which includes automated exposure control, adjustment of the mA and/or kV according to patient size and/or use of iterative reconstruction technique. COMPARISON:  02/05/2014 FINDINGS: Brain: No evidence of acute infarction, hemorrhage, hydrocephalus, extra-axial collection or mass lesion/mass effect. Chronic right occipital infarct since prior. Right basal ganglia perforator infarct, likely also chronic given discrete and low-density appearance, also new from prior. Vascular: No hyperdense vessel or unexpected calcification. Skull: Normal. Negative for fracture or focal lesion. Sinuses/Orbits: Bilateral cataract resection IMPRESSION: 1. No acute finding. 2. 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 Electronically Signed   By: Tiburcio Pea M.D.   On: 01/24/2023 11:47    Assessment/Plan:   Principal Problem:   Acute encephalopathy Active Problems:   Sepsis (HCC)  #Ischemic infarct of right basal ganglia #Altered mental status MRI brain showing 2.2 cm evolving subacute ischemic infarct involving the anterior right basal ganglia. No associated hemorrhage or mass effect. 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 - Neurology consulted, no need for antiplatelets while anticoagulated - CTA head and neck pending - Echo pending - Per neuro, starting Eliquis 5mg  BID  #Sepsis #Leukocytosis  Tachycardic and hypertensive with leukocytosis to 16 ->15.4. UA and CXR negative, no obvious wounds as sources for sepsis. Still unknown source for presumed  infection - On cefepime, vanc managed by pharmacy - blood cultures x 2, urine cultures pending  #Afib with RVR Patient with history of Afib on Xarelto, has been tachycardic to 180s. Rate controlled with diltiazem infusion 125mg  - Transitioned back to home metoprolol - on Eliquis 5mg  BID  #Elevated CK Initial CK 293 -> 393. Likely in setting of acute dehydration vs crush injury after found down for <2 days.  - q12h CK levels x2 - LR infusion 12mL/hr for 24 hours  #AKI, resolved Creatinine elevated to 1.7 ->1.1, unknown baseline. Continue to monitor with daily labs as patient receives fluids and resumes po intake   #Elevated lactate Initial lactate 2.0, downtrended to 1.4 after IV LR. Likely in setting of acute dehydration. Continue to monitor.  - 176mL/hr LR infusion for 24 hours  #Hyperglycemia Blood glucose elevated to 140s. Continue to monitor with daily CBG check  #NAGMA Bicarb 15, anion gap 8. Receiving fluid infusion at 133mL/hr for  24 hours, received 1.8L so far. Continue to monitor with daily labs.   #Lung nodule Right upper lobe nodular density noted, CT chest with contrast recommended for further evaluation  #PCP follow up Patient told us her PCP (Dr Roxanne Mins) passed away recently. She has seen Dr Lollie Marrow at [facility] cardiology. It is unclear if she has a current PCP. Will ask again as her confusion further resolves.    Diet: Normal VTE: None Code: Full  Dispo: Anticipated discharge to Home pending medical stability.   [name], MD PGY-1 Internal Medicine Resident Pager Number [phone] Please contact the on call pager after 5 pm and on weekends at [phone].

## 2023-01-25 NOTE — Evaluation (Signed)
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 PMhx: Arthritis, Asthma, Lt THA, HTN   Clinical Impression   PTA, pt reports she was independent with ADL and modified independent with functional mobility at cane level. Pt received in bed, requesting to get out of bed. However, she was unable to progress past rolling due to pain in bilateral knees and requesting to stop. Pt reports pain in knees at baseline and she manages pain with tylenol. Pt oriented to self, presents with tangential thoughts and confusion. Please see cognition section for additional information. Pt requiring maxA for rolling on/off bed pan. Limited session this date secondary to pain, RN aware.  At this time recommend, Patient will benefit from continued inpatient follow up therapy, <3 hours/day. Will continue to follow acutely.      If plan is discharge home, recommend the following: A lot of help with walking and/or transfers;A lot of help with bathing/dressing/bathroom;Assistance with cooking/housework;Direct supervision/assist for medications management    Functional Status Assessment  Patient has had a recent decline in their functional status and demonstrates the ability to make significant improvements in function in a reasonable and predictable amount of time.  Equipment Recommendations  Other (comment) (tbd)    Recommendations for Other Services PT consult     Precautions / Restrictions Precautions Precautions: Fall Restrictions Weight Bearing Restrictions: No      Mobility Bed Mobility Overal bed mobility: Needs Assistance Bed Mobility: Rolling Rolling: Max assist         General bed mobility comments: maxA to  roll cues for sequencing, hand placement and physical assis    Transfers                   General transfer comment: pt limited by pain      Balance                                           ADL either performed or assessed with clinical judgement   ADL Overall ADL's : Needs assistance/impaired Eating/Feeding: Set up;Sitting   Grooming: Set up;Sitting   Upper Body Bathing: Set up;Sitting               Toilet Transfer: Maximal assistance Toilet Transfer Details (indicate cue type and reason): maxA to roll to get on/off bed pan           General ADL Comments: limited assessment secondary to pt in pain and unable to tolerate more than rolling in bed     Vision         Perception         Praxis         Pertinent Vitals/Pain Pain Assessment Pain Assessment: Faces Faces Pain Scale: Hurts worst Pain Location: BLE 10/10 with ROM able to tolerate about 10* bilateral knee flexion, pt yelling out in pain Pain Descriptors / Indicators: Moaning, Guarding Pain Intervention(s): Limited activity within patient's tolerance, Monitored during session, Repositioned, Patient requesting pain meds-RN notified     Extremity/Trunk Assessment Upper Extremity Assessment Upper Extremity Assessment: Right hand dominant;RUE deficits/detail;LUE deficits/detail RUE Deficits / Details: resting tremor, pt reports this is  not baseline;strength grossly 4/5 RUE Sensation: WNL RUE Coordination: WNL LUE Deficits / Details: strength 4/5 LUE Sensation: WNL LUE Coordination: WNL   Lower Extremity Assessment Lower Extremity Assessment: Defer to PT evaluation;RLE deficits/detail;LLE deficits/detail RLE Deficits / Details: yelling out in pain with knee flexion, able to achieve aobut 10 degrees, pt reporting feeling a lot of "pressure" bilaterally from knee to toes;pain with dorsiflexion/plantarflexion RLE Sensation: WNL LLE Deficits / Details: yelling out in pain  with knee flexion, able to achieve aobut 10 degrees, pt reporting feeling a lot of "pressure" bilaterally from knee to toes;pain with dorsiflexion/plantarflexion       Communication Communication Communication: No apparent difficulties Cueing Techniques: Verbal cues;Visual cues;Tactile cues   Cognition Arousal: Alert Behavior During Therapy: Restless, Anxious Overall Cognitive Status: Impaired/Different from baseline Area of Impairment: Orientation, Attention, Memory, Following commands, Safety/judgement, Awareness, Problem solving                 Orientation Level: Disoriented to, Time, Situation Current Attention Level: Focused Memory: Decreased recall of precautions, Decreased short-term memory Following Commands: Follows one step commands with increased time, Follows one step commands inconsistently Safety/Judgement: Decreased awareness of safety, Decreased awareness of deficits Awareness: Intellectual Problem Solving: Slow processing, Difficulty sequencing, Requires verbal cues, Requires tactile cues General Comments: pt with confusion, oriented to self (did state she was 78y.o.) and place, not oriented to time stated it was 5pm (current time was 10:30am) pt unable to count backwards, with tangential thoughts but able to redirect.     General Comments  RR 35-40 at times SpO2 100% on RA HR 80s    Exercises     Shoulder Instructions      Home Living Family/patient expects to be discharged to:: Private residence Living Arrangements: Alone Available Help at Discharge: Family;Available PRN/intermittently Type of Home: House Home Access: Stairs to enter [employer] of Steps: 1   Home Layout: One level     Bathroom Shower/Tub: Chief Strategy Officer: Standard     Home Equipment: Cane - single point          Prior Functioning/Environment Prior Level of Function : Independent/Modified Independent             Mobility Comments: pt  reports use of a cane ADLs Comments: pt reports she was independent with ADL, has family to check on her 1x/week        OT Problem List: Decreased range of motion;Decreased activity tolerance;Impaired balance (sitting and/or standing);Decreased safety awareness;Decreased cognition;Pain      OT Treatment/Interventions: Self-care/ADL training;Therapeutic exercise;DME and/or AE instruction;Patient/family education;Balance training    OT Goals(Current goals can be found in the care plan section) Acute Rehab OT Goals Patient Stated Goal: to not be in pain OT Goal Formulation: With patient Time For Goal Achievement: 02/08/23 Potential to Achieve Goals: Good ADL Goals Pt Will Perform Grooming: standing;with supervision Pt Will Perform Lower Body Dressing: with supervision;sit to/from stand Pt Will Transfer to Toilet: with supervision;ambulating Additional ADL Goal #1: Pt will demonstrate anticipatory awareness for safe engagement in ADL.  OT Frequency: Min 2X/week    Co-evaluation              AM-PAC OT "6 Clicks" Daily Activity     Outcome Measure Help from another person eating meals?: A Little Help from another person taking care of personal grooming?: A Little Help from another person toileting, which includes using toliet, bedpan, or urinal?: A Lot Help from another person bathing (including washing, rinsing,  drying)?: A Lot Help from another person to put on and taking off regular upper body clothing?: A Little Help from another person to put on and taking off regular lower body clothing?: A Lot 6 Click Score: 15   End of Session Nurse Communication: Mobility status;Patient requests pain meds  Activity Tolerance: Patient limited by pain Patient left: in bed;with call bell/phone within reach;with bed alarm set  OT Visit Diagnosis: Other abnormalities of gait and mobility (R26.89);History of falling (Z91.81);Other symptoms and signs involving cognitive function;Pain Pain -  Right/Left: Right (bilateral) Pain - part of body: Leg;Knee                Time: 1010-1034 OT Time Calculation (min): 24 min Charges:  OT General Charges $OT Visit: 1 Visit OT Evaluation $OT Eval Low Complexity: 1 Low OT Treatments $Self Care/Home Management : 8-22 mins  Rosey Bath OTR/L Acute Rehabilitation Services Office: [phone]   Providence Crosby 01/25/2023, 11:08 AM

## 2023-01-26 ENCOUNTER — Other Ambulatory Visit (HOSPITAL_COMMUNITY): Payer: Self-pay

## 2023-01-26 ENCOUNTER — Encounter (HOSPITAL_COMMUNITY): Payer: Self-pay | Admitting: Internal Medicine

## 2023-01-26 ENCOUNTER — Telehealth (HOSPITAL_COMMUNITY): Payer: Self-pay | Admitting: Pharmacy Technician

## 2023-01-26 DIAGNOSIS — E872 Acidosis, unspecified: Secondary | ICD-10-CM

## 2023-01-26 DIAGNOSIS — A419 Sepsis, unspecified organism: Secondary | ICD-10-CM

## 2023-01-26 DIAGNOSIS — I480 Paroxysmal atrial fibrillation: Secondary | ICD-10-CM

## 2023-01-26 DIAGNOSIS — I6381 Other cerebral infarction due to occlusion or stenosis of small artery: Secondary | ICD-10-CM | POA: Diagnosis not present

## 2023-01-26 HISTORY — DX: Acidosis, unspecified: E87.20

## 2023-01-26 HISTORY — DX: Other cerebral infarction due to occlusion or stenosis of small artery: I63.81

## 2023-01-26 LAB — GLUCOSE, CAPILLARY
Glucose-Capillary: 102 mg/dL — ABNORMAL HIGH (ref 70–99)
Glucose-Capillary: 113 mg/dL — ABNORMAL HIGH (ref 70–99)

## 2023-01-26 LAB — CBC WITH DIFFERENTIAL/PLATELET
Abs Immature Granulocytes: 0.14 10*3/uL — ABNORMAL HIGH (ref 0.00–0.07)
Basophils Absolute: 0.1 10*3/uL (ref 0.0–0.1)
Basophils Relative: 0 %
Eosinophils Absolute: 0.1 10*3/uL (ref 0.0–0.5)
Eosinophils Relative: 0 %
HCT: 34.5 % — ABNORMAL LOW (ref 36.0–46.0)
Hemoglobin: 10.7 g/dL — ABNORMAL LOW (ref 12.0–15.0)
Immature Granulocytes: 1 %
Lymphocytes Relative: 9 %
Lymphs Abs: 1.6 10*3/uL (ref 0.7–4.0)
MCH: 29.7 pg (ref 26.0–34.0)
MCHC: 31 g/dL (ref 30.0–36.0)
MCV: 95.8 fL (ref 80.0–100.0)
Monocytes Absolute: 2.1 10*3/uL — ABNORMAL HIGH (ref 0.1–1.0)
Monocytes Relative: 12 %
Neutro Abs: 13.2 10*3/uL — ABNORMAL HIGH (ref 1.7–7.7)
Neutrophils Relative %: 78 %
Platelets: 321 10*3/uL (ref 150–400)
RBC: 3.6 MIL/uL — ABNORMAL LOW (ref 3.87–5.11)
RDW: 16.5 % — ABNORMAL HIGH (ref 11.5–15.5)
WBC: 17.1 10*3/uL — ABNORMAL HIGH (ref 4.0–10.5)
nRBC: 0.6 % — ABNORMAL HIGH (ref 0.0–0.2)

## 2023-01-26 LAB — CK
Total CK: 446 U/L — ABNORMAL HIGH (ref 38–234)
Total CK: 416 U/L — ABNORMAL HIGH (ref 38–234)
Total CK: 387 U/L — ABNORMAL HIGH (ref 38–234)

## 2023-01-26 LAB — BASIC METABOLIC PANEL
Anion gap: 10 (ref 5–15)
BUN: 14 mg/dL (ref 8–23)
CO2: 17 mmol/L — ABNORMAL LOW (ref 22–32)
Calcium: 8.7 mg/dL — ABNORMAL LOW (ref 8.9–10.3)
Chloride: 114 mmol/L — ABNORMAL HIGH (ref 98–111)
Creatinine, Ser: 0.97 mg/dL (ref 0.44–1.00)
GFR, Estimated: >60 mL/min (ref >60)
Glucose, Bld: 104 mg/dL — ABNORMAL HIGH (ref 70–99)
Potassium: 2.9 mmol/L — ABNORMAL LOW (ref 3.5–5.1)
Sodium: 141 mmol/L (ref 135–145)

## 2023-01-26 LAB — MAGNESIUM: Magnesium: 2 mg/dL (ref 1.7–2.4)

## 2023-01-26 LAB — CHLORIDE, URINE, RANDOM: Chloride Urine: 119 mmol/L

## 2023-01-26 MED ORDER — MELATONIN 5 MG PO TABS
5.0000 mg | ORAL_TABLET | Freq: Every day | ORAL | Status: DC
  Administered 2023-01-26 – 2023-01-30 (×5): 5 mg via ORAL
  Filled 2023-01-26 (×6): qty 1

## 2023-01-26 MED ORDER — METOPROLOL TARTRATE 12.5 MG HALF TABLET
12.5000 mg | ORAL_TABLET | Freq: Three times a day (TID) | ORAL | Status: DC
  Administered 2023-01-26 – 2023-01-27 (×3): 12.5 mg via ORAL
  Filled 2023-01-26 (×3): qty 1

## 2023-01-26 MED ORDER — METOPROLOL TARTRATE 5 MG/5ML IV SOLN
5.0000 mg | INTRAVENOUS | Status: AC | PRN
Start: 2023-01-26 — End: 2023-01-26
  Administered 2023-01-26: 5 mg via INTRAVENOUS
  Filled 2023-01-26: qty 5

## 2023-01-26 MED ORDER — POTASSIUM CHLORIDE CRYS ER 20 MEQ PO TBCR
20.0000 meq | EXTENDED_RELEASE_TABLET | Freq: Four times a day (QID) | ORAL | Status: AC
  Administered 2023-01-26 (×2): 20 meq via ORAL
  Filled 2023-01-26 (×2): qty 1

## 2023-01-26 NOTE — Progress Notes (Signed)
Received a page from RN regarding patient's confusion/pulling out IV. Checked patient at bedside who was calmly resting, A&O to person and time, but with persistent confusion regarding place/situation. Spent time re-orienting patient and melatonin ordered. Holding off on replacing IV at this time. AM team will be notified.

## 2023-01-26 NOTE — Progress Notes (Signed)
Patient refused morning scheduled Tylenol. She said she was cold and wants to go to the "living room" where it is warmer. Attempted to reorient patient again to her location and situation without success. Increased temperature in room and provided warm blanket.

## 2023-01-26 NOTE — Progress Notes (Signed)
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 PMhx: Arthritis, Asthma, Lt THA, HTN   OT comments  Pt with minimal progress towards OT goals due to confusion and anxiety related to movement. Pt initially agreeable for OOB attempts but when time to initiate, pt resistant and reported need to "sit down first". Despite reorientation to being supine in bed multiple times, pt unable to progress EOB. Noted linens soiled, required Total A for cleanup and Total A x 1-2 to roll in bed due to resistance. Patient will benefit from continued inpatient follow up therapy, <3 hours/day at DC as pt is significantly below reported baseline.      If plan is discharge home, recommend the following:  A lot of help with walking and/or transfers;A lot of help with bathing/dressing/bathroom;Assistance with cooking/housework;Direct supervision/assist for medications management;Two people to help with walking and/or transfers;Two people to help with bathing/dressing/bathroom   Equipment Recommendations  Other (comment) (TBD)    Recommendations for Other Services      Precautions / Restrictions Precautions Precautions: Fall Restrictions Weight Bearing Restrictions: No       Mobility Bed Mobility Overal bed mobility: Needs Assistance Bed Mobility: Rolling Rolling: Total assist         General bed mobility comments: pt requesting to hold therapist's hand for bed mobility initiation but would not let go of bedrail with other hand and would not allow OT to assist with LE to EOB (often saying 'wait a minute. I need to sit down"). Unable to get EOB. Total A x 1-2 for rolling for peri care and linen change due to pt  resistance    Transfers                         Balance                                           ADL either performed or assessed with clinical judgement   ADL Overall ADL's : Needs assistance/impaired                             Toileting- Clothing Manipulation and Hygiene: Total assistance;Bed level Toileting - Clothing Manipulation Details (indicate cue type and reason): urine incontinence and purewick malfunction. Total A for cleanup + RN and NT in to assist as well            Extremity/Trunk Assessment Upper Extremity Assessment Upper Extremity Assessment: Generalized weakness;Right hand dominant;Difficult to assess due to impaired cognition   Lower Extremity Assessment Lower Extremity Assessment: Defer to PT evaluation        Vision   Vision Assessment?: No apparent visual deficits   Perception     Praxis      Cognition Arousal: Alert Behavior During Therapy: Restless, Anxious Overall Cognitive Status: Impaired/Different from baseline Area of Impairment: Attention, Memory, Following commands, Safety/judgement, Awareness, Problem solving, Orientation                 Orientation Level: Disoriented to, Time, Situation Current Attention Level: Focused Memory: Decreased recall of precautions,  Decreased short-term memory Following Commands: Follows one step commands with increased time, Follows one step commands inconsistently Safety/Judgement: Decreased awareness of safety, Decreased awareness of deficits Awareness: Intellectual Problem Solving: Slow processing, Difficulty sequencing, Requires verbal cues, Requires tactile cues, Decreased initiation General Comments: Initially pleasant, agreeable for OOB and movement but when time to initiate movement, pt very anxious and resistant. reports she has been standing up all morning and needs to sit down - reoriented multiple times that she was supine in bed but said "I  wish you would stop saying that". difficult to engage due to confusion and anxiety        Exercises      Shoulder Instructions       General Comments      Pertinent Vitals/ Pain       Pain Assessment Pain Assessment: Faces Faces Pain Scale: Hurts a little bit Pain Location: knees Pain Descriptors / Indicators: Grimacing, Guarding, Sore Pain Intervention(s): Monitored during session, Limited activity within patient's tolerance  Home Living                                          Prior Functioning/Environment              Frequency  Min 1X/week        Progress Toward Goals  OT Goals(current goals can now be found in the care plan section)  Progress towards OT goals: Not progressing toward goals - comment  Acute Rehab OT Goals Patient Stated Goal: go to rehab tuesday OT Goal Formulation: With patient Time For Goal Achievement: 02/08/23 Potential to Achieve Goals: Good ADL Goals Pt Will Perform Grooming: standing;with supervision Pt Will Perform Lower Body Dressing: with supervision;sit to/from stand Pt Will Transfer to Toilet: with supervision;ambulating Additional ADL Goal #1: Pt will demonstrate anticipatory awareness for safe engagement in ADL.  Plan      Co-evaluation                 AM-PAC OT "6 Clicks" Daily Activity     Outcome Measure   Help from another person eating meals?: A Little Help from another person taking care of personal grooming?: A Little Help from another person toileting, which includes using toliet, bedpan, or urinal?: Total Help from another person bathing (including washing, rinsing, drying)?: A Lot Help from another person to put on and taking off regular upper body clothing?: A Little Help from another person to put on and taking off regular lower body clothing?: A Lot 6 Click Score: 14    End of Session    OT Visit Diagnosis: Other abnormalities of gait and mobility (R26.89);History of falling  (Z91.81);Other symptoms and signs involving cognitive function;Pain   Activity Tolerance Other (comment) (limited by cognition)   Patient Left in bed;with call bell/phone within reach;with bed alarm set   Nurse Communication Mobility status        Time: 9562-1308 OT Time Calculation (min): 32 min  Charges: OT General Charges $OT Visit: 1 Visit OT Treatments $Self Care/Home Management : 8-22 mins $Therapeutic Activity: 8-22 mins  Bradd Canary, OTR/L Acute Rehab Services Office: [phone]   Lorre Munroe 01/26/2023, 8:47 AM

## 2023-01-26 NOTE — Progress Notes (Signed)
Pts HR sustaining in 150s as high as 170s Dr. Geraldo Pitter and Dr. Carlynn Purl at bedside, pt given scheduled Metoprolol succinate 25 mg @0900  , new order placed for PRN lopressor 5mg  IV given at 0935.   Dr. Geraldo Pitter notified at 1009 Pts Hr still sustaining in 140s-150s, Pt given scheduled Lopressor 12.5 mg po.

## 2023-01-26 NOTE — Telephone Encounter (Signed)
Patient Product/process development scientist completed.    The patient is insured through [employer]. Patient has Medicare and is not eligible for a copay card, but may be able to apply for patient assistance, if available.    Ran test claim for Eliquis 5 mg and the current 30 day co-pay is $139.76 due to being in Coverage Gap (donut hole).   This test claim was processed through [facility]- copay amounts may vary at other pharmacies due to pharmacy/plan contracts, or as the patient moves through the different stages of their insurance plan.     Roland Earl, CPHT Pharmacy Technician III Certified Patient Advocate [facility] Patient Advocate Team Direct Number: [phone]  Fax: [phone]

## 2023-01-26 NOTE — Progress Notes (Signed)
Speech Language Pathology Treatment: Dysphagia  Patient Details Name: Monica Conway MRN: [id number] DOB: [birth year] Today's Date: 01/26/2023 Time: 7829-5621 SLP Time Calculation (min) (ACUTE ONLY): 6 min  Assessment / Plan / Recommendation Clinical Impression  Pt seen for ongoing dysphagia management.  Delirium/AMS remains a barrier to diet advancement.  Pt awake/alert, but refused most PO trials and was extremely difficult to redirect.  Pt was unable to siphon water from straw and refused all other attempts to get her to drink.  She accepted a small amount of puree x1 and a bite of soft fruit by spoon x1 before refusing other PO trials.  Oral clearance was adequate.  There were no clinical s/s of aspiration.    Pt's significant confusion was a barrier to being able to participate in any cognitive-linguistic tasks today.  Attempts to engage her in therapeutic tasks or further PO trials increased agitation.  Recommend continuing chopped/ground texture diet with thin liquids.     HPI HPI: Patient is a 77 y.o. female with PMH: a-fib, HTN, asthma, arthritis, osteoporosis. She was found down and encephalopathic by family after they went to check on her. She arrived at the ED via EMS on 01/24/23. BP was 190/89. 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      SLP Plan  Continue with current plan of care      Recommendations for follow up therapy are one component of a multi-disciplinary discharge planning process, led by the attending physician.  Recommendations may be updated based on patient status, additional functional criteria and insurance authorization.    Recommendations  Diet recommendations: Dysphagia 2 (fine chop);Thin liquid Liquids provided via: Cup Medication Administration: Whole meds with  puree (crush as needed) Compensations: Slow rate;Small sips/bites;Minimize environmental distractions Postural Changes and/or Swallow Maneuvers: Seated upright 90 degrees                  Oral care BID   Frequent or constant Supervision/Assistance Dysphagia, unspecified (R13.10)     Continue with current plan of care     Kerrie Pleasure, MA, CCC-SLP Acute Rehabilitation Services Office: [phone] 01/26/2023, 10:42 AM

## 2023-01-26 NOTE — Progress Notes (Signed)
   01/26/23 1200  Assess: MEWS Score  Temp 98.8 F (37.1 C)  BP 121/88  MAP (mmHg) 99  ECG Heart Rate (!) 154  Resp (!) 21  Level of Consciousness Alert  Assess: MEWS Score  MEWS Temp 0  MEWS Systolic 0  MEWS Pulse 3  MEWS RR 1  MEWS LOC 0  MEWS Score 4  MEWS Score Color Red  Assess: if the MEWS score is Yellow or Red  Were vital signs accurate and taken at a resting state? Yes  Does the patient meet 2 or more of the SIRS criteria? Yes  Does the patient have a confirmed or suspected source of infection? No  MEWS guidelines implemented  Yes, red  Treat  MEWS Interventions Considered administering scheduled or prn medications/treatments as ordered  Take Vital Signs  Increase Vital Sign Frequency  Red: Q1hr x2, continue Q4hrs until patient remains green for 12hrs  Escalate  MEWS: Escalate Red: Discuss with charge nurse and notify provider. Consider notifying RRT. If remains red for 2 hours consider need for higher level of care  Provider Notification  Provider Name/Title Dr. Geraldo Pitter  Date Provider Notified 01/26/23  Time Provider Notified 1100  Method of Notification Page  Notification Reason Change in status  Provider response Other (Comment) (restarted Cardizem gtt)  Date of Provider Response 01/26/23  Time of Provider Response 1102  Assess: SIRS CRITERIA  SIRS Temperature  0  SIRS Pulse 1  SIRS Respirations  1  SIRS WBC 1  SIRS Score Sum  3   IMTS team aware pts HR is still elevated. Dr. Geraldo Pitter gave verbal order to restart Cardizem gtt

## 2023-01-26 NOTE — Progress Notes (Addendum)
Subjective:   Summary: Monica Conway is a 77 y.o. person living with a history of Afib, HTN, osteoporosis who was brought in by EMS found down and encephalopathic, found to have subacute stroke of R basal ganglia.   Hospital Day 2  Overnight Events: Pt was agitated and confused, possibly hallucinating, pulled out her IV. Redirectable, did not require pharmaceutical intervention  Subjective:  Patient is alert and oriented this morning, still with some mild confusion. She reports needing to use the bathroom often with multiple episodes of diarrhea and urination. She is tolerating a normal diet without nausea, vomiting, or abdominal pain.   Saw later in the day to reevaluate and confirm home medications. Patient was hallucinating animals on the roof, and perseverating on going home today to pay bills. Mildly agitated but redirectable. Confirmed her home med list, but unsure of how reliable this is at the moment.   Objective:  Vital signs in last 24 hours: Vitals:   01/26/23 0500 01/26/23 0837 01/26/23 0926 01/26/23 0934  BP:  (!) 176/90 109/76 111/87  Pulse:  97    Resp:  20    Temp:  98.2 F (36.8 C)    TempSrc:  Oral    SpO2:      Weight: 53.1 kg     Height:       Supplemental O2: Room Air SpO2: 92 %  Physical Exam:  Constitutional: in no acute distress Cardiovascular: 2/6 systolic murmur. Tachycardic Pulmonary/Chest: normal work of breathing on room air, lungs clear to auscultation bilaterally Abdominal: soft, non-tender, non-distended Neuro: A&O x 4, encephalopathy greatly improved. 5/5 strength, no focal deficit noted. Leg strength limited by hip pain Skin: warm and dry Extremities: upper/lower extremity pulses 2+, no lower extremity edema present   Intake/Output Summary (Last 24 hours) at 01/26/2023 1011 Last data filed at 01/26/2023 0255 Gross per 24 hour  Intake 2350.63 ml  Output 600 ml  Net 1750.63 ml   Net IO Since Admission: 2,726.95 mL  [01/26/23 1011]  Pertinent Labs:    Latest Ref Rng & Units 01/26/2023    4:32 AM 01/25/2023    3:41 AM 01/24/2023   10:37 AM  CBC  WBC 4.0 - 10.5 K/uL 17.1  15.4    Hemoglobin 12.0 - 15.0 g/dL 40.9  9.6  81.1   Hematocrit 36.0 - 46.0 % 34.5  31.2  39.0   Platelets 150 - 400 K/uL 321  356         Latest Ref Rng & Units 01/26/2023    4:32 AM 01/25/2023    3:41 AM 01/24/2023   10:37 AM  CMP  Glucose 70 - 99 mg/dL 914  782  956   BUN 8 - 23 mg/dL 14  21  36   Creatinine 0.44 - 1.00 mg/dL 2.13  0.86  5.78   Sodium 135 - 145 mmol/L 141  145  148   Potassium 3.5 - 5.1 mmol/L 2.9  2.9  3.7   Chloride 98 - 111 mmol/L 114  122  121   CO2 22 - 32 mmol/L 17  15    Calcium 8.9 - 10.3 mg/dL 8.7  8.9      Imaging: CT ANGIO HEAD NECK W WO CM  Result Date: 01/25/2023 CLINICAL DATA:  Follow-up examination for stroke. EXAM: CT ANGIOGRAPHY HEAD AND NECK WITH AND WITHOUT CONTRAST TECHNIQUE: Multidetector CT imaging of the head and  neck was performed using the standard protocol during bolus administration of intravenous contrast. Multiplanar CT image reconstructions and MIPs were obtained to evaluate the vascular anatomy. Carotid stenosis measurements (when applicable) are obtained utilizing NASCET criteria, using the distal internal carotid diameter as the denominator. RADIATION DOSE REDUCTION: This exam was performed according to the departmental dose-optimization program which includes automated exposure control, adjustment of the mA and/or kV according to patient size and/or use of iterative reconstruction technique. CONTRAST:  75mL OMNIPAQUE IOHEXOL 350 MG/ML SOLN COMPARISON:  MRI from 01/24/2023. FINDINGS: CT HEAD FINDINGS Brain: Cerebral volume within normal limits. Mild chronic microvascular ischemic disease. Chronic right PCA distribution infarct. Previously identified evolving right basal ganglia subacute infarct again noted, relatively stable from prior. No acute intracranial hemorrhage. No other  acute large vessel territory infarct. No mass lesion or midline shift. No hydrocephalus or extra-axial fluid collection. Vascular: No abnormal hyperdense vessel. Skull: Scalp soft tissues within normal limits.  Calvarium intact. Sinuses/Orbits: Globes orbital soft tissues within normal limits. Paranasal sinuses are largely clear. No mastoid effusion. Other: None. Review of the MIP images confirms the above findings CTA NECK FINDINGS Aortic arch: Visualized aortic arch within normal limits for caliber with standard 3 vessel morphology. Mild aortic atherosclerosis. No stenosis about the origin the great vessels. Right carotid system: Right common and internal carotid arteries are tortuous but patent without dissection. Minimal atheromatous change about the right carotid bulb without stenosis. Left carotid system: Left common and internal carotid arteries are tortuous but patent without dissection. Minimal atheromatous change about the left carotid bulb without stenosis. Vertebral arteries: Both vertebral arteries arise from the subclavian arteries. 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 Vertebral arteries otherwise patent without stenosis or dissection. Skeleton: No discrete or worrisome osseous lesions. 4 mm facet mediated anterolisthesis of C6 on C7, with trace anterolisthesis of C7 on T1. Other neck: No other acute finding. Asymmetric atrophy of the right parotid gland noted. Upper chest: No other acute finding. Review of the MIP images confirms the above findings CTA HEAD FINDINGS Anterior circulation: Mild atheromatous change about the carotid siphons without stenosis. A1 segments, anterior communicating artery complex common anterior cerebral arteries patent without stenosis. No M1 stenosis or occlusion. Distal MCA branches perfused and symmetric. Posterior circulation: Both V4 segments patent without stenosis. Both PICA patent. Basilar patent without stenosis.  Superior cerebral arteries patent bilaterally. Right PCA supplied via the basilar. Fetal type origin of the left PCA. Both PCAs patent distal aspects without proximal stenosis. Venous sinuses: Not well assessed due to timing the contrast bolus. Anatomic variants: As above.  No aneurysm. Review of the MIP images confirms the above findings IMPRESSION: CT HEAD: 1. Stable evolving subacute right basal ganglia infarct. No new acute intracranial abnormality. 2. Chronic right PCA distribution infarct. 3. Mild chronic microvascular ischemic disease. CTA HEAD AND NECK: 1. Negative CTA for large vessel occlusion or other emergent finding. 2. 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 3. Mild for age atheromatous change elsewhere about the carotid bifurcations and carotid siphons without hemodynamically significant or correctable stenosis. 4. Diffuse tortuosity of the major arterial vasculature of the head and neck, suggesting chronic underlying hypertension. 5.  Aortic Atherosclerosis (ICD10-I70.0). Electronically Signed   By: Rise Mu M.D.   On: 01/25/2023 20:21   ECHOCARDIOGRAM COMPLETE BUBBLE STUDY  Result Date: 01/25/2023    ECHOCARDIOGRAM REPORT   Patient Name:   CARAN MILLSON Date of  Exam: 01/25/2023 Medical Rec #:  [id number]    Height:       62.0 in Accession #:    [id number]   Weight:       111.8 lb Date of Birth:  09-24-1945    BSA:          1.493 m Patient Age:    77 years     BP:           160/64 mmHg Patient Gender: F            HR:           74 bpm. Exam Location:  Inpatient Procedure: 2D Echo, Color Doppler and Cardiac Doppler Indications:    Stroke  History:        Patient has no prior history of Echocardiogram examinations.                 Arrythmias:Atrial Fibrillation; Risk Factors:Hypertension.  Sonographer:    Raeford Razor RDCS Referring Phys: [phone] EMILY B MULLEN  Sonographer Comments: Image acquisition challenging due to patient body habitus.  IMPRESSIONS  1. Left ventricular ejection fraction, by estimation, is 55 to 60%. The left ventricle has normal function. The left ventricle has no regional wall motion abnormalities. Left ventricular diastolic parameters are consistent with Grade I diastolic dysfunction (impaired relaxation).  2. Right ventricular systolic function is normal. The right ventricular size is normal. Tricuspid regurgitation signal is inadequate for assessing PA pressure.  3. The mitral valve is grossly normal. Trivial mitral valve regurgitation. No evidence of mitral stenosis.  4. The aortic valve was not well visualized. There is mild calcification of the aortic valve. There is mild thickening of the aortic valve. Aortic valve regurgitation is not visualized. Mild to moderate aortic valve stenosis. Aortic valve area, by VTI measures 1.23 cm. Aortic valve mean gradient measures 18.4 mmHg. Aortic valve Vmax measures 2.80 m/s.  5. The inferior vena cava is normal in size with greater than 50% respiratory variability, suggesting right atrial pressure of 3 mmHg.  6. Agitated saline contrast bubble study was negative, with no evidence of any interatrial shunt. Conclusion(s)/Recommendation(s): No intracardiac source of embolism detected on this transthoracic study. Consider a transesophageal echocardiogram to exclude cardiac source of embolism if clinically indicated. FINDINGS  Left Ventricle: Left ventricular ejection fraction, by estimation, is 55 to 60%. The left ventricle has normal function. The left ventricle has no regional wall motion abnormalities. The left ventricular internal cavity size was normal in size. There is  no left ventricular hypertrophy. Left ventricular diastolic parameters are consistent with Grade I diastolic dysfunction (impaired relaxation). Right Ventricle: The right ventricular size is normal. No increase in right ventricular wall thickness. Right ventricular systolic function is normal. Tricuspid regurgitation  signal is inadequate for assessing PA pressure. Left Atrium: Left atrial size was normal in size. Right Atrium: Right atrial size was normal in size. Pericardium: There is no evidence of pericardial effusion. Mitral Valve: The mitral valve is grossly normal. Trivial mitral valve regurgitation. No evidence of mitral valve stenosis. Tricuspid Valve: The tricuspid valve is grossly normal. Tricuspid valve regurgitation is trivial. No evidence of tricuspid stenosis. Aortic Valve: The aortic valve was not well visualized. There is mild calcification of the aortic valve. There is mild thickening of the aortic valve. Aortic valve regurgitation is not visualized. Mild to moderate aortic stenosis is present. Aortic valve  mean gradient measures 18.4 mmHg. Aortic valve peak gradient measures 31.4 mmHg. Aortic valve area, by VTI measures  1.23 cm. Pulmonic Valve: The pulmonic valve was grossly normal. Pulmonic valve regurgitation is not visualized. No evidence of pulmonic stenosis. Aorta: The aortic root is normal in size and structure. Venous: The inferior vena cava is normal in size with greater than 50% respiratory variability, suggesting right atrial pressure of 3 mmHg. IAS/Shunts: The atrial septum is grossly normal. Agitated saline contrast was given intravenously to evaluate for intracardiac shunting. Agitated saline contrast bubble study was negative, with no evidence of any interatrial shunt.  LEFT VENTRICLE PLAX 2D LVIDd:         4.30 cm     Diastology LVIDs:         2.80 cm     LV e' medial:    3.15 cm/s LV PW:         0.80 cm     LV E/e' medial:  22.9 LV IVS:        0.90 cm     LV e' lateral:   5.26 cm/s LVOT diam:     2.20 cm     LV E/e' lateral: 13.7 LV SV:         73 LV SV Index:   49 LVOT Area:     3.80 cm  LV Volumes (MOD) LV vol d, MOD A2C: 72.0 ml LV vol d, MOD A4C: 79.8 ml LV vol s, MOD A2C: 27.0 ml LV vol s, MOD A4C: 27.0 ml LV SV MOD A2C:     45.0 ml LV SV MOD A4C:     79.8 ml LV SV MOD BP:      50.5 ml  RIGHT VENTRICLE             IVC RV Basal diam:  3.30 cm     IVC diam: 1.70 cm RV Mid diam:    1.90 cm RV S prime:     11.20 cm/s TAPSE (M-mode): 2.0 cm LEFT ATRIUM             Index        RIGHT ATRIUM           Index LA diam:        3.40 cm 2.28 cm/m   RA Area:     13.60 cm LA Vol (A2C):   54.1 ml 36.24 ml/m  RA Volume:   33.00 ml  22.11 ml/m LA Vol (A4C):   46.9 ml 31.42 ml/m LA Biplane Vol: 52.2 ml 34.97 ml/m  AORTIC VALVE AV Area (Vmax):    1.25 cm AV Area (Vmean):   1.17 cm AV Area (VTI):     1.23 cm AV Vmax:           280.38 cm/s AV Vmean:          202.746 cm/s AV VTI:            0.594 m AV Peak Grad:      31.4 mmHg AV Mean Grad:      18.4 mmHg LVOT Vmax:         92.00 cm/s LVOT Vmean:        62.200 cm/s LVOT VTI:          0.192 m LVOT/AV VTI ratio: 0.32  AORTA Ao Root diam: 2.90 cm MITRAL VALVE MV Area (PHT): 3.60 cm    SHUNTS MV Decel Time: 211 msec    Systemic VTI:  0.19 m MV E velocity: 72.20 cm/s  Systemic Diam: 2.20 cm MV A velocity: 98.50 cm/s MV E/A ratio:  0.73  Lennie Odor MD Electronically signed by Lennie Odor MD Signature Date/Time: 01/25/2023/2:54:18 PM    Final     Assessment/Plan:   Principal Problem:   Acute encephalopathy Active Problems:   Sepsis (HCC)  #Ischemic infarct of right basal ganglia #Altered mental status MRI brain showing 2.2 cm evolving subacute ischemic infarct involving the anterior right basal ganglia. No associated hemorrhage or mass effect. 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 CTA negative for large vessel occlusion. Echo without intracardiac source for embolism.  - Neurology consulted, no need for antiplatelets while anticoagulated - On Eliquis 5mg  BID  #Sepsis #Leukocytosis  Tachycardic and hypertensive with leukocytosis to 16 ->15.4 -> 17.1. UA and CXR negative, no obvious wounds as sources for sepsis. Still unknown source for presumed infection - On zosyn, day 3 - blood  cultures NGTD  #Afib with RVR Patient with history of Afib on Xarelto, has been tachycardic to 180s. Rate initially controlled with diltiazem infusion, transitioned to home metoprolol succinate 25mg .  - Persistently tachycardic to [facility] 160s, restarted in diltiazem drip - on Eliquis 5mg  BID  #Elevated CK Initial CK 293 -> 393 -> 446. Likely in setting of acute dehydration vs crush injury after found down for <2 days.  - q12h CK levels x2 - IV fluid infusion stopped, encourage po intake  #NAGMA Bicarb 15 ->17, anion gap 8 ->10. Positive urinary anion gap of 10. 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 Patient reporting multiple episodes of diarrhea. Continue to monitor with daily labs. Replete electrolytes as needed  #AKI, resolved Creatinine elevated to 1.7 ->1.1 -> 0.9, unknown baseline. Continue to monitor with daily labs as patient receives fluids and resumes po intake   #Elevated lactate, resolved Initial lactate 2.0, downtrended to 1.4 after IV LR. Likely in setting of acute dehydration.  #Lung nodule Right upper lobe nodular density noted, CT chest with contrast recommended for further evaluation  #PCP follow up Patient told us her PCP (Dr Roxanne Mins) passed away recently. She has seen Dr Lollie Marrow at [facility] cardiology. It is unclear if she has a current PCP. Will ask again as her confusion further resolves.    Diet: Normal VTE: None Code: Full  Dispo: Anticipated discharge to Home pending medical stability.   [name], MD PGY-1 Internal Medicine Resident Pager Number [phone] Please contact the on call pager after 5 pm and on weekends at [phone].

## 2023-01-26 NOTE — Progress Notes (Signed)
MD Annie Paras paged again due to patient having increased confusion. Pt has awakened and pulled out another IV. Patient is not oriented to place. She refuses to believe she is in the hospital. She thinks she is at a house and says we are holding her against her will. Patient able to state her name, the year, month and date. However, unable to orient patient to place and situation. MD came to see patient and agreed that we can leave IV out until dayshift. Order for IV team restart cancelled at this time. MD is going to place order for medication to help patient sleep. Will continue to use bed alarm for patient safety.

## 2023-01-26 NOTE — Progress Notes (Signed)
Patient offered 5 mg melatonin. She refused to take medication. She said she is not taking any medicine to help her sleep unless she has to have surgery.

## 2023-01-27 DIAGNOSIS — I480 Paroxysmal atrial fibrillation: Secondary | ICD-10-CM | POA: Diagnosis not present

## 2023-01-27 DIAGNOSIS — G9340 Encephalopathy, unspecified: Secondary | ICD-10-CM | POA: Diagnosis not present

## 2023-01-27 LAB — BASIC METABOLIC PANEL
Anion gap: 9 (ref 5–15)
BUN: 13 mg/dL (ref 8–23)
CO2: 18 mmol/L — ABNORMAL LOW (ref 22–32)
Calcium: 8.4 mg/dL — ABNORMAL LOW (ref 8.9–10.3)
Chloride: 114 mmol/L — ABNORMAL HIGH (ref 98–111)
Creatinine, Ser: 1.1 mg/dL — ABNORMAL HIGH (ref 0.44–1.00)
GFR, Estimated: 52 mL/min — ABNORMAL LOW (ref >60)
Glucose, Bld: 109 mg/dL — ABNORMAL HIGH (ref 70–99)
Potassium: 3.5 mmol/L (ref 3.5–5.1)
Sodium: 141 mmol/L (ref 135–145)

## 2023-01-27 LAB — BLOOD GAS, VENOUS
Acid-base deficit: 2.9 mmol/L — ABNORMAL HIGH (ref 0.0–2.0)
Bicarbonate: 20.9 mmol/L (ref 20.0–28.0)
O2 Saturation: 63.9 %
Patient temperature: 37.1
pCO2, Ven: 33 mm[Hg] — ABNORMAL LOW (ref 44–60)
pH, Ven: 7.41 (ref 7.25–7.43)
pO2, Ven: 38 mm[Hg] (ref 32–45)

## 2023-01-27 LAB — CBC
HCT: 32 % — ABNORMAL LOW (ref 36.0–46.0)
Hemoglobin: 10.2 g/dL — ABNORMAL LOW (ref 12.0–15.0)
MCH: 29.7 pg (ref 26.0–34.0)
MCHC: 31.9 g/dL (ref 30.0–36.0)
MCV: 93.3 fL (ref 80.0–100.0)
Platelets: 340 10*3/uL (ref 150–400)
RBC: 3.43 MIL/uL — ABNORMAL LOW (ref 3.87–5.11)
RDW: 16.5 % — ABNORMAL HIGH (ref 11.5–15.5)
WBC: 16.7 10*3/uL — ABNORMAL HIGH (ref 4.0–10.5)
nRBC: 0.4 % — ABNORMAL HIGH (ref 0.0–0.2)

## 2023-01-27 LAB — GLUCOSE, CAPILLARY: Glucose-Capillary: 106 mg/dL — ABNORMAL HIGH (ref 70–99)

## 2023-01-27 MED ORDER — METOPROLOL TARTRATE 25 MG PO TABS
25.0000 mg | ORAL_TABLET | Freq: Three times a day (TID) | ORAL | Status: DC
  Administered 2023-01-27 (×3): 25 mg via ORAL
  Filled 2023-01-27 (×3): qty 1

## 2023-01-27 NOTE — Progress Notes (Signed)
Physical Therapy Treatment Patient Details Name: Monica Conway MRN: [id number] DOB: Jan 20, 1946 Today's Date: 01/27/2023   History of Present Illness 77 yo female admitted 11/30 after found confused at home with hypokalemia, new Afib and MRI showed a 2.2 cm evolving subacute ischemic infarct involving the anterior right basal ganglia. PMhx: Arthritis, Asthma, Lt THA, HTN    PT Comments  Pt pleasant and oriented today but lacks awareness of deficits, assisted needed and lack of mobility. Pt requires max assist to move in bed and max +2 for stand pivot to chair, unable to maintain balance without assist and could not step or process sequence to move feet for pivot. Pt continues to need significant assist and will benefit from continued inpatient follow up therapy, <3 hours/day  HR 64-73 BP 116/62 (78) sitting      If plan is discharge home, recommend the following: Two people to help with walking and/or transfers;Two people to help with bathing/dressing/bathroom;Assistance with cooking/housework;Assistance with feeding;Direct supervision/assist for medications management;Assist for transportation;Help with stairs or ramp for entrance;Supervision due to cognitive status   Can travel by private vehicle     No  Equipment Recommendations  Hospital bed;Hoyer lift;Wheelchair (measurements PT)    Recommendations for Other Services       Precautions / Restrictions Precautions Precautions: Fall Restrictions Weight Bearing Restrictions: No     Mobility  Bed Mobility Overal bed mobility: Needs Assistance Bed Mobility: Supine to Sit     Supine to sit: Max assist, Used rails     General bed mobility comments: max assist to pivot to EOB and elevate trunk with max multimodal cues, physical assist to bring trunk forward and balance EOB.    Transfers Overall transfer level: Needs assistance   Transfers: Sit to/from Stand, Bed to chair/wheelchair/BSC Sit to Stand: Mod assist, +2 physical  assistance Stand pivot transfers: Max assist, +2 physical assistance         General transfer comment: mod +2 to stand from EOB x 2 trials with RW present, cues for hand placement and sequence with physical assist to rise. Pt able to slightly move feet in standing to pivot with max assist to position RW and ultimately max assist to pivot hips to chair. Total +2 to scoot back in chair    Ambulation/Gait               General Gait Details: unable   Stairs             Wheelchair Mobility     Tilt Bed    Modified Rankin (Stroke Patients Only) Modified Rankin (Stroke Patients Only) Pre-Morbid Rankin Score: No symptoms Modified Rankin: Severe disability     Balance Overall balance assessment: Needs assistance Sitting-balance support: Feet supported, Feet unsupported, Single extremity supported Sitting balance-Leahy Scale: Poor Sitting balance - Comments: use of rail for sitting balance   Standing balance support: Bilateral upper extremity supported, Reliant on assistive device for balance, During functional activity Standing balance-Leahy Scale: Poor Standing balance comment: UB support on RW with assist of therapist                            Cognition Arousal: Alert Behavior During Therapy: WFL for tasks assessed/performed Overall Cognitive Status: Impaired/Different from baseline Area of Impairment: Attention, Memory, Following commands, Safety/judgement, Awareness, Problem solving                   Current Attention Level: Sustained Memory: Decreased  short-term memory Following Commands: Follows one step commands inconsistently, Follows one step commands with increased time Safety/Judgement: Decreased awareness of deficits   Problem Solving: Slow processing, Requires verbal cues, Requires tactile cues General Comments: Pt A&O x 4, however states that she stood up this morning and ate but did not. Pt also with no awareness of deficits  requiring significant physical for all mobility        Exercises      General Comments        Pertinent Vitals/Pain Pain Assessment Pain Score: 4  Pain Location: bil knees and hips Pain Descriptors / Indicators: Grimacing, Guarding, Sore Pain Intervention(s): Limited activity within patient's tolerance, Repositioned, Monitored during session    Home Living                          Prior Function            PT Goals (current goals can now be found in the care plan section) Progress towards PT goals: Progressing toward goals    Frequency    Min 1X/week      PT Plan      Co-evaluation              AM-PAC PT "6 Clicks" Mobility   Outcome Measure  Help needed turning from your back to your side while in a flat bed without using bedrails?: A Lot Help needed moving from lying on your back to sitting on the side of a flat bed without using bedrails?: Total Help needed moving to and from a bed to a chair (including a wheelchair)?: Total Help needed standing up from a chair using your arms (e.g., wheelchair or bedside chair)?: Total Help needed to walk in hospital room?: Total Help needed climbing 3-5 steps with a railing? : Total 6 Click Score: 7    End of Session Equipment Utilized During Treatment: Gait belt Activity Tolerance: Patient tolerated treatment well Patient left: in chair;with call bell/phone within reach;with chair alarm set Nurse Communication: Mobility status PT Visit Diagnosis: Unsteadiness on feet (R26.81);Other abnormalities of gait and mobility (R26.89);Muscle weakness (generalized) (M62.81);Difficulty in walking, not elsewhere classified (R26.2)     Time: 1027-2536 PT Time Calculation (min) (ACUTE ONLY): 27 min  Charges:    $Therapeutic Activity: 23-37 mins PT General Charges $$ ACUTE PT VISIT: 1 Visit                     Merryl Hacker, PT Acute Rehabilitation Services Office: [phone]    Enedina Finner Erendida Wrenn 01/27/2023,  1:38 PM

## 2023-01-27 NOTE — Plan of Care (Signed)

## 2023-01-27 NOTE — Care Management Important Message (Signed)
Important Message  Patient Details  Name: Monica Conway MRN: [id number] Date of Birth: [birth year]   Important Message Given:  Yes - Medicare IM     Sherilyn Banker 01/27/2023, 2:08 PM

## 2023-01-27 NOTE — TOC Progression Note (Signed)
Transition of Care [facility]) - Progression Note    Patient Details  Name: Monica Conway MRN: [id number] Date of Birth: [birth year]  [facility] of [facility] CM/SW Contact  Eduard Roux, [place] Phone Number: 01/27/2023, 5:25 PM  Clinical Narrative:      CSW informed the patient is confused- called Dianne and Niece, Lucendia Herrlich- no answer- unable to complete SNF assessment at this time. TOC will continue to follow and attempt to reach family.   Antony Blackbird, MSW, LCSW Clinical Social Worker        Expected Discharge Plan and Services                                               Social Determinants of Health (SDOH) Interventions SDOH Screenings   Food Insecurity: Patient Unable To Answer (01/24/2023)  Transportation Needs: Patient Unable To Answer (01/24/2023)  Utilities: Patient Unable To Answer (01/24/2023)  Tobacco Use: Low Risk  (01/24/2023)    Readmission Risk Interventions     No data to display

## 2023-01-27 NOTE — Progress Notes (Signed)
Subjective:   Summary: Monica Conway is a 77 y.o. person living with a history of Afib, HTN, osteoporosis who was brought in by EMS found down and encephalopathic, found to have subacute stroke of R basal ganglia.   Hospital Day 3  Overnight Events: None  Subjective:  Patient is alert and oriented this morning, still with some mild confusion but overall appears improved. Denies further episodes of diarrhea, but continues to urinate frequently. She is eating normally without nausea, vomiting, or abdominal pain. She continues to insist on going home rather than a SNF after discharge. Told her we would talk to her niece today about the feasibility, as she will need to have close supervision after discharge.   Objective:  Vital signs in last 24 hours: Vitals:   01/27/23 0322 01/27/23 0327 01/27/23 0836 01/27/23 0845  BP: (!) 142/75  134/70   Pulse: 78     Resp: 19  17 19   Temp: 98.2 F (36.8 C)  97.6 F (36.4 C)   TempSrc: Oral  Oral   SpO2: 94%     Weight:  52.7 kg    Height:       Supplemental O2: Room Air SpO2: 94 %  Physical Exam:  Constitutional: in no acute distress Cardiovascular: 2/6 systolic murmur. Regular rate Pulmonary/Chest: normal work of breathing on room air, lungs clear to auscultation bilaterally Abdominal: soft, non-tender, non-distended Neuro: A&O x 4, encephalopathy greatly improved. 5/5 strength, no focal deficit noted. Leg strength limited by hip pain Skin: warm and dry Extremities: upper/lower extremity pulses 2+, no lower extremity edema present   Intake/Output Summary (Last 24 hours) at 01/27/2023 1146 Last data filed at 01/27/2023 0647 Gross per 24 hour  Intake 200 ml  Output 550 ml  Net -350 ml   Net IO Since Admission: 2,376.95 mL [01/27/23 1146]  Pertinent Labs:    Latest Ref Rng & Units 01/27/2023    3:34 AM 01/26/2023    4:32 AM 01/25/2023    3:41 AM  CBC  WBC 4.0 - 10.5 K/uL 16.7  17.1  15.4   Hemoglobin 12.0 - 15.0  g/dL 08.6  57.8  9.6   Hematocrit 36.0 - 46.0 % 32.0  34.5  31.2   Platelets 150 - 400 K/uL 340  321  356        Latest Ref Rng & Units 01/27/2023    3:34 AM 01/26/2023    4:32 AM 01/25/2023    3:41 AM  CMP  Glucose 70 - 99 mg/dL 469  629  528   BUN 8 - 23 mg/dL 13  14  21    Creatinine 0.44 - 1.00 mg/dL 4.13  2.44  0.10   Sodium 135 - 145 mmol/L 141  141  145   Potassium 3.5 - 5.1 mmol/L 3.5  2.9  2.9   Chloride 98 - 111 mmol/L 114  114  122   CO2 22 - 32 mmol/L 18  17  15    Calcium 8.9 - 10.3 mg/dL 8.4  8.7  8.9     Imaging: No results found.  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 Has required recurrent  transition to diltiazem drip after oral beta block has failed to control rate.  - Transition to oral metoprolol tartrate 25mg  TID as HR has been decreasing to normal range - on Eliquis 5mg  BID  #Sepsis, resolved #Leukocytosis  Patient has been afebrile and HDS with HR controlled by diltiazem drip. Leukocytosis 16 -> 17.1 -> 16. UA and CXR negative, no obvious wounds as sources for sepsis. Still unknown source for possible infection. As patient has been clinically improving, plan to discontinue antibiotics as there is still no known etiology for infection.  - Antibiotics discontinued 12/3 - blood cultures NGTD  #Ischemic infarct of right basal ganglia #Altered mental status MRI brain showing 2.2 cm evolving subacute ischemic infarct involving the anterior right basal ganglia. No associated hemorrhage or mass effect. 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 CTA negative for large vessel occlusion. Echo without intracardiac source for embolism.  - Neurology  consulted, no need for antiplatelets while anticoagulated - On Eliquis 5mg  BID  #NAGMA Bicarb 15 ->17 ->18. Positive urinary anion gap of 10. Likely indicative of early AKI rather than true RTA. Continue to monitor with daily labs. Replete electrolytes as needed  #Elevated CK Initial CK 293 -> 393 -> 446 -> 387. Likely in setting of acute dehydration vs crush injury after found down for <2 days. As CK has peaked and is now downtrending will stop checking frequent CK levels and continue to encourage oral hydration.   #AKI, resolved Creatinine elevated to 1.7 ->1.1 -> 0.9, unknown baseline. Continue to monitor with daily labs as patient receives fluids and resumes po intake   #Elevated lactate, resolved Initial lactate 2.0, downtrended to 1.4 after IV LR. Likely in setting of acute dehydration.  #Lung nodule Right upper lobe nodular density noted, CT chest with contrast recommended for further evaluation  #PCP follow up Patient told us her PCP (Dr Roxanne Mins) passed away recently. She has seen Dr Lollie Marrow at [facility] cardiology. It is unclear if she has a current PCP. Will ask again as her confusion further resolves.    Diet: Normal VTE: None Code: Full  Dispo: Anticipated discharge to Home pending medical stability.   [name], MD PGY-1 Internal Medicine Resident Pager Number [phone] Please contact the on call pager after 5 pm and on weekends at [phone].

## 2023-01-28 DIAGNOSIS — N179 Acute kidney failure, unspecified: Secondary | ICD-10-CM | POA: Diagnosis not present

## 2023-01-28 DIAGNOSIS — I639 Cerebral infarction, unspecified: Secondary | ICD-10-CM

## 2023-01-28 DIAGNOSIS — I4891 Unspecified atrial fibrillation: Secondary | ICD-10-CM | POA: Diagnosis not present

## 2023-01-28 LAB — CBC
HCT: 33.6 % — ABNORMAL LOW (ref 36.0–46.0)
Hemoglobin: 10.3 g/dL — ABNORMAL LOW (ref 12.0–15.0)
MCH: 29.2 pg (ref 26.0–34.0)
MCHC: 30.7 g/dL (ref 30.0–36.0)
MCV: 95.2 fL (ref 80.0–100.0)
Platelets: 385 10*3/uL (ref 150–400)
RBC: 3.53 MIL/uL — ABNORMAL LOW (ref 3.87–5.11)
RDW: 16.7 % — ABNORMAL HIGH (ref 11.5–15.5)
WBC: 12.3 10*3/uL — ABNORMAL HIGH (ref 4.0–10.5)
nRBC: 0.5 % — ABNORMAL HIGH (ref 0.0–0.2)

## 2023-01-28 LAB — GLUCOSE, CAPILLARY: Glucose-Capillary: 118 mg/dL — ABNORMAL HIGH (ref 70–99)

## 2023-01-28 LAB — BASIC METABOLIC PANEL
Anion gap: 10 (ref 5–15)
BUN: 14 mg/dL (ref 8–23)
CO2: 20 mmol/L — ABNORMAL LOW (ref 22–32)
Calcium: 8.4 mg/dL — ABNORMAL LOW (ref 8.9–10.3)
Chloride: 109 mmol/L (ref 98–111)
Creatinine, Ser: 1.27 mg/dL — ABNORMAL HIGH (ref 0.44–1.00)
GFR, Estimated: 44 mL/min — ABNORMAL LOW (ref >60)
Glucose, Bld: 130 mg/dL — ABNORMAL HIGH (ref 70–99)
Potassium: 2.9 mmol/L — ABNORMAL LOW (ref 3.5–5.1)
Sodium: 139 mmol/L (ref 135–145)

## 2023-01-28 MED ORDER — METOPROLOL TARTRATE 50 MG PO TABS
50.0000 mg | ORAL_TABLET | Freq: Three times a day (TID) | ORAL | Status: AC
  Administered 2023-01-28 – 2023-01-30 (×8): 50 mg via ORAL
  Filled 2023-01-28 (×8): qty 1

## 2023-01-28 MED ORDER — METOPROLOL TARTRATE 25 MG PO TABS
25.0000 mg | ORAL_TABLET | Freq: Three times a day (TID) | ORAL | Status: DC
  Administered 2023-01-28: 25 mg via ORAL
  Filled 2023-01-28: qty 1

## 2023-01-28 MED ORDER — POTASSIUM CHLORIDE CRYS ER 20 MEQ PO TBCR
40.0000 meq | EXTENDED_RELEASE_TABLET | Freq: Two times a day (BID) | ORAL | Status: AC
  Administered 2023-01-28 (×2): 40 meq via ORAL
  Filled 2023-01-28 (×2): qty 2

## 2023-01-28 MED ORDER — METOPROLOL TARTRATE 50 MG PO TABS: 50.0000 mg | ORAL_TABLET | Freq: Three times a day (TID) | ORAL | Status: DC

## 2023-01-28 NOTE — Progress Notes (Signed)
Patient remained confused and agitated on/off throughout the day, had her breakfast abut she refused to eat her lunch, assistance offered and tried to fed her but she denied so ensure protein given approx 50ml and some puddings given, she was incontinent with type 6 stools , bed alarm is on and she is under tele sitter, positioning done every 2hrly, no any skin issues observed.,

## 2023-01-28 NOTE — Progress Notes (Signed)
Subjective:   Summary: Monica Conway is a 76 y.o. person living with a history of Afib, HTN, osteoporosis who was brought in by EMS found down and encephalopathic, found to have subacute stroke of R basal ganglia.   Hospital Day 4  Subjective:  Patient was clinging with both hands to bed rail and would not let go on request. She is able to answer orientation questions appropriately but still appears confused about her overall situation. We called her brother Reita Cliche during rounds and updated on Lili's medical status, and on her likely need for SNF after discharge. Reita Cliche and Janilah were both amenable to the plan.   Objective:  Vital signs in last 24 hours: Vitals:   01/28/23 0740 01/28/23 0929 01/28/23 1215 01/28/23 1420  BP: 112/81 115/76 134/80 135/76  Pulse: 83 (!) 104 98 (!) 102  Resp: 15  19   Temp: 98.4 F (36.9 C)  98.2 F (36.8 C)   TempSrc: Oral  Oral   SpO2: 96%  95%   Weight:      Height:       Supplemental O2: Room Air SpO2: 95 %  Physical Exam:  Constitutional: in no acute distress Cardiovascular: 2/6 systolic murmur. Regular rate Pulmonary/Chest: normal work of breathing on room air, lungs clear to auscultation bilaterally Abdominal: soft, non-tender, non-distended Neuro: A&O x 4, encephalopathy greatly improved. 5/5 strength, no focal deficit noted. Leg strength limited by hip pain Skin: warm and dry Extremities: upper/lower extremity pulses 2+, no lower extremity edema present  Pertinent Labs:    Latest Ref Rng & Units 01/28/2023    3:14 AM 01/27/2023    3:34 AM 01/26/2023    4:32 AM  CBC  WBC 4.0 - 10.5 K/uL 12.3  16.7  17.1   Hemoglobin 12.0 - 15.0 g/dL 78.4  69.6  29.5   Hematocrit 36.0 - 46.0 % 33.6  32.0  34.5   Platelets 150 - 400 K/uL 385  340  321    Assessment/Plan:   #Afib with RVR Heart rates have continued to be elevated to 90s with occasional elevations to mid 110s. Currently on oral metoprolol tartrate 25mg  TID - Increased  to metoprolol tartrate 50mg  q8h (rather than TID dosing) as HR has been elevated overnight  #AKI Creatinine initially elevated to 1.7 with unknown baseline. Downtrended to normal levels with first IV fluids then oral intake, but have started to again uptrend to Cr 1.3 today. Continue to monitor and encourage oral intake. Patient endorses plenty of fluid intake but unsure how reliable this is.   #Ischemic infarct of right basal ganglia #Altered mental status MRI brain showing 2.2 cm evolving subacute ischemic infarct involving the anterior right basal ganglia. No associated hemorrhage or mass effect. 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 This could overall be the etiology for her continued confusion.  - Per PT will need 24/7 1-2 person care. Spoke with family and patient 12/4 who together agreed SNF would be the best place for her after discharge. TOC consulted, working on SNF placement - Continue Eliquis 5mg  BID per neurology  Resolved problems #Elevated lactate Initial lactate 2.0, downtrended to 1.4 after IV LR. Likely in setting of acute dehydration. #Elevated CK Initial CK 293 -> 393 -> 446 -> 387. Likely in setting of acute dehydration vs crush injury after found down for <2 days.  #NAGMA Bicarb  15 ->17 ->18 -> 20. Positive urinary anion gap of 10. Likely indicative of early AKI rather than true RTA.  #Sepsis Patient has been afebrile and HDS. Leukocytosis 16 -> 17.1 -> 12. Still no known source for infection, abx discontinued 12/3  Diet: Normal VTE: Eliquis Code: DNR  Dispo: Anticipated discharge to SNF pending medical stability.   [name], MD PGY-1 Internal Medicine Resident Pager Number [phone] Please contact the on call pager after 5 pm and on weekends at [phone].

## 2023-01-28 NOTE — Plan of Care (Signed)
  Problem: Health Behavior/Discharge Planning: Goal: Ability to manage health-related needs will improve Outcome: Progressing   Problem: Education: Goal: Knowledge of General Education information will improve Description: Including pain rating scale, medication(s)/side effects and non-pharmacologic comfort measures Outcome: Progressing   Problem: Education: Goal: Knowledge of General Education information will improve Description: Including pain rating scale, medication(s)/side effects and non-pharmacologic comfort measures Outcome: Progressing   Problem: Clinical Measurements: Goal: Ability to maintain clinical measurements within normal limits will improve Outcome: Progressing

## 2023-01-28 NOTE — TOC Initial Note (Signed)
Transition of Care [facility]) - Initial/Assessment Note    Patient Details  Name: Monica Conway MRN: [id number] Date of Birth: 09/26/1945  Transition of Care [facility] CM/SW Contact:    Eduard Roux, LCSW Phone Number: 01/28/2023, 1:17 PM  Clinical Narrative:        9:22 am- received message from Dr Carlynn Purl, patient and family is agreeable to rehab at [facility].          12:05 pm- CSW did not disturb patient since she was sleep. Per chart review patient is alert to self & place.   1:15 pm- CSW spoke with patient's brother, Molly Maduro- CSW introduced self and explained role. He states patient lives home alone.He reports patient has been to rehab in the past [facility]). He states patient and family is agreeable to rehab at [facility]. He states he can assist with the process but can not be finically responsible for SNF. CSW explained the SNF process. Preferred SNF is [facility]. All questions answered.   TOC will provide bed offers once available.   Antony Blackbird, MSW, LCSW Clinical Social Worker     Expected Discharge Plan: Skilled Nursing Facility Barriers to Discharge: English as a second language teacher, Continued Medical Work up, SNF Pending bed offer   Patient Goals and CMS Choice            Expected Discharge Plan and Services In-house Referral: Clinical Social Work     Living arrangements for the past 2 months: Single Family Home                                      Prior Living Arrangements/Services Living arrangements for the past 2 months: Single Family Home Lives with:: Self          Need for Family Participation in Patient Care: Yes (Comment) Care giver support system in place?: Yes (comment)      Activities of Daily Living   ADL Screening (condition at time of admission) Independently performs ADLs?: No Does the patient have a NEW difficulty with bathing/dressing/toileting/self-feeding that is expected to last >3 days?: Yes (Initiates electronic notice to provider  for possible OT consult) Does the patient have a NEW difficulty with getting in/out of bed, walking, or climbing stairs that is expected to last >3 days?: Yes (Initiates electronic notice to provider for possible PT consult) Does the patient have a NEW difficulty with communication that is expected to last >3 days?: No Is the patient deaf or have difficulty hearing?: No Does the patient have difficulty seeing, even when wearing glasses/contacts?: No Does the patient have difficulty concentrating, remembering, or making decisions?: Yes  Permission Sought/Granted                  Emotional Assessment       Orientation: : Oriented to Self Alcohol / Substance Use: Not Applicable Psych Involvement: No (comment)  Admission diagnosis:  Disorientation [R41.0] Acute encephalopathy [G93.40] Sepsis without acute organ dysfunction, due to unspecified organism (HCC) [A41.9] Sepsis (HCC) [A41.9] Patient Active Problem List   Diagnosis Date Noted   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 (HCC) 01/26/2023   Paroxysmal atrial fibrillation (HCC) 01/26/2023   Metabolic acidosis 01/26/2023   Acute encephalopathy 01/24/2023   Postoperative anemia due to acute blood loss 08/04/2015   Hypertension    Osteoporosis    Closed left hip fracture (HCC) 08/03/2015   Left displaced femoral neck fracture (HCC)  08/03/2015   Dehydration 02/05/2014   Lower urinary tract infectious disease 02/05/2014   Leukocytosis 02/05/2014   Sepsis (HCC) 02/05/2014   Encephalopathy acute 02/04/2014   SIRS (systemic inflammatory response syndrome) (HCC) 02/04/2014   Elevated LFTs 02/04/2014   PCP:  Coralee Rud, PA-C Pharmacy:   [id number] [place], [place] - [address] AT [facility] OF [facility] & [address] [place] [address] Phone: [phone] Fax: [phone]     Social Determinants of Health (SDOH) Social History: SDOH Screenings    Food Insecurity: Patient Unable To Answer (01/24/2023)  Transportation Needs: Patient Unable To Answer (01/24/2023)  Utilities: Patient Unable To Answer (01/24/2023)  Tobacco Use: Low Risk  (01/24/2023)   SDOH Interventions:     Readmission Risk Interventions     No data to display

## 2023-01-28 NOTE — [place] FL2 (Signed)
[place] MEDICAID FL2 LEVEL OF CARE FORM     IDENTIFICATION  Patient Name: Monica Conway Birthdate: November 27, 1945 Sex: female Admission Date (Current Location): 01/24/2023  [facility] and [place] Number:  Producer, television/film/video and Address:  [facility] [place], 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 (HCC) 01/26/2023   Paroxysmal atrial fibrillation (HCC) 01/26/2023   Metabolic acidosis 01/26/2023   Acute encephalopathy 01/24/2023   Postoperative anemia due to acute blood loss 08/04/2015   Hypertension    Osteoporosis    Closed left hip fracture (HCC) 08/03/2015   Left displaced femoral neck fracture (HCC) 08/03/2015   Dehydration 02/05/2014   Lower urinary tract infectious disease 02/05/2014   Leukocytosis 02/05/2014   Sepsis (HCC) 02/05/2014   Encephalopathy acute 02/04/2014   SIRS (systemic inflammatory response syndrome) (HCC) 02/04/2014   Elevated LFTs 02/04/2014    Orientation RESPIRATION BLADDER Height & Weight     Self, Place  Normal Incontinent, External catheter Weight: 115 lb 4.8 oz (52.3 kg) Height:  5\' 2"  (157.5 cm)  BEHAVIORAL SYMPTOMS/MOOD NEUROLOGICAL BOWEL NUTRITION STATUS      Incontinent Diet (please see discharge summary)  AMBULATORY STATUS COMMUNICATION OF NEEDS Skin   Extensive Assist Verbally Normal                       Personal Care Assistance Level of Assistance  Bathing, Feeding, Dressing Bathing Assistance: Maximum assistance Feeding assistance: Limited assistance Dressing  Assistance: Maximum assistance     Functional Limitations Info  Sight, Hearing, Speech Sight Info: Adequate Hearing Info: Adequate Speech Info: Adequate    SPECIAL CARE FACTORS FREQUENCY  PT (By licensed PT), OT (By licensed OT)     PT Frequency: 5x per week OT Frequency: 5x per week            Contractures Contractures Info: Not present    Additional Factors Info  Code Status, Allergies Code Status Info: DNR Allergies Info: Pollen Extract           Current Medications (01/28/2023):  This is the current hospital active medication list Current Facility-Administered Medications  Medication Dose Route Frequency Provider Last Rate Last Admin   acetaminophen (TYLENOL) tablet 1,000 mg  1,000 mg Oral Q8H Rocky Morel, DO   1,000 mg at 01/27/23 1144   apixaban (ELIQUIS) tablet 5 mg  5 mg Oral BID [name], MD   5 mg at 01/28/23 5621   melatonin tablet 5 mg  5 mg Oral QHS Carmina Miller, DO   5 mg at 01/27/23 2129   metoprolol tartrate (LOPRESSOR) tablet 50 mg  50 mg Oral Q8H Rocky Morel, DO       oxyCODONE (Oxy IR/ROXICODONE) immediate release tablet 2.5-5 mg  2.5-5 mg Oral Q6H PRN Rocky Morel, DO       potassium chloride SA (KLOR-CON M) CR tablet 40 mEq  40 mEq Oral BID [name], MD   40 mEq at 01/28/23 0928   sodium chloride flush (NS) 0.9 %  injection 10 mL  10 mL Intravenous Q12H Rolan Bucco, MD   10 mL at 01/28/23 1027     Discharge Medications: Please see discharge summary for a list of discharge medications.  Relevant Imaging Results:  Relevant Lab Results:   Additional Information [id number]  Eduard Roux, LCSW

## 2023-01-29 DIAGNOSIS — G9340 Encephalopathy, unspecified: Secondary | ICD-10-CM | POA: Diagnosis not present

## 2023-01-29 LAB — CULTURE, BLOOD (ROUTINE X 2)
Culture: NO GROWTH
Culture: NO GROWTH

## 2023-01-29 LAB — BASIC METABOLIC PANEL
Anion gap: 12 (ref 5–15)
BUN: 19 mg/dL (ref 8–23)
CO2: 18 mmol/L — ABNORMAL LOW (ref 22–32)
Calcium: 8.3 mg/dL — ABNORMAL LOW (ref 8.9–10.3)
Chloride: 109 mmol/L (ref 98–111)
Creatinine, Ser: 1.14 mg/dL — ABNORMAL HIGH (ref 0.44–1.00)
GFR, Estimated: 50 mL/min — ABNORMAL LOW (ref >60)
Glucose, Bld: 123 mg/dL — ABNORMAL HIGH (ref 70–99)
Potassium: 3.3 mmol/L — ABNORMAL LOW (ref 3.5–5.1)
Sodium: 139 mmol/L (ref 135–145)

## 2023-01-29 LAB — GLUCOSE, CAPILLARY: Glucose-Capillary: 107 mg/dL — ABNORMAL HIGH (ref 70–99)

## 2023-01-29 MED ORDER — QUETIAPINE FUMARATE 25 MG PO TABS: 25.0000 mg | ORAL_TABLET | Freq: Every day | ORAL | Status: DC | PRN

## 2023-01-29 MED ORDER — LOPERAMIDE HCL 2 MG PO CAPS
2.0000 mg | ORAL_CAPSULE | Freq: Every day | ORAL | Status: DC | PRN
  Administered 2023-01-29 – 2023-01-30 (×2): 2 mg via ORAL
  Filled 2023-01-29 (×3): qty 1

## 2023-01-29 NOTE — Progress Notes (Signed)
Subjective:   Summary: Monica Conway is a 77 y.o. person living with a history of Afib, HTN, osteoporosis who was brought in by EMS found down and encephalopathic, found to have subacute stroke of R basal ganglia.   Hospital Day 5  Subjective:  This morning on rounds patient was too somnolent to be roused and participate in discussion. Saw this afternoon. Patient was alert and oriented x 4, however was insistent on returning home immediately. Also stated the [employer] was at her house last night tearing up her lawn. States she will be suing the treatment team. Denies abdominal pain and says she is overall feeling well. I attempted to call family this morning and this afternoon, will continue to try.   Objective:  Vital signs in last 24 hours: Vitals:   01/28/23 1921 01/28/23 2328 01/29/23 0543 01/29/23 0802  BP: (!) 163/72 (!) 169/78 (!) 165/69 (!) 171/64  Pulse: 67 71 65 (!) 59  Resp: 20 19 20    Temp: 97.7 F (36.5 C) 98.1 F (36.7 C) 98.2 F (36.8 C) 97.9 F (36.6 C)  TempSrc: Oral Oral Oral Oral  SpO2: 98% 97% 96% 97%  Weight:   53.6 kg   Height:       Supplemental O2: Room Air SpO2: 97 %  Physical Exam:  Constitutional: in no acute distress Cardiovascular: 2/6 systolic murmur. Regular rate Pulmonary/Chest: normal work of breathing on room air, lungs clear to auscultation bilaterally Abdominal: soft, non-tender, non-distended Neuro: A&O x 4, delirious. 5/5 strength, no focal deficit noted. Leg strength limited by hip pain Skin: warm and dry Extremities: upper/lower extremity pulses 2+, no lower extremity edema present  Pertinent Labs:    Latest Ref Rng & Units 01/28/2023    3:14 AM 01/27/2023    3:34 AM 01/26/2023    4:32 AM  CBC  WBC 4.0 - 10.5 K/uL 12.3  16.7  17.1   Hemoglobin 12.0 - 15.0 g/dL 16.1  09.6  04.5   Hematocrit 36.0 - 46.0 % 33.6  32.0  34.5   Platelets 150 - 400 K/uL 385  340  321    Assessment/Plan:   #Afib with RVR Heart rates  controlled to 60s-70s on increased dose of Metoprolol tartrate 50mg  q8 hours. Continue current dose. Night team has not needed to add IV metoprolol.   #AKI Creatinine 1.1 today from 1.3 yesterday. Although patient says she has been drinking a lot, difficult to confirm. Continue to monitor with daily labs.   #Ischemic infarct of right basal ganglia #Altered mental status MRI brain showing 2.2 cm evolving subacute ischemic infarct involving the anterior right basal ganglia. This could overall be the etiology for her continued confusion. Continuing Eliquis 5mg  BID  #Disposition Spoke with family and patient who agreed SNF placement will be best for her after discharge. Social work on Medical sales representative [employer] has been completed. Patient has had waxing and waning delirium, occasionally getting angry about having to stay in the hospital. This will be a barrier to her getting SNF placement. She has also been eating less per nursing. Attempting to contact family to have them come and see her in person, may help with a goals of care discussion and possible palliative consult in coming days.  - Seroquel at night PRN ordered for agitation  #Diarrhea Patient has had multiple episodes of loose bowel movements. Leukocytosis has been downtrending and patient does not have  abdominal pain. If white count is elevated tomorrow and she continues to have diarrhea will test for Cdiff. PRN loperamide ordered today  Resolved problems #Elevated lactate Initial lactate 2.0, downtrended to 1.4 after IV LR. Likely in setting of acute dehydration. #Elevated CK Initial CK 293 -> 393 -> 446 -> 387. Likely in setting of acute dehydration vs crush injury after found down for <2 days.  #NAGMA Bicarb 15 ->17 ->18 -> 20. Positive urinary anion gap of 10. Likely indicative of early AKI rather than true RTA.  #Sepsis Patient has been afebrile and HDS. Leukocytosis 16 -> 17.1 -> 12. Still no known source for infection, abx discontinued  12/3  Diet: Normal VTE: Eliquis Code: DNR  Dispo: Anticipated discharge to SNF pending medical stability.   [name], MD PGY-1 Internal Medicine Resident Pager Number [phone] Please contact the on call pager after 5 pm and on weekends at [phone].

## 2023-01-29 NOTE — Progress Notes (Signed)
SLP Cancellation Note  Patient Details Name: TIKESHA STORZ MRN: [id number] DOB: 1946/01/11   Cancelled treatment:        Attempted to see pt for ongoing cognitive-linguistic therapy.  Pt asleep at time of attempt.  Swallowing orders have been discontinued.  Please replace swallowing orders if diet advancement is desired.    Kerrie Pleasure, MA, CCC-SLP Acute Rehabilitation Services Office: [phone] 01/29/2023, 11:14 AM

## 2023-01-29 NOTE — Plan of Care (Signed)
  Problem: Health Behavior/Discharge Planning: Goal: Ability to manage health-related needs will improve Outcome: Progressing   Problem: Education: Goal: Knowledge of General Education information will improve Description Including pain rating scale, medication(s)/side effects and non-pharmacologic comfort measures Outcome: Progressing   Problem: Clinical Measurements: Goal: Ability to maintain clinical measurements within normal limits will improve Outcome: Progressing   Problem: Clinical Measurements: Goal: Will remain free from infection Outcome: Progressing   Problem: Clinical Measurements: Goal: Diagnostic test results will improve Outcome: Progressing   Problem: Clinical Measurements: Goal: Respiratory complications will improve Outcome: Progressing   Problem: Clinical Measurements: Goal: Cardiovascular complication will be avoided Outcome: Progressing

## 2023-01-29 NOTE — Progress Notes (Signed)
Speech Language Pathology Treatment: Cognitive-Linquistic  Patient Details Name: Monica Conway MRN: [id number] DOB: 1945/06/26 Today's Date: 01/29/2023 Time: 4034-7425 SLP Time Calculation (min) (ACUTE ONLY): 16 min  Assessment / Plan / Recommendation Clinical Impression  Pt seen for ongoing cognitive-linguistic intervention.  RN notified SLP pt was awake/alert.  She remains confused, but less so that earlier this week and was able to participate in therapeutic tasks today. She continued to say she needed to get up and get dressed, because she would be late for an appointment, but was easily redirected. She answered orientation questions for time and location accurately except for day of week which was off by one.  She was able to demonstrate use of call bell and demonstrated reasons to call nurse.  Pt exhibited moderate impairment to attention and was able to complete digit span task to 4.  Pt completed calculations with 50% accuracy.  She answered situational problem solving questions with 100% accuracy.  She completed reasoning task completed reasoning (similarities) task adequately.  Pt completed clock drawing task (see image below).  Initially with good layout and organization adding the 12, 3, 6, and 9, but became increasing disorganized as pt was seemingly unable to attend to task.  Pt would benefit from ongoing ST in house and at next level of care.    Clock drawing     HPI HPI: Patient is a 77 y.o. female with PMH: a-fib, HTN, asthma, arthritis, osteoporosis. She was found down and encephalopathic by family after they went to check on her. She arrived at the ED via EMS on 01/24/23. BP was 190/89. 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      SLP Plan  Continue with current plan of care       Recommendations for follow up therapy are one component of a multi-disciplinary discharge planning process, led by the attending physician.  Recommendations may be updated based on patient status, additional functional criteria and insurance authorization.    Recommendations               Frequent or constant Supervision/Assistance Cognitive communication deficit (Z56.387)     Continue with current plan of care     Kerrie Pleasure, MA, CCC-SLP Acute Rehabilitation Services Office: [phone] 01/29/2023, 11:50 AM

## 2023-01-29 NOTE — Progress Notes (Signed)
Physical Therapy Treatment Patient Details Name: Monica Conway MRN: [id number] DOB: 25-Feb-1945 Today's Date: 01/29/2023   History of Present Illness 77 yo female admitted 11/30 after found confused at home with hypokalemia, new Afib and MRI showed a 2.2 cm evolving subacute ischemic infarct involving the anterior right basal ganglia. PMhx: Arthritis, Asthma, Lt THA, HTN    PT Comments  Pt is progressing slowly towards goals. Currently pt is Max A for bed mobility and Total assist for sit to stand and transfers. Pt was able to stand for short period of time 2x, 1 st trial < 30 seconds, 2nd trial > 1 min for self care with 2 person Max A with RW. Due to pt current functional status, home set up and available assistance recommending skilled physical therapy services < 3 hours/day on discharge from acute care hospital setting in order to decrease risk for falls, injury and re-hospitalization. Pt tolerated treatment session well.     If plan is discharge home, recommend the following: Two people to help with walking and/or transfers;Assistance with cooking/housework;Help with stairs or ramp for entrance;Supervision due to cognitive status;Assist for transportation   Can travel by private vehicle     No  Equipment Recommendations  Hospital bed;Hoyer lift;Wheelchair (measurements PT)       Precautions / Restrictions Precautions Precautions: Fall Restrictions Weight Bearing Restrictions: No     Mobility  Bed Mobility Overal bed mobility: Needs Assistance Bed Mobility: Supine to Sit     Supine to sit: Max assist, Used rails     General bed mobility comments: max assist to pivot to EOB and elevate trunk with max multimodal cues, physical assist to scoot to EOB    Transfers Overall transfer level: Needs assistance Equipment used: Rolling walker (2 wheels) Transfers: Sit to/from Stand, Bed to chair/wheelchair/BSC Sit to Stand: Max assist, +2 physical assistance   Step pivot transfers:  Max assist, +2 physical assistance       General transfer comment: Max A +2 to stand from EOB x 2 trials with RW, physical assist to place hands on walker and sequence with physical assist to rise. Pt able to step in standing with Max A for wgt shfiting to align hips to chair. Total +2 to scoot back in chair    Ambulation/Gait     Pre-gait activities: Pt took steps from EOB to recliner with heavy assist with wgt shifting and verbal cues for sequencing and stepping. General Gait Details: unable       Balance Overall balance assessment: Needs assistance Sitting-balance support: Feet supported, Feet unsupported, Single extremity supported, Bilateral upper extremity supported Sitting balance-Leahy Scale: Fair Sitting balance - Comments: use of rail for sitting balance Postural control: Posterior lean Standing balance support: Bilateral upper extremity supported, Reliant on assistive device for balance, During functional activity Standing balance-Leahy Scale: Poor Standing balance comment: UB support on RW with assist of therapist        Cognition Arousal: Alert Behavior During Therapy: WFL for tasks assessed/performed Overall Cognitive Status: Within Functional Limits for tasks assessed       Following Commands: Follows one step commands consistently Safety/Judgement: Decreased awareness of safety     General Comments: Pt is hyper aware of her deficits and reports pain in her body. Pt states varying reports of her mobility including her legs can't move when her knees are locked and she needs lubrication between her legs before she can move.           General Comments  General comments (skin integrity, edema, etc.): No significant deviations in vitals with activity. Pt assisted in self care for clean up in standing for small BM; Nursing present.      Pertinent Vitals/Pain Pain Assessment Faces Pain Scale: Hurts a little bit Pain Location: bil knees and hips Pain  Descriptors / Indicators: Grimacing, Guarding, Sore Pain Intervention(s): Monitored during session, Limited activity within patient's tolerance     PT Goals (current goals can now be found in the care plan section) Acute Rehab PT Goals Patient Stated Goal: go home and never come back to hospital PT Goal Formulation: With patient Time For Goal Achievement: 02/08/23 Potential to Achieve Goals: Fair Progress towards PT goals: Progressing toward goals    Frequency    Min 1X/week      PT Plan  Continue with current POC       AM-PAC PT "6 Clicks" Mobility   Outcome Measure  Help needed turning from your back to your side while in a flat bed without using bedrails?: A Lot Help needed moving from lying on your back to sitting on the side of a flat bed without using bedrails?: A Lot Help needed moving to and from a bed to a chair (including a wheelchair)?: Total Help needed standing up from a chair using your arms (e.g., wheelchair or bedside chair)?: Total Help needed to walk in hospital room?: Total Help needed climbing 3-5 steps with a railing? : Total 6 Click Score: 8    End of Session Equipment Utilized During Treatment: Gait belt Activity Tolerance: Patient tolerated treatment well Patient left: in chair;with call bell/phone within reach;with chair alarm set Nurse Communication: Mobility status PT Visit Diagnosis: Unsteadiness on feet (R26.81);Other abnormalities of gait and mobility (R26.89);Muscle weakness (generalized) (M62.81);Difficulty in walking, not elsewhere classified (R26.2)     Time: 0347-4259 PT Time Calculation (min) (ACUTE ONLY): 27 min  Charges:    $Therapeutic Activity: 23-37 mins PT General Charges $$ ACUTE PT VISIT: 1 Visit                     Harrel Carina, DPT, CLT  Acute Rehabilitation Services Office: [phone] (Secure chat preferred)    Claudia Desanctis 01/29/2023, 4:10 PM

## 2023-01-29 NOTE — TOC Progression Note (Signed)
Transition of Care [facility]) - Progression Note    Patient Details  Name: Monica Conway MRN: [id number] Date of Birth: 05/27/1945  Transition of Care [facility]) CM/SW Contact  Eduard Roux, [place] Phone Number: 01/29/2023, 4:01 PM  Clinical Narrative:    CSW spoke with patient's brother, Molly Maduro,- informed of bed offers, he was agreeable to [facility] once medically stable.  Informed [facility] - family has accepted  bed offer- waiting on response.   Expected Discharge Plan: Skilled Nursing Facility.  Barrier to Discharge- insurance authorization, no telesitter for 24 hrs.   Antony Blackbird, MSW, LCSW Clinical Social Worker    Expected Discharge Plan and Services In-house Referral: Clinical Social Work     Living arrangements for the past 2 months: Single Family Home                                       Social Determinants of Health (SDOH) Interventions SDOH Screenings   Food Insecurity: Patient Unable To Answer (01/24/2023)  Transportation Needs: Patient Unable To Answer (01/24/2023)  Utilities: Patient Unable To Answer (01/24/2023)  Tobacco Use: Low Risk  (01/24/2023)    Readmission Risk Interventions     No data to display

## 2023-01-30 DIAGNOSIS — G9340 Encephalopathy, unspecified: Secondary | ICD-10-CM | POA: Diagnosis not present

## 2023-01-30 LAB — CBC
HCT: 31.5 % — ABNORMAL LOW (ref 36.0–46.0)
Hemoglobin: 9.4 g/dL — ABNORMAL LOW (ref 12.0–15.0)
MCH: 28.5 pg (ref 26.0–34.0)
MCHC: 29.8 g/dL — ABNORMAL LOW (ref 30.0–36.0)
MCV: 95.5 fL (ref 80.0–100.0)
Platelets: 437 10*3/uL — ABNORMAL HIGH (ref 150–400)
RBC: 3.3 MIL/uL — ABNORMAL LOW (ref 3.87–5.11)
RDW: 16.8 % — ABNORMAL HIGH (ref 11.5–15.5)
WBC: 8.8 10*3/uL (ref 4.0–10.5)
nRBC: 0.6 % — ABNORMAL HIGH (ref 0.0–0.2)

## 2023-01-30 LAB — BASIC METABOLIC PANEL
Anion gap: 8 (ref 5–15)
BUN: 13 mg/dL (ref 8–23)
CO2: 21 mmol/L — ABNORMAL LOW (ref 22–32)
Calcium: 8.1 mg/dL — ABNORMAL LOW (ref 8.9–10.3)
Chloride: 109 mmol/L (ref 98–111)
Creatinine, Ser: 1.09 mg/dL — ABNORMAL HIGH (ref 0.44–1.00)
GFR, Estimated: 53 mL/min — ABNORMAL LOW (ref >60)
Glucose, Bld: 103 mg/dL — ABNORMAL HIGH (ref 70–99)
Potassium: 3.1 mmol/L — ABNORMAL LOW (ref 3.5–5.1)
Sodium: 138 mmol/L (ref 135–145)

## 2023-01-30 LAB — GLUCOSE, CAPILLARY: Glucose-Capillary: 83 mg/dL (ref 70–99)

## 2023-01-30 LAB — PHOSPHORUS: Phosphorus: 4.7 mg/dL — ABNORMAL HIGH (ref 2.5–4.6)

## 2023-01-30 LAB — MAGNESIUM: Magnesium: 1.5 mg/dL — ABNORMAL LOW (ref 1.7–2.4)

## 2023-01-30 MED ORDER — METOPROLOL SUCCINATE ER 50 MG PO TB24
150.0000 mg | ORAL_TABLET | Freq: Every day | ORAL | Status: DC
  Administered 2023-01-31: 150 mg via ORAL
  Filled 2023-01-30: qty 1

## 2023-01-30 MED ORDER — AMLODIPINE BESYLATE 5 MG PO TABS
5.0000 mg | ORAL_TABLET | Freq: Every day | ORAL | Status: DC
  Administered 2023-01-30 – 2023-01-31 (×2): 5 mg via ORAL
  Filled 2023-01-30 (×2): qty 1

## 2023-01-30 MED ORDER — PANTOPRAZOLE SODIUM 40 MG PO TBEC
40.0000 mg | DELAYED_RELEASE_TABLET | Freq: Every day | ORAL | Status: DC
  Administered 2023-01-30 – 2023-01-31 (×2): 40 mg via ORAL
  Filled 2023-01-30 (×2): qty 1

## 2023-01-30 MED ORDER — POTASSIUM CHLORIDE CRYS ER 20 MEQ PO TBCR
40.0000 meq | EXTENDED_RELEASE_TABLET | Freq: Two times a day (BID) | ORAL | Status: AC
  Administered 2023-01-30 (×2): 40 meq via ORAL
  Filled 2023-01-30 (×2): qty 2

## 2023-01-30 MED ORDER — MAGNESIUM CHLORIDE 64 MG PO TBEC
1.0000 | DELAYED_RELEASE_TABLET | Freq: Once | ORAL | Status: AC
  Administered 2023-01-30: 64 mg via ORAL
  Filled 2023-01-30: qty 1

## 2023-01-30 NOTE — Progress Notes (Signed)
Subjective:   Summary: Monica Conway is a 77 y.o. person living with a history of Afib, HTN, osteoporosis who was brought in by EMS found down and encephalopathic, found to have subacute stroke of R basal ganglia.   Hospital Day 6  Overnight Events: None  Patient's mental status was much improved today. She was speaking on the phone to her family member about her tax information when we entered the room. She said her brother and niece both visited her yesterday. She continues to have diarrhea but is improving. Spoke with her about future illnesses and if she would like to be rehospitalized if she were to get sick again, which she said she would continue to think about. Overall this patient seems medically appropriate for discharge to SNF with improved cognitive function and medically stable.   Objective:  Vital signs in last 24 hours: Vitals:   01/29/23 2345 01/30/23 0306 01/30/23 0755 01/30/23 1100  BP: (!) 172/69 (!) 179/83 (!) 153/71 (!) 149/71  Pulse: 65 60 68 60  Resp: 20 16 18 20   Temp: 98.2 F (36.8 C) 97.9 F (36.6 C) 98.2 F (36.8 C) 97.8 F (36.6 C)  TempSrc: Axillary Oral Oral Oral  SpO2: 97% 94% 94% 96%  Weight:  53.9 kg    Height:       Supplemental O2: Room Air SpO2: 96 %   Physical Exam:  Constitutional: well-appearing, in no acute distress Cardiovascular: RRR, no murmurs, rubs or gallops Pulmonary/Chest: normal work of breathing on room air, lungs clear to auscultation bilaterally Abdominal: soft, non-tender, non-distended Neuro: 5/5 strength, no focal deficit noted Skin: warm and dry Extremities: upper/lower extremity pulses 2+, no lower extremity edema present  Pertinent Labs:    Latest Ref Rng & Units 01/30/2023    3:33 AM 01/28/2023    3:14 AM 01/27/2023    3:34 AM  CBC  WBC 4.0 - 10.5 K/uL 8.8  12.3  16.7   Hemoglobin 12.0 - 15.0 g/dL 9.4  40.9  81.1   Hematocrit 36.0 - 46.0 % 31.5  33.6  32.0   Platelets 150 - 400 K/uL 437  385   340        Latest Ref Rng & Units 01/30/2023    3:33 AM 01/29/2023    2:52 AM 01/28/2023    3:14 AM  CMP  Glucose 70 - 99 mg/dL 914  782  956   BUN 8 - 23 mg/dL 13  19  14    Creatinine 0.44 - 1.00 mg/dL 2.13  0.86  5.78   Sodium 135 - 145 mmol/L 138  139  139   Potassium 3.5 - 5.1 mmol/L 3.1  3.3  2.9   Chloride 98 - 111 mmol/L 109  109  109   CO2 22 - 32 mmol/L 21  18  20    Calcium 8.9 - 10.3 mg/dL 8.1  8.3  8.4    Assessment/Plan:   #Confusion #Hospital delirium Patient's waxing and waning mental status has been the last barrier to discharge to SNF as her other medical problems have been resolving. Today she was alert and oriented x 4, completely aware of her situation and able to hold a high level conversation with the team. She has not required the prn seroquel previously ordered for agitation.  - Spoke with social work about SNF placement. A location has been decided on by patient and family, still  pending bed and insurance authorization. Unlikely to happen today.   #Afib with RVR Heart rates controlled to 60s-70s with increased dose of metoprolol tartrate 50mg  q8h. Transition to metoprolol succinate 150mg  for easier dosing.   #Diarrhea Slowly resolving. Low suspicion for Cdiff as patient has not had a leukocytosis, no abdominal pain, per nursing report bowel movements are still somewhat formed.  - prn Loperamide  #Ischemic infarct of right basal ganglia #Altered mental status MRI brain showing 2.2 cm evolving subacute ischemic infarct involving the anterior right basal ganglia. This could overall be the etiology for her continued confusion. Continuing Eliquis 5mg  BID  #AKI, resolved Creatinine 1.09 from 1.14 yesterday, at baseline  Resolved problems #Elevated lactate Initial lactate 2.0, downtrended to 1.4 after IV LR. Likely in setting of acute dehydration. #Elevated CK Initial CK 293 -> 393 -> 446 -> 387. Likely in setting of acute dehydration vs crush injury  after found down for <2 days.  #NAGMA Bicarb 15 ->17 ->18 -> 20. Positive urinary anion gap of 10. Likely indicative of early AKI rather than true RTA.  #Sepsis Patient has been afebrile and HDS. Leukocytosis 16 -> 17.1 -> 12. Still no known source for infection, abx discontinued 12/3  Diet: pending SLP eval VTE: Eliquis Code: DNR  Dispo: Anticipated discharge to Skilled nursing facility pending medical stability.   [name], MD PGY-1 Internal Medicine Resident Pager Number [phone] Please contact the on call pager after 5 pm and on weekends at [phone].

## 2023-01-30 NOTE — Progress Notes (Signed)
Speech Language Pathology Treatment: Dysphagia  Patient Details Name: Monica Conway MRN: [id number] DOB: 12/08/1945 Today's Date: 01/30/2023 Time: 1211-1216 SLP Time Calculation (min) (ACUTE ONLY): 5 min  Assessment / Plan / Recommendation Clinical Impression  Pt participated in PO trials today for diet advancement.  RN and MD report improvement in mental status.  Pt accepted one trial each of thin liquid, puree, and regular solid.  She did not exhibit any clinical s/s of aspiration and was able to achieve good oral clearance of solids.  Pt appears safe to advance diet. This may not improve PO intake, because she is concerned about ongoing diarrhea and has been limiting PO intake to address this.  SLP will follow x1 for tolerance of advanced texture diet.  Recommend regular solids with thin liquids.     HPI HPI: Patient is a 77 y.o. female with PMH: a-fib, HTN, asthma, arthritis, osteoporosis. She was found down and encephalopathic by family after they went to check on her. She arrived at the ED via EMS on 01/24/23. BP was 190/89. 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      SLP Plan  Continue with current plan of care      Recommendations for follow up therapy are one component of a multi-disciplinary discharge planning process, led by the attending physician.  Recommendations may be updated based on patient status, additional functional criteria and insurance authorization.    Recommendations  Diet recommendations: Regular;Thin liquid Liquids provided via: Cup;Straw Medication Administration: Whole meds with puree Supervision: Patient able to self feed Compensations: Slow rate;Small sips/bites;Minimize environmental distractions Postural Changes and/or Swallow Maneuvers: Seated upright 90  degrees                  Oral care BID   Frequent or constant Supervision/Assistance Dysphagia, unspecified (R13.10)     Continue with current plan of care     Kerrie Pleasure, MA, CCC-SLP Acute Rehabilitation Services Office: [phone] 01/30/2023, 12:38 PM

## 2023-01-30 NOTE — Progress Notes (Signed)
Occupational Therapy Treatment Patient Details Name: Monica Conway MRN: [id number] DOB: 12-06-1945 Today's Date: 01/30/2023   History of present illness 77 yo female admitted 11/30 after found confused at home with hypokalemia, new Afib and MRI showed a 2.2 cm evolving subacute ischemic infarct involving the anterior right basal ganglia. PMhx: Arthritis, Asthma, Lt THA, HTN   OT comments  Focused session standing trials using Stedy with Mod A, weight shifting and initiation of steps (limited due to reported peri area discomfort). Pt with improving cognition demonstrate by consistent command following, appropriate responses throughout session and expressing needs accurately. Patient will benefit from continued inpatient follow up therapy, <3 hours/day at DC.      If plan is discharge home, recommend the following:  A lot of help with walking and/or transfers;A lot of help with bathing/dressing/bathroom;Assistance with cooking/housework;Direct supervision/assist for medications management;Two people to help with walking and/or transfers;Two people to help with bathing/dressing/bathroom   Equipment Recommendations  Other (comment) (TBD)    Recommendations for Other Services      Precautions / Restrictions Precautions Precautions: Fall Restrictions Weight Bearing Restrictions: No       Mobility Bed Mobility Overal bed mobility: Needs Assistance Bed Mobility: Supine to Sit, Sit to Supine     Supine to sit: Mod assist, HOB elevated, Used rails Sit to supine: Mod assist   General bed mobility comments: good initiation for LE to EOB though assist needed to lift trunk and scoot hips to fully EOB. Assist for LE back to bed    Transfers Overall transfer level: Needs assistance Equipment used: Ambulation equipment used Transfers: Sit to/from Stand Sit to Stand: Mod assist           General transfer comment: Mod A for 3-4 stands with Stedy with cues for tucking bottom in and foot  placement. Pt politely declined transfer to chair due to peri region soreness     Balance Overall balance assessment: Needs assistance Sitting-balance support: Feet supported, Feet unsupported, Single extremity supported, Bilateral upper extremity supported Sitting balance-Leahy Scale: Fair     Standing balance support: Bilateral upper extremity supported, Reliant on assistive device for balance, During functional activity Standing balance-Leahy Scale: Poor                             ADL either performed or assessed with clinical judgement   ADL Overall ADL's : Needs assistance/impaired                                       General ADL Comments: Focus on standing trials with Stedy, weight shifting and therapeutic exercises that pt can complete outside of sessions    Extremity/Trunk Assessment Upper Extremity Assessment Upper Extremity Assessment: Generalized weakness;Right hand dominant   Lower Extremity Assessment Lower Extremity Assessment: Defer to PT evaluation        Vision   Vision Assessment?: No apparent visual deficits   Perception     Praxis      Cognition Arousal: Alert Behavior During Therapy: WFL for tasks assessed/performed Overall Cognitive Status: Impaired/Different from baseline Area of Impairment: Attention, Memory, Following commands, Safety/judgement, Awareness, Problem solving                 Orientation Level: Disoriented to, Situation Current Attention Level: Sustained Memory: Decreased short-term memory Following Commands: Follows one step commands consistently Safety/Judgement: Decreased  awareness of safety, Decreased awareness of deficits Awareness: Intellectual, Emergent Problem Solving: Slow processing, Requires verbal cues, Requires tactile cues General Comments: pleasant, improving cognition w/ good command following and eagerness to participate. still some confusion with general awareness and memory  deficits.        Exercises      Shoulder Instructions       General Comments      Pertinent Vitals/ Pain       Pain Assessment Pain Assessment: Faces Faces Pain Scale: Hurts even more Pain Location: B knees, peri region Pain Descriptors / Indicators: Grimacing, Guarding, Sore, Burning Pain Intervention(s): Monitored during session, Limited activity within patient's tolerance, Other (comment) (notified RN pt preference for lidocaine cream and requesting more cream for peri region)  Home Living                                          Prior Functioning/Environment              Frequency  Min 1X/week        Progress Toward Goals  OT Goals(current goals can now be found in the care plan section)  Progress towards OT goals: Progressing toward goals  Acute Rehab OT Goals Patient Stated Goal: do more exercises OT Goal Formulation: With patient Time For Goal Achievement: 02/08/23 Potential to Achieve Goals: Good ADL Goals Pt Will Perform Grooming: standing;with supervision Pt Will Perform Lower Body Dressing: with supervision;sit to/from stand Pt Will Transfer to Toilet: with supervision;ambulating Additional ADL Goal #1: Pt will demonstrate anticipatory awareness for safe engagement in ADL.  Plan      Co-evaluation                 AM-PAC OT "6 Clicks" Daily Activity     Outcome Measure   Help from another person eating meals?: A Little Help from another person taking care of personal grooming?: A Little Help from another person toileting, which includes using toliet, bedpan, or urinal?: Total Help from another person bathing (including washing, rinsing, drying)?: A Lot Help from another person to put on and taking off regular upper body clothing?: A Little Help from another person to put on and taking off regular lower body clothing?: A Lot 6 Click Score: 14    End of Session    OT Visit Diagnosis: Other abnormalities of gait  and mobility (R26.89);History of falling (Z91.81);Other symptoms and signs involving cognitive function;Pain Pain - Right/Left: Right Pain - part of body: Leg;Knee   Activity Tolerance Patient tolerated treatment well   Patient Left in bed;with call bell/phone within reach;with bed alarm set   Nurse Communication Mobility status;Other (comment) (cream for arthritis and peri region)        Time: 1610-9604 OT Time Calculation (min): 32 min  Charges: OT General Charges $OT Visit: 1 Visit OT Treatments $Self Care/Home Management : 23-37 mins  Bradd Canary, OTR/L Acute Rehab Services Office: [phone]   Lorre Munroe 01/30/2023, 12:07 PM

## 2023-01-30 NOTE — TOC Progression Note (Signed)
Transition of Care [facility]) - Progression Note    Patient Details  Name: Monica Conway MRN: [id number] Date of Birth: 1945/12/15  [facility]) CM/SW Contact  Eduard Roux, [place] Phone Number: 01/30/2023, 4:23 PM  Clinical Narrative:     Received insurance authorization 12/7-12/10 for [facility].   CSW updated SNF- patient can d/c tomorrow, if stable.   Antony Blackbird, MSW, LCSW Clinical Social Worker    Expected Discharge Plan: Skilled Nursing Facility Barriers to Discharge: English as a second language teacher, Continued Medical Work up, SNF Pending bed offer  Expected Discharge Plan and Services In-house Referral: Clinical Social Work     Living arrangements for the past 2 months: Single Family Home                                       Social Determinants of Health (SDOH) Interventions SDOH Screenings   Food Insecurity: Patient Unable To Answer (01/24/2023)  Transportation Needs: Patient Unable To Answer (01/24/2023)  Utilities: Patient Unable To Answer (01/24/2023)  Tobacco Use: Low Risk  (01/24/2023)    Readmission Risk Interventions     No data to display

## 2023-01-31 DIAGNOSIS — I639 Cerebral infarction, unspecified: Secondary | ICD-10-CM

## 2023-01-31 DIAGNOSIS — G9340 Encephalopathy, unspecified: Secondary | ICD-10-CM | POA: Diagnosis not present

## 2023-01-31 HISTORY — DX: Cerebral infarction, unspecified: I63.9

## 2023-01-31 LAB — BASIC METABOLIC PANEL
Anion gap: 9 (ref 5–15)
BUN: 14 mg/dL (ref 8–23)
CO2: 22 mmol/L (ref 22–32)
Calcium: 8.8 mg/dL — ABNORMAL LOW (ref 8.9–10.3)
Chloride: 111 mmol/L (ref 98–111)
Creatinine, Ser: 0.97 mg/dL (ref 0.44–1.00)
GFR, Estimated: >60 mL/min (ref >60)
Glucose, Bld: 92 mg/dL (ref 70–99)
Potassium: 4.2 mmol/L (ref 3.5–5.1)
Sodium: 142 mmol/L (ref 135–145)

## 2023-01-31 LAB — CBC
HCT: 35.5 % — ABNORMAL LOW (ref 36.0–46.0)
Hemoglobin: 10.9 g/dL — ABNORMAL LOW (ref 12.0–15.0)
MCH: 29.4 pg (ref 26.0–34.0)
MCHC: 30.7 g/dL (ref 30.0–36.0)
MCV: 95.7 fL (ref 80.0–100.0)
Platelets: 511 10*3/uL — ABNORMAL HIGH (ref 150–400)
RBC: 3.71 MIL/uL — ABNORMAL LOW (ref 3.87–5.11)
RDW: 16.5 % — ABNORMAL HIGH (ref 11.5–15.5)
WBC: 9.5 10*3/uL (ref 4.0–10.5)
nRBC: 0.5 % — ABNORMAL HIGH (ref 0.0–0.2)

## 2023-01-31 LAB — MAGNESIUM: Magnesium: 1.4 mg/dL — ABNORMAL LOW (ref 1.7–2.4)

## 2023-01-31 MED ORDER — APIXABAN 5 MG PO TABS: 5.0000 mg | ORAL_TABLET | Freq: Two times a day (BID) | ORAL | Status: DC

## 2023-01-31 MED ORDER — MAGNESIUM SULFATE 4 GM/100ML IV SOLN
4.0000 g | Freq: Once | INTRAVENOUS | Status: DC
  Filled 2023-01-31: qty 100

## 2023-01-31 MED ORDER — AMLODIPINE BESYLATE 5 MG PO TABS: 5.0000 mg | ORAL_TABLET | Freq: Every day | ORAL | Status: DC

## 2023-01-31 MED ORDER — MELATONIN 5 MG PO TABS: 5.0000 mg | ORAL_TABLET | Freq: Every day | ORAL | Status: DC

## 2023-01-31 MED ORDER — METOPROLOL SUCCINATE ER 50 MG PO TB24: 150.0000 mg | ORAL_TABLET | Freq: Every day | ORAL | Status: DC

## 2023-01-31 MED ORDER — QUETIAPINE FUMARATE 25 MG PO TABS: 25.0000 mg | ORAL_TABLET | Freq: Every day | ORAL | Status: DC | PRN

## 2023-01-31 MED ORDER — MAGNESIUM OXIDE -MG SUPPLEMENT 400 (240 MG) MG PO TABS
400.0000 mg | ORAL_TABLET | Freq: Once | ORAL | Status: AC
  Administered 2023-01-31: 400 mg via ORAL
  Filled 2023-01-31: qty 1

## 2023-01-31 MED ORDER — ACETAMINOPHEN 500 MG PO TABS: 1000.0000 mg | ORAL_TABLET | Freq: Three times a day (TID) | ORAL | Status: DC | PRN

## 2023-01-31 MED ORDER — LOPERAMIDE HCL 2 MG PO CAPS: 2.0000 mg | ORAL_CAPSULE | Freq: Every day | ORAL | Status: DC | PRN

## 2023-01-31 NOTE — TOC Transition Note (Addendum)
Transition of Care [facility]) - CM/SW Discharge Note   Patient Details  Name: Monica Conway MRN: [id number] Date of Birth: Sep 01, 1945  [facility] CM/SW Contact:  Verna Czech [place], [place] Phone Number: 01/31/2023, 12:47 PM   Clinical Narrative:    Patient to discharge to [facility] today by [facility] transport. Patient will be going to room 509p. RN to call report in to [phone]. DNR form on chart for signature. Patient informed of discharge to [facility] by ambulance. Ambulance contacted at 12:50pm  Joziyah Roblero, LCSW Transition of Care     Final next level of care: Skilled Nursing Facility Barriers to Discharge: English as a second language teacher, Continued Medical Work up, SNF Pending bed offer   Patient Goals and CMS Choice      Discharge Placement                         Discharge Plan and Services Additional resources added to the After Visit Summary for   In-house Referral: Clinical Social Work                                   Social Determinants of Health (SDOH) Interventions SDOH Screenings   Food Insecurity: Patient Unable To Answer (01/24/2023)  Transportation Needs: Patient Unable To Answer (01/24/2023)  Utilities: Patient Unable To Answer (01/24/2023)  Tobacco Use: Low Risk  (01/24/2023)     Readmission Risk Interventions     No data to display

## 2023-01-31 NOTE — Discharge Summary (Signed)
Name: Monica Conway MRN: [id number] DOB: 1945/03/31 77 y.o. PCP: Ronnald Collum  Date of Admission: 01/24/2023  9:51 AM Date of Discharge: 01/31/2023 Attending Physician: Dr. Lafonda Mosses  Discharge Diagnosis: Principal Problem:   Acute encephalopathy Active Problems:   Sepsis (HCC)   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 (HCC)   Paroxysmal atrial fibrillation (HCC)   Metabolic acidosis    Discharge Medications: Allergies as of 01/31/2023       Reactions   Pollen Extract Shortness Of Breath        Medication List     STOP taking these medications    Xarelto 20 MG Tabs tablet Generic drug: rivaroxaban       TAKE these medications    acetaminophen 500 MG tablet Commonly known as: TYLENOL Take 2 tablets (1,000 mg total) by mouth every 8 (eight) hours as needed for mild pain (pain score 1-3).   amLODipine 5 MG tablet Commonly known as: NORVASC Take 1 tablet (5 mg total) by mouth daily. Start taking on: February 01, 2023   apixaban 5 MG Tabs tablet Commonly known as: ELIQUIS Take 1 tablet (5 mg total) by mouth 2 (two) times daily.   B-12 PO Take 1 tablet by mouth daily.   loperamide 2 MG capsule Commonly known as: IMODIUM Take 1 capsule (2 mg total) by mouth daily as needed for diarrhea or loose stools.   melatonin 5 MG Tabs Take 1 tablet (5 mg total) by mouth at bedtime.   metoprolol succinate 50 MG 24 hr tablet Commonly known as: TOPROL-XL Take 3 tablets (150 mg total) by mouth daily. Start taking on: February 01, 2023 What changed:  medication strength how much to take additional instructions   pantoprazole 40 MG tablet Commonly known as: PROTONIX Take 40 mg by mouth daily. Patient stated she in not on this medication. 01/25/2023.   Prolia 60 MG/ML Sosy injection Generic drug: denosumab Inject 60 mg into the skin every 6 (six) months.   QUEtiapine 25 MG tablet Commonly known as: SEROQUEL Take 1 tablet (25 mg total) by  mouth daily as needed (As needed at night for agitation).   Ventolin HFA 108 (90 Base) MCG/ACT inhaler Generic drug: albuterol Inhale 1 puff into the lungs 4 (four) times daily.        Disposition and follow-up:   Ms.Monica Conway was discharged from [facility] in Good condition.  At the hospital follow up visit please address:  1.  Follow-up:  a. Heart rate control and side effects on new dose of metoprolol.    b. Ensure that she will get help at home when she is able to leave SNF. We think she would benefit from 24 hour care at home.   c.  Please continue goals of care conversations as she has had a big change in her functional status.   Hospital Course by problem list: #Ischemic infarct of right basal ganglia #Altered mental status Patient was brought in by EMS to the ED on 01/24/23 after found down at home where she lives alone. She was last seen normal by family two days prior. On admission she was encephalopathic and tachycardic. MRI brain showed 2.2 cm basal ganglia stroke on the right. Neurology was consulted, and home Eliquis was resumed. On hospital day 1 patient's mental status was much improved, though she continued to have waxing and waning confusion and delirium likely caused by her stroke and hospital delirium. No acute strength or sensory  deficits were noted, though patient's functional status was severely decreased and PT/OT agreed patient would need rehabilitation in a SNF after hospital discharge.  Overall she will need continued risk factor modification with control of her atrial fibrillation, anticoagulation, and good blood pressure control.  #Sepsis #Leukocytosis Patient was hypertensive and tachycardic when she arrived to the ED with a leukocytosis to 16. UA and CXR were negative and patient had no obvious wounds as source for sepsis. Patient was started on empiric vancomycin, cefepime, and metronidazole. Two sets of blood cultures were taken.  Patient's leukocytosis continued to downtrend with antibiotics and her functional status continued to improve. Blood cultures never showed growth of any bacteria. Antibiotics were discontinued on 12/3.  She did not have another fever and no definite source of infection was identified.  #Afib with RVR Patient with a history of Afib on Xarelto and metoprolol. Heart rates were increased to 180s in the setting of acute illness. A diltiazem drip was required multiple times to control HR. Patient was eventually transferred to TID dosing of metoprolol tartrate for rate control, then finally to daily dosing of metoprolol succinate.  Her dose had to be increased from admission to 150 mg daily.  #AKI Creatinine initially elevated to 1.67, likely in the setting of decreased oral intake while she was down. Unknown baseline but creatinine trended down to 0.97 and fluctuated between this and 1.3 during admission.  #Diarrhea Patient had multiple daily episodes of diarrhea while admitted. Cdiff was considered, though leukocytosis was still downtrending and patient did not endorse abdominal pain or other systemic symptoms. Diarrhea improved with imodium.   #NAGMA NAGMA with bicarb to 15. Urine electrolytes indicate etiology likely early AKI. In the setting of diarrhea, etiology could also be GI losses. NAGMA resolved as patient's clinical status improved.   #Elevated lactate Lactate initially elevated to 2.0, likely in the setting of decreased oral intake and sepsis. Lactate downtrended with antibiotics and increased oral intake.   #Elevated CK CK initially elevated to 293, which peaked on hospital day 2 at 446 then continued to downtrend.  No signs of obstructive nephropathy but mild CK elevation likely due to prolonged downtime.  Low concern for seizure.  #Lung nodule Right upper lobe nodular density noted, CT chest with contrast recommended for further evaluation.  This was not done during admission due to  AKI but I would recommend a follow-up chest x-ray and CT after this if its within the patient's goals to treat a possible malignancy.  #PCP follow up Patient told us her PCP (Dr Roxanne Mins) passed away recently. She has seen Dr Lollie Marrow at [facility] cardiology. It is unclear if she has a current PCP.  We recommend that she establishes with a PCP at [facility] to keep her care in one system.   Discharge Subjective: Patient is doing well this morning with some continued frequent bowel movements but no change in color and no dark bowel movements.  She is ready to go to rehab.  Discharge Exam:   BP (!) 163/80 (BP Location: Left Arm)   Pulse 64   Temp 97.6 F (36.4 C) (Oral)   Resp 15   Ht 5\' 2"  (1.575 m)   Wt 52.9 kg   SpO2 96%   BMI 21.33 kg/m  Constitutional: Frail-appearing elderly female laying in bed, in no acute distress Cardiovascular: regular rate and rhythm Pulmonary/Chest: normal work of breathing on room air Neurological: alert & oriented x 3  Pertinent Labs, Studies, and Procedures:  Latest Ref Rng & Units 01/31/2023    2:43 AM 01/30/2023    3:33 AM 01/28/2023    3:14 AM  CBC  WBC 4.0 - 10.5 K/uL 9.5  8.8  12.3   Hemoglobin 12.0 - 15.0 g/dL 47.4  9.4  25.9   Hematocrit 36.0 - 46.0 % 35.5  31.5  33.6   Platelets 150 - 400 K/uL 511  437  385        Latest Ref Rng & Units 01/31/2023    2:43 AM 01/30/2023    3:33 AM 01/29/2023    2:52 AM  CMP  Glucose 70 - 99 mg/dL 92  563  875   BUN 8 - 23 mg/dL 14  13  19    Creatinine 0.44 - 1.00 mg/dL 6.43  3.29  5.18   Sodium 135 - 145 mmol/L 142  138  139   Potassium 3.5 - 5.1 mmol/L 4.2  3.1  3.3   Chloride 98 - 111 mmol/L 111  109  109   CO2 22 - 32 mmol/L 22  21  18    Calcium 8.9 - 10.3 mg/dL 8.8  8.1  8.3     CT ANGIO HEAD NECK W WO CM  Result Date: 01/25/2023 CLINICAL DATA:  Follow-up examination for stroke. EXAM: CT ANGIOGRAPHY HEAD AND NECK WITH AND WITHOUT CONTRAST TECHNIQUE: Multidetector CT imaging of the  head and neck was performed using the standard protocol during bolus administration of intravenous contrast. Multiplanar CT image reconstructions and MIPs were obtained to evaluate the vascular anatomy. Carotid stenosis measurements (when applicable) are obtained utilizing NASCET criteria, using the distal internal carotid diameter as the denominator. RADIATION DOSE REDUCTION: This exam was performed according to the departmental dose-optimization program which includes automated exposure control, adjustment of the mA and/or kV according to patient size and/or use of iterative reconstruction technique. CONTRAST:  75mL OMNIPAQUE IOHEXOL 350 MG/ML SOLN COMPARISON:  MRI from 01/24/2023. FINDINGS: CT HEAD FINDINGS Brain: Cerebral volume within normal limits. Mild chronic microvascular ischemic disease. Chronic right PCA distribution infarct. Previously identified evolving right basal ganglia subacute infarct again noted, relatively stable from prior. No acute intracranial hemorrhage. No other acute large vessel territory infarct. No mass lesion or midline shift. No hydrocephalus or extra-axial fluid collection. Vascular: No abnormal hyperdense vessel. Skull: Scalp soft tissues within normal limits.  Calvarium intact. Sinuses/Orbits: Globes orbital soft tissues within normal limits. Paranasal sinuses are largely clear. No mastoid effusion. Other: None. Review of the MIP images confirms the above findings CTA NECK FINDINGS Aortic arch: Visualized aortic arch within normal limits for caliber with standard 3 vessel morphology. Mild aortic atherosclerosis. No stenosis about the origin the great vessels. Right carotid system: Right common and internal carotid arteries are tortuous but patent without dissection. Minimal atheromatous change about the right carotid bulb without stenosis. Left carotid system: Left common and internal carotid arteries are tortuous but patent without dissection. Minimal atheromatous change about  the left carotid bulb without stenosis. Vertebral arteries: Both vertebral arteries arise from the subclavian arteries. 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 Vertebral arteries otherwise patent without stenosis or dissection. Skeleton: No discrete or worrisome osseous lesions. 4 mm facet mediated anterolisthesis of C6 on C7, with trace anterolisthesis of C7 on T1. Other neck: No other acute finding. Asymmetric atrophy of the right parotid gland noted. Upper chest: No other acute finding. Review of the MIP images confirms the above findings CTA HEAD FINDINGS Anterior circulation: Mild  atheromatous change about the carotid siphons without stenosis. A1 segments, anterior communicating artery complex common anterior cerebral arteries patent without stenosis. No M1 stenosis or occlusion. Distal MCA branches perfused and symmetric. Posterior circulation: Both V4 segments patent without stenosis. Both PICA patent. Basilar patent without stenosis. Superior cerebral arteries patent bilaterally. Right PCA supplied via the basilar. Fetal type origin of the left PCA. Both PCAs patent distal aspects without proximal stenosis. Venous sinuses: Not well assessed due to timing the contrast bolus. Anatomic variants: As above.  No aneurysm. Review of the MIP images confirms the above findings IMPRESSION: CT HEAD: 1. Stable evolving subacute right basal ganglia infarct. No new acute intracranial abnormality. 2. Chronic right PCA distribution infarct. 3. Mild chronic microvascular ischemic disease. CTA HEAD AND NECK: 1. Negative CTA for large vessel occlusion or other emergent finding. 2. 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 3. Mild for age atheromatous change elsewhere about the carotid bifurcations and carotid siphons without hemodynamically significant or correctable stenosis. 4. Diffuse tortuosity of the major arterial vasculature of the  head and neck, suggesting chronic underlying hypertension. 5.  Aortic Atherosclerosis (ICD10-I70.0). Electronically Signed   By: Rise Mu M.D.   On: 01/25/2023 20:21   ECHOCARDIOGRAM COMPLETE BUBBLE STUDY  Result Date: 01/25/2023    ECHOCARDIOGRAM REPORT   Patient Name:   Monica Conway Date of Exam: 01/25/2023 Medical Rec #:  [id number]    Height:       62.0 in Accession #:    [id number]   Weight:       111.8 lb Date of Birth:  [birth year]    BSA:          1.493 m Patient Age:    77 years     BP:           160/64 mmHg Patient Gender: F            HR:           74 bpm. Exam Location:  Inpatient Procedure: 2D Echo, Color Doppler and Cardiac Doppler Indications:    Stroke  History:        Patient has no prior history of Echocardiogram examinations.                 Arrythmias:Atrial Fibrillation; Risk Factors:Hypertension.  Sonographer:    Raeford Razor RDCS Referring Phys: [phone] EMILY B MULLEN  Sonographer Comments: Image acquisition challenging due to patient body habitus. IMPRESSIONS  1. Left ventricular ejection fraction, by estimation, is 55 to 60%. The left ventricle has normal function. The left ventricle has no regional wall motion abnormalities. Left ventricular diastolic parameters are consistent with Grade I diastolic dysfunction (impaired relaxation).  2. Right ventricular systolic function is normal. The right ventricular size is normal. Tricuspid regurgitation signal is inadequate for assessing PA pressure.  3. The mitral valve is grossly normal. Trivial mitral valve regurgitation. No evidence of mitral stenosis.  4. The aortic valve was not well visualized. There is mild calcification of the aortic valve. There is mild thickening of the aortic valve. Aortic valve regurgitation is not visualized. Mild to moderate aortic valve stenosis. Aortic valve area, by VTI measures 1.23 cm. Aortic valve mean gradient measures 18.4 mmHg. Aortic valve Vmax measures 2.80 m/s.  5. The inferior vena cava is normal  in size with greater than 50% respiratory variability, suggesting right atrial pressure of 3 mmHg.  6. Agitated saline contrast bubble study was negative, with no  evidence of any interatrial shunt. Conclusion(s)/Recommendation(s): No intracardiac source of embolism detected on this transthoracic study. Consider a transesophageal echocardiogram to exclude cardiac source of embolism if clinically indicated. FINDINGS  Left Ventricle: Left ventricular ejection fraction, by estimation, is 55 to 60%. The left ventricle has normal function. The left ventricle has no regional wall motion abnormalities. The left ventricular internal cavity size was normal in size. There is  no left ventricular hypertrophy. Left ventricular diastolic parameters are consistent with Grade I diastolic dysfunction (impaired relaxation). Right Ventricle: The right ventricular size is normal. No increase in right ventricular wall thickness. Right ventricular systolic function is normal. Tricuspid regurgitation signal is inadequate for assessing PA pressure. Left Atrium: Left atrial size was normal in size. Right Atrium: Right atrial size was normal in size. Pericardium: There is no evidence of pericardial effusion. Mitral Valve: The mitral valve is grossly normal. Trivial mitral valve regurgitation. No evidence of mitral valve stenosis. Tricuspid Valve: The tricuspid valve is grossly normal. Tricuspid valve regurgitation is trivial. No evidence of tricuspid stenosis. Aortic Valve: The aortic valve was not well visualized. There is mild calcification of the aortic valve. There is mild thickening of the aortic valve. Aortic valve regurgitation is not visualized. Mild to moderate aortic stenosis is present. Aortic valve  mean gradient measures 18.4 mmHg. Aortic valve peak gradient measures 31.4 mmHg. Aortic valve area, by VTI measures 1.23 cm. Pulmonic Valve: The pulmonic valve was grossly normal. Pulmonic valve regurgitation is not visualized. No  evidence of pulmonic stenosis. Aorta: The aortic root is normal in size and structure. Venous: The inferior vena cava is normal in size with greater than 50% respiratory variability, suggesting right atrial pressure of 3 mmHg. IAS/Shunts: The atrial septum is grossly normal. Agitated saline contrast was given intravenously to evaluate for intracardiac shunting. Agitated saline contrast bubble study was negative, with no evidence of any interatrial shunt.  LEFT VENTRICLE PLAX 2D LVIDd:         4.30 cm     Diastology LVIDs:         2.80 cm     LV e' medial:    3.15 cm/s LV PW:         0.80 cm     LV E/e' medial:  22.9 LV IVS:        0.90 cm     LV e' lateral:   5.26 cm/s LVOT diam:     2.20 cm     LV E/e' lateral: 13.7 LV SV:         73 LV SV Index:   49 LVOT Area:     3.80 cm  LV Volumes (MOD) LV vol d, MOD A2C: 72.0 ml LV vol d, MOD A4C: 79.8 ml LV vol s, MOD A2C: 27.0 ml LV vol s, MOD A4C: 27.0 ml LV SV MOD A2C:     45.0 ml LV SV MOD A4C:     79.8 ml LV SV MOD BP:      50.5 ml RIGHT VENTRICLE             IVC RV Basal diam:  3.30 cm     IVC diam: 1.70 cm RV Mid diam:    1.90 cm RV S prime:     11.20 cm/s TAPSE (M-mode): 2.0 cm LEFT ATRIUM             Index        RIGHT ATRIUM  Index LA diam:        3.40 cm 2.28 cm/m   RA Area:     13.60 cm LA Vol (A2C):   54.1 ml 36.24 ml/m  RA Volume:   33.00 ml  22.11 ml/m LA Vol (A4C):   46.9 ml 31.42 ml/m LA Biplane Vol: 52.2 ml 34.97 ml/m  AORTIC VALVE AV Area (Vmax):    1.25 cm AV Area (Vmean):   1.17 cm AV Area (VTI):     1.23 cm AV Vmax:           280.38 cm/s AV Vmean:          202.746 cm/s AV VTI:            0.594 m AV Peak Grad:      31.4 mmHg AV Mean Grad:      18.4 mmHg LVOT Vmax:         92.00 cm/s LVOT Vmean:        62.200 cm/s LVOT VTI:          0.192 m LVOT/AV VTI ratio: 0.32  AORTA Ao Root diam: 2.90 cm MITRAL VALVE MV Area (PHT): 3.60 cm    SHUNTS MV Decel Time: 211 msec    Systemic VTI:  0.19 m MV E velocity: 72.20 cm/s  Systemic Diam: 2.20 cm  MV A velocity: 98.50 cm/s MV E/A ratio:  0.73 Lennie Odor MD Electronically signed by Lennie Odor MD Signature Date/Time: 01/25/2023/2:54:18 PM    Final    MR BRAIN WO CONTRAST  Result Date: 01/24/2023 CLINICAL DATA:  Initial evaluation for mental status change, unknown cause. EXAM: MRI HEAD WITHOUT CONTRAST TECHNIQUE: Multiplanar, multiecho pulse sequences of the brain and surrounding structures were obtained without intravenous contrast. COMPARISON:  Prior CT from earlier the same day. FINDINGS: Brain: Cerebral volume within normal limits for age. Few scattered patchy foci of T2/FLAIR hyperintensity involving the periventricular deep white matter both cerebral hemispheres, consistent with chronic small vessel ischemic disease, mild in nature. Chronic right PCA distribution infarct involving the right occipital lobe noted. Mild chronic hemosiderin staining at this location. 2.2 cm focus of diffusion signal abnormality seen involving the anterior right basal ganglia. Associated T2/FLAIR signal intensity without ADC correlate. Finding is consistent with an evolving subacute ischemic infarct. No associated hemorrhage or mass effect. No other evidence for acute or subacute ischemia. Gray-white matter differentiation otherwise maintained. No acute intracranial hemorrhage. Additional single chronic microhemorrhage noted at the left periatrial white matter, of doubtful significance in isolation. No mass lesion, midline shift or mass effect. No hydrocephalus or extra-axial fluid collection. Pituitary gland and suprasellar region within normal limits. Vascular: Major intracranial vascular flow voids are maintained. Skull and upper cervical spine: Craniocervical junction normal. Bone marrow signal intensity within normal limits. No scalp soft tissue abnormality. Sinuses/Orbits: Prior bilateral ocular lens replacement. Paranasal sinuses are largely clear. No significant mastoid effusion. Other: None. IMPRESSION: 1.  2.2 cm evolving subacute ischemic infarct involving the anterior right basal ganglia. No associated hemorrhage or mass effect. 2. 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 Electronically Signed   By: Rise Mu M.D.   On: 01/24/2023 23:55   EEG adult  Result Date: 01/24/2023 Charlsie Quest, MD     01/24/2023  5:30 PM Patient Name: Monica Conway MRN: [id number] Epilepsy Attending: Charlsie Quest Referring Physician/Provider: Rocky Morel, DO Date: 01/24/2023 Duration: 26.26 mins Patient history: 77yo F with ams getting eeg to evaluate for seizure  Level of alertness: Awake AEDs during EEG study: None Technical aspects: This EEG study was done with scalp electrodes positioned according to the 10-20 International system of electrode placement. Electrical activity was reviewed with band pass filter of 1-70Hz , sensitivity of 7 uV/mm, display speed of 18mm/sec with a 60Hz  notched filter applied as appropriate. EEG data were recorded continuously and digitally stored.  Video monitoring was available and reviewed as appropriate. Description: EEG showed continuous generalized 3 to 6 Hz theta-delta slowing. Generalized periodic discharges with triphasic morphology at  1-2 Hz were also noted. Hyperventilation and photic stimulation were not performed.   ABNORMALITY - Periodic discharges with triphasic morphology, generalized ( GPDs) - Continuous slow, generalized IMPRESSION: This study is suggestive of moderate diffuse encephalopathy most likely secondary to toxic-metabolic causes like cefepime toxicity. No seizures or definite epileptiform discharges were seen throughout the recording. Charlsie Quest   DG Knee Complete 4 Views Left  Result Date: 01/24/2023 CLINICAL DATA:  Chronic left leg pain. EXAM: LEFT KNEE - COMPLETE 4+ VIEW COMPARISON:  None Available. FINDINGS: Moderate suprapatellar joint effusion is noted. No acute fracture or  dislocation is noted. Moderate narrowing of medial and lateral joint spaces are noted. 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 Moderate suprapatellar joint effusion. No fracture or dislocation. Electronically Signed   By: Lupita Raider M.D.   On: 01/24/2023 12:19   DG Hip Unilat W or Wo Pelvis 2-3 Views Left  Result Date: 01/24/2023 CLINICAL DATA:  Chronic left lower extremity pain. EXAM: DG HIP (WITH OR WITHOUT PELVIS) 2-3V LEFT COMPARISON:  August 03, 2015. FINDINGS: Status post left total hip arthroplasty. Probable subacute to old fracture is seen involving the left side of the pubic symphysis as well as the left superior pubic ramus. Right hip is unremarkable. IMPRESSION: Status post left total hip arthroplasty. Probable subacute to old fracture involving the left-sided pubic symphysis as well as left superior pubic ramus. Electronically Signed   By: Lupita Raider M.D.   On: 01/24/2023 12:18   DG Chest 1 View  Result Date: 01/24/2023 CLINICAL DATA:  Altered mental status, weakness. EXAM: CHEST  1 VIEW COMPARISON:  August 03, 2015. FINDINGS: The heart size and mediastinal contours are within normal limits. Moderate size hiatal hernia is noted. Left lung is clear. Right upper lobe nodular density is noted. The visualized skeletal structures are unremarkable. IMPRESSION: Right upper lobe nodular density is noted. CT scan of the chest with intravenous contrast is recommended for further evaluation. Electronically Signed   By: Lupita Raider M.D.   On: 01/24/2023 12:16   CT Head Wo Contrast  Result Date: 01/24/2023 CLINICAL DATA:  Mental status change with unknown cause. EXAM: CT HEAD WITHOUT CONTRAST TECHNIQUE: Contiguous axial images were obtained from the base of the skull through the vertex without intravenous contrast. RADIATION DOSE REDUCTION: This exam was performed according to the departmental dose-optimization program which includes automated exposure control,  adjustment of the mA and/or kV according to patient size and/or use of iterative reconstruction technique. COMPARISON:  02/05/2014 FINDINGS: Brain: No evidence of acute infarction, hemorrhage, hydrocephalus, extra-axial collection or mass lesion/mass effect. Chronic right occipital infarct since prior. Right basal ganglia perforator infarct, likely also chronic given discrete and low-density appearance, also new from prior. Vascular: No hyperdense vessel or unexpected calcification. Skull: Normal. Negative for fracture or focal lesion. Sinuses/Orbits: Bilateral cataract resection IMPRESSION: 1. No acute finding. 2. 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 Electronically Signed  By: Tiburcio Pea M.D.   On: 01/24/2023 11:47     Discharge Instructions: Discharge Instructions     Diet - low sodium heart healthy   Complete by: As directed    Increase activity slowly   Complete by: As directed        Signed: Rocky Morel, DO Internal Medicine Resident, PGY-2 Pager# [phone] 01/31/2023, 12:02 PM   Please contact the on call pager after 5 pm and on weekends at [phone].

## 2023-01-31 NOTE — Discharge Instructions (Addendum)
Monica Conway,  You were recently admitted to [facility] for a stroke and encephalopathy that improved after we gave you IV fluids, antibiotics, and controlled your heart rate. We are glad you are doing better and will get more rehab at [facility].   Continue taking your home medications with the following changes  Start taking Amlodipine 5 mg daily Metoprolol succinate 150 mg daily Eliquis 5 mg twice daily Stop taking Xarelto  You should seek further medical care if you have any return of your symptoms or other bothersome issues.  We recommend that you see your primary care doctor in about a week to make sure that you continue to improve. We are so glad that you are feeling better.  Sincerely, Rocky Morel, DO

## 2023-01-31 NOTE — Progress Notes (Signed)
Patient with a complaint of "freezing" and requesting more warm blankets and to increase the room temperature. The patient was informed the room temperature was already at the maximum range of 80 degrees. Additional warm blankets were provided and patient's temperature was taken and found to be with in normal limits. Will continue to monitor.

## 2023-02-02 ENCOUNTER — Encounter (HOSPITAL_COMMUNITY): Payer: Self-pay | Admitting: Emergency Medicine

## 2023-02-02 ENCOUNTER — Emergency Department (HOSPITAL_COMMUNITY)
Admission: EM | Admit: 2023-02-02 | Discharge: 2023-02-03 | Disposition: A | Discharge status: Other Acute Inpatient Hospital | Payer: Medicare HMO | Attending: Emergency Medicine | Admitting: Emergency Medicine

## 2023-02-02 ENCOUNTER — Emergency Department (HOSPITAL_COMMUNITY): Payer: Medicare HMO

## 2023-02-02 ENCOUNTER — Other Ambulatory Visit: Payer: Self-pay

## 2023-02-02 DIAGNOSIS — W19XXXD Unspecified fall, subsequent encounter: Secondary | ICD-10-CM | POA: Insufficient documentation

## 2023-02-02 DIAGNOSIS — M25552 Pain in left hip: Secondary | ICD-10-CM | POA: Diagnosis present

## 2023-02-02 DIAGNOSIS — Z7901 Long term (current) use of anticoagulants: Secondary | ICD-10-CM | POA: Diagnosis not present

## 2023-02-02 DIAGNOSIS — Z8673 Personal history of transient ischemic attack (TIA), and cerebral infarction without residual deficits: Secondary | ICD-10-CM | POA: Insufficient documentation

## 2023-02-02 DIAGNOSIS — D72829 Elevated white blood cell count, unspecified: Secondary | ICD-10-CM | POA: Diagnosis not present

## 2023-02-02 DIAGNOSIS — S32592G Other specified fracture of left pubis, subsequent encounter for fracture with delayed healing: Secondary | ICD-10-CM | POA: Insufficient documentation

## 2023-02-02 LAB — CBC WITH DIFFERENTIAL/PLATELET
Abs Immature Granulocytes: 0.28 10*3/uL — ABNORMAL HIGH (ref 0.00–0.07)
Basophils Absolute: 0.1 10*3/uL (ref 0.0–0.1)
Basophils Relative: 0 %
Eosinophils Absolute: 0.1 10*3/uL (ref 0.0–0.5)
Eosinophils Relative: 0 %
HCT: 35.3 % — ABNORMAL LOW (ref 36.0–46.0)
Hemoglobin: 10.7 g/dL — ABNORMAL LOW (ref 12.0–15.0)
Immature Granulocytes: 2 %
Lymphocytes Relative: 8 %
Lymphs Abs: 1.1 10*3/uL (ref 0.7–4.0)
MCH: 29.4 pg (ref 26.0–34.0)
MCHC: 30.3 g/dL (ref 30.0–36.0)
MCV: 97 fL (ref 80.0–100.0)
Monocytes Absolute: 1.7 10*3/uL — ABNORMAL HIGH (ref 0.1–1.0)
Monocytes Relative: 12 %
Neutro Abs: 10.6 10*3/uL — ABNORMAL HIGH (ref 1.7–7.7)
Neutrophils Relative %: 78 %
Platelets: 660 10*3/uL — ABNORMAL HIGH (ref 150–400)
RBC: 3.64 MIL/uL — ABNORMAL LOW (ref 3.87–5.11)
RDW: 16.8 % — ABNORMAL HIGH (ref 11.5–15.5)
WBC: 13.8 10*3/uL — ABNORMAL HIGH (ref 4.0–10.5)
nRBC: 0 % (ref 0.0–0.2)

## 2023-02-02 LAB — TYPE AND SCREEN
ABO/RH(D): A POS
Antibody Screen: NEGATIVE

## 2023-02-02 LAB — BASIC METABOLIC PANEL
Anion gap: 12 (ref 5–15)
BUN: 21 mg/dL (ref 8–23)
CO2: 20 mmol/L — ABNORMAL LOW (ref 22–32)
Calcium: 8.8 mg/dL — ABNORMAL LOW (ref 8.9–10.3)
Chloride: 106 mmol/L (ref 98–111)
Creatinine, Ser: 1.73 mg/dL — ABNORMAL HIGH (ref 0.44–1.00)
GFR, Estimated: 30 mL/min — ABNORMAL LOW (ref >60)
Glucose, Bld: 95 mg/dL (ref 70–99)
Potassium: 4.3 mmol/L (ref 3.5–5.1)
Sodium: 138 mmol/L (ref 135–145)

## 2023-02-02 LAB — PROTIME-INR
INR: 1.9 — ABNORMAL HIGH (ref 0.8–1.2)
Prothrombin Time: 21.7 s — ABNORMAL HIGH (ref 11.4–15.2)

## 2023-02-02 MED ORDER — OXYCODONE-ACETAMINOPHEN 5-325 MG PO TABS: 1.0000 | ORAL_TABLET | Freq: Three times a day (TID) | ORAL | 0 refills | Status: DC | PRN

## 2023-02-02 MED ORDER — LIDOCAINE 5 % EX PTCH: 1.0000 | MEDICATED_PATCH | CUTANEOUS | 0 refills | Status: DC

## 2023-02-02 MED ORDER — FENTANYL CITRATE PF 50 MCG/ML IJ SOSY
50.0000 ug | PREFILLED_SYRINGE | INTRAMUSCULAR | Status: AC | PRN
  Administered 2023-02-02 (×2): 50 ug via INTRAVENOUS
  Filled 2023-02-02 (×2): qty 1

## 2023-02-02 NOTE — Discharge Instructions (Signed)
You have a pelvic fracture however this was also seen during your hospital admission from 01/24/2023  I written you for a short course of pain medicine as well as some lidocaine patches  Take as prescribed  Keep following up with physical therapy at your skilled nursing facility

## 2023-02-02 NOTE — ED Triage Notes (Signed)
Pt BIB EMS from [facility], here for L hip pain after a fall 2 days ago. Facility sent her for evaluation due to xray showing hip fracture. Pt reports an increase in pain.

## 2023-02-02 NOTE — ED Provider Notes (Signed)
[place] EMERGENCY DEPARTMENT AT [facility] Provider Note   CSN: [id number] Arrival date & time: 02/02/23  1549    History  Chief Complaint  Patient presents with   Hip Pain    Monica Conway is a 77 y.o. female discharged 2 days ago, after 1-week inpatient stay for disorientation, CVA and fall  She was discharged to a SNF.  While at [facility] patient has been complaining since discharge of left hip and knee pain.  The facility had gotten x-rays which showed a possible hip fx? Per triage note.  Patient denies any additional falls since being discharged.  No numbness or weakness.  She states she used to live at home however is getting therapy currently.  She typically walks with a cane and walker prior to her most recent fall.  No headache, numbness, weakness, abdominal pain, nausea, vomiting, chest pain, back pain.  She has a brother who is her emergency contact.  HPI     Home Medications Prior to Admission medications   Medication Sig Start Date End Date Taking? Authorizing Provider  lidocaine (LIDODERM) 5 % Place 1 patch onto the skin daily. Remove & Discard patch within 12 hours or as directed by MD 02/02/23  Yes Pacer Dorn A, PA-C  oxyCODONE-acetaminophen (PERCOCET/ROXICET) 5-325 MG tablet Take 1 tablet by mouth every 8 (eight) hours as needed for severe pain (pain score 7-10). 02/02/23  Yes Shirlie Enck A, PA-C  acetaminophen (TYLENOL) 500 MG tablet Take 2 tablets (1,000 mg total) by mouth every 8 (eight) hours as needed for mild pain (pain score 1-3). 01/31/23   Rocky Morel, DO  amLODipine (NORVASC) 5 MG tablet Take 1 tablet (5 mg total) by mouth daily. 02/01/23   Rocky Morel, DO  apixaban (ELIQUIS) 5 MG TABS tablet Take 1 tablet (5 mg total) by mouth 2 (two) times daily. 01/31/23   Rocky Morel, DO  Cyanocobalamin (B-12 PO) Take 1 tablet by mouth daily.    [provider]  loperamide (IMODIUM) 2 MG capsule Take 1 capsule (2 mg total) by  mouth daily as needed for diarrhea or loose stools. 01/31/23   Rocky Morel, DO  melatonin 5 MG TABS Take 1 tablet (5 mg total) by mouth at bedtime. 01/31/23   Rocky Morel, DO  metoprolol succinate (TOPROL-XL) 50 MG 24 hr tablet Take 3 tablets (150 mg total) by mouth daily. 02/01/23   Rocky Morel, DO  pantoprazole (PROTONIX) 40 MG tablet Take 40 mg by mouth daily. Patient stated she in not on this medication. 01/25/2023. 11/09/22   [provider]  PROLIA 60 MG/ML SOSY injection Inject 60 mg into the skin every 6 (six) months. 08/08/22   [provider]  QUEtiapine (SEROQUEL) 25 MG tablet Take 1 tablet (25 mg total) by mouth daily as needed (As needed at night for agitation). 01/31/23   Rocky Morel, DO  VENTOLIN HFA 108 (90 Base) MCG/ACT inhaler Inhale 1 puff into the lungs 4 (four) times daily. 10/21/22   [provider]      Allergies    Pollen extract    Review of Systems   Review of Systems  Constitutional: Negative.   HENT: Negative.    Respiratory: Negative.    Cardiovascular: Negative.   Gastrointestinal: Negative.   Genitourinary: Negative.   Musculoskeletal:        Left hip, pelvic and knee pain  Skin: Negative.   Neurological: Negative.   All other systems reviewed and are negative.  Physical Exam Updated Vital Signs BP (!) 148/66   Pulse 72   Temp 99.2 F (37.3 C) (Temporal)   Resp 19   Ht 5\' 2"  (1.575 m)   Wt 53.5 kg   SpO2 95%   BMI 21.58 kg/m  Physical Exam Vitals and nursing note reviewed.  Constitutional:      General: She is not in acute distress.    Appearance: She is well-developed. She is not ill-appearing, toxic-appearing or diaphoretic.  HENT:     Head: Atraumatic.  Eyes:     Pupils: Pupils are equal, round, and reactive to light.  Cardiovascular:     Rate and Rhythm: Normal rate.     Pulses: Normal pulses.          Radial pulses are 2+ on the right side and 2+ on the left side.       Dorsalis pedis pulses  are 2+ on the right side and 2+ on the left side.     Heart sounds: Normal heart sounds.  Pulmonary:     Effort: Pulmonary effort is normal. No respiratory distress.     Breath sounds: Normal breath sounds.  Abdominal:     General: Bowel sounds are normal. There is no distension.     Palpations: Abdomen is soft.  Musculoskeletal:        General: Normal range of motion.     Cervical back: Normal range of motion.     Comments: Diffuse tenderness about left pelvis and hip as well as left knee.  Soft tissue swelling about knee without erythema, warmth.  Skin:    General: Skin is warm and dry.     Capillary Refill: Capillary refill takes less than 2 seconds.  Neurological:     General: No focal deficit present.     Mental Status: She is alert.     Comments: Alert to person, place, hospital, time, 2024, president, Biden Follows commands Equal handgrip bilaterally Intact sensation  Psychiatric:        Mood and Affect: Mood normal.    ED Results / Procedures / Treatments   Labs (all labs ordered are listed, but only abnormal results are displayed) Labs Reviewed  BASIC METABOLIC PANEL - Abnormal; Notable for the following components:      Result Value   CO2 20 (*)    Creatinine, Ser 1.73 (*)    Calcium 8.8 (*)    GFR, Estimated 30 (*)    All other components within normal limits  CBC WITH DIFFERENTIAL/PLATELET - Abnormal; Notable for the following components:   WBC 13.8 (*)    RBC 3.64 (*)    Hemoglobin 10.7 (*)    HCT 35.3 (*)    RDW 16.8 (*)    Platelets 660 (*)    Neutro Abs 10.6 (*)    Monocytes Absolute 1.7 (*)    Abs Immature Granulocytes 0.28 (*)    All other components within normal limits  PROTIME-INR - Abnormal; Notable for the following components:   Prothrombin Time 21.7 (*)    INR 1.9 (*)    All other components within normal limits  TYPE AND SCREEN    EKG EKG Interpretation Date/Time:  Monday February 02 2023 17:41:06 EST Ventricular Rate:  77 PR  Interval:  147 QRS Duration:  95 QT Interval:  360 QTC Calculation: 408 R Axis:   -22  Text Interpretation: Sinus or ectopic atrial rhythm LVH with secondary repolarization abnormality Confirmed by Vonita Moss [phone]) on 02/02/2023 8:06:24 PM  Radiology CT PELVIS WO CONTRAST  Result Date: 02/02/2023 CLINICAL DATA:  Hip trauma, fracture suspected, xray done EXAM: CT PELVIS WITHOUT CONTRAST TECHNIQUE: Multidetector CT imaging of the pelvis was performed following the standard protocol without intravenous contrast. RADIATION DOSE REDUCTION: This exam was performed according to the departmental dose-optimization program which includes automated exposure control, adjustment of the mA and/or kV according to patient size and/or use of iterative reconstruction technique. COMPARISON:  Pelvic and hip XRs, earlier same day and 01/24/2023. CT AP, 02/07/2014. FINDINGS: Urinary Tract: Nondistended urinary bladder. No abnormality visualized. Bowel: Unremarkable visualized pelvic bowel loops. RIGHT hemicolectomy incompletely imaged. Vascular/Lymphatic: Aortic atherosclerosis involving the bifurcation. No enlarged abdominal or pelvic lymph nodes. Reproductive:  Postmenopausal uterus.  No adnexal mass. Other:  None. Musculoskeletal: LEFT total hip arthroplasty, in anatomic alignment. Intact RIGHT pelvic ring. No pubic symphyseal or SI joint diastasis. Comminuted, minimally-displaced LEFT obturator ring fractures, involving the superior and inferior pubic rami. These appear subacute and similar in appearance to recent comparison pelvic XRs (01/24/2023). No extraperitoneal pelvic hematoma. IMPRESSION: Comminuted, minimally-displaced subacute LEFT obturator ring fractures, as above. No extraperitoneal pelvic hematoma, pubic symphyseal or SI joint diastasis. Electronically Signed   By: Roanna Banning M.D.   On: 02/02/2023 20:07   DG Knee Complete 4 Views Left  Addendum Date: 02/02/2023   ADDENDUM REPORT: 02/02/2023 18:27  CLINICAL DATA:  Pain after fall. Pain for 2 weeks. EXAM: LEFT KNEE - COMPLETE 4 VIEW COMPARISON:  01/24/2023 x-ray FINDINGS: Osteopenia. Small osteophytes seen of the patellofemoral joint and medial compartment. Small joint effusion on lateral view. Decreased from previous soft tissue calcifications are seen including chondrocalcinosis. No obvious fracture or dislocation. Scattered vascular calcifications. IMPRESSION: Osteopenia with degenerative changes. Chondrocalcinosis. Small joint effusion of uncertain etiology. Please correlate with history however this has decreasing from previous. Electronically Signed   By: Karen Kays M.D.   On: 02/02/2023 18:27   Result Date: 02/02/2023 CLINICAL DATA:  Pain after fall.  Pain for 2 weeks. EXAM: LEFT KNEE - COMPLETE 4 VIEW COMPARISON:  None Available. FINDINGS: Osteopenia. Small osteophytes seen of the patellofemoral joint and medial compartment. Small joint effusion on lateral view. Soft tissue calcifications are seen including chondrocalcinosis. No obvious fracture or dislocation. Scattered vascular calcifications. IMPRESSION: Osteopenia with degenerative changes. Chondrocalcinosis. Small joint effusion of uncertain etiology. This has a broad differential. Please correlate for any history of trauma or infection. Electronically Signed: By: Karen Kays M.D. On: 02/02/2023 18:23   DG Hip Unilat With Pelvis 2-3 Views Left  Result Date: 02/02/2023 CLINICAL DATA:  Pain. EXAM: DG HIP (WITH OR WITHOUT PELVIS) 3V LEFT COMPARISON:  X-ray 01/24/2023. FINDINGS: Left hip arthroplasty identified with screw fixated acetabular cup and Press-Fit femoral component. Expected alignment. No hardware failure. There is osteopenia. There is subtle fracture involving the left side of the pubic symphysis extending along the inferior pubic ramus and superior pubic ramus. Based on appearance this could be subacute. This was seen previously. Please correlate with history. Slight concentric  joint space loss of the right hip. Mild degenerative changes of the sacroiliac joints and visualized lower lumbar spine. Of note the superior aspect of the iliac crests are clipped off the edge of the film. Overall if there is concern further of new injury with this level of osteopenia, a subtle injury is difficult to completely exclude and if needed additional cross-sectional evaluation. Presumed vascular calcifications along the pelvis. IMPRESSION: Persistent fracture involving the left side of the pubic symphysis and adjacent ramus. Left hip  arthroplasty. Degenerative changes and osteopenia Electronically Signed   By: Karen Kays M.D.   On: 02/02/2023 18:26    Procedures Procedures    Medications Ordered in ED Medications  fentaNYL (SUBLIMAZE) injection 50 mcg (50 mcg Intravenous Given 02/02/23 2029)   ED Course/ Medical Decision Making/ A&P   77 year old recently discharged 2 days ago after CVA, fall subsequently discharged to SNF here for evaluation of left hip pain.  Apparently facility had gotten x-rays done which showed a possible hip fracture according to the nursing triage note however I reviewed patient's x-rays and it shows a pubic rami fracture.  She states she still has pain.  She denies any additional falls since being discharged.  I reviewed her prior hospitalization.  Her x-ray of her hip and pelvis at that time did show a subacute fracture left pubic symphysis as well as left superior pubic rami she was seen by PT, OT and discharged to SNF.  Will plan on checking basic x-rays, repeat imaging to ensure no occult fracture pain management and reassess  Labs and imaging personally viewed and interpreted:  CBC leukocytosis 13.8, hemoglobin 10.7 Metabolic panel creatinine 1.73 X-ray left hip pelvis with persistent fracture through left pubic symphysis X-ray left knee with small effusion, decreased from previous  She still has some mild pain.  Fentanyl given.  Will plan on CT pelvis.   Discussed x-ray of her left knee which has improved from her prior imaging.  She is no overlying redness or warmth she is able to flex and extend.  I low suspicion for septic joint, gout, hemarthrosis, occult fracture.  CT shows subacute Comminuted, minimally-displaced subacute LEFT obturator ring fractures- Similar to imaging from 12/28/22  Discussed results with patient.  She was seen by PT, OT during her prior hospitalization and was discharged to SNF.  Do not feel she needs addition inpatient admission at this time given no new falls, imaging is subacute.  Discussed with attending, Dr. Eloise Harman who is agreeable with treatment, plan and disposition  The patient has been appropriately medically screened and/or stabilized in the ED. I have low suspicion for any other emergent medical condition which would require further screening, evaluation or treatment in the ED or require inpatient management.  Patient is hemodynamically stable and in no acute distress.  Patient able to ambulate in department prior to ED.  Evaluation does not show acute pathology that would require ongoing or additional emergent interventions while in the emergency department or further inpatient treatment.  I have discussed the diagnosis with the patient and answered all questions.  Pain is been managed while in the emergency department and patient has no further complaints prior to discharge.  Patient is comfortable with plan discussed in room and is stable for discharge at this time.  I have discussed strict return precautions for returning to the emergency department.  Patient was encouraged to follow-up with PCP/specialist refer to at discharge.                                Medical Decision Making Amount and/or Complexity of Data Reviewed Independent Historian: EMS External Data Reviewed: labs, radiology, ECG and notes. Labs: ordered. Decision-making details documented in ED Course. Radiology: ordered and independent  interpretation performed. Decision-making details documented in ED Course. ECG/medicine tests: ordered and independent interpretation performed. Decision-making details documented in ED Course.  Risk OTC drugs. Prescription drug management. Parenteral controlled substances. Decision regarding hospitalization.  Diagnosis or treatment significantly limited by social determinants of health.          Final Clinical Impression(s) / ED Diagnoses Final diagnoses:  Closed fracture of multiple rami of left pubis with delayed healing, subsequent encounter  Fall, subsequent encounter    Rx / DC Orders ED Discharge Orders          Ordered    oxyCODONE-acetaminophen (PERCOCET/ROXICET) 5-325 MG tablet  Every 8 hours PRN        02/02/23 2046    lidocaine (LIDODERM) 5 %  Every 24 hours        02/02/23 2046              Jung Ingerson A, PA-C 02/02/23 2120    Rondel Baton, MD 02/04/23 1057

## 2023-02-02 NOTE — ED Notes (Signed)
[facility] notified on patient's discharge / report given . [facility] notified by secretary for transport back to SNF .

## 2023-02-03 NOTE — ED Notes (Signed)
Noticed patient was wet. Offered to change her but patient refused on several occasions. Patient states she will take care of it.

## 2023-02-20 NOTE — Progress Notes (Signed)
Patient most likely had Non infectious SIRS with acute organ dysfunction of AKI.

## 2023-04-03 ENCOUNTER — Other Ambulatory Visit: Payer: Self-pay

## 2023-04-03 ENCOUNTER — Emergency Department (HOSPITAL_COMMUNITY): Payer: Medicare HMO

## 2023-04-03 ENCOUNTER — Inpatient Hospital Stay (HOSPITAL_COMMUNITY)
Admission: EM | Admit: 2023-04-03 | Discharge: 2023-04-09 | DRG: 377 | Disposition: A | Discharge status: Skilled Nursing Facility | Payer: Medicare HMO | Attending: Internal Medicine | Admitting: Internal Medicine

## 2023-04-03 ENCOUNTER — Inpatient Hospital Stay (HOSPITAL_COMMUNITY): Payer: Medicare HMO

## 2023-04-03 ENCOUNTER — Encounter (HOSPITAL_COMMUNITY): Payer: Self-pay

## 2023-04-03 DIAGNOSIS — Z8249 Family history of ischemic heart disease and other diseases of the circulatory system: Secondary | ICD-10-CM | POA: Diagnosis not present

## 2023-04-03 DIAGNOSIS — R578 Other shock: Secondary | ICD-10-CM | POA: Diagnosis present

## 2023-04-03 DIAGNOSIS — Z9109 Other allergy status, other than to drugs and biological substances: Secondary | ICD-10-CM

## 2023-04-03 DIAGNOSIS — K5130 Ulcerative (chronic) rectosigmoiditis without complications: Secondary | ICD-10-CM | POA: Diagnosis present

## 2023-04-03 DIAGNOSIS — M810 Age-related osteoporosis without current pathological fracture: Secondary | ICD-10-CM | POA: Diagnosis present

## 2023-04-03 DIAGNOSIS — Z888 Allergy status to other drugs, medicaments and biological substances status: Secondary | ICD-10-CM

## 2023-04-03 DIAGNOSIS — K2960 Other gastritis without bleeding: Secondary | ICD-10-CM | POA: Diagnosis present

## 2023-04-03 DIAGNOSIS — Z8673 Personal history of transient ischemic attack (TIA), and cerebral infarction without residual deficits: Secondary | ICD-10-CM

## 2023-04-03 DIAGNOSIS — J45909 Unspecified asthma, uncomplicated: Secondary | ICD-10-CM | POA: Diagnosis present

## 2023-04-03 DIAGNOSIS — R571 Hypovolemic shock: Secondary | ICD-10-CM | POA: Diagnosis not present

## 2023-04-03 DIAGNOSIS — K264 Chronic or unspecified duodenal ulcer with hemorrhage: Secondary | ICD-10-CM | POA: Diagnosis present

## 2023-04-03 DIAGNOSIS — K529 Noninfective gastroenteritis and colitis, unspecified: Secondary | ICD-10-CM | POA: Diagnosis not present

## 2023-04-03 DIAGNOSIS — D62 Acute posthemorrhagic anemia: Secondary | ICD-10-CM | POA: Diagnosis present

## 2023-04-03 DIAGNOSIS — H5702 Anisocoria: Secondary | ICD-10-CM

## 2023-04-03 DIAGNOSIS — K859 Acute pancreatitis without necrosis or infection, unspecified: Secondary | ICD-10-CM | POA: Diagnosis present

## 2023-04-03 DIAGNOSIS — I480 Paroxysmal atrial fibrillation: Secondary | ICD-10-CM | POA: Diagnosis present

## 2023-04-03 DIAGNOSIS — F419 Anxiety disorder, unspecified: Secondary | ICD-10-CM | POA: Diagnosis present

## 2023-04-03 DIAGNOSIS — Z9049 Acquired absence of other specified parts of digestive tract: Secondary | ICD-10-CM

## 2023-04-03 DIAGNOSIS — K254 Chronic or unspecified gastric ulcer with hemorrhage: Principal | ICD-10-CM | POA: Diagnosis present

## 2023-04-03 DIAGNOSIS — E872 Acidosis, unspecified: Secondary | ICD-10-CM | POA: Diagnosis present

## 2023-04-03 DIAGNOSIS — E876 Hypokalemia: Secondary | ICD-10-CM

## 2023-04-03 DIAGNOSIS — K922 Gastrointestinal hemorrhage, unspecified: Secondary | ICD-10-CM | POA: Diagnosis not present

## 2023-04-03 DIAGNOSIS — B9620 Unspecified Escherichia coli [E. coli] as the cause of diseases classified elsewhere: Secondary | ICD-10-CM | POA: Diagnosis not present

## 2023-04-03 DIAGNOSIS — I2510 Atherosclerotic heart disease of native coronary artery without angina pectoris: Secondary | ICD-10-CM | POA: Diagnosis present

## 2023-04-03 DIAGNOSIS — F05 Delirium due to known physiological condition: Secondary | ICD-10-CM | POA: Diagnosis not present

## 2023-04-03 DIAGNOSIS — R011 Cardiac murmur, unspecified: Secondary | ICD-10-CM | POA: Diagnosis present

## 2023-04-03 DIAGNOSIS — R197 Diarrhea, unspecified: Secondary | ICD-10-CM | POA: Diagnosis present

## 2023-04-03 DIAGNOSIS — K250 Acute gastric ulcer with hemorrhage: Secondary | ICD-10-CM | POA: Diagnosis not present

## 2023-04-03 DIAGNOSIS — Z7901 Long term (current) use of anticoagulants: Secondary | ICD-10-CM | POA: Diagnosis not present

## 2023-04-03 DIAGNOSIS — Z79899 Other long term (current) drug therapy: Secondary | ICD-10-CM | POA: Diagnosis not present

## 2023-04-03 DIAGNOSIS — K449 Diaphragmatic hernia without obstruction or gangrene: Secondary | ICD-10-CM | POA: Diagnosis present

## 2023-04-03 DIAGNOSIS — N390 Urinary tract infection, site not specified: Secondary | ICD-10-CM | POA: Diagnosis present

## 2023-04-03 DIAGNOSIS — I679 Cerebrovascular disease, unspecified: Secondary | ICD-10-CM | POA: Diagnosis not present

## 2023-04-03 DIAGNOSIS — Z66 Do not resuscitate: Secondary | ICD-10-CM | POA: Diagnosis present

## 2023-04-03 DIAGNOSIS — Z96642 Presence of left artificial hip joint: Secondary | ICD-10-CM | POA: Diagnosis present

## 2023-04-03 DIAGNOSIS — K8020 Calculus of gallbladder without cholecystitis without obstruction: Secondary | ICD-10-CM | POA: Diagnosis present

## 2023-04-03 DIAGNOSIS — N179 Acute kidney failure, unspecified: Secondary | ICD-10-CM | POA: Diagnosis present

## 2023-04-03 DIAGNOSIS — M1990 Unspecified osteoarthritis, unspecified site: Secondary | ICD-10-CM | POA: Diagnosis present

## 2023-04-03 DIAGNOSIS — I10 Essential (primary) hypertension: Secondary | ICD-10-CM | POA: Diagnosis present

## 2023-04-03 DIAGNOSIS — R0602 Shortness of breath: Secondary | ICD-10-CM | POA: Diagnosis present

## 2023-04-03 DIAGNOSIS — K259 Gastric ulcer, unspecified as acute or chronic, without hemorrhage or perforation: Secondary | ICD-10-CM

## 2023-04-03 DIAGNOSIS — K2270 Barrett's esophagus without dysplasia: Secondary | ICD-10-CM | POA: Diagnosis present

## 2023-04-03 DIAGNOSIS — G9341 Metabolic encephalopathy: Secondary | ICD-10-CM | POA: Diagnosis not present

## 2023-04-03 LAB — LACTIC ACID, PLASMA
Lactic Acid, Venous: 1.7 mmol/L (ref 0.5–1.9)
Lactic Acid, Venous: 1.1 mmol/L (ref 0.5–1.9)

## 2023-04-03 LAB — CBC
HCT: 29.9 % — ABNORMAL LOW (ref 36.0–46.0)
HCT: 30.9 % — ABNORMAL LOW (ref 36.0–46.0)
Hemoglobin: 9.7 g/dL — ABNORMAL LOW (ref 12.0–15.0)
Hemoglobin: 9.9 g/dL — ABNORMAL LOW (ref 12.0–15.0)
MCH: 29 pg (ref 26.0–34.0)
MCH: 29 pg (ref 26.0–34.0)
MCHC: 32.4 g/dL (ref 30.0–36.0)
MCHC: 32 g/dL (ref 30.0–36.0)
MCV: 89.3 fL (ref 80.0–100.0)
MCV: 90.6 fL (ref 80.0–100.0)
Platelets: 164 10*3/uL (ref 150–400)
Platelets: 167 10*3/uL (ref 150–400)
RBC: 3.35 MIL/uL — ABNORMAL LOW (ref 3.87–5.11)
RBC: 3.41 MIL/uL — ABNORMAL LOW (ref 3.87–5.11)
RDW: 15.6 % — ABNORMAL HIGH (ref 11.5–15.5)
RDW: 15.9 % — ABNORMAL HIGH (ref 11.5–15.5)
WBC: 7.6 10*3/uL (ref 4.0–10.5)
WBC: 10 10*3/uL (ref 4.0–10.5)
nRBC: 0.7 % — ABNORMAL HIGH (ref 0.0–0.2)
nRBC: 0.7 % — ABNORMAL HIGH (ref 0.0–0.2)

## 2023-04-03 LAB — URINALYSIS, ROUTINE W REFLEX MICROSCOPIC
Bilirubin Urine: NEGATIVE
Glucose, UA: NEGATIVE mg/dL
Ketones, ur: NEGATIVE mg/dL
Nitrite: POSITIVE — AB
Protein, ur: NEGATIVE mg/dL
Specific Gravity, Urine: 1.014 (ref 1.005–1.030)
WBC, UA: >50 WBC/hpf (ref 0–5)
pH: 5 (ref 5.0–8.0)

## 2023-04-03 LAB — CBC WITH DIFFERENTIAL/PLATELET
Abs Immature Granulocytes: 0.04 10*3/uL (ref 0.00–0.07)
Basophils Absolute: 0 10*3/uL (ref 0.0–0.1)
Basophils Relative: 0 %
Eosinophils Absolute: 0 10*3/uL (ref 0.0–0.5)
Eosinophils Relative: 0 %
HCT: 16.6 % — ABNORMAL LOW (ref 36.0–46.0)
Hemoglobin: 4.8 g/dL — CL (ref 12.0–15.0)
Immature Granulocytes: 1 %
Lymphocytes Relative: 15 %
Lymphs Abs: 1.3 10*3/uL (ref 0.7–4.0)
MCH: 27.1 pg (ref 26.0–34.0)
MCHC: 28.9 g/dL — ABNORMAL LOW (ref 30.0–36.0)
MCV: 93.8 fL (ref 80.0–100.0)
Monocytes Absolute: 0.3 10*3/uL (ref 0.1–1.0)
Monocytes Relative: 4 %
Neutro Abs: 6.9 10*3/uL (ref 1.7–7.7)
Neutrophils Relative %: 80 %
Platelets: 253 10*3/uL (ref 150–400)
RBC: 1.77 MIL/uL — ABNORMAL LOW (ref 3.87–5.11)
RDW: 20.5 % — ABNORMAL HIGH (ref 11.5–15.5)
WBC: 8.6 10*3/uL (ref 4.0–10.5)
nRBC: 0.5 % — ABNORMAL HIGH (ref 0.0–0.2)

## 2023-04-03 LAB — PREPARE RBC (CROSSMATCH)

## 2023-04-03 LAB — COMPREHENSIVE METABOLIC PANEL
ALT: 10 U/L (ref 0–44)
AST: 13 U/L — ABNORMAL LOW (ref 15–41)
Albumin: 2.6 g/dL — ABNORMAL LOW (ref 3.5–5.0)
Alkaline Phosphatase: 41 U/L (ref 38–126)
Anion gap: 8 (ref 5–15)
BUN: 69 mg/dL — ABNORMAL HIGH (ref 8–23)
CO2: 17 mmol/L — ABNORMAL LOW (ref 22–32)
Calcium: 7.7 mg/dL — ABNORMAL LOW (ref 8.9–10.3)
Chloride: 113 mmol/L — ABNORMAL HIGH (ref 98–111)
Creatinine, Ser: 1.17 mg/dL — ABNORMAL HIGH (ref 0.44–1.00)
GFR, Estimated: 48 mL/min — ABNORMAL LOW (ref >60)
Glucose, Bld: 114 mg/dL — ABNORMAL HIGH (ref 70–99)
Potassium: 2.5 mmol/L — CL (ref 3.5–5.1)
Sodium: 138 mmol/L (ref 135–145)
Total Bilirubin: 0.7 mg/dL (ref 0.0–1.2)
Total Protein: 5.3 g/dL — ABNORMAL LOW (ref 6.5–8.1)

## 2023-04-03 LAB — POC OCCULT BLOOD, ED: Fecal Occult Bld: POSITIVE — AB

## 2023-04-03 LAB — BASIC METABOLIC PANEL
Anion gap: 8 (ref 5–15)
BUN: 62 mg/dL — ABNORMAL HIGH (ref 8–23)
CO2: 15 mmol/L — ABNORMAL LOW (ref 22–32)
Calcium: 7 mg/dL — ABNORMAL LOW (ref 8.9–10.3)
Chloride: 118 mmol/L — ABNORMAL HIGH (ref 98–111)
Creatinine, Ser: 0.99 mg/dL (ref 0.44–1.00)
GFR, Estimated: 59 mL/min — ABNORMAL LOW (ref >60)
Glucose, Bld: 139 mg/dL — ABNORMAL HIGH (ref 70–99)
Potassium: 3.1 mmol/L — ABNORMAL LOW (ref 3.5–5.1)
Sodium: 141 mmol/L (ref 135–145)

## 2023-04-03 LAB — MRSA NEXT GEN BY PCR, NASAL: MRSA by PCR Next Gen: NOT DETECTED

## 2023-04-03 LAB — LIPASE, BLOOD: Lipase: 27 U/L (ref 11–51)

## 2023-04-03 LAB — MAGNESIUM: Magnesium: 1.3 mg/dL — ABNORMAL LOW (ref 1.7–2.4)

## 2023-04-03 MED ORDER — PHENAZOPYRIDINE HCL 100 MG PO TABS
100.0000 mg | ORAL_TABLET | Freq: Once | ORAL | Status: AC
  Administered 2023-04-03: 100 mg via ORAL
  Filled 2023-04-03: qty 1

## 2023-04-03 MED ORDER — SODIUM CHLORIDE 0.9 % IV BOLUS
500.0000 mL | Freq: Once | INTRAVENOUS | Status: AC
  Administered 2023-04-03: 500 mL via INTRAVENOUS

## 2023-04-03 MED ORDER — PANTOPRAZOLE SODIUM 40 MG IV SOLR
40.0000 mg | INTRAVENOUS | Status: AC
  Administered 2023-04-03: 40 mg via INTRAVENOUS
  Filled 2023-04-03 (×2): qty 10

## 2023-04-03 MED ORDER — SODIUM CHLORIDE 0.9% IV SOLUTION
Freq: Once | INTRAVENOUS | Status: AC
Start: 2023-04-03 — End: 2023-04-03

## 2023-04-03 MED ORDER — CHLORHEXIDINE GLUCONATE CLOTH 2 % EX PADS
6.0000 | MEDICATED_PAD | Freq: Every day | CUTANEOUS | Status: DC
  Administered 2023-04-03 – 2023-04-07 (×5): 6 via TOPICAL

## 2023-04-03 MED ORDER — NOREPINEPHRINE 4 MG/250ML-% IV SOLN
0.0000 ug/min | INTRAVENOUS | Status: DC
  Administered 2023-04-03: 10 ug/min via INTRAVENOUS
  Filled 2023-04-03: qty 250

## 2023-04-03 MED ORDER — NOREPINEPHRINE 4 MG/250ML-% IV SOLN
2.0000 ug/min | INTRAVENOUS | Status: DC
  Administered 2023-04-03: 2 ug/min via INTRAVENOUS

## 2023-04-03 MED ORDER — SODIUM CHLORIDE 0.9% IV SOLUTION: Freq: Once | INTRAVENOUS | Status: AC

## 2023-04-03 MED ORDER — SODIUM CHLORIDE 0.9 % IV SOLN
2.0000 g | Freq: Two times a day (BID) | INTRAVENOUS | Status: DC
  Administered 2023-04-03: 2 g via INTRAVENOUS
  Filled 2023-04-03: qty 12.5

## 2023-04-03 MED ORDER — ALBUTEROL SULFATE (2.5 MG/3ML) 0.083% IN NEBU: 2.5000 mg | INHALATION_SOLUTION | RESPIRATORY_TRACT | Status: DC | PRN

## 2023-04-03 MED ORDER — PANTOPRAZOLE SODIUM 40 MG IV SOLR
40.0000 mg | Freq: Two times a day (BID) | INTRAVENOUS | Status: DC
Start: 2023-04-04 — End: 2023-04-06
  Administered 2023-04-03 – 2023-04-06 (×7): 40 mg via INTRAVENOUS
  Filled 2023-04-03 (×6): qty 10

## 2023-04-03 MED ORDER — POTASSIUM CHLORIDE CRYS ER 20 MEQ PO TBCR: 60.0000 meq | EXTENDED_RELEASE_TABLET | Freq: Once | ORAL | Status: DC

## 2023-04-03 MED ORDER — PROTHROMBIN COMPLEX CONC HUMAN 500 UNITS IV KIT
1609.0000 [IU] | PACK | Status: AC
  Administered 2023-04-03: 1609 [IU] via INTRAVENOUS
  Filled 2023-04-03: qty 1609

## 2023-04-03 MED ORDER — POTASSIUM CHLORIDE 10 MEQ/100ML IV SOLN
10.0000 meq | INTRAVENOUS | Status: AC
  Administered 2023-04-03 (×2): 10 meq via INTRAVENOUS
  Filled 2023-04-03 (×2): qty 100

## 2023-04-03 MED ORDER — SODIUM CHLORIDE 0.9 % IV SOLN
2.0000 g | Freq: Two times a day (BID) | INTRAVENOUS | Status: DC
Start: 2023-04-03 — End: 2023-04-03

## 2023-04-03 MED ORDER — SODIUM CHLORIDE 0.9 % IV SOLN
1.0000 g | INTRAVENOUS | Status: AC
  Administered 2023-04-04 – 2023-04-05 (×2): 1 g via INTRAVENOUS
  Filled 2023-04-03 (×2): qty 10

## 2023-04-03 MED ORDER — CALCIUM GLUCONATE-NACL 1-0.675 GM/50ML-% IV SOLN
1.0000 g | Freq: Once | INTRAVENOUS | Status: AC
  Administered 2023-04-03: 1000 mg via INTRAVENOUS
  Filled 2023-04-03: qty 50

## 2023-04-03 MED ORDER — PROTHROMBIN COMPLEX CONC HUMAN 500 UNITS IV KIT
1609.0000 [IU] | PACK | Status: DC
  Filled 2023-04-03: qty 609

## 2023-04-03 MED ORDER — PROTHROMBIN COMPLEX CONC HUMAN 500 UNITS IV KIT
1103.0000 [IU] | PACK | Status: AC
  Administered 2023-04-03: 1103 [IU] via INTRAVENOUS
  Filled 2023-04-03: qty 1103

## 2023-04-03 MED ORDER — VANCOMYCIN HCL IN DEXTROSE 1-5 GM/200ML-% IV SOLN
1000.0000 mg | INTRAVENOUS | Status: DC
Start: 2023-04-03 — End: 2023-04-03
  Administered 2023-04-03: 1000 mg via INTRAVENOUS
  Filled 2023-04-03: qty 200

## 2023-04-03 MED ORDER — SODIUM CHLORIDE 0.9 % IV SOLN
250.0000 mL | INTRAVENOUS | Status: AC
  Administered 2023-04-03 (×2): 250 mL via INTRAVENOUS

## 2023-04-03 MED ORDER — POTASSIUM CHLORIDE 10 MEQ/100ML IV SOLN
10.0000 meq | INTRAVENOUS | Status: AC
  Administered 2023-04-03 (×4): 10 meq via INTRAVENOUS
  Filled 2023-04-03 (×4): qty 100

## 2023-04-03 MED ORDER — MAGNESIUM SULFATE 2 GM/50ML IV SOLN
2.0000 g | Freq: Once | INTRAVENOUS | Status: AC
  Administered 2023-04-03: 2 g via INTRAVENOUS
  Filled 2023-04-03: qty 50

## 2023-04-03 MED ORDER — IOHEXOL 350 MG/ML SOLN
80.0000 mL | Freq: Once | INTRAVENOUS | Status: AC | PRN
  Administered 2023-04-03: 80 mL via INTRAVENOUS

## 2023-04-03 MED ORDER — SODIUM CHLORIDE 0.9 % IV SOLN
1.0000 g | Freq: Once | INTRAVENOUS | Status: AC
  Administered 2023-04-03: 1 g via INTRAVENOUS
  Filled 2023-04-03: qty 10

## 2023-04-03 NOTE — ED Provider Notes (Signed)
 [facility] AT [facility] Provider Note   CSN: [id number] Arrival date & time: 04/03/23  1211     History  Chief Complaint  Patient presents with   Melena   Dysuria    Monica Conway is a 78 y.o. female.  78 year old female with a history of stroke, atrial fibrillation on Eliquis , and Barrett's esophagus who presents emergency department with generalized weakness, dysuria, dark stools.  Patient reports that over the past week she has had dysuria and foul-smelling urine.  Also reports dark urine.  Denies fevers.  Today started having black stools.  And decided to come into the emergency department for evaluation.  Lives at home.  No endoscopy in the past that she knows of.  Denies heavy NSAID use or alcohol  or liver disease. Last took her eliquis  at 0900.        Home Medications Prior to Admission medications   Medication Sig Start Date End Date Taking? Authorizing Provider  amLODipine  (NORVASC ) 5 MG tablet Take 1 tablet (5 mg total) by mouth daily. Patient taking differently: Take 5 mg by mouth in the morning. 02/01/23  Yes Jolaine Pac, DO  apixaban  (ELIQUIS ) 5 MG TABS tablet Take 1 tablet (5 mg total) by mouth 2 (two) times daily. 01/31/23  Yes Jolaine Pac, DO  loperamide  (IMODIUM ) 2 MG capsule Take 1 capsule (2 mg total) by mouth daily as needed for diarrhea or loose stools. 01/31/23  Yes Jolaine Pac, DO  metoprolol  succinate (TOPROL -XL) 50 MG 24 hr tablet Take 3 tablets (150 mg total) by mouth daily. Patient taking differently: Take 150 mg by mouth in the morning. 02/01/23  Yes Jolaine Pac, DO  oxyCODONE  (OXY IR/ROXICODONE ) 5 MG immediate release tablet Take 2.5 mg by mouth 2 (two) times daily as needed for moderate pain (pain score 4-6) or severe pain (pain score 7-10).   Yes [provider]  pantoprazole  (PROTONIX ) 40 MG tablet Take 40 mg by mouth daily before breakfast. 11/09/22  Yes [provider]  QUEtiapine   (SEROQUEL ) 25 MG tablet Take 1 tablet (25 mg total) by mouth daily as needed (As needed at night for agitation). Patient taking differently: Take 25 mg by mouth at bedtime as needed (for agitation). 01/31/23  Yes Jolaine Pac, DO  TYLENOL  8 HOUR 650 MG CR tablet Take 650-1,300 mg by mouth every 8 (eight) hours as needed for pain.   Yes [provider]  VENTOLIN  HFA 108 (90 Base) MCG/ACT inhaler Inhale 1 puff into the lungs See admin instructions. Inhale 1 puff into the lungs ONE TO FOUR TIMES a day as needed for shortness of breath or wheezing 10/21/22  Yes [provider]  acetaminophen  (TYLENOL ) 500 MG tablet Take 2 tablets (1,000 mg total) by mouth every 8 (eight) hours as needed for mild pain (pain score 1-3). Patient not taking: Reported on 04/03/2023 01/31/23   Jolaine Pac, DO  Cyanocobalamin (B-12 PO) Take 1 tablet by mouth daily.    [provider]  lidocaine  (LIDODERM ) 5 % Place 1 patch onto the skin daily. Remove & Discard patch within 12 hours or as directed by MD 02/02/23   Henderly, Britni A, PA-C  melatonin 5 MG TABS Take 1 tablet (5 mg total) by mouth at bedtime. 01/31/23   Jolaine Pac, DO  oxyCODONE -acetaminophen  (PERCOCET/ROXICET) 5-325 MG tablet Take 1 tablet by mouth every 8 (eight) hours as needed for severe pain (pain score 7-10). Patient not taking: Reported on 04/03/2023 02/02/23   Henderly,  Britni A, PA-C      Allergies    Pollen extract and Amlodipine -atorvastatin    Review of Systems   Review of Systems  Physical Exam Updated Vital Signs BP (!) 151/66   Pulse 68   Temp 98.7 F (37.1 C) (Oral)   Resp 19   Ht 5' 2 (1.575 m)   Wt 49 kg   SpO2 99%   BMI 19.76 kg/m  Physical Exam Vitals and nursing note reviewed.  Constitutional:      General: She is in acute distress.     Appearance: She is well-developed. She is ill-appearing.     Comments: Pale appearing  HENT:     Head: Normocephalic and atraumatic.     Right Ear: External  ear normal.     Left Ear: External ear normal.     Nose: Nose normal.  Eyes:     Extraocular Movements: Extraocular movements intact.     Conjunctiva/sclera: Conjunctivae normal.     Pupils: Pupils are equal, round, and reactive to light.  Cardiovascular:     Rate and Rhythm: Normal rate. Rhythm irregular.     Heart sounds: No murmur heard. Pulmonary:     Effort: Pulmonary effort is normal. No respiratory distress.     Breath sounds: Normal breath sounds.  Abdominal:     General: Abdomen is flat. There is no distension.     Palpations: Abdomen is soft. There is no mass.     Tenderness: There is no abdominal tenderness. There is no guarding.  Genitourinary:    Comments: Chaperoned by patient's RN alyssa. No external hemorrhoids or fissures noted on external inspection. No internal masses or hemorroids noted. Normal rectal tone.  Melena noted  Musculoskeletal:     Cervical back: Normal range of motion and neck supple.     Right lower leg: Edema present.     Left lower leg: Edema present.  Skin:    General: Skin is warm and dry.  Neurological:     Mental Status: She is alert and oriented to person, place, and time. Mental status is at baseline.  Psychiatric:        Mood and Affect: Mood normal.     ED Results / Procedures / Treatments   Labs (all labs ordered are listed, but only abnormal results are displayed) Labs Reviewed  CBC WITH DIFFERENTIAL/PLATELET - Abnormal; Notable for the following components:      Result Value   RBC 1.77 (*)    Hemoglobin 4.8 (*)    HCT 16.6 (*)    MCHC 28.9 (*)    RDW 20.5 (*)    nRBC 0.5 (*)    All other components within normal limits  COMPREHENSIVE METABOLIC PANEL - Abnormal; Notable for the following components:   Potassium 2.5 (*)    Chloride 113 (*)    CO2 17 (*)    Glucose, Bld 114 (*)    BUN 69 (*)    Creatinine, Ser 1.17 (*)    Calcium  7.7 (*)    Total Protein 5.3 (*)    Albumin 2.6 (*)    AST 13 (*)    GFR, Estimated 48  (*)    All other components within normal limits  URINALYSIS, ROUTINE W REFLEX MICROSCOPIC - Abnormal; Notable for the following components:   APPearance CLOUDY (*)    Hgb urine dipstick SMALL (*)    Nitrite POSITIVE (*)    Leukocytes,Ua LARGE (*)    Bacteria, UA RARE (*)  All other components within normal limits  MAGNESIUM  - Abnormal; Notable for the following components:   Magnesium  1.3 (*)    All other components within normal limits  CBC - Abnormal; Notable for the following components:   RBC 3.35 (*)    Hemoglobin 9.7 (*)    HCT 29.9 (*)    RDW 15.6 (*)    nRBC 0.7 (*)    All other components within normal limits  BASIC METABOLIC PANEL - Abnormal; Notable for the following components:   Potassium 3.1 (*)    Chloride 118 (*)    CO2 15 (*)    Glucose, Bld 139 (*)    BUN 62 (*)    Calcium  7.0 (*)    GFR, Estimated 59 (*)    All other components within normal limits  RENAL FUNCTION PANEL - Abnormal; Notable for the following components:   Potassium 3.0 (*)    Chloride 118 (*)    CO2 14 (*)    BUN 47 (*)    Calcium  7.3 (*)    Albumin 2.2 (*)    All other components within normal limits  CBC - Abnormal; Notable for the following components:   RBC 3.41 (*)    Hemoglobin 9.9 (*)    HCT 30.9 (*)    RDW 15.9 (*)    nRBC 0.7 (*)    All other components within normal limits  CBC - Abnormal; Notable for the following components:   RBC 3.11 (*)    Hemoglobin 8.9 (*)    HCT 27.9 (*)    RDW 16.9 (*)    nRBC 0.9 (*)    All other components within normal limits  MAGNESIUM  - Abnormal; Notable for the following components:   Magnesium  2.6 (*)    All other components within normal limits  POC OCCULT BLOOD, ED - Abnormal; Notable for the following components:   Fecal Occult Bld POSITIVE (*)    All other components within normal limits  CULTURE, BLOOD (ROUTINE X 2)  CULTURE, BLOOD (ROUTINE X 2)  MRSA NEXT GEN BY PCR, NASAL  URINE CULTURE  LACTIC ACID, PLASMA  LACTIC  ACID, PLASMA  LIPASE, BLOOD  CBC  TYPE AND SCREEN  PREPARE RBC (CROSSMATCH)  PREPARE RBC (CROSSMATCH)    EKG EKG Interpretation Date/Time:  Friday April 03 2023 12:20:31 EST Ventricular Rate:  77 PR Interval:  186 QRS Duration:  98 QT Interval:  397 QTC Calculation: 450 R Axis:   -26  Text Interpretation: Sinus or ectopic atrial rhythm Borderline left axis deviation Low voltage, precordial leads Abnormal R-wave progression, early transition Repol abnrm suggests ischemia, anterolateral Confirmed by Yolande Charleston [phone]) on 04/03/2023 5:22:24 PM  Radiology CT ANGIO GI BLEED Result Date: 04/03/2023 CLINICAL DATA:  Melena, concern for upper GI bleed, hypotension EXAM: CTA ABDOMEN AND PELVIS WITHOUT AND WITH CONTRAST TECHNIQUE: Multidetector CT imaging of the abdomen and pelvis was performed using the standard protocol during bolus administration of intravenous contrast. Multiplanar reconstructed images and MIPs were obtained and reviewed to evaluate the vascular anatomy. RADIATION DOSE REDUCTION: This exam was performed according to the departmental dose-optimization program which includes automated exposure control, adjustment of the mA and/or kV according to patient size and/or use of iterative reconstruction technique. CONTRAST:  80mL OMNIPAQUE  IOHEXOL  350 MG/ML SOLN COMPARISON:  CT pelvis, 02/02/2023 FINDINGS: VASCULAR Normal contour and caliber of the abdominal aorta. Moderate mixed calcific atherosclerosis. No evidence of aneurysm, dissection, or other acute aortic pathology. Standard branching pattern of the abdominal  aorta with solitary bilateral renal arteries. Review of the MIP images confirms the above findings. NON-VASCULAR Lower Chest: No acute findings. Large hiatal hernia with nearly complete intrathoracic position of the stomach. Coronary artery calcifications. Hepatobiliary: No solid liver abnormality is seen. Gallstones. No gallbladder wall thickening, or biliary dilatation.  Pancreas: Diffuse inflammatory fat stranding about the pancreas. No pancreatic parenchymal necrosis or acute pancreatic fluid collection. Spleen: Normal in size without significant abnormality. Adrenals/Urinary Tract: Adrenal glands are unremarkable. Kidneys are normal, without renal calculi, solid lesion, or hydronephrosis. Bladder is unremarkable. Stomach/Bowel: Stomach is within normal limits. Descending duodenal diverticulum. Evidence of partial right hemicolectomy and ileocolic anastomosis. Diffuse mucosal hyperenhancement of the sigmoid colon and rectum (series 12, image 466). The colon is fluid-filled to the rectum. No directly visualized intraluminal contrast enhancement or other findings to specifically localize GI bleeding. Lymphatic: No enlarged abdominal or pelvic lymph nodes. Reproductive: No mass or other significant abnormality. Other: No abdominal wall hernia or abnormality. No ascites. Musculoskeletal: Subacute, partially callused fractures of the left pubic rami, seen acutely on examination dated 02/02/2023 (series 6, image 170). Status post left hip total arthroplasty. IMPRESSION: 1. No directly visualized intraluminal contrast enhancement or other findings to specifically localize GI bleeding. 2. Diffuse mucosal hyperenhancement of the sigmoid colon and rectum, consistent with nonspecific infectious or inflammatory proctocolitis. The colon is fluid-filled to the rectum in keeping with diarrheal illness. 3. Diffuse inflammatory fat stranding about the pancreas, consistent with acute pancreatitis. No pancreatic parenchymal necrosis or acute pancreatic fluid collection. 4. Evidence of partial right hemicolectomy and ileocolic anastomosis. 5. Large hiatal hernia with nearly complete intrathoracic position of the stomach. 6. Cholelithiasis. 7. Subacute, partially callused fractures of the left pubic rami, seen acutely on examination dated 02/02/2023. 8. Coronary artery disease. Aortic Atherosclerosis  (ICD10-I70.0). Electronically Signed   By: Marolyn JONETTA Jaksch M.D.   On: 04/03/2023 18:51    Procedures Procedures    Medications Ordered in ED Medications  pantoprazole  (PROTONIX ) injection 40 mg (40 mg Intravenous Not Given 04/03/23 1455)    Followed by  pantoprazole  (PROTONIX ) injection 40 mg (40 mg Intravenous Given 04/04/23 0918)  potassium chloride  SA (KLOR-CON  M) CR tablet 60 mEq (60 mEq Oral Not Given 04/03/23 1457)  0.9 %  sodium chloride  infusion ( Intravenous Stopped 04/04/23 0749)  albuterol  (PROVENTIL ) (2.5 MG/3ML) 0.083% nebulizer solution 2.5 mg (has no administration in time range)  cefTRIAXone  (ROCEPHIN ) 1 g in sodium chloride  0.9 % 100 mL IVPB (has no administration in time range)  Chlorhexidine  Gluconate Cloth 2 % PADS 6 each (6 each Topical Given 04/03/23 1904)  potassium chloride  10 mEq in 100 mL IVPB ( Intravenous Infusion Verify 04/04/23 0808)  0.9 %  sodium chloride  infusion ( Intravenous New Bag/Given 04/04/23 0909)  metroNIDAZOLE  (FLAGYL ) IVPB 500 mg (500 mg Intravenous New Bag/Given 04/04/23 0853)  acetaminophen  (OFIRMEV ) IV 1,000 mg (1,000 mg Intravenous New Bag/Given 04/04/23 0858)  hydrALAZINE  (APRESOLINE ) injection 2 mg (has no administration in time range)  cefTRIAXone  (ROCEPHIN ) 1 g in sodium chloride  0.9 % 100 mL IVPB (0 g Intravenous Stopped 04/03/23 1442)  sodium chloride  0.9 % bolus 500 mL (0 mLs Intravenous Stopped 04/03/23 1430)  0.9 %  sodium chloride  infusion (Manually program via Guardrails IV Fluids) (0 mLs Intravenous Stopped 04/03/23 1516)  potassium chloride  10 mEq in 100 mL IVPB (0 mEq Intravenous Stopped 04/03/23 1814)  calcium  gluconate 1 g/ 50 mL sodium chloride  IVPB (0 mg Intravenous Stopped 04/03/23 1444)  prothrombin  complex conc human (KCENTRA ) IVPB  1,103 Units (0 Units Intravenous Stopped 04/03/23 1513)  prothrombin  complex conc human (KCENTRA ) IVPB 1,609 Units (0 Units Intravenous Stopped 04/03/23 1437)  0.9 %  sodium chloride  infusion (Manually program via Guardrails  IV Fluids) (0 mLs Intravenous Stopped 04/03/23 1614)  0.9 %  sodium chloride  infusion (Manually program via Guardrails IV Fluids) (0 mLs Intravenous Stopped 04/03/23 1545)  iohexol  (OMNIPAQUE ) 350 MG/ML injection 80 mL (80 mLs Intravenous Contrast Given 04/03/23 1827)  magnesium  sulfate IVPB 2 g 50 mL (0 g Intravenous Stopping previously hung infusion 04/03/23 1930)  potassium chloride  10 mEq in 100 mL IVPB (0 mEq Intravenous Stopped 04/03/23 2101)  magnesium  sulfate IVPB 2 g 50 mL (0 g Intravenous Stopped 04/03/23 2217)  phenazopyridine  (PYRIDIUM ) tablet 100 mg (100 mg Oral Given 04/03/23 2330)    ED Course/ Medical Decision Making/ A&P Clinical Course as of 04/04/23 9063  Fri Apr 03, 2023  1400 Discussed with Dr Rosalie from GI who recommends stabilization and they will see in the morning for endoscopy [RP]  1428 Brother updated regarding pt in critical status [RP]  1508 Dr Kassie to admit.  [RP]    Clinical Course User Index [RP] Yolande [name] BROCKS, MD                                 Medical Decision Making Amount and/or Complexity of Data Reviewed Labs: ordered. Radiology: ordered.  Risk Prescription drug management. Decision regarding hospitalization.   Jazelyn Sipe Pangelinan is a 78 y.o. female with comorbidities that complicate the patient evaluation including stroke, atrial fibrillation on Eliquis , and Barrett's esophagus who presents emergency department with generalized weakness, dysuria, dark stools.    Initial Ddx:  Upper GI bleed, lower GI bleed, sepsis, UTI  MDM/Course:  Patient presents to the emergency department with black stools and dysuria with an episode of hypotension for EMS.  Initially on arrival her pressures were soft in the low 100s.  During her stay in the emergency department she started become hypotensive.  Suspected that this was from a massive GI bleed.  She was ordered for emergency blood and given 3 units in total.  Was also given a liter of normal saline while we are  waiting for blood as well as started on Levophed .  She was reversed with Kcentra .  Initially was given a half dose to avoid a thrombotic event because of her A-fib and history of stroke recently that she reports to me.  When she started requiring pressors she was given a full dose of Kcentra .  After the blood she was eventually able to be weaned off of the Levophed .  Discussed with GI who will perform endoscopy tomorrow.  Critical care will admit the patient as well.  She initially was given ceftriaxone  for suspected UTI but after she started requiring pressors broadened her coverage out to include vancomycin  and cefepime .  She was found to be hypokalemic, hypocalcemic, and hypomagnesemic.  These were replenished via IV.  Upon re-evaluation patient appeared clinically improved but with her pressor use and transfusion requirement was admitted to the ICU.  Did confirm full code with the patient.  This patient presents to the ED for concern of complaints listed in HPI, this involves an extensive number of treatment options, and is a complaint that carries with it a high risk of complications and morbidity. Disposition including potential need for admission considered.   Dispo: ICU  Additional history obtained from  brother Records reviewed Outpatient Clinic Notes The following labs were independently interpreted: Chemistry and show  hypokalemia I personally reviewed and interpreted cardiac monitoring: atrial fibrillation (normal rate) I personally reviewed and interpreted the pt's EKG: see above for interpretation  I have reviewed the patients home medications and made adjustments as needed Consults: Critical care and Gastroenterology Social Determinants of health:  Elderly  Portions of this note were generated with Scientist, clinical (histocompatibility and immunogenetics). Dictation errors may occur despite best attempts at proofreading.    CRITICAL CARE Performed by: [name] JAYSON Shan   Total critical care time: 60  minutes  Critical care time was exclusive of separately billable procedures and treating other patients.  Critical care was necessary to treat or prevent imminent or life-threatening deterioration.  Critical care was time spent personally by me on the following activities: development of treatment plan with patient and/or surrogate as well as nursing, discussions with consultants, evaluation of patient's response to treatment, examination of patient, obtaining history from patient or surrogate, ordering and performing treatments and interventions, ordering and review of laboratory studies, ordering and review of radiographic studies, pulse oximetry and re-evaluation of patient's condition.   Final Clinical Impression(s) / ED Diagnoses Final diagnoses:  Gastrointestinal hemorrhage, unspecified gastrointestinal hemorrhage type  Hemorrhagic shock (HCC)  Urinary tract infection without hematuria, site unspecified  Chronic anticoagulation    Rx / DC Orders ED Discharge Orders     None         Shan [name] JAYSON, MD 04/04/23 [phone]

## 2023-04-03 NOTE — H&P (Signed)
 NAME:  Monica Conway, MRN:  [id number], DOB:  05-Feb-1946, LOS: 0 ADMISSION DATE:  04/03/2023, CONSULTATION DATE:  04/03/23 REFERRING MD:  Dr. 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  Last dose of eliquis  this morning.  Lives alone but has caregiver to help during the day, uses walker/ wheelchair at baseline, recently discharged from rehab.  Denies any dizziness, syncope, HA, URI symptoms, chest/ abd/ or flank pain, N/V, falls, hematemesis or melena.  Currently having some lower abdominal cramping and needs to have a bowel movement.  Found hypotensive, afebrile, NSR, normoxia.  Was initially lethargic but since alert and oriented.  Labs significant for H/H 4.8/ 16.6 (baseline 9-10), K 2.5, bicarb 17, BUN/ sCr 69/ 1.17 and lactic acid 1.7.  FOB +.   Reversed with kcentra  and currently on 2nd of PRBC.  Started on low dose peripheral norepinephrine .  GI has been consulted, CTA GIB pending.  Additionally PPI, calcium , and KCL replete started.  PCCM called for admit.   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 rate 20, height 5' 2 (1.575 m), weight 53.5 kg, SpO2 100%.        Intake/Output Summary (Last 24 hours) at 04/03/2023 1528 Last data filed at 04/03/2023 1442 Gross per 24 hour  Intake 100 ml   Output --  Net 100 ml   Filed Weights   04/03/23 1217  Weight: 53.5 kg    Examination: General:  Elderly female lying in bed in NAD HEENT: MM pale/moist, pupils 3/r, anicteric Neuro: alert, oriented x 3, MAE- slightly diminished in LLL (at baseline with previous hip fx) CV: rr, NSR 80's, +2 distal pulses PULM:  non labored, clear anteriorly, shallow breaths GI: soft, bs hyperactive, ND, no obvious tenderness  Extremities: warm/dry, no pitting LE edema  Skin: no rashes   Resolved Hospital Problem list    Assessment & Plan:   Acute GIB on Eliquis  Hemorrhagic shock +/- r/o septic component with UTI - Admit to ICU - Appreciate GI input, plans for CTA GIB study, if + for stomach or duodenal, will need to call GI w/ plans for urgent endoscopy otherwise plans for EGD 2/8 am. - NPO - s/p eliquis  reversal with kcentra  - on 2 of 3 u PRBC, post transfusion CBC then q6hrs with post transfusion coags.  Transfuse for Hgb < 7/ ongoing significant blood loss - weaning peripheral NE for MAP goal > 65, currently at 2  - lactic remains reassuring - normal WBC and differential, Can continue with ceftriaxone  for 3 days, follow cultures, check UC - trend fever curve/ WBC   AKI 2/2 above NAGMA Hypokalemia - hemodynamic support and transfusion as above - check BMET after KCL runs completed and trend daily - pending mag, goal K >4, Mag > 2 - trend renal indices  -  strict I/Os, daily wts - avoid nephrotoxins, renal dose meds, hemodynamic support as above   PAF HTN - currently in NSR - optimize electrolytes - cont to hold BB w/ hypotension - cont to hold eliquis    Corrected calcium > 8.8 - s/p 1gm in ER   GOC - wishes to be DNR/ DNI, ok with current vasopressor support.  ED RN present for conversation.   Best Practice (right click and Reselect all SmartList Selections daily)   Diet/type: NPO DVT prophylaxis SCD Pressure ulcer(s): pressure ulcer assessment deferred  GI  prophylaxis: PPI Lines: N/A Foley:  N/A Code Status:  DNR/ DNR Last date of multidisciplinary goals of care discussion [04/03/23]  Brother is NOK.  States she has spoken with him and at this time does not want us  to call him.   Labs   CBC: Recent Labs  Lab 04/03/23 1233  WBC 8.6  NEUTROABS 6.9  HGB 4.8*  HCT 16.6*  MCV 93.8  PLT 253    Basic Metabolic Panel: Recent Labs  Lab 04/03/23 1233  NA 138  K 2.5*  CL 113*  CO2 17*  GLUCOSE 114*  BUN 69*  CREATININE 1.17*  CALCIUM  7.7*   GFR: Estimated Creatinine Clearance: 32.4 mL/min (A) (by C-G formula based on SCr of 1.17 mg/dL (H)). Recent Labs  Lab 04/03/23 1233 04/03/23 1352  WBC 8.6  --   LATICACIDVEN  --  1.7    Liver Function Tests: Recent Labs  Lab 04/03/23 1233  AST 13*  ALT 10  ALKPHOS 41  BILITOT 0.7  PROT 5.3*  ALBUMIN 2.6*   No results for input(s): LIPASE, AMYLASE in the last 168 hours. No results for input(s): AMMONIA in the last 168 hours.  ABG    Component Value Date/Time   HCO3 20.9 01/27/2023 0735   TCO2 16 (L) 01/24/2023 1037   ACIDBASEDEF 2.9 (H) 01/27/2023 0735   O2SAT 63.9 01/27/2023 0735     Coagulation Profile: No results for input(s): INR, PROTIME in the last 168 hours.  Cardiac Enzymes: No results for input(s): CKTOTAL, CKMB, CKMBINDEX, TROPONINI in the last 168 hours.  HbA1C: Hgb A1c MFr Bld  Date/Time Value Ref Range Status  01/25/2023 03:41 AM 5.1 4.8 - 5.6 % Final    Comment:    (NOTE) Pre diabetes:          5.7%-6.4%  Diabetes:              >6.4%  Glycemic control for   <7.0% adults with diabetes   02/05/2014 05:00 AM 5.9 (H) <5.7 % Final    Comment:    (NOTE)                                                                       According to the ADA Clinical Practice Recommendations for 2011, when HbA1c is used as a screening test:  >=6.5%   Diagnostic of Diabetes Mellitus           (if abnormal result is confirmed) 5.7-6.4%    Increased risk of developing Diabetes Mellitus References:Diagnosis and Classification of Diabetes Mellitus,Diabetes Care,2011,34(Suppl 1):S62-S69 and Standards of Medical Care in         Diabetes - 2011,Diabetes Care,2011,34 (Suppl 1):S11-S61.  CBG: No results for input(s): GLUCAP in the last 168 hours.  Review of Systems:   Review of Systems  Constitutional:  Positive for chills and malaise/fatigue. Negative for fever.  HENT:  Negative for congestion, sinus pain and sore throat.   Respiratory:  Negative for cough, sputum production and shortness of breath.   Cardiovascular:  Negative for chest pain, palpitations and leg swelling.  Gastrointestinal:  Positive for blood in stool. Negative for abdominal pain, melena, nausea and vomiting.  Genitourinary:  Positive for dysuria. Negative for flank pain.  Musculoskeletal:  Negative for falls.  Neurological:  Negative for dizziness, focal weakness and headaches.   Past Medical History:  She,  has a past medical history of Arthritis, Asthma, Blood transfusion without reported diagnosis, 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 (HCC) (01/26/2023), Hypertension, and Osteoporosis.   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 She has never used smokeless tobacco. She reports that she does not drink alcohol  and does not use drugs.   Family History:  Her family history includes Cataracts in her sister; Hypertension in her brother and sister.   Allergies Allergies  Allergen Reactions   Pollen Extract Shortness Of Breath   Amlodipine -Atorvastatin Other (See Comments)    Caduet is the name brand     Home  Medications  Prior to Admission medications   Medication Sig Start Date End Date Taking? Authorizing Provider  acetaminophen  (TYLENOL ) 500 MG tablet Take 2 tablets (1,000 mg total) by mouth every 8 (eight) hours as needed for mild pain (pain score 1-3). 01/31/23   Jolaine Pac, DO  amLODipine  (NORVASC ) 5 MG tablet Take 1 tablet (5 mg total) by mouth daily. 02/01/23   Jolaine Pac, DO  apixaban  (ELIQUIS ) 5 MG TABS tablet Take 1 tablet (5 mg total) by mouth 2 (two) times daily. 01/31/23   Jolaine Pac, DO  Cyanocobalamin (B-12 PO) Take 1 tablet by mouth daily.    [provider]  lidocaine  (LIDODERM ) 5 % Place 1 patch onto the skin daily. Remove & Discard patch within 12 hours or as directed by MD 02/02/23   Henderly, Britni A, PA-C  loperamide  (IMODIUM ) 2 MG capsule Take 1 capsule (2 mg total) by mouth daily as needed for diarrhea or loose stools. 01/31/23   Jolaine Pac, DO  melatonin 5 MG TABS Take 1 tablet (5 mg total) by mouth at bedtime. 01/31/23   Jolaine Pac, DO  metoprolol  succinate (TOPROL -XL) 50 MG 24 hr tablet Take 3 tablets (150 mg total) by mouth daily. 02/01/23   Jolaine Pac, DO  oxyCODONE -acetaminophen  (PERCOCET/ROXICET) 5-325 MG tablet Take 1 tablet by mouth every 8 (eight) hours as needed for severe pain (pain score 7-10). 02/02/23   Henderly, Britni A, PA-C  pantoprazole  (PROTONIX ) 40 MG tablet Take 40 mg by mouth daily. Patient stated she in not on this medication. 01/25/2023. 11/09/22   [provider]  PROLIA 60 MG/ML SOSY injection Inject 60 mg into the skin every 6 (six) months. 08/08/22   [provider]  QUEtiapine  (SEROQUEL ) 25 MG tablet Take 1 tablet (25 mg total) by mouth daily as needed (As needed at night for  agitation). 01/31/23   Jolaine Pac, DO  VENTOLIN  HFA 108 (90 Base) MCG/ACT inhaler Inhale 1 puff into the lungs 4 (four) times daily. 10/21/22   [provider]     Critical care time: 45 mins        Lyle Pesa, MSN, AG-ACNP-BC [facility] 04/03/2023, 3:28 PM  See Amion for pager If no response to pager , please call [phone] until 7pm After 7:00 pm call Elink  [phone]

## 2023-04-03 NOTE — Progress Notes (Signed)
 Pharmacy Antibiotic Note  Monica Conway is a 78 y.o. female admitted on 04/03/2023 with sepsis.  Pt admitted with complaints of dysuria, melena. KCentra  reversal given for suspected GIB.  Pharmacy has been consulted for cefepime  and vancomycin  dosing.  Today, 04/03/23 WBC WNL, afebrile SCr 1.17, CrCl ~32 mL/min Hypotensive  Plan: Cefepime  2 g IV q12h Vancomycin  1000 mg IV q48h for estimated AUC of 415 Goal AUC 400-550. Check levels as needed Monitor renal function, culture data  Height: 5' 2 (157.5 cm) Weight: 53.5 kg (117 lb 15.1 oz) IBW/kg (Calculated) : 50.1  Temp (24hrs), Avg:98.2 F (36.8 C), Min:98.2 F (36.8 C), Max:98.2 F (36.8 C)  Recent Labs  Lab 04/03/23 1233  WBC 8.6  CREATININE 1.17*    Estimated Creatinine Clearance: 32.4 mL/min (A) (by C-G formula based on SCr of 1.17 mg/dL (H)).    Allergies  Allergen Reactions   Pollen Extract Shortness Of Breath   Amlodipine -Atorvastatin     Other Reaction(s): Not available    Antimicrobials this admission: cefepime  2/7 >>  vancomycin  2/7 >>  Dose adjustments this admission:  Microbiology results: 2/7 BCx: ngtd 2/7 UCx:    Ronal CHRISTELLA Rav, PharmD 04/03/2023 2:58 PM

## 2023-04-03 NOTE — Consult Note (Signed)
 Reason for Consult: GI bleed Referring Physician: Hospital team  Monica Conway is an 78 y.o. female.  HPI: Patient seen and examined and discussed with ER physician and the patient lives alone and is on Eliquis  from a recent stroke but no extra aspirin  or nonsteroidals and has not had much GI history lately although there is a remote history of Barrett's according to the ER physician and she did have some sort of colon surgery and she denies throwing up at home but has had some dark bowel movements but none since she has been in the hospital and her blood pressure is low and they are currently giving her the Eliquis  reversing agent as well as Levophed  and she is moderately sleepy and is having some trouble answering all the questions  Past Medical History:  Diagnosis Date   Arthritis    Asthma    Blood transfusion without reported diagnosis    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 (HCC) 01/26/2023   Hypertension    Osteoporosis     Past Surgical History:  Procedure Laterality Date   COLON SURGERY     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 She has never used smokeless tobacco. She reports that she does not drink alcohol  and does not use drugs.  Allergies:  Allergies  Allergen Reactions   Pollen Extract Shortness Of Breath    Medications: I have reviewed the patient's current medications.  Results for orders placed or performed during the hospital encounter of 04/03/23 (from the past 48 hours)  Type and screen [facility]     Status: None (Preliminary result)   Collection Time: 04/03/23 12:33 PM  Result Value  Ref Range   ABO/RH(D) A POS    Antibody Screen NEG    Sample Expiration      04/06/2023,2359 Performed at [facility], [address] [place], [place] [address]    Unit Number [id number]    Blood Component Type RED CELLS,LR    Unit division 00    Status of Unit ALLOCATED    Transfusion Status OK TO TRANSFUSE    Crossmatch Result Compatible    Unit Number [id number]    Blood Component Type RED CELLS,LR    Unit division 00    Status of Unit ALLOCATED    Transfusion Status OK TO TRANSFUSE    Crossmatch Result Compatible   CBC with Differential     Status: Abnormal   Collection Time: 04/03/23 12:33 PM  Result Value Ref Range   WBC 8.6 4.0 - 10.5 K/uL   RBC 1.77 (L) 3.87 - 5.11 MIL/uL   Hemoglobin 4.8 (LL) 12.0 - 15.0 g/dL    Comment: This critical result has verified and been called to [facility], L RN by SARA SHAEFFER on 02 07 2025 at 1303, and has been read back.    HCT 16.6 (L) 36.0 - 46.0 %   MCV 93.8 80.0 - 100.0 fL   MCH 27.1 26.0 - 34.0 pg   MCHC 28.9 (L) 30.0 - 36.0 g/dL   RDW 79.4 (H) 88.4 - 84.4 %  Platelets 253 150 - 400 K/uL   nRBC 0.5 (H) 0.0 - 0.2 %   Neutrophils Relative % 80 %   Neutro Abs 6.9 1.7 - 7.7 K/uL   Lymphocytes Relative 15 %   Lymphs Abs 1.3 0.7 - 4.0 K/uL   Monocytes Relative 4 %   Monocytes Absolute 0.3 0.1 - 1.0 K/uL   Eosinophils Relative 0 %   Eosinophils Absolute 0.0 0.0 - 0.5 K/uL   Basophils Relative 0 %   Basophils Absolute 0.0 0.0 - 0.1 K/uL   Immature Granulocytes 1 %   Abs Immature Granulocytes 0.04 0.00 - 0.07 K/uL    Comment: Performed at [facility], [address], [place], [place] [address]  Comprehensive metabolic panel     Status: Abnormal   Collection Time: 04/03/23 12:33 PM  Result Value Ref Range   Sodium 138 135 - 145 mmol/L   Potassium 2.5 (LL) 3.5 - 5.1 mmol/L    Comment: CRITICAL RESULT CALLED TO, READ BACK BY AND VERIFIED WITH [place], K PARAMEDIC @ 1323 ON 04/03/23     Chloride 113 (H) 98 - 111 mmol/L   CO2 17 (L) 22 - 32 mmol/L   Glucose, Bld 114 (H) 70 - 99 mg/dL    Comment: Glucose reference range applies only to samples taken after fasting for at least 8 hours.   BUN 69 (H) 8 - 23 mg/dL   Creatinine, Ser 8.82 (H) 0.44 - 1.00 mg/dL   Calcium  7.7 (L) 8.9 - 10.3 mg/dL   Total Protein 5.3 (L) 6.5 - 8.1 g/dL   Albumin 2.6 (L) 3.5 - 5.0 g/dL   AST 13 (L) 15 - 41 U/L   ALT 10 0 - 44 U/L   Alkaline Phosphatase 41 38 - 126 U/L   Total Bilirubin 0.7 0.0 - 1.2 mg/dL   GFR, Estimated 48 (L) >60 mL/min    Comment: (NOTE) Calculated using the CKD-EPI Creatinine Equation (2021)    Anion gap 8 5 - 15    Comment: Performed at [facility], [address], [place], [place] [address]  POC occult blood, ED     Status: Abnormal   Collection Time: 04/03/23  1:05 PM  Result Value Ref Range   Fecal Occult Bld POSITIVE (A) NEGATIVE  Prepare RBC (crossmatch)     Status: None   Collection Time: 04/03/23  1:13 PM  Result Value Ref Range   Order Confirmation      ORDERS RECEIVED TO CROSSMATCH Performed at [facility], [address], [place], [place] [address]   Urinalysis, Routine w reflex microscopic -Urine, Clean Catch     Status: Abnormal   Collection Time: 04/03/23  1:14 PM  Result Value Ref Range   Color, Urine YELLOW YELLOW   APPearance CLOUDY (A) CLEAR   Specific Gravity, Urine 1.014 1.005 - 1.030   pH 5.0 5.0 - 8.0   Glucose, UA NEGATIVE NEGATIVE mg/dL   Hgb urine dipstick SMALL (A) NEGATIVE   Bilirubin Urine NEGATIVE NEGATIVE   Ketones, ur NEGATIVE NEGATIVE mg/dL   Protein, ur NEGATIVE NEGATIVE mg/dL   Nitrite POSITIVE (A) NEGATIVE   Leukocytes,Ua LARGE (A) NEGATIVE   RBC / HPF 0-5 0 - 5 RBC/hpf   WBC, UA >50 0 - 5 WBC/hpf   Bacteria, UA RARE (A) NONE SEEN   Squamous Epithelial / HPF 0-5 0 - 5 /HPF   WBC Clumps PRESENT     Comment: Performed at [facility],  [address]  [place], [place] [address]    No results found.  Review of Systems negative except above Blood pressure (!) 92/38, pulse 73, temperature 98.2 F (36.8 C), temperature source Oral, resp. rate (!) 26, height 5' 2 (1.575 m), weight 53.5 kg, SpO2 100%. Physical Exam low blood pressure no acute distress abdomen is soft nontender BUN increased significantly hemoglobin 4.8 white count and platelet count okay CTA pending  Assessment/Plan: GI bleed in a patient on Eliquis  Plan: Agree with care per ER physician I have tentatively scheduled her for an endoscopy which based on anesthesia availability is tentatively scheduled for 10 AM tomorrow however if CTA positive for a stomach or duodenal site please call and we will try to proceed with urgent endoscopy or call me sooner if other GI question or problem  Rosamae Rocque E 04/03/2023, 2:13 PM

## 2023-04-03 NOTE — ED Triage Notes (Signed)
 Patient brought in by EMS due to dark tarry loose stool X a few days. EMS noted patient to hypotensive in route at 88/46. Pt also told EMS that she has been having burning with urination and foul odor to the urine.

## 2023-04-03 NOTE — Progress Notes (Signed)
 eLink Physician-Brief Progress Note Patient Name: SIMON AABERG DOB: 08-23-1945 MRN: [id number]   Date of Service  04/03/2023  HPI/Events of Note  Patient admitted with acute GI bleeding and acute blood loss anemia, EGD is scheduled for the AM.  eICU Interventions  New Patient Evaluation.        Marcellina PENNER Marvelle Span 04/03/2023, 9:03 PM

## 2023-04-04 ENCOUNTER — Encounter (HOSPITAL_COMMUNITY)
Admission: EM | Disposition: A | Discharge status: Skilled Nursing Facility | Payer: Self-pay | Source: Home / Self Care | Attending: Internal Medicine

## 2023-04-04 ENCOUNTER — Inpatient Hospital Stay (HOSPITAL_COMMUNITY): Payer: Medicare HMO | Admitting: Certified Registered Nurse Anesthetist

## 2023-04-04 ENCOUNTER — Encounter (HOSPITAL_COMMUNITY): Payer: Self-pay | Admitting: Pulmonary Disease

## 2023-04-04 DIAGNOSIS — R578 Other shock: Secondary | ICD-10-CM | POA: Diagnosis not present

## 2023-04-04 DIAGNOSIS — I10 Essential (primary) hypertension: Secondary | ICD-10-CM

## 2023-04-04 DIAGNOSIS — I679 Cerebrovascular disease, unspecified: Secondary | ICD-10-CM

## 2023-04-04 DIAGNOSIS — J45909 Unspecified asthma, uncomplicated: Secondary | ICD-10-CM | POA: Diagnosis not present

## 2023-04-04 DIAGNOSIS — K922 Gastrointestinal hemorrhage, unspecified: Secondary | ICD-10-CM | POA: Diagnosis not present

## 2023-04-04 HISTORY — PX: ESOPHAGOGASTRODUODENOSCOPY: SHX5428

## 2023-04-04 LAB — CBC
HCT: 27.9 % — ABNORMAL LOW (ref 36.0–46.0)
HCT: 27.5 % — ABNORMAL LOW (ref 36.0–46.0)
Hemoglobin: 8.9 g/dL — ABNORMAL LOW (ref 12.0–15.0)
Hemoglobin: 9 g/dL — ABNORMAL LOW (ref 12.0–15.0)
MCH: 28.6 pg (ref 26.0–34.0)
MCH: 28.6 pg (ref 26.0–34.0)
MCHC: 31.9 g/dL (ref 30.0–36.0)
MCHC: 32.7 g/dL (ref 30.0–36.0)
MCV: 89.7 fL (ref 80.0–100.0)
MCV: 87.3 fL (ref 80.0–100.0)
Platelets: 172 10*3/uL (ref 150–400)
Platelets: 181 10*3/uL (ref 150–400)
RBC: 3.11 MIL/uL — ABNORMAL LOW (ref 3.87–5.11)
RBC: 3.15 MIL/uL — ABNORMAL LOW (ref 3.87–5.11)
RDW: 16.9 % — ABNORMAL HIGH (ref 11.5–15.5)
RDW: 17 % — ABNORMAL HIGH (ref 11.5–15.5)
WBC: 8.1 10*3/uL (ref 4.0–10.5)
WBC: 6.9 10*3/uL (ref 4.0–10.5)
nRBC: 0.9 % — ABNORMAL HIGH (ref 0.0–0.2)
nRBC: 1.3 % — ABNORMAL HIGH (ref 0.0–0.2)

## 2023-04-04 LAB — RENAL FUNCTION PANEL
Albumin: 2.2 g/dL — ABNORMAL LOW (ref 3.5–5.0)
Anion gap: 7 (ref 5–15)
BUN: 47 mg/dL — ABNORMAL HIGH (ref 8–23)
CO2: 14 mmol/L — ABNORMAL LOW (ref 22–32)
Calcium: 7.3 mg/dL — ABNORMAL LOW (ref 8.9–10.3)
Chloride: 118 mmol/L — ABNORMAL HIGH (ref 98–111)
Creatinine, Ser: 0.96 mg/dL (ref 0.44–1.00)
GFR, Estimated: >60 mL/min (ref >60)
Glucose, Bld: 91 mg/dL (ref 70–99)
Phosphorus: 2.8 mg/dL (ref 2.5–4.6)
Potassium: 3 mmol/L — ABNORMAL LOW (ref 3.5–5.1)
Sodium: 139 mmol/L (ref 135–145)

## 2023-04-04 LAB — HEMOGLOBIN AND HEMATOCRIT, BLOOD
HCT: 26.7 % — ABNORMAL LOW (ref 36.0–46.0)
Hemoglobin: 8.2 g/dL — ABNORMAL LOW (ref 12.0–15.0)

## 2023-04-04 LAB — MAGNESIUM: Magnesium: 2.6 mg/dL — ABNORMAL HIGH (ref 1.7–2.4)

## 2023-04-04 SURGERY — EGD (ESOPHAGOGASTRODUODENOSCOPY)
Anesthesia: Monitor Anesthesia Care

## 2023-04-04 MED ORDER — KETAMINE HCL 10 MG/ML IJ SOLN
INTRAMUSCULAR | Status: DC | PRN
  Administered 2023-04-04 (×2): 10 mg via INTRAVENOUS

## 2023-04-04 MED ORDER — ONDANSETRON HCL 4 MG/2ML IJ SOLN
INTRAMUSCULAR | Status: DC | PRN
  Administered 2023-04-04: 4 mg via INTRAVENOUS

## 2023-04-04 MED ORDER — HYDRALAZINE HCL 20 MG/ML IJ SOLN
2.0000 mg | Freq: Four times a day (QID) | INTRAMUSCULAR | Status: DC | PRN
  Administered 2023-04-05: 2 mg via INTRAVENOUS
  Filled 2023-04-04: qty 1

## 2023-04-04 MED ORDER — PROPOFOL 1000 MG/100ML IV EMUL
INTRAVENOUS | Status: AC
  Filled 2023-04-04: qty 100

## 2023-04-04 MED ORDER — POTASSIUM CHLORIDE 10 MEQ/100ML IV SOLN
10.0000 meq | INTRAVENOUS | Status: AC
  Administered 2023-04-04 (×3): 10 meq via INTRAVENOUS
  Filled 2023-04-04 (×4): qty 100

## 2023-04-04 MED ORDER — PROPOFOL 500 MG/50ML IV EMUL
INTRAVENOUS | Status: DC | PRN
  Administered 2023-04-04: 50 ug/kg/min via INTRAVENOUS

## 2023-04-04 MED ORDER — METRONIDAZOLE 500 MG/100ML IV SOLN
500.0000 mg | Freq: Two times a day (BID) | INTRAVENOUS | Status: AC
  Administered 2023-04-04 – 2023-04-05 (×4): 500 mg via INTRAVENOUS
  Filled 2023-04-04 (×4): qty 100

## 2023-04-04 MED ORDER — POTASSIUM CHLORIDE 10 MEQ/100ML IV SOLN
10.0000 meq | Freq: Once | INTRAVENOUS | Status: AC
  Administered 2023-04-04: 10 meq via INTRAVENOUS

## 2023-04-04 MED ORDER — ACETAMINOPHEN 10 MG/ML IV SOLN
1000.0000 mg | Freq: Four times a day (QID) | INTRAVENOUS | Status: DC | PRN
  Administered 2023-04-04 – 2023-04-05 (×2): 1000 mg via INTRAVENOUS
  Filled 2023-04-04 (×2): qty 100

## 2023-04-04 MED ORDER — KETAMINE HCL 10 MG/ML IJ SOLN
INTRAMUSCULAR | Status: AC
  Filled 2023-04-04: qty 1

## 2023-04-04 MED ORDER — METOPROLOL SUCCINATE ER 25 MG PO TB24
25.0000 mg | ORAL_TABLET | Freq: Every day | ORAL | Status: DC
  Administered 2023-04-04: 25 mg via ORAL
  Filled 2023-04-04: qty 1

## 2023-04-04 MED ORDER — LACTATED RINGERS IV SOLN: INTRAVENOUS | Status: DC | PRN

## 2023-04-04 MED ORDER — PROPOFOL 10 MG/ML IV BOLUS
INTRAVENOUS | Status: DC | PRN
  Administered 2023-04-04: 20 mg via INTRAVENOUS

## 2023-04-04 MED ORDER — PHENYLEPHRINE 80 MCG/ML (10ML) SYRINGE FOR IV PUSH (FOR BLOOD PRESSURE SUPPORT)
PREFILLED_SYRINGE | INTRAVENOUS | Status: DC | PRN
  Administered 2023-04-04: 160 ug via INTRAVENOUS

## 2023-04-04 MED ORDER — LIDOCAINE 2% (20 MG/ML) 5 ML SYRINGE
INTRAMUSCULAR | Status: DC | PRN
  Administered 2023-04-04: 60 mg via INTRAVENOUS

## 2023-04-04 MED ORDER — SODIUM CHLORIDE 0.9 % IV SOLN: INTRAVENOUS | Status: AC

## 2023-04-04 MED ORDER — POTASSIUM CHLORIDE CRYS ER 20 MEQ PO TBCR: 20.0000 meq | EXTENDED_RELEASE_TABLET | ORAL | Status: DC

## 2023-04-04 MED ORDER — ALBUTEROL SULFATE HFA 108 (90 BASE) MCG/ACT IN AERS
INHALATION_SPRAY | RESPIRATORY_TRACT | Status: DC | PRN
  Administered 2023-04-04 (×3): 2 via RESPIRATORY_TRACT

## 2023-04-04 NOTE — Plan of Care (Signed)
?  Problem: Clinical Measurements: ?Goal: Respiratory complications will improve ?Outcome: Progressing ?  ?Problem: Elimination: ?Goal: Will not experience complications related to urinary retention ?Outcome: Progressing ?  ?

## 2023-04-04 NOTE — Progress Notes (Deleted)
 Patient scheduled for carelink pickup at 11:30 for an [facility] time [facility] by 12 for EGD today. Patient will return to ICU bed 1225. Patient agreeable and seen today by Dr. Lavaughn Portland.

## 2023-04-04 NOTE — Transfer of Care (Signed)
 Immediate Anesthesia Transfer of Care Note  Patient: Monica Conway  Procedure(s) Performed: ESOPHAGOGASTRODUODENOSCOPY (EGD)  Patient Location: PACU and Endoscopy Unit  Anesthesia Type:MAC  Level of Consciousness: awake and alert   Airway & Oxygen Therapy: Patient Spontanous Breathing and Patient connected to face mask oxygen  Post-op Assessment: Report given to RN and Post -op Vital signs reviewed and stable  Post vital signs: Reviewed and stable  Last Vitals:  Vitals Value Taken Time  BP 167/63 04/04/23 1204  Temp    Pulse 71 04/04/23 1207  Resp 25 04/04/23 1207  SpO2 98 % 04/04/23 1207  Vitals shown include unfiled device data.  Last Pain:  Vitals:   04/04/23 1102  TempSrc: Temporal  PainSc: 0-No pain         Complications: No notable events documented.

## 2023-04-04 NOTE — Op Note (Signed)
 [facility] Patient Name: Monica Conway Procedure Date: 04/04/2023 MRN: [id number] Attending MD: Oliva Boots , MD, [id number] Date of Birth: 1945-10-20 CSN: [id number] Age: 78 Admit Type: Inpatient Procedure:                Upper GI endoscopy Indications:              Acute post hemorrhagic anemia, Melena Providers:                Oliva Boots, MD, Almarie Pizza, RN, Coye                            Mbumina, Technician Referring MD:              Medicines:                Monitored Anesthesia Care Complications:            No immediate complications. Estimated Blood Loss:     Estimated blood loss: none. Procedure:                Pre-Anesthesia Assessment:                           - Prior to the procedure, a History and Physical                            was performed, and patient medications and                            allergies were reviewed. The patient's tolerance of                            previous anesthesia was also reviewed. The risks                            and benefits of the procedure and the sedation                            options and risks were discussed with the patient.                            All questions were answered, and informed consent                            was obtained. Prior Anticoagulants: The patient has                            taken Eliquis  (apixaban ), last dose was 1 day prior                            to procedure. She did get the reversing agent in                            the ER ASA Grade Assessment: III - A patient with  severe systemic disease. After reviewing the risks                            and benefits, the patient was deemed in                            satisfactory condition to undergo the procedure.                           After obtaining informed consent, the endoscope was                            passed under direct vision. Throughout the                             procedure, the patient's blood pressure, pulse, and                            oxygen saturations were monitored continuously. The                            [id number]-H190 ([id number]) Olympus endoscope was introduced                            through the mouth, and advanced to the fourth part                            of duodenum. The upper GI endoscopy was                            accomplished without difficulty. The patient                            tolerated the procedure well. Scope In: Scope Out: Findings:      The larynx was normal.      A medium-sized hiatal hernia was present.      Localized mild inflammation characterized by linear erosions was found       in the gastric body.      One non-bleeding cratered gastric ulcer with pigmented material was       found at the incisura. It could not be made to bleed with washing      One non-bleeding superficial duodenal ulcer with no stigmata of bleeding       was found in the duodenal bulb. It could not be made to bleed with       washing      The second portion of the duodenum, third portion of the duodenum and       fourth portion of the duodenum were normal.      The exam was otherwise without abnormality. Impression:               - Normal larynx.                           - Medium-sized hiatal hernia.                           -  Erosive gastritis.                           - Non-bleeding gastric ulcer with pigmented                            material.                           - Non-bleeding duodenal ulcer with no stigmata of                            bleeding.                           - Normal second portion of the duodenum, third                            portion of the duodenum and fourth portion of the                            duodenum.                           - The examination was otherwise normal.                           - No specimens collected. Moderate Sedation:      Not Applicable - Patient had care per  Anesthesia. Recommendation:           - Clear liquid diet today.                           - Continue present medications. She will require                            twice a day pump inhibitor at home long-term since                            she developed these ulcers while on once a day and                            supposedly she was recently changed from                            pantoprazole  to omeprazole but also recently                            changed from Xarelto to Eliquis  and you will have                            to decide which 1 if any need to use going forward                           - Return to primary [facility] in 4 weeks  and consider repeat endoscopy in 3 months to                            document healing and biopsy for H. pylori and treat                            if positive.                           - Telephone GI clinic if symptomatic PRN. I will                            check on tomorrow Procedure Code(s):        --- Professional ---                           [id number], Esophagogastroduodenoscopy, flexible,                            transoral; diagnostic, including collection of                            specimen(s) by brushing or washing, when performed                            (separate procedure) Diagnosis Code(s):        --- Professional ---                           K44.9, Diaphragmatic hernia without obstruction or                            gangrene                           K29.60, Other gastritis without bleeding                           K25.9, Gastric ulcer, unspecified as acute or                            chronic, without hemorrhage or perforation                           K26.9, Duodenal ulcer, unspecified as acute or                            chronic, without hemorrhage or perforation                           D62, Acute posthemorrhagic anemia                           K92.1, Melena (includes  Hematochezia) CPT copyright 2022 American [employer]. All rights reserved. The codes documented in this report are preliminary and upon coder review may  be revised to meet current compliance requirements. Oliva Boots,  MD 04/04/2023 12:03:09 PM This report has been signed electronically. Number of Addenda: 0

## 2023-04-04 NOTE — Anesthesia Preprocedure Evaluation (Signed)
 Anesthesia Evaluation  Patient identified by MRN, date of birth, ID band Patient awake    Reviewed: Allergy & Precautions, H&P , NPO status , Patient's Chart, lab work & pertinent test results  Airway Mallampati: II   Neck ROM: full    Dental   Pulmonary asthma    breath sounds clear to auscultation       Cardiovascular hypertension, + dysrhythmias Atrial Fibrillation  Rhythm:regular Rate:Normal     Neuro/Psych CVA    GI/Hepatic GI bleeding   Endo/Other    Renal/GU      Musculoskeletal  (+) Arthritis ,    Abdominal   Peds  Hematology  (+) Blood dyscrasia, anemia   Anesthesia Other Findings   Reproductive/Obstetrics                             Anesthesia Physical Anesthesia Plan  ASA: 3  Anesthesia Plan: MAC   Post-op Pain Management:    Induction: Intravenous  PONV Risk Score and Plan: 2 and Propofol  infusion and Treatment may vary due to age or medical condition  Airway Management Planned: Nasal Cannula  Additional Equipment:   Intra-op Plan:   Post-operative Plan:   Informed Consent: I have reviewed the patients History and Physical, chart, labs and discussed the procedure including the risks, benefits and alternatives for the proposed anesthesia with the patient or authorized representative who has indicated his/her understanding and acceptance.     Dental advisory given  Plan Discussed with: CRNA, Anesthesiologist and Surgeon  Anesthesia Plan Comments:        Anesthesia Quick Evaluation

## 2023-04-04 NOTE — Progress Notes (Signed)
 Monica Conway 9:30 AM  Subjective: Patient seen and examined and case discussed with her niece and she was just recently changed from Xarelto to Eliquis  and did not have any bleeding while on Xarelto but they do not know why they changed it and she has had multiple endoscopies and colonoscopies at [facility] clinic and supposedly is due for a colonoscopy this year and she has not had any bleeding since she has been in the hospital and denies any pain and is not on any extra aspirin  or nonsteroidals at home and has no new complaints  Objective: Vital signs stable afebrile no acute distress much more alert and oriented compared to yesterday abdomen is soft nontender BUN decreased hemoglobin slight drop with hydration but stable CT without signs of active bleeding with large hiatal hernia and some other mild findings doubt she has colitis or pancreatitis without diarrhea or pain and normal lipase  Assessment: Melena increased BUN and patient on Eliquis   Plan: Will proceed with an endoscopy today either here or at [facility] based on anesthesia availability with further workup and plans pending those findings  [facility] E  office [phone] After 5PM or if no answer call [phone]

## 2023-04-04 NOTE — Progress Notes (Addendum)
 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  She also developed dysuria and dark stool the night prior to admission.  Last dose of Eliquis  was the morning of 2/7.  She was found to be hypotensive, lethargic, hemoglobin of 4.8, potassium 2.5, bicarb 17, creatinine 1.1, lactic acid 1.7.  She received Kcentra  and 2 units of packed red blood cell.  Started on low-dose peripheral norepinephrine .  GI has been consulted.  She was admitted by CCM to 2/7.  Transfer care to [facility] 2/8.   Assessment & Plan:   Principal Problem:   GI bleed  1-Acute GI bleed Hemorrhagic shock, plus or minus component of septic shock due to UTI -Status post reversal Eliquis  with Kcentra  -3 units of packed red blood cell ordered on admission CTA Abdomen: proctocolitis. No directly visualized intraluminal contrast enhancement or other findings to specifically localize GI bleeding. Diffuse inflammatory fat stranding about the pancreas, consistent with acute pancreatitis  Proctocolitis:  -she report she was having diarrhea prior to admission. No BM since yesterday.  -CT scan shows:  Diffuse mucosal hyperenhancement of the sigmoid colon and rectum, consistent with nonspecific infectious or inflammatory proctocolitis.  -Continue with Ceftriaxone , will add flagyl .   Acute Pancreatitis ? By CT scan.  -Lipase: 27 -denies abdominal pain.  -Follow up out patient.   AKI Non-anion gap metabolic acidosis Hypokalemia Continue with IV fluids.  Replete potassium IV   PAF Hypertension -Holding eliquis .  -no further hypotension.  -off pressors.  Will resume lower dose  metoprolol .   Hypocalcemia -replaced  Hypomagnesemia;  Replaced  Hypokalemia:  Replaced.   Anisocoria, left pupil slightly bigger than right. Patient report she was told pupils were different size many years ago by her ophthalmology  Estimated body mass index is 19.76 kg/m as calculated from the following:   Height as of this encounter: 5' 2 (1.575 m).   Weight as of this encounter: 49 kg.   DVT prophylaxis: scd Code Status: Full code Family Communication: Care discussed with patient.  Disposition Plan:  Status is: Inpatient Remains inpatient appropriate because: remain inpatient for management of shock.     Consultants:  CCM admitted patient.  GI  Procedures:  Endoscopy   Antimicrobials:    Subjective: She is alert, she doesn't remember she received blood transfusion. No BM since yesterday. Denies abdominal pain   Objective: Vitals:   04/04/23 0300 04/04/23 0400 04/04/23 0500 04/04/23 0600  BP: (!) 147/52 110/77  (!) 145/62  Pulse: (!) 57 72  (!) 56  Resp: 14 17  16   Temp:  98.4 F (36.9 C)    TempSrc:  Axillary    SpO2: 97% 97%  99%  Weight:   49 kg   Height:        Intake/Output Summary (Last 24 hours) at 04/04/2023 0709 Last data filed at 04/04/2023 0430 Gross per 24 hour  Intake 1155.84 ml  Output 550 ml  Net 605.84 ml   Filed Weights   04/03/23 1217 04/04/23 0500  Weight: 53.5 kg 49 kg    Examination:  General exam: Appears calm and comfortable  Respiratory system: Clear to auscultation. Respiratory effort normal. Cardiovascular system: S1 & S2 heard, RRR. No  JVD, murmurs, rubs, gallops or clicks. No pedal edema. Gastrointestinal system: Abdomen is nondistended, soft and nontender. No organomegaly or masses felt. Normal bowel sounds heard. Central nervous system: Alert and oriented. Data Reviewed: I have personally reviewed following labs and imaging studies  CBC: Recent Labs  Lab 04/03/23 1233 04/03/23 1712 04/03/23 2157  04/04/23 0454  WBC 8.6 7.6 10.0 8.1  NEUTROABS 6.9  --   --   --   HGB 4.8* 9.7* 9.9* 8.9*  HCT 16.6* 29.9* 30.9* 27.9*  MCV 93.8 89.3 90.6 89.7  PLT 253 164 167 172   Basic Metabolic Panel: Recent Labs  Lab 04/03/23 1233 04/03/23 1712 04/04/23 0454  NA 138 141 139  K 2.5* 3.1* 3.0*  CL 113* 118* 118*  CO2 17* 15* 14*  GLUCOSE 114* 139* 91  BUN 69* 62* 47*  CREATININE 1.17* 0.99 0.96  CALCIUM  7.7* 7.0* 7.3*  MG  --  1.3* 2.6*  PHOS  --   --  2.8   GFR: Estimated Creatinine Clearance: 38.6 mL/min (by C-G formula based on SCr of 0.96 mg/dL). Liver Function Tests: Recent Labs  Lab 04/03/23 1233 04/04/23 0454  AST 13*  --   ALT 10  --   ALKPHOS 41  --   BILITOT 0.7  --   PROT 5.3*  --   ALBUMIN 2.6* 2.2*   Recent Labs  Lab 04/03/23 1935  LIPASE 27   No results for input(s): AMMONIA in the last 168 hours. Coagulation Profile: No results for input(s): INR, PROTIME in the last 168 hours. Cardiac Enzymes: No results for input(s): CKTOTAL, CKMB, CKMBINDEX, TROPONINI in the last 168 hours. BNP (last 3 results) No results for input(s): PROBNP in the last 8760 hours. HbA1C: No results for input(s): HGBA1C in the last 72 hours. CBG: No results for input(s): GLUCAP in the last 168 hours. Lipid Profile: No results for input(s): CHOL, HDL, LDLCALC, TRIG, CHOLHDL, LDLDIRECT in the last 72 hours. Thyroid Function Tests: No results for input(s): TSH, T4TOTAL, FREET4, T3FREE, THYROIDAB in the last 72 hours. Anemia Panel: No results for input(s): VITAMINB12, FOLATE, FERRITIN, TIBC, IRON , RETICCTPCT in the last 72 hours. Sepsis Labs: Recent Labs  Lab 04/03/23 1352 04/03/23 1712  LATICACIDVEN 1.7 1.1    Recent Results (from the past 240 hours)  Blood culture (routine x 2)     Status: None (Preliminary result)   Collection Time: 04/03/23  1:40 PM   Specimen: BLOOD LEFT ARM  Result Value Ref Range Status    Specimen Description   Final    BLOOD LEFT ARM Performed at [facility], [address] [place], [place] [address]    Special Requests   Final    BOTTLES DRAWN AEROBIC AND ANAEROBIC Blood Culture results may not be optimal due to an inadequate volume of blood received in culture bottles Performed at [facility], [address], [place], [place] [address]    Culture PENDING  Incomplete   Report Status PENDING  Incomplete  MRSA Next Gen by PCR, Nasal     Status: None   Collection Time: 04/03/23  5:28 PM   Specimen: Nasal Mucosa; Nasal Swab  Result Value Ref Range Status   MRSA by PCR Next Gen NOT DETECTED NOT DETECTED Final    Comment: (NOTE) The GeneXpert MRSA Assay (FDA approved for NASAL specimens only), is one component of a comprehensive MRSA colonization surveillance program. It is not intended to diagnose MRSA infection nor to guide or  monitor treatment for MRSA infections. Test performance is not FDA approved in patients less than 51 years old. Performed at [facility], [address] [place], [place] [address]          Radiology Studies: CT ANGIO GI BLEED Result Date: 04/03/2023 CLINICAL DATA:  Melena, concern for upper GI bleed, hypotension EXAM: CTA ABDOMEN AND PELVIS WITHOUT AND WITH CONTRAST TECHNIQUE: Multidetector CT imaging of the abdomen and pelvis was performed using the standard protocol during bolus administration of intravenous contrast. Multiplanar reconstructed images and MIPs were obtained and reviewed to evaluate the vascular anatomy. RADIATION DOSE REDUCTION: This exam was performed according to the departmental dose-optimization program which includes automated exposure control, adjustment of the mA and/or kV according to patient size and/or use of iterative reconstruction technique. CONTRAST:  80mL OMNIPAQUE  IOHEXOL  350 MG/ML SOLN COMPARISON:  CT pelvis, 02/02/2023 FINDINGS: VASCULAR Normal contour and caliber  of the abdominal aorta. Moderate mixed calcific atherosclerosis. No evidence of aneurysm, dissection, or other acute aortic pathology. Standard branching pattern of the abdominal aorta with solitary bilateral renal arteries. Review of the MIP images confirms the above findings. NON-VASCULAR Lower Chest: No acute findings. Large hiatal hernia with nearly complete intrathoracic position of the stomach. Coronary artery calcifications. Hepatobiliary: No solid liver abnormality is seen. Gallstones. No gallbladder wall thickening, or biliary dilatation. Pancreas: Diffuse inflammatory fat stranding about the pancreas. No pancreatic parenchymal necrosis or acute pancreatic fluid collection. Spleen: Normal in size without significant abnormality. Adrenals/Urinary Tract: Adrenal glands are unremarkable. Kidneys are normal, without renal calculi, solid lesion, or hydronephrosis. Bladder is unremarkable. Stomach/Bowel: Stomach is within normal limits. Descending duodenal diverticulum. Evidence of partial right hemicolectomy and ileocolic anastomosis. Diffuse mucosal hyperenhancement of the sigmoid colon and rectum (series 12, image 466). The colon is fluid-filled to the rectum. No directly visualized intraluminal contrast enhancement or other findings to specifically localize GI bleeding. Lymphatic: No enlarged abdominal or pelvic lymph nodes. Reproductive: No mass or other significant abnormality. Other: No abdominal wall hernia or abnormality. No ascites. Musculoskeletal: Subacute, partially callused fractures of the left pubic rami, seen acutely on examination dated 02/02/2023 (series 6, image 170). Status post left hip total arthroplasty. IMPRESSION: 1. No directly visualized intraluminal contrast enhancement or other findings to specifically localize GI bleeding. 2. Diffuse mucosal hyperenhancement of the sigmoid colon and rectum, consistent with nonspecific infectious or inflammatory proctocolitis. The colon is  fluid-filled to the rectum in keeping with diarrheal illness. 3. Diffuse inflammatory fat stranding about the pancreas, consistent with acute pancreatitis. No pancreatic parenchymal necrosis or acute pancreatic fluid collection. 4. Evidence of partial right hemicolectomy and ileocolic anastomosis. 5. Large hiatal hernia with nearly complete intrathoracic position of the stomach. 6. Cholelithiasis. 7. Subacute, partially callused fractures of the left pubic rami, seen acutely on examination dated 02/02/2023. 8. Coronary artery disease. Aortic Atherosclerosis (ICD10-I70.0). Electronically Signed   By: Marolyn JONETTA Jaksch M.D.   On: 04/03/2023 18:51        Scheduled Meds:  Chlorhexidine  Gluconate Cloth  6 each Topical Daily   pantoprazole  (PROTONIX ) IV  40 mg Intravenous Q12H   potassium chloride   20 mEq Oral Q4H   potassium chloride   60 mEq Oral Once   Continuous Infusions:  sodium chloride  100 mL/hr at 04/04/23 0112   cefTRIAXone  (ROCEPHIN )  IV     norepinephrine  (LEVOPHED ) Adult infusion Stopped (04/03/23 1656)   potassium chloride  Stopped (04/04/23 0751)     LOS: 1 day    Time spent: 35 minutes  Owen DELENA Lore, MD [facility]   If 7PM-7AM, please contact night-coverage [email or web address]  04/04/2023, 7:09 AM

## 2023-04-05 DIAGNOSIS — R578 Other shock: Secondary | ICD-10-CM | POA: Diagnosis not present

## 2023-04-05 LAB — CBC
HCT: 31.1 % — ABNORMAL LOW (ref 36.0–46.0)
Hemoglobin: 10.1 g/dL — ABNORMAL LOW (ref 12.0–15.0)
MCH: 28.8 pg (ref 26.0–34.0)
MCHC: 32.5 g/dL (ref 30.0–36.0)
MCV: 88.6 fL (ref 80.0–100.0)
Platelets: 236 10*3/uL (ref 150–400)
RBC: 3.51 MIL/uL — ABNORMAL LOW (ref 3.87–5.11)
RDW: 17.4 % — ABNORMAL HIGH (ref 11.5–15.5)
WBC: 7.1 10*3/uL (ref 4.0–10.5)
nRBC: 0.7 % — ABNORMAL HIGH (ref 0.0–0.2)

## 2023-04-05 LAB — BASIC METABOLIC PANEL
Anion gap: 9 (ref 5–15)
BUN: 18 mg/dL (ref 8–23)
CO2: 17 mmol/L — ABNORMAL LOW (ref 22–32)
Calcium: 8.2 mg/dL — ABNORMAL LOW (ref 8.9–10.3)
Chloride: 114 mmol/L — ABNORMAL HIGH (ref 98–111)
Creatinine, Ser: 0.83 mg/dL (ref 0.44–1.00)
GFR, Estimated: >60 mL/min (ref >60)
Glucose, Bld: 88 mg/dL (ref 70–99)
Potassium: 3.8 mmol/L (ref 3.5–5.1)
Sodium: 140 mmol/L (ref 135–145)

## 2023-04-05 LAB — IRON AND TIBC
Iron: 17 ug/dL — ABNORMAL LOW (ref 28–170)
Saturation Ratios: 6 % — ABNORMAL LOW (ref 10.4–31.8)
TIBC: 281 ug/dL (ref 250–450)
UIBC: 264 ug/dL

## 2023-04-05 LAB — URINE CULTURE: Culture: >=100000 — AB

## 2023-04-05 MED ORDER — AMLODIPINE BESYLATE 5 MG PO TABS
5.0000 mg | ORAL_TABLET | Freq: Every day | ORAL | Status: DC
Start: 2023-04-05 — End: 2023-04-09
  Administered 2023-04-05 – 2023-04-09 (×5): 5 mg via ORAL
  Filled 2023-04-05 (×5): qty 1

## 2023-04-05 MED ORDER — QUETIAPINE FUMARATE 50 MG PO TABS
25.0000 mg | ORAL_TABLET | Freq: Every day | ORAL | Status: DC | PRN
  Administered 2023-04-05: 25 mg via ORAL
  Filled 2023-04-05: qty 1

## 2023-04-05 MED ORDER — METOPROLOL SUCCINATE ER 50 MG PO TB24
50.0000 mg | ORAL_TABLET | Freq: Every day | ORAL | Status: DC
  Administered 2023-04-05 – 2023-04-09 (×5): 50 mg via ORAL
  Filled 2023-04-05: qty 1
  Filled 2023-04-05: qty 2
  Filled 2023-04-05: qty 1
  Filled 2023-04-05: qty 2
  Filled 2023-04-05: qty 1

## 2023-04-05 MED ORDER — SODIUM CHLORIDE 0.9 % IV SOLN: INTRAVENOUS | Status: DC

## 2023-04-05 MED ORDER — POTASSIUM CHLORIDE 10 MEQ/100ML IV SOLN
10.0000 meq | INTRAVENOUS | Status: DC
  Filled 2023-04-05 (×4): qty 100

## 2023-04-05 MED ORDER — LORAZEPAM 2 MG/ML IJ SOLN
1.0000 mg | Freq: Once | INTRAMUSCULAR | Status: AC
  Administered 2023-04-05: 1 mg via INTRAVENOUS
  Filled 2023-04-05: qty 1

## 2023-04-05 NOTE — Progress Notes (Addendum)
 Ronal SQUIBB Ouk 9:47 AM  Subjective: Patient without a bowel movement since the endoscopy unit which was all melena and wants to go home and has no new complaints tolerating liquids  Objective: Vital signs stable afebrile no acute distress abdomen is soft nontender chemistries pending expect BUN to decrease hemoglobin okay status posttransfusion  Assessment: Gastric ulcer and duodenal ulcer currently stable doubt patient has C. difficile or infectious colitis and was probably just having melena at home  Plan: Twice daily pump inhibitors long-term since she developed ulcers on once a day and she denies any aspirin  and nonsteroidals at home but you will need to decide which blood thinner if any she requires at home and please call us  if we can be of any further assistance with this hospital stay otherwise can follow-up as an outpatient with her primary [facility] gastroenterologist for probable repeat endoscopy in 3 months to document healing and rule out H. pylori and can slowly advance diet and change to p.o. pump inhibitors and I answered all of the patient's questions  [facility] E  office [phone] After 5PM or if no answer call [phone]

## 2023-04-05 NOTE — Progress Notes (Signed)
 PROGRESS NOTE    Monica Conway  FMW:[id number] DOB: 31-Jan-1946 DOA: 04/03/2023 [facility]CP: [facility]   Brief Narrative: 78 year old with past medical history significant for paroxysmal A-fib on Eliquis , CVA right basal ganglia 2024 with no focal deficits, hypertension, osteoporosis, Barrett's esophagus presents with fatigue, dyspnea on exertion and shortness of breath for the last 2 days.  She also developed dysuria and dark stool the night prior to admission.  Last dose of Eliquis  was the morning of 2/7.  She was found to be hypotensive, lethargic, hemoglobin of 4.8, potassium 2.5, bicarb 17, creatinine 1.1, lactic acid 1.7.  She received Kcentra  and 2 units of packed red blood cell.  Started on low-dose peripheral norepinephrine .  GI has been consulted.  She was admitted by CCM to 2/7.  Transfer care to triad 2/8.   Assessment & Plan:   Principal Problem:   GI bleed  1-Acute GI bleed Acute Blood loss anemia Hemorrhagic shock, plus or minus component of septic shock due to UTI -Status post reversal Eliquis  with Kcentra  -S/P 3 units of packed red blood cell- CTA Abdomen: proctocolitis. No directly visualized intraluminal contrast enhancement or other findings to specifically localize GI bleeding. Diffuse inflammatory fat stranding about the pancreas, consistent with acute pancreatitis -S/P endoscopy 2/07: Erosive Gastritis,  Non-bleeding gastric ulcer with pigmented material. Non-bleeding duodenal ulcer with no stigmata of  bleeding. -Hb on admission at 4. ---increase to 9--8.9--8.2 Labs pending for morning. Last BM yesterday.  Check Iron  levels.   Proctocolitis:  -she report she was having diarrhea prior to admission. No BM since yesterday.  -CT scan shows:  Diffuse mucosal hyperenhancement of the sigmoid colon and rectum, consistent with nonspecific infectious or inflammatory proctocolitis.  -Continue with Ceftriaxone  and  flagyl .   Acute Pancreatitis ? By CT scan.   -Lipase: 27 -Denies abdominal pain.  -Follow up out patient.   AKI Non-anion gap metabolic acidosis Hypokalemia Continue with IV fluids.  Labs Pending.    PAF Hypertension -Holding eliquis . Follow GI recommendation in regards to eliquis  resumption.  -no further hypotension.  -off pressors.  -Resume lower dose metoprolol .  -Resume Norvasc .   Hypocalcemia -replaced  Hypomagnesemia;  Replaced  Hypokalemia:  Replaced.  Nightmares. Anxiety;  Pleasantly decline sleeping pills.  Support care.   Anisocoria, left pupil slightly bigger than right. Patient report she was told pupils were different size many years ago by her ophthalmology  Estimated body mass index is 19.76 kg/m as calculated from the following:   Height as of this encounter: 5' 2 (1.575 m).   Weight as of this encounter: 49 kg.   DVT prophylaxis: scd Code Status: Full code Family Communication: Care discussed with patient.  Disposition Plan:  Status is: Inpatient Remains inpatient appropriate because: remain inpatient for management of shock.     Consultants:  CCM admitted patient.  GI  Procedures:  Endoscopy   Antimicrobials:    Subjective: She is alert, she is anxious. She couldn't sleep last night, she was having nightmares.  She denies abdominal pain. Last BM was yesterday,   Objective: Vitals:   04/05/23 0200 04/05/23 0300 04/05/23 0400 04/05/23 0500  BP: (!) 187/53  (!) 174/76 (!) 154/84  Pulse: (!) 55  77 79  Resp: 14  19 17   Temp:  97.8 F (36.6 C)    TempSrc:  Oral    SpO2: 97%  98% 98%  Weight:      Height:        Intake/Output Summary (Last 24  hours) at 04/05/2023 0759 Last data filed at 04/04/2023 1700 Gross per 24 hour  Intake 1435.23 ml  Output 500 ml  Net 935.23 ml   Filed Weights   04/03/23 1217 04/04/23 0500  Weight: 53.5 kg 49 kg    Examination:  General exam: NAD Respiratory system: CTA Cardiovascular system: S 1, S 2 RRR Gastrointestinal system: BS  present, soft nt Central nervous system: Non focal  Data Reviewed: I have personally reviewed following labs and imaging studies  CBC: Recent Labs  Lab 04/03/23 1233 04/03/23 1712 04/03/23 2157 04/04/23 0454 04/04/23 1021 04/04/23 2002  WBC 8.6 7.6 10.0 8.1 6.9  --   NEUTROABS 6.9  --   --   --   --   --   HGB 4.8* 9.7* 9.9* 8.9* 9.0* 8.2*  HCT 16.6* 29.9* 30.9* 27.9* 27.5* 26.7*  MCV 93.8 89.3 90.6 89.7 87.3  --   PLT 253 164 167 172 181  --    Basic Metabolic Panel: Recent Labs  Lab 04/03/23 1233 04/03/23 1712 04/04/23 0454  NA 138 141 139  K 2.5* 3.1* 3.0*  CL 113* 118* 118*  CO2 17* 15* 14*  GLUCOSE 114* 139* 91  BUN 69* 62* 47*  CREATININE 1.17* 0.99 0.96  CALCIUM  7.7* 7.0* 7.3*  MG  --  1.3* 2.6*  PHOS  --   --  2.8   GFR: Estimated Creatinine Clearance: 38.6 mL/min (by C-G formula based on SCr of 0.96 mg/dL). Liver Function Tests: Recent Labs  Lab 04/03/23 1233 04/04/23 0454  AST 13*  --   ALT 10  --   ALKPHOS 41  --   BILITOT 0.7  --   PROT 5.3*  --   ALBUMIN 2.6* 2.2*   Recent Labs  Lab 04/03/23 1935  LIPASE 27   No results for input(s): AMMONIA in the last 168 hours. Coagulation Profile: No results for input(s): INR, PROTIME in the last 168 hours. Cardiac Enzymes: No results for input(s): CKTOTAL, CKMB, CKMBINDEX, TROPONINI in the last 168 hours. BNP (last 3 results) No results for input(s): PROBNP in the last 8760 hours. HbA1C: No results for input(s): HGBA1C in the last 72 hours. CBG: No results for input(s): GLUCAP in the last 168 hours. Lipid Profile: No results for input(s): CHOL, HDL, LDLCALC, TRIG, CHOLHDL, LDLDIRECT in the last 72 hours. Thyroid Function Tests: No results for input(s): TSH, T4TOTAL, FREET4, T3FREE, THYROIDAB in the last 72 hours. Anemia Panel: No results for input(s): VITAMINB12, FOLATE, FERRITIN, TIBC, IRON , RETICCTPCT in the last 72 hours. Sepsis  Labs: Recent Labs  Lab 04/03/23 1352 04/03/23 1712  LATICACIDVEN 1.7 1.1    Recent Results (from the past 240 hours)  Blood culture (routine x 2)     Status: None (Preliminary result)   Collection Time: 04/03/23  1:40 PM   Specimen: BLOOD LEFT ARM  Result Value Ref Range Status   Specimen Description   Final    BLOOD LEFT ARM Performed at [facility], [address] [place], [place] [address]    Special Requests   Final    BOTTLES DRAWN AEROBIC AND ANAEROBIC Blood Culture results may not be optimal due to an inadequate volume of blood received in culture bottles Performed at [facility], [address], [place], [place] [address]    Culture   Final    NO GROWTH < 24 HOURS Performed at [facility], [address], [place], [place] [address]  Report Status PENDING  Incomplete  Blood culture (routine x 2)     Status: None (Preliminary result)   Collection Time: 04/03/23  2:09 PM   Specimen: BLOOD  Result Value Ref Range Status   Specimen Description   Final    BLOOD RIGHT ANTECUBITAL Performed at [facility], [address] [place], [place] [address]    Special Requests   Final    BOTTLES DRAWN AEROBIC AND ANAEROBIC Blood Culture adequate volume Performed at [facility], [address], [place], [place] [address]    Culture   Final    NO GROWTH < 24 HOURS Performed at [facility], [address], [place], [place] [address]    Report Status PENDING  Incomplete  Urine Culture (for pregnant, neutropenic or urologic patients or patients with an indwelling urinary catheter)     Status: Abnormal (Preliminary result)   Collection Time: 04/03/23  2:58 PM   Specimen: Urine, Clean Catch  Result Value Ref Range Status   Specimen Description   Final    URINE, CLEAN CATCH Performed at [facility], [address] [place], [place] [address]    Special Requests   Final     NONE Performed at [facility], [address] [place], [place] [address]    Culture (A)  Final    >=100,000 COLONIES/mL ESCHERICHIA COLI SUSCEPTIBILITIES TO FOLLOW Performed at [facility], [address], [place], [place] [address]    Report Status PENDING  Incomplete  MRSA Next Gen by PCR, Nasal     Status: None   Collection Time: 04/03/23  5:28 PM   Specimen: Nasal Mucosa; Nasal Swab  Result Value Ref Range Status   MRSA by PCR Next Gen NOT DETECTED NOT DETECTED Final    Comment: (NOTE) The GeneXpert MRSA Assay (FDA approved for NASAL specimens only), is one component of a comprehensive MRSA colonization surveillance program. It is not intended to diagnose MRSA infection nor to guide or monitor treatment for MRSA infections. Test performance is not FDA approved in patients less than 21 years old. Performed at [facility], [address], [place], [place] [address]          Radiology Studies: CT ANGIO GI BLEED Result Date: 04/03/2023 CLINICAL DATA:  Melena, concern for upper GI bleed, hypotension EXAM: CTA ABDOMEN AND PELVIS WITHOUT AND WITH CONTRAST TECHNIQUE: Multidetector CT imaging of the abdomen and pelvis was performed using the standard protocol during bolus administration of intravenous contrast. Multiplanar reconstructed images and MIPs were obtained and reviewed to evaluate the vascular anatomy. RADIATION DOSE REDUCTION: This exam was performed according to the departmental dose-optimization program which includes automated exposure control, adjustment of the mA and/or kV according to patient size and/or use of iterative reconstruction technique. CONTRAST:  80mL OMNIPAQUE  IOHEXOL  350 MG/ML SOLN COMPARISON:  CT pelvis, 02/02/2023 FINDINGS: VASCULAR Normal contour and caliber of the abdominal aorta. Moderate mixed calcific atherosclerosis. No evidence of aneurysm, dissection, or other acute aortic pathology. Standard branching  pattern of the abdominal aorta with solitary bilateral renal arteries. Review of the MIP images confirms the above findings. NON-VASCULAR Lower Chest: No acute findings. Large hiatal hernia with nearly complete intrathoracic position of the stomach. Coronary artery calcifications. Hepatobiliary: No solid liver abnormality is seen. Gallstones. No gallbladder wall thickening, or biliary dilatation. Pancreas: Diffuse inflammatory fat stranding about the pancreas. No pancreatic parenchymal necrosis or acute pancreatic fluid collection. Spleen: Normal in  size without significant abnormality. Adrenals/Urinary Tract: Adrenal glands are unremarkable. Kidneys are normal, without renal calculi, solid lesion, or hydronephrosis. Bladder is unremarkable. Stomach/Bowel: Stomach is within normal limits. Descending duodenal diverticulum. Evidence of partial right hemicolectomy and ileocolic anastomosis. Diffuse mucosal hyperenhancement of the sigmoid colon and rectum (series 12, image 466). The colon is fluid-filled to the rectum. No directly visualized intraluminal contrast enhancement or other findings to specifically localize GI bleeding. Lymphatic: No enlarged abdominal or pelvic lymph nodes. Reproductive: No mass or other significant abnormality. Other: No abdominal wall hernia or abnormality. No ascites. Musculoskeletal: Subacute, partially callused fractures of the left pubic rami, seen acutely on examination dated 02/02/2023 (series 6, image 170). Status post left hip total arthroplasty. IMPRESSION: 1. No directly visualized intraluminal contrast enhancement or other findings to specifically localize GI bleeding. 2. Diffuse mucosal hyperenhancement of the sigmoid colon and rectum, consistent with nonspecific infectious or inflammatory proctocolitis. The colon is fluid-filled to the rectum in keeping with diarrheal illness. 3. Diffuse inflammatory fat stranding about the pancreas, consistent with acute pancreatitis. No  pancreatic parenchymal necrosis or acute pancreatic fluid collection. 4. Evidence of partial right hemicolectomy and ileocolic anastomosis. 5. Large hiatal hernia with nearly complete intrathoracic position of the stomach. 6. Cholelithiasis. 7. Subacute, partially callused fractures of the left pubic rami, seen acutely on examination dated 02/02/2023. 8. Coronary artery disease. Aortic Atherosclerosis (ICD10-I70.0). Electronically Signed   By: Marolyn JONETTA Jaksch M.D.   On: 04/03/2023 18:51        Scheduled Meds:  amLODipine   5 mg Oral Daily   Chlorhexidine  Gluconate Cloth  6 each Topical Daily   metoprolol  succinate  50 mg Oral Daily   pantoprazole  (PROTONIX ) IV  40 mg Intravenous Q12H   potassium chloride   60 mEq Oral Once   Continuous Infusions:  sodium chloride  100 mL/hr at 04/04/23 1700   acetaminophen  Stopped (04/05/23 0110)   cefTRIAXone  (ROCEPHIN )  IV Stopped (04/04/23 1506)   metronidazole  Stopped (04/04/23 2346)   potassium chloride        LOS: 2 days    Time spent: 35 minutes    Anyae Griffith A Aiva Miskell, MD [facility]   If 7PM-7AM, please contact night-coverage [email or web address]  04/05/2023, 7:59 AM

## 2023-04-05 NOTE — Evaluation (Signed)
 Physical Therapy Evaluation Patient Details Name: Monica Conway MRN: [id number] DOB: 1945-07-09 Today's Date: 04/05/2023  History of Present Illness  78 year old female presents with fatigue, dyspnea on exertion and shortness of breath and admitted for Acute GI bleed,  Acute Blood loss anemia,  Hemorrhagic shock, plus or minus component of septic shock due to UTI.  Past medical history significant for paroxysmal A-fib on Eliquis , CVA right basal ganglia 2024 with no focal deficits, hypertension, osteoporosis, Barrett's esophagus, Lt THA  Clinical Impression  Pt admitted with above diagnosis.  Pt currently with functional limitations due to the deficits listed below (see PT Problem List). Pt will benefit from acute skilled PT to increase their independence and safety with mobility to allow discharge.  Pt disoriented, agitated, restless in bed on arrival.  Pt not agreeable to assist.  Pt's neighbor arrived and reports pt is not at baseline.  Pt lives alone and typically ambulatory with cane.  Pt repositioned with RN (since pt squirming in bed and bringing feet over the edge.  Pt frequently stating don't touch me and get out.  RN reports pt to go for head CT later.  Pt does not appear safe to d/c home alone at this time.  Patient will benefit from continued inpatient follow up therapy, <3 hours/day upon d/c, however will follow along and update if/when pt progresses pending cognition.          If plan is discharge home, recommend the following: A lot of help with walking and/or transfers;A lot of help with bathing/dressing/bathroom;Help with stairs or ramp for entrance;Assistance with cooking/housework;Supervision due to cognitive status   Can travel by private vehicle        Equipment Recommendations Rolling walker (2 wheels) (TBD)  Recommendations for Other Services       Functional Status Assessment Patient has had a recent decline in their functional status and demonstrates the ability to  make significant improvements in function in a reasonable and predictable amount of time.     Precautions / Restrictions Precautions Precautions: Fall Precaution Comments: waist restraint and mittens currently      Mobility  Bed Mobility               General bed mobility comments: pt with waist belt and mittens, pt able to sit upright in bed, squirming in bed and moving extremities well, repositioned with bed pads    Transfers                        Ambulation/Gait                  Stairs            Wheelchair Mobility     Tilt Bed    Modified Rankin (Stroke Patients Only)       Balance                                             Pertinent Vitals/Pain Pain Assessment Pain Assessment: Faces Faces Pain Scale: No hurt    Home Living Family/patient expects to be discharged to:: Private residence Living Arrangements: Alone Available Help at Discharge: Family;Available PRN/intermittently;Neighbor Type of Home: House Home Access: Stairs to enter   [employer] of Steps: 1   Home Layout: One level Home Equipment: Cane - single point Additional Comments: above per recent  admission 2 months ago    Prior Function Prior Level of Function : Patient poor historian/Family not available             Mobility Comments: neighbor reports pt is ambulatory with a cane sometimes ADLs Comments: neighbor reports checking in on her and helping with ADLs 2x/week as needed     Extremity/Trunk Assessment   Upper Extremity Assessment Upper Extremity Assessment: Difficult to assess due to impaired cognition (however moving all extremities full range in bed)    Lower Extremity Assessment Lower Extremity Assessment: Difficult to assess due to impaired cognition       Communication      Cognition Arousal: Alert Behavior During Therapy: Agitated, Restless Overall Cognitive Status: Impaired/Different from  baseline                                 General Comments: neighbor reports pt is not at baseline, pt stating she is at home, agitiated with repositioning, repeatedly states, get out        General Comments      Exercises     Assessment/Plan    PT Assessment Patient needs continued PT services  PT Problem List Decreased mobility;Decreased balance;Decreased activity tolerance;Decreased knowledge of use of DME       PT Treatment Interventions DME instruction;Gait training;Balance training;Functional mobility training;Therapeutic activities;Therapeutic exercise;Patient/family education    PT Goals (Current goals can be found in the Care Plan section)  Acute Rehab PT Goals PT Goal Formulation: Patient unable to participate in goal setting Time For Goal Achievement: 04/19/23 Potential to Achieve Goals: Fair    Frequency Min 1X/week     Co-evaluation               AM-PAC PT 6 Clicks Mobility  Outcome Measure Help needed turning from your back to your side while in a flat bed without using bedrails?: A Little Help needed moving from lying on your back to sitting on the side of a flat bed without using bedrails?: A Little Help needed moving to and from a bed to a chair (including a wheelchair)?: Total Help needed standing up from a chair using your arms (e.g., wheelchair or bedside chair)?: Total Help needed to walk in hospital room?: Total Help needed climbing 3-5 steps with a railing? : Total 6 Click Score: 10    End of Session   Activity Tolerance: Treatment limited secondary to agitation Patient left: in bed;with call bell/phone within reach;with family/visitor present;with nursing/sitter in room;with bed alarm set   PT Visit Diagnosis: Difficulty in walking, not elsewhere classified (R26.2)    Time: 8641-8581 PT Time Calculation (min) (ACUTE ONLY): 20 min   Charges:   PT Evaluation $PT Eval Low Complexity: 1 Low   PT General Charges $$  ACUTE PT VISIT: 1 Visit        Tari KLEIN, DPT Physical Therapist Acute Rehabilitation Services Office: [phone]   Tari CROME [place] 04/05/2023, 3:42 PM

## 2023-04-06 ENCOUNTER — Encounter (HOSPITAL_COMMUNITY): Payer: Self-pay | Admitting: Gastroenterology

## 2023-04-06 DIAGNOSIS — R578 Other shock: Secondary | ICD-10-CM | POA: Diagnosis not present

## 2023-04-06 LAB — BASIC METABOLIC PANEL
Anion gap: 8 (ref 5–15)
BUN: 10 mg/dL (ref 8–23)
CO2: 16 mmol/L — ABNORMAL LOW (ref 22–32)
Calcium: 7.8 mg/dL — ABNORMAL LOW (ref 8.9–10.3)
Chloride: 117 mmol/L — ABNORMAL HIGH (ref 98–111)
Creatinine, Ser: 0.87 mg/dL (ref 0.44–1.00)
GFR, Estimated: >60 mL/min (ref >60)
Glucose, Bld: 85 mg/dL (ref 70–99)
Potassium: 3.3 mmol/L — ABNORMAL LOW (ref 3.5–5.1)
Sodium: 141 mmol/L (ref 135–145)

## 2023-04-06 LAB — BPAM RBC
Blood Product Expiration Date: 202503022359
Blood Product Expiration Date: 202503042359
Blood Product Expiration Date: 202503042359
ISSUE DATE / TIME: 202502071420
ISSUE DATE / TIME: 202502071420
ISSUE DATE / TIME: 202502071420
Unit Type and Rh: 6200
Unit Type and Rh: 6200
Unit Type and Rh: 6200

## 2023-04-06 LAB — TYPE AND SCREEN
ABO/RH(D): A POS
Antibody Screen: NEGATIVE
Unit division: 0
Unit division: 0
Unit division: 0

## 2023-04-06 LAB — CBC
HCT: 28.2 % — ABNORMAL LOW (ref 36.0–46.0)
Hemoglobin: 8.9 g/dL — ABNORMAL LOW (ref 12.0–15.0)
MCH: 28.1 pg (ref 26.0–34.0)
MCHC: 31.6 g/dL (ref 30.0–36.0)
MCV: 89 fL (ref 80.0–100.0)
Platelets: 234 10*3/uL (ref 150–400)
RBC: 3.17 MIL/uL — ABNORMAL LOW (ref 3.87–5.11)
RDW: 17.3 % — ABNORMAL HIGH (ref 11.5–15.5)
WBC: 8.6 10*3/uL (ref 4.0–10.5)
nRBC: 0.5 % — ABNORMAL HIGH (ref 0.0–0.2)

## 2023-04-06 MED ORDER — QUETIAPINE FUMARATE 50 MG PO TABS
25.0000 mg | ORAL_TABLET | Freq: Two times a day (BID) | ORAL | Status: DC
Start: 2023-04-06 — End: 2023-04-06

## 2023-04-06 MED ORDER — SODIUM CHLORIDE 0.9 % IV SOLN
INTRAVENOUS | Status: DC
Start: 2023-04-06 — End: 2023-04-07

## 2023-04-06 MED ORDER — QUETIAPINE FUMARATE 50 MG PO TABS: 25.0000 mg | ORAL_TABLET | Freq: Every day | ORAL | Status: DC

## 2023-04-06 MED ORDER — QUETIAPINE FUMARATE 25 MG PO TABS
25.0000 mg | ORAL_TABLET | Freq: Two times a day (BID) | ORAL | Status: DC
Start: 2023-04-06 — End: 2023-04-09
  Administered 2023-04-06 – 2023-04-09 (×7): 25 mg via ORAL
  Filled 2023-04-06 (×7): qty 1

## 2023-04-06 MED ORDER — PANTOPRAZOLE SODIUM 40 MG PO TBEC
40.0000 mg | DELAYED_RELEASE_TABLET | Freq: Two times a day (BID) | ORAL | Status: DC
  Administered 2023-04-06 – 2023-04-09 (×6): 40 mg via ORAL
  Filled 2023-04-06 (×6): qty 1

## 2023-04-06 MED ORDER — THIAMINE HCL 100 MG/ML IJ SOLN
500.0000 mg | Freq: Three times a day (TID) | INTRAVENOUS | Status: AC
  Administered 2023-04-06 – 2023-04-08 (×6): 500 mg via INTRAVENOUS
  Filled 2023-04-06 (×7): qty 5

## 2023-04-06 MED ORDER — POTASSIUM CHLORIDE 10 MEQ/100ML IV SOLN
10.0000 meq | INTRAVENOUS | Status: AC
  Administered 2023-04-06 (×3): 10 meq via INTRAVENOUS
  Filled 2023-04-06 (×3): qty 100

## 2023-04-06 MED ORDER — SODIUM CHLORIDE 0.9 % IV SOLN
2.0000 g | INTRAVENOUS | Status: DC
  Administered 2023-04-06 – 2023-04-07 (×2): 2 g via INTRAVENOUS
  Filled 2023-04-06 (×2): qty 20

## 2023-04-06 MED ORDER — METRONIDAZOLE 500 MG/100ML IV SOLN
500.0000 mg | Freq: Two times a day (BID) | INTRAVENOUS | Status: DC
  Administered 2023-04-06 – 2023-04-09 (×7): 500 mg via INTRAVENOUS
  Filled 2023-04-06 (×7): qty 100

## 2023-04-06 NOTE — TOC Initial Note (Addendum)
 Transition of Care [facility]) - Initial/Assessment Note    Patient Details  Name: Monica Conway MRN: [id number] Date of Birth: November 10, 1945  [facility] CM/SW Contact:    Marty Sleet, LCSW Phone Number: 04/06/2023, 8:48 AM  Clinical Narrative:                 Pt from home alone. Pt modified independent at baseline. Pt recommended for SNF placement at discharge. Pt disoriented x 4. Left VM w/ pt's POC Dianne Poston.   ADDENDUM: Received notification from [facility] that pt is active with their agency for home health PT,OT, and speech therapy.   Received return call from pt's brother who is agreeable to SNF placement for pt. He shares pt has been to [facility] in the past however, had a bad experience and requested she not been sent to this facility. Referrals for SNF have been faxed out. Pt will have to have 24 hours of no restraints prior to being able to transfer to SNF.   Expected Discharge Plan: Skilled Nursing Facility Barriers to Discharge: Continued Medical Work up   Patient Goals and CMS Choice Patient states their goals for this hospitalization and ongoing recovery are:: Unable to assess          Expected Discharge Plan and Services In-house Referral: Clinical Social Work Discharge Planning Services: NA Post Acute Care Choice:  (TBD) Living arrangements for the past 2 months: Single Family Home                 DME Arranged: N/A DME Agency: NA                  Prior Living Arrangements/Services Living arrangements for the past 2 months: Single Family Home Lives with:: Self Patient language and need for interpreter reviewed:: Yes Do you feel safe going back to the place where you live?: Yes      Need for Family Participation in Patient Care: Yes (Comment) Care giver support system in place?: Yes (comment) Current home services: DME (Cane, walker, wheelchair) Criminal Activity/Legal Involvement Pertinent to Current Situation/Hospitalization: No -  Comment as needed  Activities of Daily Living      Permission Sought/Granted                  Emotional Assessment   Attitude/Demeanor/Rapport: Unable to Assess Affect (typically observed): Unable to Assess Orientation: : Fluctuating Orientation (Suspected and/or reported Sundowners) Alcohol  / Substance Use: Not Applicable Psych Involvement: No (comment)  Admission diagnosis:  GI bleed [K92.2] Patient Active Problem List   Diagnosis Date Noted   GI bleed 04/03/2023   Cerebrovascular accident (CVA) (HCC) 01/31/2023   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 (HCC) 01/26/2023   Paroxysmal atrial fibrillation (HCC) 01/26/2023   Metabolic acidosis 01/26/2023   Acute encephalopathy 01/24/2023   Postoperative anemia due to acute blood loss 08/04/2015   Hypertension    Osteoporosis    Closed left hip fracture (HCC) 08/03/2015   Left displaced femoral neck fracture (HCC) 08/03/2015   Dehydration 02/05/2014   Lower urinary tract infectious disease 02/05/2014   Leukocytosis 02/05/2014   Sepsis (HCC) 02/05/2014   Encephalopathy acute 02/04/2014   SIRS (systemic inflammatory response syndrome) (HCC) 02/04/2014   Elevated LFTs 02/04/2014   PCP:  [facility]:   [id number] Jonette Nestle, [place] - [address] AT [facility] & [address] [place] [address] Phone:  [phone] Fax: [phone]     Social Drivers of Health (SDOH) Social History: SDOH Screenings   Food Insecurity: Patient Unable To Answer (04/05/2023)  Housing: Patient Unable To Answer (04/05/2023)  Transportation Needs: Patient Unable To Answer (04/05/2023)  Utilities: Patient Unable To Answer (04/05/2023)  Social Connections: Unknown (04/05/2023)  Tobacco Use: Low Risk  (04/04/2023)   SDOH Interventions:     Readmission Risk Interventions    04/06/2023    8:44 AM  Readmission Risk Prevention Plan  Transportation  Screening Complete  PCP or Specialist Appt within 3-5 Days Complete  HRI or Home Care Consult Complete  Social Work Consult for Recovery Care Planning/Counseling Complete  Palliative Care Screening Not Applicable  Medication Review Oceanographer) Complete

## 2023-04-06 NOTE — Progress Notes (Signed)
 OT Cancellation Note  Patient Details Name: Monica Conway MRN: [id number] DOB: 08-31-1945   Cancelled Treatment:    Reason Eval/Treat Not Completed: Patient declined, no reason specified Patient is confused reporting name is Monica Conway and declining all tasks therapist attempted with patient preferring to keep eyes closed. OT to continue to follow and check back as schedule will allow.  Wynette Heckler, MS Acute Rehabilitation Department Office# [phone]  04/06/2023, 10:10 AM

## 2023-04-06 NOTE — Plan of Care (Signed)
  Problem: Education: Goal: Knowledge of General Education information will improve Description: Including pain rating scale, medication(s)/side effects and non-pharmacologic comfort measures Outcome: Progressing   Problem: Activity: Goal: Risk for activity intolerance will decrease Outcome: Progressing   Problem: Nutrition: Goal: Adequate nutrition will be maintained Outcome: Progressing   Problem: Elimination: Goal: Will not experience complications related to urinary retention Outcome: Progressing   Problem: Pain Managment: Goal: General experience of comfort will improve and/or be controlled Outcome: Progressing   Problem: Health Behavior/Discharge Planning: Goal: Ability to manage health-related needs will improve Outcome: Not Progressing   Problem: Coping: Goal: Level of anxiety will decrease Outcome: Not Progressing

## 2023-04-06 NOTE — Anesthesia Postprocedure Evaluation (Signed)
 Anesthesia Post Note  Patient: Allan P Kunsman  Procedure(s) Performed: ESOPHAGOGASTRODUODENOSCOPY (EGD)     Patient location during evaluation: Endoscopy Anesthesia Type: MAC Level of consciousness: awake and alert Pain management: pain level controlled Vital Signs Assessment: post-procedure vital signs reviewed and stable Respiratory status: spontaneous breathing, nonlabored ventilation, respiratory function stable and patient connected to nasal cannula oxygen Cardiovascular status: stable and blood pressure returned to baseline Postop Assessment: no apparent nausea or vomiting Anesthetic complications: no   No notable events documented.  Last Vitals:  Vitals:   04/06/23 0500 04/06/23 0600  BP: (!) 134/40 (!) 140/69  Pulse: (!) 129 76  Resp: (!) 33 18  Temp:    SpO2: 96% 98%    Last Pain:  Vitals:   04/06/23 0600  TempSrc:   PainSc: 0-No pain                 Malajah Oceguera S

## 2023-04-06 NOTE — Progress Notes (Signed)
 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  She also developed dysuria and dark stool the night prior to admission.  Last dose of Eliquis  was the morning of 2/7.  She was found to be hypotensive, lethargic, hemoglobin of 4.8, potassium 2.5, bicarb 17, creatinine 1.1, lactic acid 1.7.  She received Kcentra  and 2 units of packed red blood cell.  Started on low-dose peripheral norepinephrine .  GI has been consulted.  She was admitted by CCM to 2/7.  Transfer care to [facility] 2/8.   Assessment & Plan:   Principal Problem:   GI bleed  1-Acute GI bleed Acute Blood loss anemia Hemorrhagic shock, plus or minus component of septic shock due to UTI -Status post reversal Eliquis  with Kcentra  -S/P 3 units of packed red blood cell- CTA Abdomen: proctocolitis. No directly visualized intraluminal contrast enhancement or other findings to specifically localize GI bleeding. Diffuse inflammatory fat stranding about the pancreas, consistent with acute pancreatitis -S/P endoscopy 2/07: Erosive Gastritis,  Non-bleeding gastric ulcer with pigmented material. Non-bleeding duodenal ulcer with no stigmata of  bleeding. -Iron  levels low. She will need IV iron  at some point.   Proctocolitis:  -she report she was having diarrhea prior to admission. No BM since yesterday.  -CT scan shows:  Diffuse mucosal hyperenhancement of the sigmoid colon and rectum, consistent with nonspecific infectious or inflammatory proctocolitis.  -Continue with Ceftriaxone  and  flagyl .  -GI panel pending.   Acute Pancreatitis ? By CT scan.  -Lipase: 27 -Denies abdominal pain.  -Follow  up out patient.   AKI Non-anion gap metabolic acidosis Hypokalemia Continue with IV fluids.   Acute Metabolic Encephalopathy/Hospital Delirium Patient is confuse agitated at times.  Start Seroquel  BID Tx for infection.     PAF Hypertension -Holding eliquis . Follow GI recommendation in regards to eliquis  resumption.  -no further hypotension.  -off pressors.  -Resume lower dose metoprolol .  -Continue with  Norvasc .   Hypocalcemia -replaced  Hypomagnesemia;  Replaced  Hypokalemia:  Replaced.    Anisocoria, left pupil slightly bigger than right. Patient report she was told pupils were different size many years ago by her ophthalmology  Estimated body mass index is 21.37 kg/m as calculated from the following:   Height as of this encounter: 5\' 2"  (1.575 m).   Weight as of this encounter: 53 kg.   DVT prophylaxis: scd Code Status: Full code Family Communication: Care discussed with patient. Lefts message to niece.  Disposition Plan:  Status is: Inpatient Remains inpatient appropriate because: remain inpatient for management of shock.     Consultants:  CCM admitted patient.  GI  Procedures:  Endoscopy   Antimicrobials:    Subjective: She continue to be confuse, declining some care and non cooperative with staff.  She denies abdominal pain.  Had BM.   Objective: Vitals:   04/06/23 0800 04/06/23 0900 04/06/23 1000 04/06/23 1100  BP: (!) 153/64 (!) 165/72 (!) 156/70 (!) 166/94  Pulse: 73 63 70 75  Resp: (!) 29 12 16  (!) 22  Temp: 98.6 F (37 C)     TempSrc: Axillary     SpO2: 99% 98% 98% 98%  Weight:      Height:  Intake/Output Summary (Last 24 hours) at 04/06/2023 1147 Last data filed at 04/06/2023 1100 Gross per 24 hour  Intake 2638.04 ml  Output 900 ml  Net 1738.04 ml   Filed Weights   04/03/23 1217 04/04/23 0500 04/06/23 0500  Weight: 53.5 kg 49 kg 53 kg    Examination:  General exam: NAD Respiratory system: CTA Cardiovascular  system: S 1, S 2 RRR Gastrointestinal system: BS present, soft, nt Central nervous system: alert, confuse.   Data Reviewed: I have personally reviewed following labs and imaging studies  CBC: Recent Labs  Lab 04/03/23 1233 04/03/23 1712 04/03/23 2157 04/04/23 0454 04/04/23 1021 04/04/23 2002 04/05/23 0742 04/06/23 0243  WBC 8.6   < > 10.0 8.1 6.9  --  7.1 8.6  NEUTROABS 6.9  --   --   --   --   --   --   --   HGB 4.8*   < > 9.9* 8.9* 9.0* 8.2* 10.1* 8.9*  HCT 16.6*   < > 30.9* 27.9* 27.5* 26.7* 31.1* 28.2*  MCV 93.8   < > 90.6 89.7 87.3  --  88.6 89.0  PLT 253   < > 167 172 181  --  236 234   < > = values in this interval not displayed.   Basic Metabolic Panel: Recent Labs  Lab 04/03/23 1233 04/03/23 1712 04/04/23 0454 04/05/23 0742 04/06/23 0243  NA 138 141 139 140 141  K 2.5* 3.1* 3.0* 3.8 3.3*  CL 113* 118* 118* 114* 117*  CO2 17* 15* 14* 17* 16*  GLUCOSE 114* 139* 91 88 85  BUN 69* 62* 47* 18 10  CREATININE 1.17* 0.99 0.96 0.83 0.87  CALCIUM  7.7* 7.0* 7.3* 8.2* 7.8*  MG  --  1.3* 2.6*  --   --   PHOS  --   --  2.8  --   --    GFR: Estimated Creatinine Clearance: 43.5 mL/min (by C-G formula based on SCr of 0.87 mg/dL). Liver Function Tests: Recent Labs  Lab 04/03/23 1233 04/04/23 0454  AST 13*  --   ALT 10  --   ALKPHOS 41  --   BILITOT 0.7  --   PROT 5.3*  --   ALBUMIN 2.6* 2.2*   Recent Labs  Lab 04/03/23 1935  LIPASE 27   No results for input(s): "AMMONIA" in the last 168 hours. Coagulation Profile: No results for input(s): "INR", "PROTIME" in the last 168 hours. Cardiac Enzymes: No results for input(s): "CKTOTAL", "CKMB", "CKMBINDEX", "TROPONINI" in the last 168 hours. BNP (last 3 results) No results for input(s): "PROBNP" in the last 8760 hours. HbA1C: No results for input(s): "HGBA1C" in the last 72 hours. CBG: No results for input(s): "GLUCAP" in the last 168 hours. Lipid Profile: No results for input(s): "CHOL", "HDL", "LDLCALC",  "TRIG", "CHOLHDL", "LDLDIRECT" in the last 72 hours. Thyroid Function Tests: No results for input(s): "TSH", "T4TOTAL", "FREET4", "T3FREE", "THYROIDAB" in the last 72 hours. Anemia Panel: Recent Labs    04/05/23 0744  TIBC 281  IRON  17*   Sepsis Labs: Recent Labs  Lab 04/03/23 1352 04/03/23 1712  LATICACIDVEN 1.7 1.1    Recent Results (from the past 240 hours)  Blood culture (routine x 2)     Status: None (Preliminary result)   Collection Time: 04/03/23  1:40 PM   Specimen: BLOOD LEFT ARM  Result Value Ref Range Status   Specimen Description   Final    BLOOD LEFT ARM Performed at [facility]ospital Lab, [address], [place], [place] [address]    Special Requests   Final    BOTTLES DRAWN AEROBIC AND ANAEROBIC Blood Culture results may not be optimal due to an inadequate volume of blood received in culture bottles Performed at [facility] [address], [place], [place] [address]    Culture   Final    NO GROWTH 2 DAYS Performed at [facility], [address], [place], [place] [address]    Report Status PENDING  Incomplete  Blood culture (routine x 2)     Status: None (Preliminary result)   Collection Time: 04/03/23  2:09 PM   Specimen: BLOOD  Result Value Ref Range Status   Specimen Description   Final    BLOOD RIGHT ANTECUBITAL Performed at [facility], [address], [place], [place] [address]    Special Requests   Final    BOTTLES DRAWN AEROBIC AND ANAEROBIC Blood Culture adequate volume Performed at [facility], [address] [place], [place] [address]    Culture   Final    NO GROWTH 2 DAYS Performed at [facility], [address] [place], [place] [address]    Report Status PENDING  Incomplete  Urine Culture (for pregnant, neutropenic or urologic patients or patients with an indwelling urinary catheter)     Status: Abnormal   Collection Time: 04/03/23  2:58 PM   Specimen: Urine,  Clean Catch  Result Value Ref Range Status   Specimen Description   Final    URINE, CLEAN CATCH Performed at [facility], [address], [place], [place] [address]    Special Requests   Final    NONE Performed at [facility], [address] [place], [place] [address]    Culture >=100,000 COLONIES/mL ESCHERICHIA COLI (A)  Final   Report Status 04/05/2023 FINAL  Final   Organism ID, Bacteria ESCHERICHIA COLI (A)  Final      Susceptibility   Escherichia coli - MIC*    AMPICILLIN <=2 SENSITIVE Sensitive     CEFAZOLIN  <=4 SENSITIVE Sensitive     CEFEPIME  <=0.12 SENSITIVE Sensitive     CEFTRIAXONE  <=0.25 SENSITIVE Sensitive     CIPROFLOXACIN <=0.25 SENSITIVE Sensitive     GENTAMICIN <=1 SENSITIVE Sensitive     IMIPENEM <=0.25 SENSITIVE Sensitive     NITROFURANTOIN <=16 SENSITIVE Sensitive     TRIMETH/SULFA <=20 SENSITIVE Sensitive     AMPICILLIN/SULBACTAM <=2 SENSITIVE Sensitive     PIP/TAZO <=4 SENSITIVE Sensitive ug/mL    * >=100,000 COLONIES/mL ESCHERICHIA COLI  MRSA Next Gen by PCR, Nasal     Status: None   Collection Time: 04/03/23  5:28 PM   Specimen: Nasal Mucosa; Nasal Swab  Result Value Ref Range Status   MRSA by PCR Next Gen NOT DETECTED NOT DETECTED Final    Comment: (NOTE) The GeneXpert MRSA Assay (FDA approved for NASAL specimens only), is one component of a comprehensive MRSA colonization surveillance program. It is not intended to diagnose MRSA infection nor to guide or monitor treatment for MRSA infections. Test performance is not FDA approved in patients less than 36 years old. Performed at [facility], [address], [place], [place] [address]          Radiology Studies: No results found.       Scheduled Meds:  amLODipine   5 mg Oral Daily   Chlorhexidine  Gluconate Cloth  6 each Topical  Daily   metoprolol  succinate  50 mg Oral Daily   pantoprazole  (PROTONIX ) IV  40 mg Intravenous Q12H    potassium chloride   60 mEq Oral Once   QUEtiapine   25 mg Oral BID   Continuous Infusions:  sodium chloride  100 mL/hr at 04/06/23 1100   cefTRIAXone  (ROCEPHIN )  IV Stopped (04/06/23 0943)   potassium chloride  10 mEq (04/06/23 1124)   thiamine  (VITAMIN B1) injection Stopped (04/06/23 1015)     LOS: 3 days    Time spent: 35 minutes    Jakyrah Holladay A Alexius Ellington, MD [facility]   If 7PM-7AM, please contact night-coverage [email or web address]  04/06/2023, 11:47 AM

## 2023-04-06 NOTE — NC FL2 (Signed)
 [place]  MEDICAID FL2 LEVEL OF CARE FORM     IDENTIFICATION  Patient Name: Monica Conway Birthdate: [birth year] Sex: female Admission Date (Current Location): 04/03/2023  [facility] and [place] Number:  Producer, television/film/video and Address:  [facility],  [address], [place] [address]      Provider Number: [id number]  Attending Physician Name and Address:  Danette Duos, MD  Relative Name and Phone Number:  Audrene Lease [phone]    Current Level of Care: Hospital Recommended Level of Care: Skilled Nursing Facility Prior Approval Number:    Date Approved/Denied:   PASRR Number: [id number]  Discharge Plan: SNF    Current Diagnoses: Patient Active Problem List   Diagnosis Date Noted   GI bleed 04/03/2023   Cerebrovascular accident (CVA) (HCC) 01/31/2023   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 (HCC) 01/26/2023   Paroxysmal atrial fibrillation (HCC) 01/26/2023   Metabolic acidosis 01/26/2023   Acute encephalopathy 01/24/2023   Postoperative anemia due to acute blood loss 08/04/2015   Hypertension    Osteoporosis    Closed left hip fracture (HCC) 08/03/2015   Left displaced femoral neck fracture (HCC) 08/03/2015   Dehydration 02/05/2014   Lower urinary tract infectious disease 02/05/2014   Leukocytosis 02/05/2014   Sepsis (HCC) 02/05/2014   Encephalopathy acute 02/04/2014   SIRS (systemic inflammatory response syndrome) (HCC) 02/04/2014   Elevated LFTs 02/04/2014    Orientation RESPIRATION BLADDER Height & Weight     Self, Place  Normal Incontinent, External catheter Weight: 116 lb 13.5 oz (53 kg) Height:  5\' 2"  (157.5 cm)  BEHAVIORAL SYMPTOMS/MOOD NEUROLOGICAL BOWEL NUTRITION STATUS      Continent Diet (See discharge summary)  AMBULATORY STATUS COMMUNICATION OF NEEDS Skin   Limited Assist Verbally Normal                       Personal Care Assistance Level of Assistance  Bathing, Dressing, Feeding Bathing  Assistance: Limited assistance Feeding assistance: Limited assistance Dressing Assistance: Limited assistance     Functional Limitations Info  Sight, Hearing, Speech Sight Info: Impaired Hearing Info: Adequate Speech Info: Adequate    SPECIAL CARE FACTORS FREQUENCY  PT (By licensed PT), OT (By licensed OT)     PT Frequency: 5x/wk OT Frequency: 5x/wk            Contractures Contractures Info: Not present    Additional Factors Info  Code Status, Allergies, Psychotropic Code Status Info: DNR Allergies Info: Pollen Extract, Amlodipine -atorvastatin Psychotropic Info: See MAR         Current Medications (04/06/2023):  This is the current hospital active medication list Current Facility-Administered Medications  Medication Dose Route Frequency Provider Last Rate Last Admin   0.9 %  sodium chloride  infusion   Intravenous Continuous Regalado, Belkys A, MD 100 mL/hr at 04/06/23 1200 Infusion Verify at 04/06/23 1200   albuterol  (PROVENTIL ) (2.5 MG/3ML) 0.083% nebulizer solution 2.5 mg  2.5 mg Nebulization Q4H PRN Ozell Blunt, MD       amLODipine  (NORVASC ) tablet 5 mg  5 mg Oral Daily Regalado, Belkys A, MD   5 mg at 04/06/23 0917   cefTRIAXone  (ROCEPHIN ) 2 g in sodium chloride  0.9 % 100 mL IVPB  2 g Intravenous Q24H Regalado, Belkys A, MD   Stopped at 04/06/23 0943   Chlorhexidine  Gluconate Cloth 2 % PADS 6 each  6 each Topical Daily Ozell Blunt, MD   6 each at 04/05/23 2300   hydrALAZINE  (APRESOLINE )  injection 2 mg  2 mg Intravenous Q6H PRN Ozell Blunt, MD   2 mg at 04/05/23 1741   metoprolol  succinate (TOPROL -XL) 24 hr tablet 50 mg  50 mg Oral Daily Regalado, Belkys A, MD   50 mg at 04/06/23 1610   metroNIDAZOLE  (FLAGYL ) IVPB 500 mg  500 mg Intravenous Q12H Regalado, Belkys A, MD 100 mL/hr at 04/06/23 1324 500 mg at 04/06/23 1324   pantoprazole  (PROTONIX ) EC tablet 40 mg  40 mg Oral BID Ozell Blunt, MD       potassium chloride  SA (KLOR-CON  M) CR tablet 60 mEq  60 mEq Oral Once  Magod, Marc, MD       QUEtiapine  (SEROQUEL ) tablet 25 mg  25 mg Oral BID Regalado, Belkys A, MD   25 mg at 04/06/23 1322   thiamine  (VITAMIN B1) 500 mg in sodium chloride  0.9 % 50 mL IVPB  500 mg Intravenous TID Regalado, Belkys A, MD   Stopped at 04/06/23 1015     Discharge Medications: Please see discharge summary for a list of discharge medications.  Relevant Imaging Results:  Relevant Lab Results:   Additional Information SSN: [id number]  Marty Sleet, LCSW

## 2023-04-06 NOTE — Plan of Care (Signed)
 Patient extremely agitated, yelling and pulling at lines trying to get out of bed, posey belt on along with mittens, unable to do education with patient due to delirium and confusion. Bed in the lowest position with bed alarm on, side rails up and call bell in reach, although there's orders for sitter, due to staffing issues no sitter is available, patient room is directly in front of nursing station, along with this nurse and other nursing staff patient is being consisently monitor. IVF infusing and all monitoring devices actively working.  Problem: Health Behavior/Discharge Planning: Goal: Ability to manage health-related needs will improve Outcome: Progressing   Problem: Clinical Measurements: Goal: Ability to maintain clinical measurements within normal limits will improve Outcome: Progressing Goal: Will remain free from infection Outcome: Progressing Goal: Diagnostic test results will improve Outcome: Progressing Goal: Respiratory complications will improve Outcome: Progressing Goal: Cardiovascular complication will be avoided Outcome: Progressing   Problem: Activity: Goal: Risk for activity intolerance will decrease Outcome: Progressing   Problem: Nutrition: Goal: Adequate nutrition will be maintained Outcome: Progressing   Problem: Coping: Goal: Level of anxiety will decrease Outcome: Progressing   Problem: Elimination: Goal: Will not experience complications related to bowel motility Outcome: Progressing Goal: Will not experience complications related to urinary retention Outcome: Progressing   Problem: Pain Managment: Goal: General experience of comfort will improve and/or be controlled Outcome: Progressing   Problem: Safety: Goal: Ability to remain free from injury will improve Outcome: Progressing   Problem: Skin Integrity: Goal: Risk for impaired skin integrity will decrease Outcome: Progressing   Problem: Safety: Goal: Non-violent Restraint(s) Outcome:  Progressing

## 2023-04-07 DIAGNOSIS — Z7901 Long term (current) use of anticoagulants: Secondary | ICD-10-CM

## 2023-04-07 DIAGNOSIS — R578 Other shock: Secondary | ICD-10-CM | POA: Diagnosis not present

## 2023-04-07 LAB — GASTROINTESTINAL PANEL BY PCR, STOOL (REPLACES STOOL CULTURE)

## 2023-04-07 LAB — BASIC METABOLIC PANEL
Anion gap: 7 (ref 5–15)
BUN: 5 mg/dL — ABNORMAL LOW (ref 8–23)
CO2: 16 mmol/L — ABNORMAL LOW (ref 22–32)
Calcium: 7.4 mg/dL — ABNORMAL LOW (ref 8.9–10.3)
Chloride: 117 mmol/L — ABNORMAL HIGH (ref 98–111)
Creatinine, Ser: 0.73 mg/dL (ref 0.44–1.00)
GFR, Estimated: >60 mL/min (ref >60)
Glucose, Bld: 91 mg/dL (ref 70–99)
Potassium: 2.9 mmol/L — ABNORMAL LOW (ref 3.5–5.1)
Sodium: 140 mmol/L (ref 135–145)

## 2023-04-07 LAB — CBC
HCT: 27.8 % — ABNORMAL LOW (ref 36.0–46.0)
Hemoglobin: 8.5 g/dL — ABNORMAL LOW (ref 12.0–15.0)
MCH: 28.4 pg (ref 26.0–34.0)
MCHC: 30.6 g/dL (ref 30.0–36.0)
MCV: 93 fL (ref 80.0–100.0)
Platelets: 263 10*3/uL (ref 150–400)
RBC: 2.99 MIL/uL — ABNORMAL LOW (ref 3.87–5.11)
RDW: 18 % — ABNORMAL HIGH (ref 11.5–15.5)
WBC: 7.6 10*3/uL (ref 4.0–10.5)
nRBC: 0.3 % — ABNORMAL HIGH (ref 0.0–0.2)

## 2023-04-07 MED ORDER — POTASSIUM CHLORIDE 10 MEQ/100ML IV SOLN
10.0000 meq | INTRAVENOUS | Status: AC
  Administered 2023-04-07 (×2): 10 meq via INTRAVENOUS
  Filled 2023-04-07 (×2): qty 100

## 2023-04-07 MED ORDER — POTASSIUM CHLORIDE 10 MEQ/100ML IV SOLN
10.0000 meq | INTRAVENOUS | Status: AC
  Administered 2023-04-07: 10 meq via INTRAVENOUS
  Filled 2023-04-07: qty 100

## 2023-04-07 MED ORDER — SODIUM CHLORIDE 0.9 % IV SOLN
INTRAVENOUS | Status: DC
Start: 2023-04-07 — End: 2023-04-08

## 2023-04-07 MED ORDER — HYALURONIDASE HUMAN 150 UNIT/ML IJ SOLN
150.0000 [IU] | Freq: Once | INTRAMUSCULAR | Status: AC
  Administered 2023-04-07: 150 [IU] via SUBCUTANEOUS
  Filled 2023-04-07: qty 1

## 2023-04-07 MED ORDER — CARMEX CLASSIC LIP BALM EX OINT: TOPICAL_OINTMENT | CUTANEOUS | Status: DC | PRN

## 2023-04-07 MED ORDER — POTASSIUM CHLORIDE CRYS ER 20 MEQ PO TBCR
40.0000 meq | EXTENDED_RELEASE_TABLET | Freq: Once | ORAL | Status: AC
  Administered 2023-04-07: 40 meq via ORAL
  Filled 2023-04-07: qty 2

## 2023-04-07 MED ORDER — POTASSIUM CHLORIDE 10 MEQ/100ML IV SOLN
10.0000 meq | Freq: Once | INTRAVENOUS | Status: AC
  Administered 2023-04-07: 10 meq via INTRAVENOUS
  Filled 2023-04-07: qty 100

## 2023-04-07 NOTE — TOC Progression Note (Addendum)
Transition of Care [facility]) - Progression Note   Patient Details  Name: Monica Conway MRN: [id number] Date of Birth: October 08, 1945  Transition of Care [facility]) CM/SW Contact  Ewing Schlein, LCSW Phone Number: 04/07/2023, 10:58 AM  Clinical Narrative: Patient was in Posey restraints this morning. Patient received the following bed offers:  [facility] [address] [place], [place] [address] [phone] Overall rating ?? Much below average  [facility] [address] [place], [place] [address] [phone] Overall rating?? Below average  [facility] and [facility] [address] [place], [place] [address] [phone] Overall rating ? Much below average  [facility] [address] [place], [place] [address] [phone] Overall rating ?? Below average  [facility] [address] [place], [place] [address] [phone] Overall rating ??? Average  [facility] and [facility] [address] [place], [place] [address] [phone] Overall rating ?? Much below average  [facility] [address] [place], [place] [address] [phone] Overall rating ???? Above average  [facility] [address] [place], [place] [address] [phone] Overall rating ? Much below average  [facility] and [facility] [address] [place], [place] [address] [phone] Overall rating ? Much below average  CSW left voicemails for brother and sister-in-law requesting call back regarding bed offers as they are not on site.  Addendum: CSW spoke with patient's brother, Monica Conway, and he reported he will be visiting the patient tomorrow morning so he would like a copy of the bed offers at that time.  Expected Discharge Plan: Skilled Nursing Facility Barriers to Discharge: Continued Medical  Work up  Expected Discharge Plan and Services In-house Referral: Clinical Social Work Discharge Planning Services: NA Post Acute Care Choice:  (TBD) Living arrangements for the past 2 months: Single Family Home             DME Arranged: N/A DME Agency: NA  Social Determinants of Health (SDOH) Interventions SDOH Screenings   Food Insecurity: Patient Unable To Answer (04/05/2023)  Housing: Patient Unable To Answer (04/05/2023)  Transportation Needs: Patient Unable To Answer (04/05/2023)  Utilities: Patient Unable To Answer (04/05/2023)  Social Connections: Unknown (04/05/2023)  Tobacco Use: Low Risk  (04/04/2023)   Readmission Risk Interventions    04/06/2023    8:44 AM  Readmission Risk Prevention Plan  Transportation Screening Complete  PCP or Specialist Appt within 3-5 Days Complete  HRI or Home Care Consult Complete  Social Work Consult for Recovery Care Planning/Counseling Complete  Palliative Care Screening Not Applicable  Medication Review [employer]) Complete

## 2023-04-07 NOTE — Progress Notes (Signed)
12:55 PM Patient calling out in pain. Patient in chair, complains of L AC PIV with severe burning. Assessed site and it is without blood return and painful to flush (was  not painful or swollen on assessment in AM). Minor swelling around IV site.  Pharmacy and Regalado MD- notified and orders placed for Hylenex injections. Competed according to order. Heat compress applied.  IV team consult placed for additional access to complete K runs and IV fluids.   1600: Reassessed site and swelling has decreased. Patient states area is not as tender as earlier and pain is improved.  24 g placed by IV team- flushes well and good blood return.

## 2023-04-07 NOTE — Evaluation (Signed)
Occupational Therapy Evaluation Patient Details Name: Monica Conway MRN: [id number] DOB: 1945-05-23 Today's Date: 04/07/2023   History of Present Illness   History of Present Illness: 78 year old female presents with fatigue, dyspnea on exertion and shortness of breath and admitted for Acute GI bleed,  Acute Blood loss anemia,  Hemorrhagic shock, plus or minus component of septic shock due to UTI.  Past medical history significant for paroxysmal A-fib on Eliquis, CVA right basal ganglia 2024 with no focal deficits, hypertension, osteoporosis, Barrett's esophagus, Lt THA     Clinical Impressions Pt presents with decline in function and safety with with ADLs and ADL mobility with impaired strength, balance, endurance and cognition. Pt is confused and poor hisotorian. PLOF gathered for recent previous chart. Per previous chart, neighbor checks in on pt and assists with ADLs 2x/wk. Pt reports that she was Ind but has "4 helpers" and that pt was ambulatory with a cane sometimes, pt reports that she doesn't use a RW but RN reports that pt told her she uses a Product manager. Pt currently requires min A wth UB ADLs, total A with LB ADLs and toileting. Pt's sit - stand from bed to RW mod A +2, pt kept stating, "this isn't gonna work" and wanted to sit back on EOB. Sit - stand/SPT to [facility] 2 person HHA mod A +2, sit - stand from [facility] mod-min A. Pt fearful of falling and wants 2 people to assist her before she will attempt mobility. Recommend short term rehab at [facility] after acute stay d/c. OT will follow acutely to maximize level of function and safety     If plan is discharge home, recommend the following:   A lot of help with bathing/dressing/bathroom;A lot of help with walking and/or transfers;Assistance with cooking/housework;Assist for transportation;Supervision due to cognitive status;Direct supervision/assist for medications management;Help with stairs or ramp for entrance;Direct supervision/assist for financial  management     Functional Status Assessment   Patient has had a recent decline in their functional status and demonstrates the ability to make significant improvements in function in a reasonable and predictable amount of time.     Equipment Recommendations   Other (comment) (TBD at next level of care)     Recommendations for Other Services         Precautions/Restrictions   Precautions Precautions: Fall Precaution/Restrictions Comments: waist restraint and mittens currently Restrictions Weight Bearing Restrictions Per Provider Order: No     Mobility Bed Mobility Overal bed mobility: Needs Assistance Bed Mobility: Supine to Sit     Supine to sit: Contact guard, Supervision     General bed mobility comments: np physical assist required. Required cues with increased time and effort required for initiation, task continuation and completion    Transfers Overall transfer level: Needs assistance Equipment used: Rolling walker (2 wheels), 2 person hand held assist Transfers: Sit to/from Stand, Bed to chair/wheelchair/BSC Sit to Stand: Mod assist Stand pivot transfers: Mod assist, +2 physical assistance         General transfer comment: sit - stand from bed to RW mod A +2, pt kept stating, "this isn't gonna work" and wanted to sit back on EOB. Sit - stand/SPT to [facility] 2 person HHA mod A +2, sit - stand from [facility] mod-min A. Pt fearful of falling and wants 2 people to assist her      Balance  ADL either performed or assessed with clinical judgement   ADL Overall ADL's : Needs assistance/impaired Eating/Feeding: Set up   Grooming: Wash/dry hands;Wash/dry face;Contact guard assist;Sitting   Upper Body Bathing: Minimal assistance;Sitting   Lower Body Bathing: Total assistance   Upper Body Dressing : Minimal assistance   Lower Body Dressing: Total assistance   Toilet Transfer: Moderate assistance;+2  for physical assistance;Stand-pivot;BSC/3in1;Cueing for safety;Cueing for sequencing   Toileting- Clothing Manipulation and Hygiene: Total assistance;Sit to/from stand       Functional mobility during ADLs: Moderate assistance;Minimal assistance;+2 for physical assistance;Cueing for safety;Cueing for sequencing General ADL Comments: pt confused, becomes agitated with questions     Vision Ability to See in Adequate Light: 0 Adequate Patient Visual Report: No change from baseline       Perception         Praxis         Pertinent Vitals/Pain Pain Assessment Pain Assessment: No/denies pain Faces Pain Scale: No hurt Pain Intervention(s): Monitored during session, Repositioned     Extremity/Trunk Assessment Upper Extremity Assessment Upper Extremity Assessment: Generalized weakness   Lower Extremity Assessment Lower Extremity Assessment: Defer to PT evaluation       Communication Communication Communication: No apparent difficulties   Cognition Arousal: Alert Behavior During Therapy: Restless, Agitated Cognition: Cognition impaired, No family/caregiver present to determine baseline   Orientation impairments: Place, Time, Situation Awareness: Intellectual awareness impaired Memory impairment (select all impairments): Short-term memory   Executive functioning impairment (select all impairments): Initiation, Sequencing, Reasoning, Problem solving                   Following commands: Impaired Following commands impaired: Follows one step commands inconsistently     Cueing  General Comments   Cueing Techniques: Verbal cues;Tactile cues      Exercises     Shoulder Instructions      Home Living Family/patient expects to be discharged to:: Private residence Living Arrangements: Alone Available Help at Discharge: Family;Available PRN/intermittently;Neighbor Type of Home: House Home Access: Stairs to enter Secretary/administrator of Steps: 1   Home  Layout: One level     Bathroom Shower/Tub: Chief Strategy Officer: Standard     Home Equipment: [employer] - single point   Additional Comments: above info per recent admission 2 months ago      Prior Functioning/Environment Prior Level of Function : Patient poor historian/Family not available             Mobility Comments: per previous chart, neighbor reports pt is ambulatory with a cane sometimes, pt reports that she doesn't use a RW but RN reports that pt told her she uses a rollater ADLs Comments: per previous chart, neighbor checks in on pt and assists with ADLs 2x/wk. Pt reports that she was Ind but has "4 helpers"    OT Problem List: Decreased strength;Impaired balance (sitting and/or standing);Decreased cognition;Decreased safety awareness;Decreased activity tolerance;Decreased coordination;Decreased knowledge of use of DME or AE   OT Treatment/Interventions: Self-care/ADL training;DME and/or AE instruction;Therapeutic activities;Balance training;Therapeutic exercise;Patient/family education      OT Goals(Current goals can be found in the care plan section)   Acute Rehab OT Goals Patient Stated Goal: none stated OT Goal Formulation: Patient unable to participate in goal setting Time For Goal Achievement: 04/21/23 Potential to Achieve Goals: Good ADL Goals Pt Will Perform Grooming: with supervision;with set-up;sitting Pt Will Perform Upper Body Bathing: with contact guard assist;with supervision;sitting Pt Will Perform Lower Body Bathing: with max assist;with mod assist;sitting/lateral  leans Pt Will Perform Upper Body Dressing: with contact guard assist;with supervision;sitting Pt Will Transfer to Toilet: with mod assist;with min assist;ambulating;stand pivot transfer;bedside commode;grab bars Pt Will Perform Toileting - Clothing Manipulation and hygiene: with max assist;with mod assist;sitting/lateral leans;sit to/from stand   OT Frequency:  Min 1X/week     Co-evaluation PT/OT/SLP Co-Evaluation/Treatment: Yes Reason for Co-Treatment: For patient/therapist safety;Necessary to address cognition/behavior during functional activity;To address functional/ADL transfers   OT goals addressed during session: ADL's and self-care;Proper use of Adaptive equipment and DME      AM-PAC OT "6 Clicks" Daily Activity     Outcome Measure Help from another person eating meals?: A Little Help from another person taking care of personal grooming?: A Little Help from another person toileting, which includes using toliet, bedpan, or urinal?: Total Help from another person bathing (including washing, rinsing, drying)?: A Lot Help from another person to put on and taking off regular upper body clothing?: A Little Help from another person to put on and taking off regular lower body clothing?: Total 6 Click Score: 13   End of Session Equipment Utilized During Treatment: Gait belt;Rolling walker (2 wheels);Other (comment) [facility]) Nurse Communication: Mobility status  Activity Tolerance: Patient tolerated treatment well Patient left: in chair;with call bell/phone within reach;with chair alarm set  OT Visit Diagnosis: Unsteadiness on feet (R26.81);Other abnormalities of gait and mobility (R26.89);Muscle weakness (generalized) (M62.81)                Time: 1610-9604 OT Time Calculation (min): 34 min Charges:  OT General Charges $OT Visit: 1 Visit OT Evaluation $OT Eval Moderate Complexity: 1 Mod   Galen Manila 04/07/2023, 2:04 PM

## 2023-04-07 NOTE — Progress Notes (Signed)
PROGRESS NOTE    Monica Conway  ZOX:[id number] DOB: [birth year] DOA: 04/03/2023 PCP: [facility]   Brief Narrative: 78 year old with past medical history significant for paroxysmal A-fib on Eliquis, CVA right basal ganglia 2024 with no focal deficits, hypertension, osteoporosis, Barrett's esophagus presents with fatigue, dyspnea on exertion and shortness of breath for the last 2 days.  She also developed dysuria and dark stool the night prior to admission.  Last dose of Eliquis was the morning of 2/7.  She was found to be hypotensive, lethargic, hemoglobin of 4.8, potassium 2.5, bicarb 17, creatinine 1.1, lactic acid 1.7.  She received Kcentra and 2 units of packed red blood cell.  Started on low-dose peripheral norepinephrine.  GI has been consulted.  She was admitted by CCM to 2/7.  Transfer care to [facility] 2/8.   Assessment & Plan:   Principal Problem:   GI bleed  1-Acute GI bleed Acute Blood loss anemia Hemorrhagic shock, plus or minus component of septic shock due to UTI -Status post reversal Eliquis with Kcentra -S/P 3 units of packed red blood cell- CTA Abdomen: proctocolitis. No directly visualized intraluminal contrast enhancement or other findings to specifically localize GI bleeding. Diffuse inflammatory fat stranding about the pancreas, consistent with acute pancreatitis -S/P endoscopy 2/07: Erosive Gastritis,  Non-bleeding gastric ulcer with pigmented material. Non-bleeding duodenal ulcer with no stigmata of  bleeding. -Iron levels low. She will need IV iron, her IV got infiltrated today, would do IV iron tomorrow.  Hb stable.  Advanced diet to Soft today   Proctocolitis:  -she report she was having diarrhea prior to admission. No BM since yesterday.  -CT scan shows:  Diffuse mucosal hyperenhancement of the sigmoid colon and rectum, consistent with nonspecific infectious or inflammatory proctocolitis.  -Continue with Ceftriaxone and  flagyl day 4/7 -GI panel  negative.   Acute Pancreatitis ? By CT scan.  -Lipase: 27 -Denies abdominal pain.  -Follow up out patient.   AKI Non-anion gap metabolic acidosis Hypokalemia Replete IV and oral IV fluids.   IV Potassium Infiltration;  Hyaluronidase subcutaneous injection x 1  Acute Metabolic Encephalopathy/Hospital Delirium Patient is confuse agitated at times.  Start Seroquel BID Tx for infection.  Appears less agitated today.  IV Thiamine.   PAF Hypertension -Holding eliquis. Follow GI recommendation in regards to eliquis resumption.  -no further hypotension.  -off pressors.  -Resume lower dose metoprolol.  -Continue with  Norvasc.   Hypocalcemia -replaced  Hypomagnesemia;  Replaced  Anisocoria, left pupil slightly bigger than right. Patient report she was told pupils were different size many years ago by her ophthalmology  Estimated body mass index is 22.22 kg/m as calculated from the following:   Height as of this encounter: 5\' 2"  (1.575 m).   Weight as of this encounter: 55.1 kg.   DVT prophylaxis: scd Code Status: Full code Family Communication: Care discussed with Niece 2/10 Disposition Plan:  Status is: Inpatient Remains inpatient appropriate because: remain inpatient for management of shock.     Consultants:  CCM admitted patient.  GI  Procedures:  Endoscopy   Antimicrobials:    Subjective: She is alert, had 2 BM last night. Less confuse.    Objective: Vitals:   04/07/23 0411 04/07/23 0412 04/07/23 1000 04/07/23 1246  BP: (!) 125/57   127/72  Pulse: 69   76  Resp: 18  19 16   Temp: 99.3 F (37.4 C)   98.3 F (36.8 C)  TempSrc: Axillary   Oral  SpO2: 97%  100%  Weight:  55.1 kg    Height:        Intake/Output Summary (Last 24 hours) at 04/07/2023 1514 Last data filed at 04/07/2023 8295 Gross per 24 hour  Intake 1627.44 ml  Output --  Net 1627.44 ml   Filed Weights   04/04/23 0500 04/06/23 0500 04/07/23 0412  Weight: 49 kg 53 kg 55.1 kg     Examination:  General exam: NAD Respiratory system: CTA Cardiovascular system: S 1, S 2 RRR Gastrointestinal system: BS present, soft nt Central nervous system: alert follows command Data Reviewed: I have personally reviewed following labs and imaging studies  CBC: Recent Labs  Lab 04/03/23 1233 04/03/23 1712 04/04/23 0454 04/04/23 1021 04/04/23 2002 04/05/23 0742 04/06/23 0243 04/07/23 0424  WBC 8.6   < > 8.1 6.9  --  7.1 8.6 7.6  NEUTROABS 6.9  --   --   --   --   --   --   --   HGB 4.8*   < > 8.9* 9.0* 8.2* 10.1* 8.9* 8.5*  HCT 16.6*   < > 27.9* 27.5* 26.7* 31.1* 28.2* 27.8*  MCV 93.8   < > 89.7 87.3  --  88.6 89.0 93.0  PLT 253   < > 172 181  --  236 234 263   < > = values in this interval not displayed.   Basic Metabolic Panel: Recent Labs  Lab 04/03/23 1712 04/04/23 0454 04/05/23 0742 04/06/23 0243 04/07/23 0809  NA 141 139 140 141 140  K 3.1* 3.0* 3.8 3.3* 2.9*  CL 118* 118* 114* 117* 117*  CO2 15* 14* 17* 16* 16*  GLUCOSE 139* 91 88 85 91  BUN 62* 47* 18 10 5*  CREATININE 0.99 0.96 0.83 0.87 0.73  CALCIUM 7.0* 7.3* 8.2* 7.8* 7.4*  MG 1.3* 2.6*  --   --   --   PHOS  --  2.8  --   --   --    GFR: Estimated Creatinine Clearance: 46.6 mL/min (by C-G formula based on SCr of 0.73 mg/dL). Liver Function Tests: Recent Labs  Lab 04/03/23 1233 04/04/23 0454  AST 13*  --   ALT 10  --   ALKPHOS 41  --   BILITOT 0.7  --   PROT 5.3*  --   ALBUMIN 2.6* 2.2*   Recent Labs  Lab 04/03/23 1935  LIPASE 27   No results for input(s): "AMMONIA" in the last 168 hours. Coagulation Profile: No results for input(s): "INR", "PROTIME" in the last 168 hours. Cardiac Enzymes: No results for input(s): "CKTOTAL", "CKMB", "CKMBINDEX", "TROPONINI" in the last 168 hours. BNP (last 3 results) No results for input(s): "PROBNP" in the last 8760 hours. HbA1C: No results for input(s): "HGBA1C" in the last 72 hours. CBG: No results for input(s): "GLUCAP" in the last  168 hours. Lipid Profile: No results for input(s): "CHOL", "HDL", "LDLCALC", "TRIG", "CHOLHDL", "LDLDIRECT" in the last 72 hours. Thyroid Function Tests: No results for input(s): "TSH", "T4TOTAL", "FREET4", "T3FREE", "THYROIDAB" in the last 72 hours. Anemia Panel: Recent Labs    04/05/23 0744  TIBC 281  IRON 17*   Sepsis Labs: Recent Labs  Lab 04/03/23 1352 04/03/23 1712  LATICACIDVEN 1.7 1.1    Recent Results (from the past 240 hours)  Blood culture (routine x 2)     Status: None (Preliminary result)   Collection Time: 04/03/23  1:40 PM   Specimen: BLOOD LEFT ARM  Result Value Ref Range Status  Specimen Description   Final    BLOOD LEFT ARM Performed at [facility], [address], [place], [place] [address]    Special Requests   Final    BOTTLES DRAWN AEROBIC AND ANAEROBIC Blood Culture results may not be optimal due to an inadequate volume of blood received in culture bottles Performed at [facility], [address], [place], [place] [address]    Culture   Final    NO GROWTH 4 DAYS Performed at [facility], [address], [place], [place] [address]    Report Status PENDING  Incomplete  Blood culture (routine x 2)     Status: None (Preliminary result)   Collection Time: 04/03/23  2:09 PM   Specimen: BLOOD  Result Value Ref Range Status   Specimen Description   Final    BLOOD RIGHT ANTECUBITAL Performed at [facility], [address] [place], [place] [address]    Special Requests   Final    BOTTLES DRAWN AEROBIC AND ANAEROBIC Blood Culture adequate volume Performed at [facility], [address], [place], [place] [address]    Culture   Final    NO GROWTH 4 DAYS Performed at [facility], [address] [place], [place] [address]    Report Status PENDING  Incomplete  Urine Culture (for pregnant, neutropenic or urologic patients or patients with an indwelling urinary catheter)      Status: Abnormal   Collection Time: 04/03/23  2:58 PM   Specimen: Urine, Clean Catch  Result Value Ref Range Status   Specimen Description   Final    URINE, CLEAN CATCH Performed at [facility], [address] [place], [place] [address]    Special Requests   Final    NONE Performed at [facility], [address] [place], [place] [address]    Culture >=100,000 COLONIES/mL ESCHERICHIA COLI (A)  Final   Report Status 04/05/2023 FINAL  Final   Organism ID, Bacteria ESCHERICHIA COLI (A)  Final      Susceptibility   Escherichia coli - MIC*    AMPICILLIN <=2 SENSITIVE Sensitive     CEFAZOLIN <=4 SENSITIVE Sensitive     CEFEPIME <=0.12 SENSITIVE Sensitive     CEFTRIAXONE <=0.25 SENSITIVE Sensitive     CIPROFLOXACIN <=0.25 SENSITIVE Sensitive     GENTAMICIN <=1 SENSITIVE Sensitive     IMIPENEM <=0.25 SENSITIVE Sensitive     NITROFURANTOIN <=16 SENSITIVE Sensitive     TRIMETH/SULFA <=20 SENSITIVE Sensitive     AMPICILLIN/SULBACTAM <=2 SENSITIVE Sensitive     PIP/TAZO <=4 SENSITIVE Sensitive ug/mL    * >=100,000 COLONIES/mL ESCHERICHIA COLI  MRSA Next Gen by PCR, Nasal     Status: None   Collection Time: 04/03/23  5:28 PM   Specimen: Nasal Mucosa; Nasal Swab  Result Value Ref Range Status   MRSA by PCR Next Gen NOT DETECTED NOT DETECTED Final    Comment: (NOTE) The GeneXpert MRSA Assay (FDA approved for NASAL specimens only), is one component of a comprehensive MRSA colonization surveillance program. It is not intended to diagnose MRSA infection nor to guide or monitor treatment for MRSA infections. Test performance is not FDA approved in patients less than 83 years old. Performed at [facility], [address] [place], [place] [address]   Gastrointestinal Panel by PCR , Stool     Status: None   Collection Time: 04/06/23 11:44 AM  Specimen: Stool  Result Value Ref Range Status   Campylobacter species NOT DETECTED NOT  DETECTED Final   Plesimonas shigelloides NOT DETECTED NOT DETECTED Final   Salmonella species NOT DETECTED NOT DETECTED Final   Yersinia enterocolitica NOT DETECTED NOT DETECTED Final   Vibrio species NOT DETECTED NOT DETECTED Final   Vibrio cholerae NOT DETECTED NOT DETECTED Final   Enteroaggregative E coli (EAEC) NOT DETECTED NOT DETECTED Final   Enteropathogenic E coli (EPEC) NOT DETECTED NOT DETECTED Final   Enterotoxigenic E coli (ETEC) NOT DETECTED NOT DETECTED Final   Shiga like toxin producing E coli (STEC) NOT DETECTED NOT DETECTED Final   Shigella/Enteroinvasive E coli (EIEC) NOT DETECTED NOT DETECTED Final   Cryptosporidium NOT DETECTED NOT DETECTED Final   Cyclospora cayetanensis NOT DETECTED NOT DETECTED Final   Entamoeba histolytica NOT DETECTED NOT DETECTED Final   Giardia lamblia NOT DETECTED NOT DETECTED Final   Adenovirus F40/41 NOT DETECTED NOT DETECTED Final   Astrovirus NOT DETECTED NOT DETECTED Final   Norovirus GI/GII NOT DETECTED NOT DETECTED Final   Rotavirus A NOT DETECTED NOT DETECTED Final   Sapovirus (I, II, IV, and V) NOT DETECTED NOT DETECTED Final    Comment: Performed at [facility], [address] [place], [place] [address]         Radiology Studies: No results found.       Scheduled Meds:  amLODipine  5 mg Oral Daily   Chlorhexidine Gluconate Cloth  6 each Topical Daily   metoprolol succinate  50 mg Oral Daily   pantoprazole  40 mg Oral BID   QUEtiapine  25 mg Oral BID   Continuous Infusions:  sodium chloride 100 mL/hr at 04/07/23 1000   cefTRIAXone (ROCEPHIN)  IV 2 g (04/07/23 1051)   metronidazole 500 mg (04/07/23 1051)   thiamine (VITAMIN B1) injection 500 mg (04/07/23 1219)     LOS: 4 days    Time spent: 35 minutes    Amahd Morino A Iana Buzan, MD [facility]   If 7PM-7AM, please contact night-coverage [email or web address]  04/07/2023, 3:14 PM

## 2023-04-07 NOTE — Progress Notes (Signed)
Physical Therapy Treatment Patient Details Name: LENORIA NARINE MRN: [id number] DOB: February 20, 1946 Today's Date: 04/07/2023   History of Present Illness 78 year old female presents with fatigue, dyspnea on exertion and shortness of breath and admitted for Acute GI bleed,  Acute Blood loss anemia,  Hemorrhagic shock, plus or minus component of septic shock due to UTI.  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  Pt currently with functional limitations due to the deficits listed below (see PT Problem List). Pt in bed when therapist arrived. Waist restraint in place. Pt required increased time, cues, use of hospital bed and CGA for supine to sit, pt reported need to void, pt required mod A x 2 to sit to stand  to RW, pt perseverating on hight of RW and will benefit from youth hight with future interventions, therapist elected to assist pt with HHA/mod x 2 for SPT bed to [facility], pt appeared to be fearful of falling and requested A x2 for subsequent sit to stand  from [facility] with pt requiring A for hygiene tasks and mod A to maintain balance. Pt seated in recliner with switch transfer vs SPT. Pt left seated in recliner, all needs in place and nursing staff aware of no waist restraint in place with pt seated on chair alarm and A x 2 for transfer tasks. Patient will benefit from continued inpatient follow up therapy, <3 hours/day.  Pt will benefit from acute skilled PT to increase their independence and safety with mobility to allow discharge.      If plan is discharge home, recommend the following: A lot of help with walking and/or transfers;A lot of help with bathing/dressing/bathroom;Help with stairs or ramp for entrance;Assistance with cooking/housework;Supervision due to cognitive status   Can travel by private vehicle     No   Equipment Recommendations  Rolling walker (2 wheels) (TBD)    Recommendations for Other Services       Precautions / Restrictions Precautions Precautions: Fall Precaution/Restrictions Comments: waist restraint mittens not donned Restrictions Weight Bearing Restrictions Per Provider Order: No     Mobility  Bed Mobility Overal bed mobility: Needs Assistance Bed Mobility: Supine to Sit     Supine to sit: Contact guard, HOB elevated     General bed mobility comments: no physical assist required. Required cues with increased time and effort required for initiation, task continuation and completion    Transfers Overall transfer level: Needs assistance Equipment used: Rolling walker (2 wheels), 2 person hand held assist Transfers: Sit to/from Stand, Bed to chair/wheelchair/BSC Sit to Stand: Mod assist, +2 physical assistance, +2 safety/equipment Stand pivot transfers: Mod assist, +2 physical assistance, +2 safety/equipment         General transfer comment: sit - stand from bed to RW mod A +2, pt kept stating, "this isn't gonna work" and wanted to sit back on EOB. pt perceverating on height of RW and will benefit from youth RW due to RW being in the lowest position at time of intervention,  Sit - stand/SPT to [facility] 2 person HHA mod A +2, sit - stand from [facility] mod-min A x 2 and switch transfer with BSC removed and recliner placed behind pt with pt rquireing mod A to maintain balance with flexed posture. Pt fearful of falling and wants 2 people to assist her and nursing made aware  Ambulation/Gait               General Gait Details: NT for pt and staff safety   Stairs             Wheelchair Mobility     Tilt Bed    Modified Rankin (Stroke Patients Only)       Balance Overall balance assessment: Needs assistance (pt poor historian and unable to communicate fall hx) Sitting-balance support: Feet supported Sitting balance-Leahy Scale: Fair     Standing  balance support: Bilateral upper extremity supported, During functional activity, Reliant on assistive device for balance Standing balance-Leahy Scale: Poor                              Communication Communication Communication: Impaired  Cognition Arousal: Alert Behavior During Therapy: Restless, Agitated   PT - Cognitive impairments: Difficult to assess, Attention, Sequencing (redirection) Difficult to assess due to: Impaired communication                     PT - Cognition Comments: pt became agitated with questions concerning PLOF and stated I will just say "yes" and do not ask me any questions while I am trying to walk because I am focused on safety. pt stated she has 4 people whom help her but then stated she was IND Following commands: Impaired Following commands impaired: Follows one step commands inconsistently    Cueing Cueing Techniques: Verbal cues, Tactile cues  Exercises      General Comments        Pertinent Vitals/Pain Pain Assessment Pain Assessment: No/denies pain Faces Pain Scale: No hurt Breathing: normal Negative Vocalization: none Facial Expression: smiling or inexpressive Body Language: relaxed Consolability: no need to console PAINAD Score: 0    Home Living Family/patient expects to be discharged to:: Private residence Living Arrangements: Alone Available Help at Discharge: Family;Available PRN/intermittently;Neighbor Type of Home: House Home Access: Stairs to enter   Secretary/administrator of Steps: 1   Home Layout: One level Home Equipment: Cane - single point Additional Comments: above info per recent admission 2 months ago    Prior Function            PT Goals (current goals can now be found in the care plan section) Acute Rehab PT Goals PT Goal Formulation: Patient unable to participate in goal setting Time For Goal Achievement: 04/19/23 Potential to Achieve Goals: Fair    Frequency    Min 1X/week       PT Plan      Co-evaluation PT/OT/SLP Co-Evaluation/Treatment: Yes Reason for Co-Treatment: For patient/therapist safety;Necessary to address cognition/behavior during functional activity;To address functional/ADL transfers PT goals addressed during session: Mobility/safety with mobility;Balance;Proper use of DME OT goals addressed during session: ADL's and self-care;Proper use of Adaptive equipment and DME      AM-PAC PT "6 Clicks" Mobility   Outcome Measure  Help needed turning from your back to your side while in a flat bed without using bedrails?: A Little Help needed moving from lying on your back to sitting on the side of a flat bed without using bedrails?: A Little Help needed moving to and from a bed to a chair (including a wheelchair)?: A Lot Help needed standing up from a chair using your arms (e.g., wheelchair or bedside chair)?: A Lot Help needed to walk in hospital room?: Total Help needed climbing 3-5 steps with a railing? : Total 6  Click Score: 12    End of Session Equipment Utilized During Treatment: Gait belt Activity Tolerance: Patient limited by fatigue Patient left: in chair;with chair alarm set;with call bell/phone within reach Nurse Communication: Mobility status PT Visit Diagnosis: Difficulty in walking, not elsewhere classified (R26.2)     Time: 1133-1208 PT Time Calculation (min) (ACUTE ONLY): 35 min  Charges:    $Neuromuscular Re-education: 8-22 mins PT General Charges $$ ACUTE PT VISIT: 1 Visit                     Johnny Bridge, PT Acute Rehab    Jacqualyn Posey 04/07/2023, 5:20 PM

## 2023-04-07 NOTE — Plan of Care (Signed)
  Problem: Clinical Measurements: Goal: Ability to maintain clinical measurements within normal limits will improve Outcome: Progressing Goal: Will remain free from infection Outcome: Progressing Goal: Diagnostic test results will improve Outcome: Progressing Goal: Respiratory complications will improve Outcome: Progressing Goal: Cardiovascular complication will be avoided Outcome: Progressing   Problem: Activity: Goal: Risk for activity intolerance will decrease Outcome: Progressing   Problem: Nutrition: Goal: Adequate nutrition will be maintained Outcome: Progressing   Problem: Coping: Goal: Level of anxiety will decrease Outcome: Progressing   Problem: Elimination: Goal: Will not experience complications related to bowel motility Outcome: Progressing Goal: Will not experience complications related to urinary retention Outcome: Progressing   Problem: Pain Managment: Goal: General experience of comfort will improve and/or be controlled Outcome: Progressing   Problem: Safety: Goal: Ability to remain free from injury will improve Outcome: Progressing   Problem: Skin Integrity: Goal: Risk for impaired skin integrity will decrease Outcome: Progressing   Problem: Education: Goal: Knowledge of General Education information will improve Description: Including pain rating scale, medication(s)/side effects and non-pharmacologic comfort measures Outcome: Not Progressing   Problem: Health Behavior/Discharge Planning: Goal: Ability to manage health-related needs will improve Outcome: Not Progressing

## 2023-04-08 ENCOUNTER — Encounter (HOSPITAL_COMMUNITY): Payer: Self-pay | Admitting: Pulmonary Disease

## 2023-04-08 ENCOUNTER — Other Ambulatory Visit: Payer: Self-pay | Admitting: *Deleted

## 2023-04-08 ENCOUNTER — Encounter: Payer: Self-pay | Admitting: *Deleted

## 2023-04-08 DIAGNOSIS — F05 Delirium due to known physiological condition: Secondary | ICD-10-CM | POA: Diagnosis not present

## 2023-04-08 DIAGNOSIS — E876 Hypokalemia: Secondary | ICD-10-CM

## 2023-04-08 DIAGNOSIS — N390 Urinary tract infection, site not specified: Secondary | ICD-10-CM

## 2023-04-08 DIAGNOSIS — Z8673 Personal history of transient ischemic attack (TIA), and cerebral infarction without residual deficits: Secondary | ICD-10-CM

## 2023-04-08 DIAGNOSIS — E872 Acidosis, unspecified: Secondary | ICD-10-CM

## 2023-04-08 DIAGNOSIS — I10 Essential (primary) hypertension: Secondary | ICD-10-CM

## 2023-04-08 DIAGNOSIS — K529 Noninfective gastroenteritis and colitis, unspecified: Secondary | ICD-10-CM

## 2023-04-08 DIAGNOSIS — K250 Acute gastric ulcer with hemorrhage: Secondary | ICD-10-CM | POA: Diagnosis not present

## 2023-04-08 DIAGNOSIS — N179 Acute kidney failure, unspecified: Secondary | ICD-10-CM

## 2023-04-08 DIAGNOSIS — H5702 Anisocoria: Secondary | ICD-10-CM

## 2023-04-08 DIAGNOSIS — K259 Gastric ulcer, unspecified as acute or chronic, without hemorrhage or perforation: Secondary | ICD-10-CM

## 2023-04-08 DIAGNOSIS — B9620 Unspecified Escherichia coli [E. coli] as the cause of diseases classified elsewhere: Secondary | ICD-10-CM

## 2023-04-08 HISTORY — DX: Hypokalemia: E87.6

## 2023-04-08 LAB — CBC
HCT: 34.6 % — ABNORMAL LOW (ref 36.0–46.0)
Hemoglobin: 10.2 g/dL — ABNORMAL LOW (ref 12.0–15.0)
MCH: 27.8 pg (ref 26.0–34.0)
MCHC: 29.5 g/dL — ABNORMAL LOW (ref 30.0–36.0)
MCV: 94.3 fL (ref 80.0–100.0)
Platelets: 353 10*3/uL (ref 150–400)
RBC: 3.67 MIL/uL — ABNORMAL LOW (ref 3.87–5.11)
RDW: 17.9 % — ABNORMAL HIGH (ref 11.5–15.5)
WBC: 8.3 10*3/uL (ref 4.0–10.5)
nRBC: 0 % (ref 0.0–0.2)

## 2023-04-08 LAB — BASIC METABOLIC PANEL
Anion gap: 7 (ref 5–15)
BUN: <5 mg/dL — ABNORMAL LOW (ref 8–23)
CO2: 13 mmol/L — ABNORMAL LOW (ref 22–32)
Calcium: 7.5 mg/dL — ABNORMAL LOW (ref 8.9–10.3)
Chloride: 116 mmol/L — ABNORMAL HIGH (ref 98–111)
Creatinine, Ser: 0.77 mg/dL (ref 0.44–1.00)
GFR, Estimated: >60 mL/min (ref >60)
Glucose, Bld: 96 mg/dL (ref 70–99)
Potassium: 4.3 mmol/L (ref 3.5–5.1)
Sodium: 136 mmol/L (ref 135–145)

## 2023-04-08 LAB — CULTURE, BLOOD (ROUTINE X 2)
Culture: NO GROWTH
Special Requests: ADEQUATE

## 2023-04-08 MED ORDER — OXYCODONE HCL 5 MG PO TABS
2.5000 mg | ORAL_TABLET | Freq: Once | ORAL | Status: AC | PRN
  Administered 2023-04-08: 2.5 mg via ORAL
  Filled 2023-04-08: qty 1

## 2023-04-08 MED ORDER — POTASSIUM CHLORIDE 20 MEQ PO PACK
20.0000 meq | PACK | Freq: Every day | ORAL | Status: DC
  Administered 2023-04-08 – 2023-04-09 (×2): 20 meq via ORAL
  Filled 2023-04-08 (×2): qty 1

## 2023-04-08 MED ORDER — SODIUM BICARBONATE 650 MG PO TABS
1300.0000 mg | ORAL_TABLET | Freq: Two times a day (BID) | ORAL | Status: DC
  Administered 2023-04-08 – 2023-04-09 (×3): 1300 mg via ORAL
  Filled 2023-04-08 (×3): qty 2

## 2023-04-08 MED ORDER — OXYCODONE HCL 5 MG PO TABS: 2.5000 mg | ORAL_TABLET | Freq: Two times a day (BID) | ORAL | 0 refills | Status: AC | PRN

## 2023-04-08 MED ORDER — ENSURE ENLIVE PO LIQD
237.0000 mL | Freq: Two times a day (BID) | ORAL | Status: DC
Start: 2023-04-08 — End: 2023-04-09
  Administered 2023-04-08 – 2023-04-09 (×3): 237 mL via ORAL

## 2023-04-08 NOTE — Assessment & Plan Note (Signed)
Stable. On Toprol-XL.

## 2023-04-08 NOTE — Assessment & Plan Note (Signed)
GI recommended "Twice daily pump inhibitors long-term since she developed ulcers on once a day and she denies any aspirin and nonsteroidals at home" Will continue protonix 40 mg bid indefinitely.

## 2023-04-08 NOTE — Congregational Nurse Program (Signed)
[id number]/tcf-friend Monica Conway/called stating that patient was very dehydrated and nausea and vomiting present.  Has been like this for several days.  Patient responsive to verbal stimuli.  Does not ewant ems called. Is willing to call primary care on call and discuss with them.   Return phone call form Jamelle Haring.  Patient went to ed at [facility] and did require admission into icu.

## 2023-04-08 NOTE — Progress Notes (Signed)
PROGRESS NOTE    Monica Conway  WUJ:[id number] DOB: 1945/05/08 DOA: 04/03/2023 PCP: [facility]  Subjective: Pt seen and examined. Arousable and alert. Not oriented. On RA.   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  Last dose of eliquis this morning.  Lives alone but has caregiver to help during the day, uses walker/ wheelchair at baseline, recently discharged from rehab.  Denies any dizziness, syncope, HA, URI symptoms, chest/ abd/ or flank pain, N/V, falls, hematemesis or melena.  Currently having some lower abdominal cramping and needs to have a bowel movement.   Found hypotensive, afebrile, NSR, normoxia.  Was initially lethargic but since alert and oriented.  Labs significant for H/H 4.8/ 16.6 (baseline 9-10), K 2.5, bicarb 17, BUN/ sCr 69/ 1.17 and lactic acid 1.7.  FOB +.   Reversed with Theodoro Parma and currently on 2nd of PRBC.  Started on low dose peripheral norepinephrine.  GI has been consulted, CTA GIB pending.  Additionally PPI, calcium, and KCL replete started.  PCCM called for admit.   Significant Events: Admitted 04/03/2023 for acute GI bleeding on Eliquis Care transferred to [facility] on 04-04-2023  Significant Labs: WBC 8.6, HgB 4.8, Plt 253 Na 138, K 2.5, CO2 of 17, BUN 69, Scr 1.17, TP 5.3, Alb 2.6  Significant Imaging Studies: CT angio shows No directly visualized intraluminal contrast enhancement or other findings to specifically localize GI bleeding. 2. Diffuse mucosal hyperenhancement of the sigmoid colon and rectum, consistent with nonspecific infectious or inflammatory proctocolitis. The colon is fluid-filled to the rectum in keeping with diarrheal illness. 3. Diffuse inflammatory fat stranding  about the pancreas, consistent with acute pancreatitis. No pancreatic parenchymal necrosis or acute pancreatic fluid collection. 4. Evidence of partial right hemicolectomy and ileocolic anastomosis. 5. Large hiatal hernia with nearly complete intrathoracic position of the stomach. 6. Cholelithiasis. 7. Subacute, partially callused fractures of the left pubic rami, seen acutely on examination dated 02/02/2023. 8. Coronary artery disease.  Antibiotic Therapy: Anti-infectives (From admission, onward)    Start     Dose/Rate Route Frequency Ordered Stop   04/06/23 1300  metroNIDAZOLE (FLAGYL) IVPB 500 mg        500 mg 100 mL/hr over 60 Minutes Intravenous Every 12 hours 04/06/23 1149 04/10/23 0959   04/06/23 1000  cefTRIAXone (ROCEPHIN) 2 g in sodium chloride 0.9 % 100 mL IVPB        2 g 200 mL/hr over 30 Minutes Intravenous Every 24 hours 04/06/23 0822 04/09/23 0959   04/04/23 1400  cefTRIAXone (ROCEPHIN) 1 g in sodium chloride 0.9 % 100 mL IVPB        1 g 200 mL/hr over 30 Minutes Intravenous Every 24 hours 04/03/23 1808 04/05/23 1554   04/04/23 0900  metroNIDAZOLE (FLAGYL) IVPB 500 mg        500 mg 100 mL/hr over 60 Minutes Intravenous Every 12 hours 04/04/23 0754 04/05/23 2347   04/03/23 2200  ceFEPIme (MAXIPIME) 2 g in sodium chloride 0.9 % 100 mL IVPB  Status:  Discontinued        2 g 200 mL/hr over 30 Minutes Intravenous Every 12 hours 04/03/23 1521 04/03/23 1525   04/03/23 1700  ceFEPIme (MAXIPIME) 2 g in sodium chloride 0.9 %  100 mL IVPB  Status:  Discontinued        2 g 200 mL/hr over 30 Minutes Intravenous Every 12 hours 04/03/23 1525 04/03/23 1808   04/03/23 1630  vancomycin (VANCOCIN) IVPB 1000 mg/200 mL premix  Status:  Discontinued        1,000 mg 200 mL/hr over 60 Minutes Intravenous Every 48 hours 04/03/23 1521 04/03/23 1808   04/03/23 1300  cefTRIAXone (ROCEPHIN) 1 g in sodium chloride 0.9 % 100 mL IVPB        1 g 200 mL/hr over 30 Minutes Intravenous  Once 04/03/23 1247  04/03/23 1442       Procedures: 04-04-2023 EGD showed - Normal larynx. - Medium-sized hiatal hernia.     - Erosive gastritis. - Non-bleeding gastric ulcer with pigmented  material. - Non-bleeding duodenal ulcer with no stigmata of bleeding.- Normal second portion of the duodenum, third       portion of the duodenum and fourth portion of the duodenum.  Consultants: GI    Assessment and Plan: * Acute GI bleeding From Admission until 04-07-2023.  Status post reversal Eliquis with Kcentra. -S/P 3 units of packed red blood cell-. CTA Abdomen: proctocolitis. No directly visualized intraluminal contrast enhancement or other findings to specifically localize GI bleeding. Diffuse inflammatory fat stranding about the pancreas, consistent with acute pancreatitis. -S/P endoscopy 2/07: Erosive Gastritis,  Non-bleeding gastric ulcer with pigmented material. Non-bleeding duodenal ulcer with no stigmata of  bleeding. -Iron levels low. She will need IV iron, her IV got infiltrated today, would do IV iron tomorrow. . Hb stable. . Advanced diet to Soft today  04-08-2023 HgB stable at 10.2. start nu-iron 150 mg daily at SNF  Gastric ulcer GI recommended "Twice daily pump inhibitors long-term since she developed ulcers on once a day and she denies any aspirin and nonsteroidals at home" Will continue protonix 40 mg bid indefinitely.  Delirium due to multiple etiologies, acute, hyperactive From Admission until 04-07-2023. Pt had hospital acquired delirium. Pt treated with seroquel. Will continue seroquel at SNF.  Proctocolitis Continue with flagyl for 4 more days. WBC is normal at 8.3  Metabolic acidosis Pt with metabolic acidosis. Had this when she was admitted to hospital in 01-2023. Pt is not dehydrated. BUN <5 and Scr .077.  Will start bicarb 650 mg bid. Repeat outpatient BMET in 1 week.  Essential hypertension On admission. Pt was hypotensive due to blood loss and required IV vasopressor for hypotension  and PRBC transfusion  Since then, pt's BP has been stable. Remains on norvasc and Toprol-XL.  Anisocoria - chronic. Chronic.  Hypokalemia 04-08-2023 resolved. Repleted. K 4.3 today.  AKI (acute kidney injury) (HCC) Due to hypotension from acute blood loss. Treated with PRBC transfusion and IVF. AKI has resolved. Scr 0.77  History of stroke - 01-2023. felt to be due to afib. 04-08-2023 Pt had stroke in 01-2023. Was placed on Eliquis due to new onset afib noted on telemetry. Given recent GI bleeding from gastric ulcer, GI wants her to have repeat EGD in 3 months. Pt has been [place] [facility] in the past. [facility] has deferred further outpatient f/u to her primary GI provider which is [facility]  Pt will need outpatient card appointment in 2-4 weeks to see when Eliquis can be restarted.  Paroxysmal atrial fibrillation (HCC) Stable. On Toprol-XL.  E. coli UTI Treated with 5 days of IV rocephin. Resolved.   DVT prophylaxis: SCDs Start: 04/03/23 1531  SCDs   Code Status: Limited:  Do not attempt resuscitation (DNR) -DNR-LIMITED -Do Not Intubate/DNI  Family Communication: no family at bedside Disposition Plan: SNF Reason for continuing need for hospitalization: stable for DC.   Objective: Vitals:   04/07/23 1246 04/07/23 1954 04/08/23 0307 04/08/23 0316  BP: 127/72 121/63 (!) 125/58   Pulse: 76 74 72   Resp: 16 17 19    Temp: 98.3 F (36.8 C) 98.2 F (36.8 C) 98.3 F (36.8 C)   TempSrc: Oral Oral Oral   SpO2: 100% 96% 95%   Weight:    55.8 kg  Height:        Intake/Output Summary (Last 24 hours) at 04/08/2023 1103 Last data filed at 04/08/2023 0900 Gross per 24 hour  Intake 2595.51 ml  Output 800 ml  Net 1795.51 ml   Filed Weights   04/06/23 0500 04/07/23 0412 04/08/23 0316  Weight: 53 kg 55.1 kg 55.8 kg    Examination:  Physical Exam Vitals and nursing note reviewed.  Constitutional:      General: She is not in acute distress.    Appearance: She is  not toxic-appearing or diaphoretic.  HENT:     Head: Normocephalic and atraumatic.     Nose: Nose normal.  Eyes:     General: No scleral icterus.    Comments: Anisocoria  Cardiovascular:     Rate and Rhythm: Normal rate and regular rhythm.  Pulmonary:     Effort: Pulmonary effort is normal.     Breath sounds: Normal breath sounds.  Abdominal:     General: There is no distension.     Palpations: Abdomen is soft.     Tenderness: There is no abdominal tenderness.  Musculoskeletal:     Right lower leg: No edema.     Left lower leg: No edema.  Skin:    General: Skin is warm and dry.     Capillary Refill: Capillary refill takes less than 2 seconds.  Neurological:     General: No focal deficit present.     Mental Status: She is disoriented.     Data Reviewed: I have personally reviewed following labs and imaging studies  CBC: Recent Labs  Lab 04/03/23 1233 04/03/23 1712 04/04/23 1021 04/04/23 2002 04/05/23 0742 04/06/23 0243 04/07/23 0424 04/08/23 0844  WBC 8.6   < > 6.9  --  7.1 8.6 7.6 8.3  NEUTROABS 6.9  --   --   --   --   --   --   --   HGB 4.8*   < > 9.0* 8.2* 10.1* 8.9* 8.5* 10.2*  HCT 16.6*   < > 27.5* 26.7* 31.1* 28.2* 27.8* 34.6*  MCV 93.8   < > 87.3  --  88.6 89.0 93.0 94.3  PLT 253   < > 181  --  236 234 263 353   < > = values in this interval not displayed.   Basic Metabolic Panel: Recent Labs  Lab 04/03/23 1712 04/04/23 0454 04/05/23 0742 04/06/23 0243 04/07/23 0809 04/08/23 0411  NA 141 139 140 141 140 136  K 3.1* 3.0* 3.8 3.3* 2.9* 4.3  CL 118* 118* 114* 117* 117* 116*  CO2 15* 14* 17* 16* 16* 13*  GLUCOSE 139* 91 88 85 91 96  BUN 62* 47* 18 10 5* <5*  CREATININE 0.99 0.96 0.83 0.87 0.73 0.77  CALCIUM 7.0* 7.3* 8.2* 7.8* 7.4* 7.5*  MG 1.3* 2.6*  --   --   --   --   PHOS  --  2.8  --   --   --   --  GFR: Estimated Creatinine Clearance: 46.6 mL/min (by C-G formula based on SCr of 0.77 mg/dL). Liver Function Tests: Recent Labs  Lab  04/03/23 1233 04/04/23 0454  AST 13*  --   ALT 10  --   ALKPHOS 41  --   BILITOT 0.7  --   PROT 5.3*  --   ALBUMIN 2.6* 2.2*   Recent Labs  Lab 04/03/23 1935  LIPASE 27   Sepsis Labs: Recent Labs  Lab 04/03/23 1352 04/03/23 1712  LATICACIDVEN 1.7 1.1    Recent Results (from the past 240 hours)  Blood culture (routine x 2)     Status: None   Collection Time: 04/03/23  1:40 PM   Specimen: BLOOD LEFT ARM  Result Value Ref Range Status   Specimen Description   Final    BLOOD LEFT ARM Performed at [facility], [address] [place], [place] [address]    Special Requests   Final    BOTTLES DRAWN AEROBIC AND ANAEROBIC Blood Culture results may not be optimal due to an inadequate volume of blood received in culture bottles Performed at [facility], [address] [place], [place] [address]    Culture   Final    NO GROWTH 5 DAYS Performed at [facility], [address], [place], [place] [address]    Report Status 04/08/2023 FINAL  Final  Blood culture (routine x 2)     Status: None   Collection Time: 04/03/23  2:09 PM   Specimen: BLOOD  Result Value Ref Range Status   Specimen Description   Final    BLOOD RIGHT ANTECUBITAL Performed at [facility], [address] [place], [place] [address]    Special Requests   Final    BOTTLES DRAWN AEROBIC AND ANAEROBIC Blood Culture adequate volume Performed at [facility], [address] [place], [place] [address]    Culture   Final    NO GROWTH 5 DAYS Performed at [facility], [address], [place], [place] [address]    Report Status 04/08/2023 FINAL  Final  Urine Culture (for pregnant, neutropenic or urologic patients or patients with an indwelling urinary catheter)     Status: Abnormal   Collection Time: 04/03/23  2:58 PM   Specimen: Urine, Clean Catch  Result Value Ref Range Status   Specimen Description   Final    URINE, CLEAN  CATCH Performed at [facility], [address] [place], [place] [address]    Special Requests   Final    NONE Performed at [facility], [address] [place], [place] [address]    Culture >=100,000 COLONIES/mL ESCHERICHIA COLI (A)  Final   Report Status 04/05/2023 FINAL  Final   Organism ID, Bacteria ESCHERICHIA COLI (A)  Final      Susceptibility   Escherichia coli - MIC*    AMPICILLIN <=2 SENSITIVE Sensitive     CEFAZOLIN <=4 SENSITIVE Sensitive     CEFEPIME <=0.12 SENSITIVE Sensitive     CEFTRIAXONE <=0.25 SENSITIVE Sensitive     CIPROFLOXACIN <=0.25 SENSITIVE Sensitive     GENTAMICIN <=1 SENSITIVE Sensitive     IMIPENEM <=0.25 SENSITIVE Sensitive     NITROFURANTOIN <=16 SENSITIVE Sensitive     TRIMETH/SULFA <=20 SENSITIVE Sensitive     AMPICILLIN/SULBACTAM <=2 SENSITIVE Sensitive     PIP/TAZO <=4 SENSITIVE Sensitive ug/mL    * >=100,000 COLONIES/mL ESCHERICHIA COLI  MRSA Next Gen by  PCR, Nasal     Status: None   Collection Time: 04/03/23  5:28 PM   Specimen: Nasal Mucosa; Nasal Swab  Result Value Ref Range Status   MRSA by PCR Next Gen NOT DETECTED NOT DETECTED Final    Comment: (NOTE) The GeneXpert MRSA Assay (FDA approved for NASAL specimens only), is one component of a comprehensive MRSA colonization surveillance program. It is not intended to diagnose MRSA infection nor to guide or monitor treatment for MRSA infections. Test performance is not FDA approved in patients less than 39 years old. Performed at [facility], [address] [place], [place] [address]   Gastrointestinal Panel by PCR , Stool     Status: None   Collection Time: 04/06/23 11:44 AM   Specimen: Stool  Result Value Ref Range Status   Campylobacter species NOT DETECTED NOT DETECTED Final   Plesimonas shigelloides NOT DETECTED NOT DETECTED Final   Salmonella species NOT DETECTED NOT DETECTED Final   Yersinia enterocolitica NOT DETECTED  NOT DETECTED Final   Vibrio species NOT DETECTED NOT DETECTED Final   Vibrio cholerae NOT DETECTED NOT DETECTED Final   Enteroaggregative E coli (EAEC) NOT DETECTED NOT DETECTED Final   Enteropathogenic E coli (EPEC) NOT DETECTED NOT DETECTED Final   Enterotoxigenic E coli (ETEC) NOT DETECTED NOT DETECTED Final   Shiga like toxin producing E coli (STEC) NOT DETECTED NOT DETECTED Final   Shigella/Enteroinvasive E coli (EIEC) NOT DETECTED NOT DETECTED Final   Cryptosporidium NOT DETECTED NOT DETECTED Final   Cyclospora cayetanensis NOT DETECTED NOT DETECTED Final   Entamoeba histolytica NOT DETECTED NOT DETECTED Final   Giardia lamblia NOT DETECTED NOT DETECTED Final   Adenovirus F40/41 NOT DETECTED NOT DETECTED Final   Astrovirus NOT DETECTED NOT DETECTED Final   Norovirus GI/GII NOT DETECTED NOT DETECTED Final   Rotavirus A NOT DETECTED NOT DETECTED Final   Sapovirus (I, II, IV, and V) NOT DETECTED NOT DETECTED Final    Comment: Performed at [facility], [address], [place], [place] [address]     Radiology Studies: No results found.  Scheduled Meds:  amLODipine  5 mg Oral Daily   Chlorhexidine Gluconate Cloth  6 each Topical Daily   feeding supplement  237 mL Oral BID BM   metoprolol succinate  50 mg Oral Daily   pantoprazole  40 mg Oral BID   potassium chloride  20 mEq Oral Daily   QUEtiapine  25 mg Oral BID   sodium bicarbonate  1,300 mg Oral BID   Continuous Infusions:  metronidazole 500 mg (04/07/23 2115)     LOS: 5 days   Time spent: 40 minutes  Carollee Herter, DO  [facility]  04/08/2023, 11:03 AM

## 2023-04-08 NOTE — Plan of Care (Signed)
  Problem: Clinical Measurements: Goal: Ability to maintain clinical measurements within normal limits will improve Outcome: Progressing Goal: Will remain free from infection Outcome: Progressing Goal: Diagnostic test results will improve Outcome: Progressing Goal: Respiratory complications will improve Outcome: Progressing Goal: Cardiovascular complication will be avoided Outcome: Progressing   Problem: Nutrition: Goal: Adequate nutrition will be maintained Outcome: Progressing   Problem: Coping: Goal: Level of anxiety will decrease Outcome: Progressing   Problem: Elimination: Goal: Will not experience complications related to bowel motility Outcome: Progressing Goal: Will not experience complications related to urinary retention Outcome: Progressing   Problem: Pain Managment: Goal: General experience of comfort will improve and/or be controlled Outcome: Progressing   Problem: Safety: Goal: Ability to remain free from injury will improve Outcome: Progressing   Problem: Skin Integrity: Goal: Risk for impaired skin integrity will decrease Outcome: Progressing   Filiberto Pinks, RN

## 2023-04-08 NOTE — Assessment & Plan Note (Signed)
Chronic.

## 2023-04-08 NOTE — Assessment & Plan Note (Signed)
04-08-2023 Pt had stroke in 01-2023. Was placed on Eliquis due to new onset afib noted on telemetry. Given recent GI bleeding from gastric ulcer, GI wants her to have repeat EGD in 3 months. Pt has been [place] [facility] in the past. [facility] has deferred further outpatient f/u to her primary GI provider which is [facility].  Pt will need outpatient card appointment in 2-4 weeks to see when Eliquis can be restarted.

## 2023-04-08 NOTE — Assessment & Plan Note (Signed)
Pt with metabolic acidosis. Had this when she was admitted to hospital in 01-2023. Pt is not dehydrated. BUN <5 and Scr .077.  Will start bicarb 650 mg bid. Repeat outpatient BMET in 1 week.

## 2023-04-08 NOTE — Assessment & Plan Note (Signed)
04-08-2023 resolved. Repleted. K 4.3 today.

## 2023-04-08 NOTE — Assessment & Plan Note (Signed)
From Admission until 04-07-2023.  Status post reversal Eliquis with Kcentra. -S/P 3 units of packed red blood cell-. CTA Abdomen: proctocolitis. No directly visualized intraluminal contrast enhancement or other findings to specifically localize GI bleeding. Diffuse inflammatory fat stranding about the pancreas, consistent with acute pancreatitis. -S/P endoscopy 2/07: Erosive Gastritis,  Non-bleeding gastric ulcer with pigmented material. Non-bleeding duodenal ulcer with no stigmata of  bleeding. -Iron levels low. She will need IV iron, her IV got infiltrated today, would do IV iron tomorrow. . Hb stable. . Advanced diet to Soft today  04-08-2023 HgB stable at 10.2. start nu-iron 150 mg daily at [facility]  04-09-2023 stable for DC to SNF today.

## 2023-04-08 NOTE — Hospital Course (Addendum)
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  Last dose of eliquis this morning.  Lives alone but has caregiver to help during the day, uses walker/ wheelchair at baseline, recently discharged from rehab.  Denies any dizziness, syncope, HA, URI symptoms, chest/ abd/ or flank pain, N/V, falls, hematemesis or melena.  Currently having some lower abdominal cramping and needs to have a bowel movement.   Found hypotensive, afebrile, NSR, normoxia.  Was initially lethargic but since alert and oriented.  Labs significant for H/H 4.8/ 16.6 (baseline 9-10), K 2.5, bicarb 17, BUN/ sCr 69/ 1.17 and lactic acid 1.7.  FOB +.   Reversed with Theodoro Parma and currently on 2nd of PRBC.  Started on low dose peripheral norepinephrine.  GI has been consulted, CTA GIB pending.  Additionally PPI, calcium, and KCL replete started.  PCCM called for admit.   Significant Events: Admitted 04/03/2023 for acute GI bleeding on Eliquis Care transferred to [facility] on 04-04-2023  Significant Labs: WBC 8.6, HgB 4.8, Plt 253 Na 138, K 2.5, CO2 of 17, BUN 69, Scr 1.17, TP 5.3, Alb 2.6  Significant Imaging Studies: CT angio shows No directly visualized intraluminal contrast enhancement or other findings to specifically localize GI bleeding. 2. Diffuse mucosal hyperenhancement of the sigmoid colon and rectum, consistent with nonspecific infectious or inflammatory proctocolitis. The colon is fluid-filled to the rectum in keeping with diarrheal illness. 3. Diffuse inflammatory fat stranding about the pancreas, consistent with acute pancreatitis. No pancreatic parenchymal necrosis or acute pancreatic fluid collection. 4. Evidence of partial right hemicolectomy and ileocolic anastomosis. 5. Large  hiatal hernia with nearly complete intrathoracic position of the stomach. 6. Cholelithiasis. 7. Subacute, partially callused fractures of the left pubic rami, seen acutely on examination dated 02/02/2023. 8. Coronary artery disease.  Antibiotic Therapy: Anti-infectives (From admission, onward)    Start     Dose/Rate Route Frequency Ordered Stop   04/06/23 1300  metroNIDAZOLE (FLAGYL) IVPB 500 mg        500 mg 100 mL/hr over 60 Minutes Intravenous Every 12 hours 04/06/23 1149 04/10/23 0959   04/06/23 1000  cefTRIAXone (ROCEPHIN) 2 g in sodium chloride 0.9 % 100 mL IVPB        2 g 200 mL/hr over 30 Minutes Intravenous Every 24 hours 04/06/23 0822 04/09/23 0959   04/04/23 1400  cefTRIAXone (ROCEPHIN) 1 g in sodium chloride 0.9 % 100 mL IVPB        1 g 200 mL/hr over 30 Minutes Intravenous Every 24 hours 04/03/23 1808 04/05/23 1554   04/04/23 0900  metroNIDAZOLE (FLAGYL) IVPB 500 mg        500 mg 100 mL/hr over 60 Minutes Intravenous Every 12 hours 04/04/23 0754 04/05/23 2347   04/03/23 2200  ceFEPIme (MAXIPIME) 2 g in sodium chloride 0.9 % 100 mL IVPB  Status:  Discontinued        2 g 200 mL/hr over 30 Minutes Intravenous Every 12 hours 04/03/23 1521 04/03/23 1525   04/03/23 1700  ceFEPIme (MAXIPIME) 2 g in sodium chloride 0.9 % 100 mL IVPB  Status:  Discontinued        2 g 200 mL/hr over 30 Minutes Intravenous Every 12 hours 04/03/23 1525 04/03/23 1808   04/03/23 1630  vancomycin (VANCOCIN) IVPB 1000 mg/200 mL premix  Status:  Discontinued        1,000 mg 200 mL/hr over 60 Minutes Intravenous Every 48 hours 04/03/23 1521 04/03/23 1808   04/03/23 1300  cefTRIAXone (ROCEPHIN) 1 g in sodium chloride 0.9 % 100 mL IVPB        1 g 200 mL/hr over 30 Minutes Intravenous  Once 04/03/23 1247 04/03/23 1442       Procedures: 04-04-2023 EGD showed - Normal larynx. - Medium-sized hiatal hernia.     - Erosive gastritis. - Non-bleeding gastric ulcer with pigmented  material. - Non-bleeding duodenal  ulcer with no stigmata of bleeding.- Normal second portion of the duodenum, third       portion of the duodenum and fourth portion of the duodenum.  Consultants: GI

## 2023-04-08 NOTE — Assessment & Plan Note (Signed)
Due to hypotension from acute blood loss. Treated with PRBC transfusion and IVF. AKI has resolved. Scr 0.77

## 2023-04-08 NOTE — Assessment & Plan Note (Signed)
From Admission until 04-07-2023. Pt had hospital acquired delirium. Pt treated with seroquel. Will continue seroquel at SNF.

## 2023-04-08 NOTE — Assessment & Plan Note (Signed)
On admission. Pt was hypotensive due to blood loss and required IV vasopressor for hypotension and PRBC transfusion Since then, pt's BP has been stable. Remains on norvasc 5 mg every day and Toprol-XL 50 mg qday

## 2023-04-08 NOTE — Assessment & Plan Note (Signed)
Continue with flagyl for 4 more days. WBC is normal at 8.3

## 2023-04-08 NOTE — TOC Progression Note (Addendum)
Transition of Care [facility]) - Progression Note   Patient Details  Name: Monica Conway MRN: [id number] Date of Birth: [birth year]  Transition of Care [facility]) CM/SW Contact  Ewing Schlein, LCSW Phone Number: 04/08/2023, 11:21 AM  Clinical Narrative: CSW spoke with patient's brother, Monica Conway, regarding SNF bed offers. Brother asked if [facility] was on the list of offers and, if so, that would be his first choice. CSW confirmed [facility] made a bed offer. CSW followed up with Monica Conway in admissions at [facility] and confirmed a bed is available today if insurance authorization comes through today. Hospitalist notified CSW that patient is medically ready today. CSW started insurance authorization on [employer] portal, which is pending. Reference ID # is: [id number]. TOC awaiting insurance authorization.  Addendum: CSW met with patient to answer questions about SNF and confirm she is being referred for rehab and not LTC. Patient is agreeable to going to [facility] as she reported she had a good experience there in the past for rehab. CSW confirmed with Monica Conway in admissions it will be a private room. CSW updated brother.  Expected Discharge Plan: Skilled Nursing Facility Barriers to Discharge: Insurance Authorization  Expected Discharge Plan and Services In-house Referral: Clinical Social Work Discharge Planning Services: NA Post Acute Care Choice:  (TBD) Living arrangements for the past 2 months: Single Family Home              DME Arranged: N/A DME Agency: NA  Social Determinants of Health (SDOH) Interventions SDOH Screenings   Food Insecurity: Patient Unable To Answer (04/05/2023)  Housing: Patient Unable To Answer (04/05/2023)  Transportation Needs: Patient Unable To Answer (04/05/2023)  Utilities: Patient Unable To Answer (04/05/2023)  Social Connections: Unknown (04/05/2023)  Tobacco Use: Low Risk  (04/04/2023)   Readmission Risk Interventions    04/06/2023    8:44 AM  Readmission Risk  Prevention Plan  Transportation Screening Complete  PCP or Specialist Appt within 3-5 Days Complete  HRI or Home Care Consult Complete  Social Work Consult for Recovery Care Planning/Counseling Complete  Palliative Care Screening Not Applicable  Medication Review Oceanographer) Complete

## 2023-04-08 NOTE — Subjective & Objective (Signed)
Pt seen and examined. Arousable and alert. Not oriented. On RA. Eating breakfast. DC to SNF today

## 2023-04-08 NOTE — Assessment & Plan Note (Signed)
Treated with 5 days of IV rocephin. Resolved.

## 2023-04-09 DIAGNOSIS — E872 Acidosis, unspecified: Secondary | ICD-10-CM | POA: Diagnosis not present

## 2023-04-09 DIAGNOSIS — I10 Essential (primary) hypertension: Secondary | ICD-10-CM | POA: Diagnosis not present

## 2023-04-09 DIAGNOSIS — K250 Acute gastric ulcer with hemorrhage: Secondary | ICD-10-CM | POA: Diagnosis not present

## 2023-04-09 DIAGNOSIS — F05 Delirium due to known physiological condition: Secondary | ICD-10-CM | POA: Diagnosis not present

## 2023-04-09 MED ORDER — SODIUM BICARBONATE 650 MG PO TABS: 650.0000 mg | ORAL_TABLET | Freq: Two times a day (BID) | ORAL | Status: AC

## 2023-04-09 MED ORDER — METOPROLOL SUCCINATE ER 50 MG PO TB24: 50.0000 mg | ORAL_TABLET | Freq: Every day | ORAL | Status: DC

## 2023-04-09 MED ORDER — QUETIAPINE FUMARATE 25 MG PO TABS: 25.0000 mg | ORAL_TABLET | Freq: Two times a day (BID) | ORAL | Status: DC

## 2023-04-09 MED ORDER — METRONIDAZOLE 500 MG PO TABS
500.0000 mg | ORAL_TABLET | Freq: Two times a day (BID) | ORAL | Status: AC
Start: 2023-04-09 — End: 2023-04-13

## 2023-04-09 MED ORDER — POTASSIUM CHLORIDE 20 MEQ PO PACK
20.0000 meq | PACK | Freq: Every day | ORAL | Status: DC
Start: 2023-04-09 — End: 2023-10-27

## 2023-04-09 MED ORDER — POLYSACCHARIDE IRON COMPLEX 150 MG PO CAPS: 150.0000 mg | ORAL_CAPSULE | Freq: Every day | ORAL | Status: DC

## 2023-04-09 MED ORDER — PANTOPRAZOLE SODIUM 40 MG PO TBEC: 40.0000 mg | DELAYED_RELEASE_TABLET | Freq: Two times a day (BID) | ORAL | Status: AC

## 2023-04-09 NOTE — TOC Transition Note (Signed)
Transition of Care [facility]) - Discharge Note  Patient Details  Name: Monica Conway MRN: [id number] Date of Birth: 06-Aug-1945  Transition of Care [facility]) CM/SW Contact:  Ewing Schlein, LCSW Phone Number: 04/09/2023, 10:25 AM  Clinical Narrative: Patient has received insurance authorization for [name] ID#: [id number], approved for 04/08/2023-04/10/2023). CSW notified patient of discharge today. Patient will go to room 3209 and the number for report is [phone]. [phone]. Discharge summary, discharge orders, and SNF transfer report faxed to facility in hub. Medical necessity form done; PTAR scheduled. Discharge packet completed. CSW left voicemail for brother, Casper Harrison, regarding transportation being set up. RN updated. TOC singing off.  Final next level of care: Skilled Nursing Facility Barriers to Discharge: Barriers Resolved  Patient Goals and CMS Choice Patient states their goals for this hospitalization and ongoing recovery are:: Go home after rehab CMS Medicare.gov Compare Post Acute Care list provided to:: Patient Choice offered to / list presented to : Patient, Sibling  Discharge Placement Existing PASRR number confirmed : 04/06/23          Patient chooses bed at: [facility] Patient to be transferred to facility by: PTAR Name of family member notified: Casper Harrison (brother) Patient and family notified of of transfer: 04/09/23  Discharge Plan and Services Additional resources added to the After Visit Summary for   In-house Referral: Clinical Social Work Discharge Planning Services: NA Post Acute Care Choice:  (TBD)          DME Arranged: N/A DME Agency: NA  Social Drivers of Health (SDOH) Interventions SDOH Screenings   Food Insecurity: Patient Unable To Answer (04/05/2023)  Housing: Patient Unable To Answer (04/05/2023)  Transportation Needs: Patient Unable To Answer (04/05/2023)  Utilities: Patient Unable To Answer (04/05/2023)  Social  Connections: Unknown (04/05/2023)  Tobacco Use: Low Risk  (04/04/2023)   Readmission Risk Interventions    04/06/2023    8:44 AM  Readmission Risk Prevention Plan  Transportation Screening Complete  PCP or Specialist Appt within 3-5 Days Complete  HRI or Home Care Consult Complete  Social Work Consult for Recovery Care Planning/Counseling Complete  Palliative Care Screening Not Applicable  Medication Review Oceanographer) Complete

## 2023-04-09 NOTE — Plan of Care (Signed)

## 2023-04-09 NOTE — Care Management Important Message (Signed)
Important Message  Patient Details IM Letter given. Name: Monica Conway MRN: [id number] Date of Birth: [birth year]   Important Message Given:  Yes - Medicare IM     Caren Macadam 04/09/2023, 10:46 AM

## 2023-04-09 NOTE — Progress Notes (Signed)
Writer made 2nd attempt to contact receiving nurse at [facility]. Attempt unsuccessful.

## 2023-04-09 NOTE — Progress Notes (Addendum)
Writer attempted to contact receiving nurse at [employer]. [employer]. Will re-attempt call in near future.

## 2023-04-09 NOTE — Progress Notes (Signed)
 PROGRESS NOTE    Monica Conway  NGE:[id number] DOB: 27-Oct-1945 DOA: 04/03/2023 PCP: [facility]  Subjective: Pt seen and examined. Arousable and alert. Not oriented. On RA. Eating breakfast. 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  Last dose of eliquis this morning.  Lives alone but has caregiver to help during the day, uses walker/ wheelchair at baseline, recently discharged from rehab.  Denies any dizziness, syncope, HA, URI symptoms, chest/ abd/ or flank pain, N/V, falls, hematemesis or melena.  Currently having some lower abdominal cramping and needs to have a bowel movement.   Found hypotensive, afebrile, NSR, normoxia.  Was initially lethargic but since alert and oriented.  Labs significant for H/H 4.8/ 16.6 (baseline 9-10), K 2.5, bicarb 17, BUN/ sCr 69/ 1.17 and lactic acid 1.7.  FOB +.   Reversed with Theodoro Parma and currently on 2nd of PRBC.  Started on low dose peripheral norepinephrine.  GI has been consulted, CTA GIB pending.  Additionally PPI, calcium, and KCL replete started.  PCCM called for admit.   Significant Events: Admitted 04/03/2023 for acute GI bleeding on Eliquis Care transferred to [facility] on 04-04-2023  Significant Labs: WBC 8.6, HgB 4.8, Plt 253 Na 138, K 2.5, CO2 of 17, BUN 69, Scr 1.17, TP 5.3, Alb 2.6  Significant Imaging Studies: CT angio shows No directly visualized intraluminal contrast enhancement or other findings to specifically localize GI bleeding. 2. Diffuse mucosal hyperenhancement of the sigmoid colon and rectum, consistent with nonspecific infectious or inflammatory proctocolitis. The colon is fluid-filled to the rectum in keeping with diarrheal illness. 3.  Diffuse inflammatory fat stranding about the pancreas, consistent with acute pancreatitis. No pancreatic parenchymal necrosis or acute pancreatic fluid collection. 4. Evidence of partial right hemicolectomy and ileocolic anastomosis. 5. Large hiatal hernia with nearly complete intrathoracic position of the stomach. 6. Cholelithiasis. 7. Subacute, partially callused fractures of the left pubic rami, seen acutely on examination dated 02/02/2023. 8. Coronary artery disease.  Antibiotic Therapy: Anti-infectives (From admission, onward)    Start     Dose/Rate Route Frequency Ordered Stop   04/06/23 1300  metroNIDAZOLE (FLAGYL) IVPB 500 mg        500 mg 100 mL/hr over 60 Minutes Intravenous Every 12 hours 04/06/23 1149 04/10/23 0959   04/06/23 1000  cefTRIAXone (ROCEPHIN) 2 g in sodium chloride 0.9 % 100 mL IVPB        2 g 200 mL/hr over 30 Minutes Intravenous Every 24 hours 04/06/23 0822 04/09/23 0959   04/04/23 1400  cefTRIAXone (ROCEPHIN) 1 g in sodium chloride 0.9 % 100 mL IVPB        1 g 200 mL/hr over 30 Minutes Intravenous Every 24 hours 04/03/23 1808 04/05/23 1554   04/04/23 0900  metroNIDAZOLE (FLAGYL) IVPB 500 mg        500 mg 100 mL/hr over 60 Minutes Intravenous Every 12 hours 04/04/23 0754 04/05/23 2347   04/03/23 2200  ceFEPIme (MAXIPIME) 2 g in sodium chloride 0.9 % 100 mL IVPB  Status:  Discontinued        2 g 200 mL/hr over 30 Minutes Intravenous Every 12 hours 04/03/23 1521 04/03/23 1525   04/03/23 1700  ceFEPIme (MAXIPIME)  2 g in sodium chloride 0.9 % 100 mL IVPB  Status:  Discontinued        2 g 200 mL/hr over 30 Minutes Intravenous Every 12 hours 04/03/23 1525 04/03/23 1808   04/03/23 1630  vancomycin (VANCOCIN) IVPB 1000 mg/200 mL premix  Status:  Discontinued        1,000 mg 200 mL/hr over 60 Minutes Intravenous Every 48 hours 04/03/23 1521 04/03/23 1808   04/03/23 1300  cefTRIAXone (ROCEPHIN) 1 g in sodium chloride 0.9 % 100 mL IVPB        1 g 200 mL/hr over 30 Minutes  Intravenous  Once 04/03/23 1247 04/03/23 1442       Procedures: 04-04-2023 EGD showed - Normal larynx. - Medium-sized hiatal hernia.     - Erosive gastritis. - Non-bleeding gastric ulcer with pigmented  material. - Non-bleeding duodenal ulcer with no stigmata of bleeding.- Normal second portion of the duodenum, third       portion of the duodenum and fourth portion of the duodenum.  Consultants: GI    Assessment and Plan: * Acute GI bleeding From Admission until 04-07-2023.  Status post reversal Eliquis with Kcentra. -S/P 3 units of packed red blood cell-. CTA Abdomen: proctocolitis. No directly visualized intraluminal contrast enhancement or other findings to specifically localize GI bleeding. Diffuse inflammatory fat stranding about the pancreas, consistent with acute pancreatitis. -S/P endoscopy 2/07: Erosive Gastritis,  Non-bleeding gastric ulcer with pigmented material. Non-bleeding duodenal ulcer with no stigmata of  bleeding. -Iron levels low. She will need IV iron, her IV got infiltrated today, would do IV iron tomorrow. . Hb stable. . Advanced diet to Soft today  04-08-2023 HgB stable at 10.2. start nu-iron 150 [facility] daily at [facility]  04-09-2023 stable for DC to SNF today.  Gastric ulcer 04-09-2023 GI recommended "Twice daily pump inhibitors long-term since she developed ulcers on once a day and she denies any aspirin and nonsteroidals at home". Will continue protonix 40 mg bid indefinitely.  Delirium due to multiple etiologies, acute, hyperactive From Admission until 04-07-2023. Pt had hospital acquired delirium. Pt treated with seroquel. Will continue seroquel at SNF.  Proctocolitis Continue with flagyl for 4 more days. WBC is normal at 8.3  Metabolic acidosis Pt with metabolic acidosis. Had this when she was admitted to hospital in 01-2023. Pt is not dehydrated. BUN <5 and Scr .077.  Will start bicarb 650 mg bid. Repeat outpatient BMET in 1 week.  Essential hypertension On  admission. Pt was hypotensive due to blood loss and required IV vasopressor for hypotension and PRBC transfusion Since then, pt's BP has been stable. Remains on norvasc 5 mg every day and Toprol-XL 50 mg qday   Anisocoria - chronic. Chronic.  Hypokalemia 04-08-2023 resolved. Repleted. K 4.3 today.  AKI (acute kidney injury) (HCC) Due to hypotension from acute blood loss. Treated with PRBC transfusion and IVF. AKI has resolved. Scr 0.77  History of stroke - 01-2023. felt to be due to afib. 04-08-2023 Pt had stroke in 01-2023. Was placed on Eliquis due to new onset afib noted on telemetry. Given recent GI bleeding from gastric ulcer, GI wants her to have repeat EGD in 3 months. Pt has been [place] clinic in the past. [facility] has deferred further outpatient f/u to her primary GI provider which is [facility].  Pt will need outpatient card appointment in 2-4 weeks to see when Eliquis can be restarted.  Paroxysmal atrial fibrillation (HCC) Stable. On Toprol-XL.  E. coli UTI  Treated with 5 days of IV rocephin. Resolved.       DVT prophylaxis: SCDs Start: 04/03/23 1531    Code Status: Limited: Do not attempt resuscitation (DNR) -DNR-LIMITED -Do Not Intubate/DNI  Family Communication: no family at bedside Disposition Plan: SNF placement Reason for continuing need for hospitalization: stable for DC today.  Objective: Vitals:   04/08/23 2048 04/09/23 0314 04/09/23 0500 04/09/23 0811  BP: 108/66 123/68  121/72  Pulse: 88 77  70  Resp: (!) 22 17    Temp: 99.1 F (37.3 C) 98.1 F (36.7 C)  98.4 F (36.9 C)  TempSrc: Oral Oral  Oral  SpO2: 97% 96%  97%  Weight:   54.6 kg   Height:        Intake/Output Summary (Last 24 hours) at 04/09/2023 0911 Last data filed at 04/09/2023 0811 Gross per 24 hour  Intake 360 ml  Output 1451 ml  Net -1091 ml   Filed Weights   04/07/23 0412 04/08/23 0316 04/09/23 0500  Weight: 55.1 kg 55.8 kg 54.6 kg    Examination:  Physical  Exam Vitals and nursing note reviewed.  Constitutional:      General: She is not in acute distress.    Appearance: She is not diaphoretic.  HENT:     Head: Normocephalic and atraumatic.  Eyes:     General: No scleral icterus. Cardiovascular:     Rate and Rhythm: Normal rate and regular rhythm.     Heart sounds: Murmur heard.  Pulmonary:     Effort: Pulmonary effort is normal. No respiratory distress.     Breath sounds: No wheezing.  Abdominal:     General: Abdomen is flat. Bowel sounds are normal.     Palpations: Abdomen is soft.  Musculoskeletal:     Right lower leg: No edema.     Left lower leg: No edema.  Skin:    General: Skin is warm and dry.     Capillary Refill: Capillary refill takes less than 2 seconds.  Neurological:     Mental Status: She is alert. She is disoriented.     Data Reviewed: I have personally reviewed following labs and imaging studies  CBC: Recent Labs  Lab 04/03/23 1233 04/03/23 1712 04/04/23 1021 04/04/23 2002 04/05/23 0742 04/06/23 0243 04/07/23 0424 04/08/23 0844  WBC 8.6   < > 6.9  --  7.1 8.6 7.6 8.3  NEUTROABS 6.9  --   --   --   --   --   --   --   HGB 4.8*   < > 9.0* 8.2* 10.1* 8.9* 8.5* 10.2*  HCT 16.6*   < > 27.5* 26.7* 31.1* 28.2* 27.8* 34.6*  MCV 93.8   < > 87.3  --  88.6 89.0 93.0 94.3  PLT 253   < > 181  --  236 234 263 353   < > = values in this interval not displayed.   Basic Metabolic Panel: Recent Labs  Lab 04/03/23 1712 04/04/23 0454 04/05/23 0742 04/06/23 0243 04/07/23 0809 04/08/23 0411  NA 141 139 140 141 140 136  K 3.1* 3.0* 3.8 3.3* 2.9* 4.3  CL 118* 118* 114* 117* 117* 116*  CO2 15* 14* 17* 16* 16* 13*  GLUCOSE 139* 91 88 85 91 96  BUN 62* 47* 18 10 5* <5*  CREATININE 0.99 0.96 0.83 0.87 0.73 0.77  CALCIUM 7.0* 7.3* 8.2* 7.8* 7.4* 7.5*  MG 1.3* 2.6*  --   --   --   --  PHOS  --  2.8  --   --   --   --    GFR: Estimated Creatinine Clearance: 46.6 mL/min (by C-G formula based on SCr of 0.77  mg/dL). Liver Function Tests: Recent Labs  Lab 04/03/23 1233 04/04/23 0454  AST 13*  --   ALT 10  --   ALKPHOS 41  --   BILITOT 0.7  --   PROT 5.3*  --   ALBUMIN 2.6* 2.2*   Recent Labs  Lab 04/03/23 1935  LIPASE 27   Sepsis Labs: Recent Labs  Lab 04/03/23 1352 04/03/23 1712  LATICACIDVEN 1.7 1.1    Recent Results (from the past 240 hours)  Blood culture (routine x 2)     Status: None   Collection Time: 04/03/23  1:40 PM   Specimen: BLOOD LEFT ARM  Result Value Ref Range Status   Specimen Description   Final    BLOOD LEFT ARM Performed at [facility], [address], [place], [place] [address]    Special Requests   Final    BOTTLES DRAWN AEROBIC AND ANAEROBIC Blood Culture results may not be optimal due to an inadequate volume of blood received in culture bottles Performed at [facility], [address], [place], [place] [address]    Culture   Final    NO GROWTH 5 DAYS Performed at [facility], [address], [place], [place] [address]    Report Status 04/08/2023 FINAL  Final  Blood culture (routine x 2)     Status: None   Collection Time: 04/03/23  2:09 PM   Specimen: BLOOD  Result Value Ref Range Status   Specimen Description   Final    BLOOD RIGHT ANTECUBITAL Performed at [facility], [address], [place], [place] [address]    Special Requests   Final    BOTTLES DRAWN AEROBIC AND ANAEROBIC Blood Culture adequate volume Performed at [facility], [address] [place], [place] [address]    Culture   Final    NO GROWTH 5 DAYS Performed at [facility], [address] [place], [place] [address]    Report Status 04/08/2023 FINAL  Final  Urine Culture (for pregnant, neutropenic or urologic patients or patients with an indwelling urinary catheter)     Status: Abnormal   Collection Time: 04/03/23  2:58 PM   Specimen: Urine, Clean Catch  Result Value Ref Range Status    Specimen Description   Final    URINE, CLEAN CATCH Performed at [facility], [address], [place], [place] [address]    Special Requests   Final    NONE Performed at [facility], [address], [place], [place] [address]    Culture >=100,000 COLONIES/mL ESCHERICHIA COLI (A)  Final   Report Status 04/05/2023 FINAL  Final   Organism ID, Bacteria ESCHERICHIA COLI (A)  Final      Susceptibility   Escherichia coli - MIC*    AMPICILLIN <=2 SENSITIVE Sensitive     CEFAZOLIN <=4 SENSITIVE Sensitive     CEFEPIME <=0.12 SENSITIVE Sensitive     CEFTRIAXONE <=0.25 SENSITIVE Sensitive     CIPROFLOXACIN <=0.25 SENSITIVE Sensitive     GENTAMICIN <=1 SENSITIVE Sensitive     IMIPENEM <=0.25 SENSITIVE Sensitive     NITROFURANTOIN <=16 SENSITIVE Sensitive     TRIMETH/SULFA <=20 SENSITIVE Sensitive     AMPICILLIN/SULBACTAM <=2 SENSITIVE Sensitive  PIP/TAZO <=4 SENSITIVE Sensitive ug/mL    * >=100,000 COLONIES/mL ESCHERICHIA COLI  MRSA Next Gen by PCR, Nasal     Status: None   Collection Time: 04/03/23  5:28 PM   Specimen: Nasal Mucosa; Nasal Swab  Result Value Ref Range Status   MRSA by PCR Next Gen NOT DETECTED NOT DETECTED Final    Comment: (NOTE) The GeneXpert MRSA Assay (FDA approved for NASAL specimens only), is one component of a comprehensive MRSA colonization surveillance program. It is not intended to diagnose MRSA infection nor to guide or monitor treatment for MRSA infections. Test performance is not FDA approved in patients less than 20 years old. Performed at [facility], [address] [place], [place] [address]   Gastrointestinal Panel by PCR , Stool     Status: None   Collection Time: 04/06/23 11:44 AM   Specimen: Stool  Result Value Ref Range Status   Campylobacter species NOT DETECTED NOT DETECTED Final   Plesimonas shigelloides NOT DETECTED NOT DETECTED Final   Salmonella species NOT DETECTED NOT DETECTED  Final   Yersinia enterocolitica NOT DETECTED NOT DETECTED Final   Vibrio species NOT DETECTED NOT DETECTED Final   Vibrio cholerae NOT DETECTED NOT DETECTED Final   Enteroaggregative E coli (EAEC) NOT DETECTED NOT DETECTED Final   Enteropathogenic E coli (EPEC) NOT DETECTED NOT DETECTED Final   Enterotoxigenic E coli (ETEC) NOT DETECTED NOT DETECTED Final   Shiga like toxin producing E coli (STEC) NOT DETECTED NOT DETECTED Final   Shigella/Enteroinvasive E coli (EIEC) NOT DETECTED NOT DETECTED Final   Cryptosporidium NOT DETECTED NOT DETECTED Final   Cyclospora cayetanensis NOT DETECTED NOT DETECTED Final   Entamoeba histolytica NOT DETECTED NOT DETECTED Final   Giardia lamblia NOT DETECTED NOT DETECTED Final   Adenovirus F40/41 NOT DETECTED NOT DETECTED Final   Astrovirus NOT DETECTED NOT DETECTED Final   Norovirus GI/GII NOT DETECTED NOT DETECTED Final   Rotavirus A NOT DETECTED NOT DETECTED Final   Sapovirus (I, II, IV, and V) NOT DETECTED NOT DETECTED Final    Comment: Performed at [facility], [address], [place], [place] [address]     Radiology Studies: No results found.  Scheduled Meds:  amLODipine  5 mg Oral Daily   Chlorhexidine Gluconate Cloth  6 each Topical Daily   feeding supplement  237 mL Oral BID BM   metoprolol succinate  50 mg Oral Daily   pantoprazole  40 mg Oral BID   potassium chloride  20 mEq Oral Daily   QUEtiapine  25 mg Oral BID   sodium bicarbonate  1,300 mg Oral BID   Continuous Infusions:  metronidazole 500 mg (04/08/23 2242)     LOS: 6 days   Time spent: 40 minutes  Carollee Herter, DO  [facility]  04/09/2023, 9:11 AM

## 2023-04-09 NOTE — Discharge Summary (Addendum)
 [facility] Discharge Summary   Patient name: Monica Conway  Admit date:     04/03/2023  Discharge date: 04/09/2023  Attending Physician: Luciano Cutter [[id number]]  Discharge Physician: Carollee Herter   PCP: [facility]  Admitted From: [facility] Disposition:   [facility]  Recommendations for Outpatient Follow-up:  Follow up with PCP in 4 weeks Cardiology consult in 4 weeks to restart Eliquis Follow up with [facility] in 4 weeks for gastric ulcer. 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  Last dose of eliquis this morning.  Lives alone but has caregiver to help during the day, uses walker/ wheelchair at baseline, recently discharged from rehab.  Denies any dizziness, syncope, HA, URI symptoms, chest/ abd/ or flank pain, N/V, falls, hematemesis or melena.  Currently having some lower abdominal cramping and needs to have a bowel movement.   Found hypotensive, afebrile, NSR, normoxia.  Was initially lethargic but since alert and oriented.  Labs significant for H/H 4.8/ 16.6 (baseline 9-10), K 2.5, bicarb 17, BUN/ sCr 69/ 1.17 and lactic acid 1.7.  FOB +.   Reversed with Theodoro Parma and currently on 2nd of PRBC.  Started on low dose peripheral norepinephrine.  GI has been consulted, CTA GIB pending.  Additionally PPI, calcium, and KCL replete started.  PCCM called for admit.   Significant Events: Admitted 04/03/2023 for acute GI bleeding on  Eliquis Care transferred to [facility] on 04-04-2023  Significant Labs: WBC 8.6, HgB 4.8, Plt 253 Na 138, K 2.5, CO2 of 17, BUN 69, Scr 1.17, TP 5.3, Alb 2.6  Significant Imaging Studies: CT angio shows No directly visualized intraluminal contrast enhancement or other findings to specifically localize GI bleeding. 2. Diffuse mucosal hyperenhancement of the sigmoid colon and rectum, consistent with nonspecific infectious or inflammatory proctocolitis. The colon is fluid-filled to the rectum in keeping with diarrheal illness. 3. Diffuse inflammatory fat stranding about the pancreas, consistent with acute pancreatitis. No pancreatic parenchymal necrosis or acute pancreatic fluid collection. 4. Evidence of partial right hemicolectomy and ileocolic anastomosis. 5. Large hiatal hernia with nearly complete intrathoracic position of the stomach. 6. Cholelithiasis. 7. Subacute, partially callused fractures of the left pubic rami, seen acutely on examination dated 02/02/2023. 8. Coronary artery disease.  Antibiotic Therapy: Anti-infectives (From admission, onward)    Start     Dose/Rate Route Frequency Ordered Stop   04/06/23 1300  metroNIDAZOLE (FLAGYL) IVPB 500 mg        500 mg 100 mL/hr over 60 Minutes Intravenous Every 12 hours 04/06/23 1149 04/10/23 0959   04/06/23 1000  cefTRIAXone (ROCEPHIN) 2 g in sodium chloride 0.9 % 100 mL IVPB        2 g 200 mL/hr over 30 Minutes Intravenous Every 24 hours 04/06/23 0822 04/09/23 0959   04/04/23 1400  cefTRIAXone (ROCEPHIN) 1 g in sodium chloride 0.9 % 100 mL IVPB        1 g 200 mL/hr over 30 Minutes Intravenous Every 24 hours 04/03/23 1808 04/05/23 1554  04/04/23 0900  metroNIDAZOLE (FLAGYL) IVPB 500 mg        500 mg 100 mL/hr over 60 Minutes Intravenous Every 12 hours 04/04/23 0754 04/05/23 2347   04/03/23 2200  ceFEPIme (MAXIPIME) 2 g in sodium chloride 0.9 % 100 mL IVPB  Status:  Discontinued        2 g 200 mL/hr over 30 Minutes Intravenous Every 12 hours  04/03/23 1521 04/03/23 1525   04/03/23 1700  ceFEPIme (MAXIPIME) 2 g in sodium chloride 0.9 % 100 mL IVPB  Status:  Discontinued        2 g 200 mL/hr over 30 Minutes Intravenous Every 12 hours 04/03/23 1525 04/03/23 1808   04/03/23 1630  vancomycin (VANCOCIN) IVPB 1000 mg/200 mL premix  Status:  Discontinued        1,000 mg 200 mL/hr over 60 Minutes Intravenous Every 48 hours 04/03/23 1521 04/03/23 1808   04/03/23 1300  cefTRIAXone (ROCEPHIN) 1 g in sodium chloride 0.9 % 100 mL IVPB        1 g 200 mL/hr over 30 Minutes Intravenous  Once 04/03/23 1247 04/03/23 1442       Procedures: 04-04-2023 EGD showed - Normal larynx. - Medium-sized hiatal hernia.     - Erosive gastritis. - Non-bleeding gastric ulcer with pigmented  material. - Non-bleeding duodenal ulcer with no stigmata of bleeding.- Normal second portion of the duodenum, third       portion of the duodenum and fourth portion of the duodenum.  Consultants: GI   Hospital Course by Problem: * Acute GI bleeding From Admission until 04-07-2023.  Status post reversal Eliquis with Kcentra. -S/P 3 units of packed red blood cell-. CTA Abdomen: proctocolitis. No directly visualized intraluminal contrast enhancement or other findings to specifically localize GI bleeding. Diffuse inflammatory fat stranding about the pancreas, consistent with acute pancreatitis. -S/P endoscopy 2/07: Erosive Gastritis,  Non-bleeding gastric ulcer with pigmented material. Non-bleeding duodenal ulcer with no stigmata of  bleeding. -Iron levels low. She will need IV iron, her IV got infiltrated today, would do IV iron tomorrow. . Hb stable. . Advanced diet to Soft today  04-08-2023 HgB stable at 10.2. start nu-iron 150 mg daily at [facility]  04-09-2023 stable for DC to SNF today.  Gastric ulcer 04-09-2023 GI recommended "Twice daily pump inhibitors long-term since she developed ulcers on once a day and she denies any aspirin and nonsteroidals at home". Will continue  protonix 40 mg bid indefinitely.  Delirium due to multiple etiologies, acute, hyperactive From Admission until 04-07-2023. Pt had hospital acquired delirium. Pt treated with seroquel. Will continue seroquel at SNF.  Proctocolitis Continue with flagyl for 4 more days. WBC is normal at 8.3  Metabolic acidosis Pt with metabolic acidosis. Had this when she was admitted to hospital in 01-2023. Pt is not dehydrated. BUN <5 and Scr .077.  Will start bicarb 650 mg bid. Repeat outpatient BMET in 1 week.  Essential hypertension On admission. Pt was hypotensive due to blood loss and required IV vasopressor for hypotension and PRBC transfusion Since then, pt's BP has been stable. Remains on norvasc 5 mg every day and Toprol-XL 50 mg qday   Anisocoria - chronic. Chronic.  Hypokalemia 04-08-2023 resolved. Repleted. K 4.3 today.  AKI (acute kidney injury) (HCC) Due to hypotension from acute blood loss. Treated with PRBC transfusion and IVF. AKI has resolved. Scr 0.77  History of stroke - 01-2023. felt to be due to afib. 04-08-2023 Pt had stroke in 01-2023. Was placed on  Eliquis due to new onset afib noted on telemetry. Given recent GI bleeding from gastric ulcer, GI wants her to have repeat EGD in 3 months. Pt has been [place] [facility] in the past. [facility] has deferred further outpatient f/u to her primary GI provider which is [facility].  Pt will need outpatient card appointment in 2-4 weeks to see when Eliquis can be restarted.  Paroxysmal atrial fibrillation (HCC) Stable. On Toprol-XL.  E. coli UTI Treated with 5 days of IV rocephin. Resolved.    Discharge Diagnoses:  Principal Problem:   Acute GI bleeding Active Problems:   Gastric ulcer   Essential hypertension   Metabolic acidosis   Proctocolitis   Delirium due to multiple etiologies, acute, hyperactive   Paroxysmal atrial fibrillation (HCC)   History of stroke - 01-2023. felt to be due to afib.   AKI (acute kidney  injury) (HCC)   Hypokalemia   Anisocoria - chronic.   E. coli UTI   Discharge Instructions  Discharge Instructions     Ambulatory referral to Cardiology   Complete by: As directed    Hx of afib with CVA in 01-2023. Recent gastric ulcer with bleeding. Off Eliquis. Consult to restart Eliquis.   Ambulatory referral to Gastroenterology   Complete by: As directed    What is the reason for referral?: Other Comment - recent gastric ulcer on endoscopy. f/u endo requested by treating hospital gastroenterologist   Call MD for:  difficulty breathing, headache or visual disturbances   Complete by: As directed    Call MD for:  extreme fatigue   Complete by: As directed    Call MD for:  hives   Complete by: As directed    Call MD for:  persistant dizziness or light-headedness   Complete by: As directed    Call MD for:  persistant nausea and vomiting   Complete by: As directed    Call MD for:  redness, tenderness, or signs of infection (pain, swelling, redness, odor or green/yellow discharge around incision site)   Complete by: As directed    Call MD for:  severe uncontrolled pain   Complete by: As directed    Call MD for:  temperature >100.4   Complete by: As directed    DIET SOFT   Complete by: As directed    Discharge instructions   Complete by: As directed    1. Follow up with your primary care provider in 4 weeks 2. Cardiology will call with appointment to be seen in about 4 weeks. 3. Appointment will be made to see [facility] in 4 weeks.   Increase activity slowly   Complete by: As directed       Allergies as of 04/09/2023       Reactions   Pollen Extract Shortness Of Breath   Amlodipine-atorvastatin Other (See Comments)   "Caduet" is the name brand        Medication List     PAUSE taking these medications    apixaban 5 MG Tabs tablet Wait to take this until your doctor or other care provider tells you to start again. Commonly known  as: ELIQUIS Take 1 tablet (5 mg total) by mouth 2 (two) times daily.       STOP taking these medications    lidocaine 5 % Commonly known as: Lidoderm   oxyCODONE-acetaminophen 5-325 MG tablet Commonly known as: PERCOCET/ROXICET       TAKE these medications    acetaminophen 500  MG tablet Commonly known as: TYLENOL Take 2 tablets (1,000 mg total) by mouth every 8 (eight) hours as needed for mild pain (pain score 1-3). What changed: Another medication with the same name was removed. Continue taking this medication, and follow the directions you see here.   amLODipine 5 MG tablet Commonly known as: NORVASC Take 1 tablet (5 mg total) by mouth daily. What changed: when to take this   B-12 PO Take 1 tablet by mouth daily.   iron polysaccharides 150 MG capsule Commonly known as: Nu-Iron Take 1 capsule (150 mg total) by mouth daily.   loperamide 2 MG capsule Commonly known as: IMODIUM Take 1 capsule (2 mg total) by mouth daily as needed for diarrhea or loose stools.   melatonin 5 MG Tabs Take 1 tablet (5 mg total) by mouth at bedtime.   metoprolol succinate 50 MG 24 hr tablet Commonly known as: TOPROL-XL Take 1 tablet (50 mg total) by mouth daily. What changed: how much to take   metroNIDAZOLE 500 MG tablet Commonly known as: Flagyl Take 1 tablet (500 mg total) by mouth 2 (two) times daily for 4 days.   oxyCODONE 5 MG immediate release tablet Commonly known as: Oxy IR/ROXICODONE Take 0.5 tablets (2.5 mg total) by mouth 2 (two) times daily as needed for moderate pain (pain score 4-6) or severe pain (pain score 7-10).   pantoprazole 40 MG tablet Commonly known as: PROTONIX Take 1 tablet (40 mg total) by mouth 2 (two) times daily. What changed: when to take this   potassium chloride 20 MEQ packet Commonly known as: KLOR-CON Take 20 mEq by mouth daily.   QUEtiapine 25 MG tablet Commonly known as: SEROQUEL Take 1 tablet (25 mg total) by mouth 2 (two) times  daily. What changed:  when to take this reasons to take this   sodium bicarbonate 650 MG tablet Take 1 tablet (650 mg total) by mouth 2 (two) times daily.   Ventolin HFA 108 (90 Base) MCG/ACT inhaler Generic drug: albuterol Inhale 1 puff into the lungs See admin instructions. Inhale 1 puff into the lungs ONE TO FOUR TIMES a day as needed for shortness of breath or wheezing        Contact information for follow-up providers     [facility] Follow up.   Why: Follow up with your PCP after rehab. Contact information: [address] [place] [address] [phone]              Contact information for after-discharge care     Destination     [facility] Preferred SNF .   Service: Skilled Nursing Contact information: [address] [place] [address] [phone]                    Allergies  Allergen Reactions   Pollen Extract Shortness Of Breath   Amlodipine-Atorvastatin Other (See Comments)    "Caduet" is the name brand    Discharge Exam: Vitals:   04/09/23 0314 04/09/23 0811  BP: 123/68 121/72  Pulse: 77 70  Resp: 17   Temp: 98.1 F (36.7 C) 98.4 F (36.9 C)  SpO2: 96% 97%    Physical Exam Vitals and nursing note reviewed.  Constitutional:      General: She is not in acute distress.    Appearance: She is not diaphoretic.  HENT:     Head: Normocephalic and atraumatic.  Eyes:     General: No scleral icterus. Cardiovascular:  Rate and Rhythm: Normal rate and regular rhythm.     Heart sounds: Murmur heard.  Pulmonary:     Effort: Pulmonary effort is normal. No respiratory distress.     Breath sounds: No wheezing.  Abdominal:     General: Abdomen is flat. Bowel sounds are normal.     Palpations: Abdomen is soft.  Musculoskeletal:     Right lower leg: No edema.     Left lower leg: No edema.  Skin:    General: Skin is warm and dry.     Capillary Refill: Capillary  refill takes less than 2 seconds.  Neurological:     Mental Status: She is alert. She is disoriented.     The results of significant diagnostics from this hospitalization (including imaging, microbiology, ancillary and laboratory) are listed below for reference.    Microbiology: Recent Results (from the past 240 hours)  Blood culture (routine x 2)     Status: None   Collection Time: 04/03/23  1:40 PM   Specimen: BLOOD LEFT ARM  Result Value Ref Range Status   Specimen Description   Final    BLOOD LEFT ARM Performed at [facility], [address] [place], [place] [address]    Special Requests   Final    BOTTLES DRAWN AEROBIC AND ANAEROBIC Blood Culture results may not be optimal due to an inadequate volume of blood received in culture bottles Performed at [facility], [address], [place], [place] [address]    Culture   Final    NO GROWTH 5 DAYS Performed at [facility], [address], [place], [place] [address]    Report Status 04/08/2023 FINAL  Final  Blood culture (routine x 2)     Status: None   Collection Time: 04/03/23  2:09 PM   Specimen: BLOOD  Result Value Ref Range Status   Specimen Description   Final    BLOOD RIGHT ANTECUBITAL Performed at [facility], [address] [place], [place] [address]    Special Requests   Final    BOTTLES DRAWN AEROBIC AND ANAEROBIC Blood Culture adequate volume Performed at [facility], [address] [place], [place] [address]    Culture   Final    NO GROWTH 5 DAYS Performed at [facility], [address], [place], [place] [address]    Report Status 04/08/2023 FINAL  Final  Urine Culture (for pregnant, neutropenic or urologic patients or patients with an indwelling urinary catheter)     Status: Abnormal   Collection Time: 04/03/23  2:58 PM   Specimen: Urine, Clean Catch  Result Value Ref Range Status   Specimen Description   Final    URINE,  CLEAN CATCH Performed at [facility], [address] [place], [place] [address]    Special Requests   Final    NONE Performed at [facility], [address], [place], [place] [address]    Culture >=100,000 COLONIES/mL ESCHERICHIA COLI (A)  Final   Report Status 04/05/2023 FINAL  Final   Organism ID, Bacteria ESCHERICHIA COLI (A)  Final      Susceptibility   Escherichia coli - MIC*    AMPICILLIN <=2 SENSITIVE Sensitive     CEFAZOLIN <=4 SENSITIVE Sensitive     CEFEPIME <=0.12 SENSITIVE Sensitive     CEFTRIAXONE <=0.25 SENSITIVE Sensitive     CIPROFLOXACIN <=0.25 SENSITIVE Sensitive     GENTAMICIN <=1 SENSITIVE  Sensitive     IMIPENEM <=0.25 SENSITIVE Sensitive     NITROFURANTOIN <=16 SENSITIVE Sensitive     TRIMETH/SULFA <=20 SENSITIVE Sensitive     AMPICILLIN/SULBACTAM <=2 SENSITIVE Sensitive     PIP/TAZO <=4 SENSITIVE Sensitive ug/mL    * >=100,000 COLONIES/mL ESCHERICHIA COLI  MRSA Next Gen by PCR, Nasal     Status: None   Collection Time: 04/03/23  5:28 PM   Specimen: Nasal Mucosa; Nasal Swab  Result Value Ref Range Status   MRSA by PCR Next Gen NOT DETECTED NOT DETECTED Final    Comment: (NOTE) The GeneXpert MRSA Assay (FDA approved for NASAL specimens only), is one component of a comprehensive MRSA colonization surveillance program. It is not intended to diagnose MRSA infection nor to guide or monitor treatment for MRSA infections. Test performance is not FDA approved in patients less than 17 years old. Performed at [facility], [address], [place], [place] [address]   Gastrointestinal Panel by PCR , Stool     Status: None   Collection Time: 04/06/23 11:44 AM   Specimen: Stool  Result Value Ref Range Status   Campylobacter species NOT DETECTED NOT DETECTED Final   Plesimonas shigelloides NOT DETECTED NOT DETECTED Final   Salmonella species NOT DETECTED NOT DETECTED Final   Yersinia enterocolitica NOT  DETECTED NOT DETECTED Final   Vibrio species NOT DETECTED NOT DETECTED Final   Vibrio cholerae NOT DETECTED NOT DETECTED Final   Enteroaggregative E coli (EAEC) NOT DETECTED NOT DETECTED Final   Enteropathogenic E coli (EPEC) NOT DETECTED NOT DETECTED Final   Enterotoxigenic E coli (ETEC) NOT DETECTED NOT DETECTED Final   Shiga like toxin producing E coli (STEC) NOT DETECTED NOT DETECTED Final   Shigella/Enteroinvasive E coli (EIEC) NOT DETECTED NOT DETECTED Final   Cryptosporidium NOT DETECTED NOT DETECTED Final   Cyclospora cayetanensis NOT DETECTED NOT DETECTED Final   Entamoeba histolytica NOT DETECTED NOT DETECTED Final   Giardia lamblia NOT DETECTED NOT DETECTED Final   Adenovirus F40/41 NOT DETECTED NOT DETECTED Final   Astrovirus NOT DETECTED NOT DETECTED Final   Norovirus GI/GII NOT DETECTED NOT DETECTED Final   Rotavirus A NOT DETECTED NOT DETECTED Final   Sapovirus (I, II, IV, and V) NOT DETECTED NOT DETECTED Final    Comment: Performed at [facility], [address] [place], [place] [address]     Labs:  Basic Metabolic Panel: Recent Labs  Lab 04/03/23 1712 04/04/23 0454 04/05/23 0742 04/06/23 0243 04/07/23 0809 04/08/23 0411  NA 141 139 140 141 140 136  K 3.1* 3.0* 3.8 3.3* 2.9* 4.3  CL 118* 118* 114* 117* 117* 116*  CO2 15* 14* 17* 16* 16* 13*  GLUCOSE 139* 91 88 85 91 96  BUN 62* 47* 18 10 5* <5*  CREATININE 0.99 0.96 0.83 0.87 0.73 0.77  CALCIUM 7.0* 7.3* 8.2* 7.8* 7.4* 7.5*  MG 1.3* 2.6*  --   --   --   --   PHOS  --  2.8  --   --   --   --    Liver Function Tests: Recent Labs  Lab 04/03/23 1233 04/04/23 0454  AST 13*  --   ALT 10  --   ALKPHOS 41  --   BILITOT 0.7  --   PROT 5.3*  --   ALBUMIN 2.6* 2.2*   Recent Labs  Lab 04/03/23 1935  LIPASE 27   CBC: Recent Labs  Lab 04/03/23 1233 04/03/23 1712 04/04/23  1021 04/04/23 2002 04/05/23 0742 04/06/23 0243 04/07/23 0424 04/08/23 0844  WBC 8.6   < > 6.9  --  7.1 8.6 7.6 8.3   NEUTROABS 6.9  --   --   --   --   --   --   --   HGB 4.8*   < > 9.0* 8.2* 10.1* 8.9* 8.5* 10.2*  HCT 16.6*   < > 27.5* 26.7* 31.1* 28.2* 27.8* 34.6*  MCV 93.8   < > 87.3  --  88.6 89.0 93.0 94.3  PLT 253   < > 181  --  236 234 263 353   < > = values in this interval not displayed.   Urinalysis    Component Value Date/Time   COLORURINE YELLOW 04/03/2023 1314   APPEARANCEUR CLOUDY (A) 04/03/2023 1314   LABSPEC 1.014 04/03/2023 1314   PHURINE 5.0 04/03/2023 1314   GLUCOSEU NEGATIVE 04/03/2023 1314   HGBUR SMALL (A) 04/03/2023 1314   BILIRUBINUR NEGATIVE 04/03/2023 1314   KETONESUR NEGATIVE 04/03/2023 1314   PROTEINUR NEGATIVE 04/03/2023 1314   UROBILINOGEN 0.2 02/04/2014 2020   NITRITE POSITIVE (A) 04/03/2023 1314   LEUKOCYTESUR LARGE (A) 04/03/2023 1314   Sepsis Labs Recent Labs  Lab 04/05/23 0742 04/06/23 0243 04/07/23 0424 04/08/23 0844  WBC 7.1 8.6 7.6 8.3   Microbiology Recent Results (from the past 240 hours)  Blood culture (routine x 2)     Status: None   Collection Time: 04/03/23  1:40 PM   Specimen: BLOOD LEFT ARM  Result Value Ref Range Status   Specimen Description   Final    BLOOD LEFT ARM Performed at [facility], [address] [place], [place] [address]    Special Requests   Final    BOTTLES DRAWN AEROBIC AND ANAEROBIC Blood Culture results may not be optimal due to an inadequate volume of blood received in culture bottles Performed at [facility], [address] [place], [place] [address]    Culture   Final    NO GROWTH 5 DAYS Performed at [facility], [address] [place] [address] [place], [place] [address]    Report Status 04/08/2023 FINAL  Final  Blood culture (routine x 2)     Status: None   Collection Time: 04/03/23  2:09 PM   Specimen: BLOOD  Result Value Ref Range Status   Specimen Description   Final    BLOOD RIGHT ANTECUBITAL Performed at [facility], [address], [place], [place]  [address]    Special Requests   Final    BOTTLES DRAWN AEROBIC AND ANAEROBIC Blood Culture adequate volume Performed at [facility], [address], [place], [place] [address]    Culture   Final    NO GROWTH 5 DAYS Performed at [facility], [address], [place], [place] [address]    Report Status 04/08/2023 FINAL  Final  Urine Culture (for pregnant, neutropenic or urologic patients or patients with an indwelling urinary catheter)     Status: Abnormal   Collection Time: 04/03/23  2:58 PM   Specimen: Urine, Clean Catch  Result Value Ref Range Status   Specimen Description   Final    URINE, CLEAN CATCH Performed at [facility], [address], [place], [place] [address]    Special Requests   Final    NONE Performed at [facility], [address] [place], [place] [address]    Culture >=100,000  COLONIES/mL ESCHERICHIA COLI (A)  Final   Report Status 04/05/2023 FINAL  Final   Organism ID, Bacteria ESCHERICHIA COLI (A)  Final      Susceptibility   Escherichia coli - MIC*    AMPICILLIN <=2 SENSITIVE Sensitive     CEFAZOLIN <=4 SENSITIVE Sensitive     CEFEPIME <=0.12 SENSITIVE Sensitive     CEFTRIAXONE <=0.25 SENSITIVE Sensitive     CIPROFLOXACIN <=0.25 SENSITIVE Sensitive     GENTAMICIN <=1 SENSITIVE Sensitive     IMIPENEM <=0.25 SENSITIVE Sensitive     NITROFURANTOIN <=16 SENSITIVE Sensitive     TRIMETH/SULFA <=20 SENSITIVE Sensitive     AMPICILLIN/SULBACTAM <=2 SENSITIVE Sensitive     PIP/TAZO <=4 SENSITIVE Sensitive ug/mL    * >=100,000 COLONIES/mL ESCHERICHIA COLI  MRSA Next Gen by PCR, Nasal     Status: None   Collection Time: 04/03/23  5:28 PM   Specimen: Nasal Mucosa; Nasal Swab  Result Value Ref Range Status   MRSA by PCR Next Gen NOT DETECTED NOT DETECTED Final    Comment: (NOTE) The GeneXpert MRSA Assay (FDA approved for NASAL specimens only), is one component of a comprehensive MRSA colonization  surveillance program. It is not intended to diagnose MRSA infection nor to guide or monitor treatment for MRSA infections. Test performance is not FDA approved in patients less than 33 years old. Performed at [facility], [address], [place], [place] [address]   Gastrointestinal Panel by PCR , Stool     Status: None   Collection Time: 04/06/23 11:44 AM   Specimen: Stool  Result Value Ref Range Status   Campylobacter species NOT DETECTED NOT DETECTED Final   Plesimonas shigelloides NOT DETECTED NOT DETECTED Final   Salmonella species NOT DETECTED NOT DETECTED Final   Yersinia enterocolitica NOT DETECTED NOT DETECTED Final   Vibrio species NOT DETECTED NOT DETECTED Final   Vibrio cholerae NOT DETECTED NOT DETECTED Final   Enteroaggregative E coli (EAEC) NOT DETECTED NOT DETECTED Final   Enteropathogenic E coli (EPEC) NOT DETECTED NOT DETECTED Final   Enterotoxigenic E coli (ETEC) NOT DETECTED NOT DETECTED Final   Shiga like toxin producing E coli (STEC) NOT DETECTED NOT DETECTED Final   Shigella/Enteroinvasive E coli (EIEC) NOT DETECTED NOT DETECTED Final   Cryptosporidium NOT DETECTED NOT DETECTED Final   Cyclospora cayetanensis NOT DETECTED NOT DETECTED Final   Entamoeba histolytica NOT DETECTED NOT DETECTED Final   Giardia lamblia NOT DETECTED NOT DETECTED Final   Adenovirus F40/41 NOT DETECTED NOT DETECTED Final   Astrovirus NOT DETECTED NOT DETECTED Final   Norovirus GI/GII NOT DETECTED NOT DETECTED Final   Rotavirus A NOT DETECTED NOT DETECTED Final   Sapovirus (I, II, IV, and V) NOT DETECTED NOT DETECTED Final    Comment: Performed at [facility] [address] [place], [place] [address]    Procedures/Studies: CT ANGIO GI BLEED Result Date: 04/03/2023 CLINICAL DATA:  Melena, concern for upper GI bleed, hypotension EXAM: CTA ABDOMEN AND PELVIS WITHOUT AND WITH CONTRAST TECHNIQUE: Multidetector CT imaging of the abdomen and pelvis was  performed using the standard protocol during bolus administration of intravenous contrast. Multiplanar reconstructed images and MIPs were obtained and reviewed to evaluate the vascular anatomy. RADIATION DOSE REDUCTION: This exam was performed according to the departmental dose-optimization program which includes automated exposure control, adjustment of the mA and/or kV according to patient size and/or use of iterative reconstruction technique. CONTRAST:  80mL OMNIPAQUE IOHEXOL 350 MG/ML SOLN COMPARISON:  CT  pelvis, 02/02/2023 FINDINGS: VASCULAR Normal contour and caliber of the abdominal aorta. Moderate mixed calcific atherosclerosis. No evidence of aneurysm, dissection, or other acute aortic pathology. Standard branching pattern of the abdominal aorta with solitary bilateral renal arteries. Review of the MIP images confirms the above findings. NON-VASCULAR Lower Chest: No acute findings. Large hiatal hernia with nearly complete intrathoracic position of the stomach. Coronary artery calcifications. Hepatobiliary: No solid liver abnormality is seen. Gallstones. No gallbladder wall thickening, or biliary dilatation. Pancreas: Diffuse inflammatory fat stranding about the pancreas. No pancreatic parenchymal necrosis or acute pancreatic fluid collection. Spleen: Normal in size without significant abnormality. Adrenals/Urinary Tract: Adrenal glands are unremarkable. Kidneys are normal, without renal calculi, solid lesion, or hydronephrosis. Bladder is unremarkable. Stomach/Bowel: Stomach is within normal limits. Descending duodenal diverticulum. Evidence of partial right hemicolectomy and ileocolic anastomosis. Diffuse mucosal hyperenhancement of the sigmoid colon and rectum (series 12, image 466). The colon is fluid-filled to the rectum. No directly visualized intraluminal contrast enhancement or other findings to specifically localize GI bleeding. Lymphatic: No enlarged abdominal or pelvic lymph nodes. Reproductive:  No mass or other significant abnormality. Other: No abdominal wall hernia or abnormality. No ascites. Musculoskeletal: Subacute, partially callused fractures of the left pubic rami, seen acutely on examination dated 02/02/2023 (series 6, image 170). Status post left hip total arthroplasty. IMPRESSION: 1. No directly visualized intraluminal contrast enhancement or other findings to specifically localize GI bleeding. 2. Diffuse mucosal hyperenhancement of the sigmoid colon and rectum, consistent with nonspecific infectious or inflammatory proctocolitis. The colon is fluid-filled to the rectum in keeping with diarrheal illness. 3. Diffuse inflammatory fat stranding about the pancreas, consistent with acute pancreatitis. No pancreatic parenchymal necrosis or acute pancreatic fluid collection. 4. Evidence of partial right hemicolectomy and ileocolic anastomosis. 5. Large hiatal hernia with nearly complete intrathoracic position of the stomach. 6. Cholelithiasis. 7. Subacute, partially callused fractures of the left pubic rami, seen acutely on examination dated 02/02/2023. 8. Coronary artery disease. Aortic Atherosclerosis (ICD10-I70.0). Electronically Signed   By: Jearld Lesch M.D.   On: 04/03/2023 18:51    Time coordinating discharge: 40 mins  SIGNED:  Carollee Herter, DO [facility] 04/09/23, 9:30 AM

## 2023-04-09 NOTE — Plan of Care (Signed)
Problem: Education: Goal: Knowledge of General Education information will improve Description: Including pain rating scale, medication(s)/side effects and non-pharmacologic comfort measures Outcome: Not Progressing

## 2023-04-15 ENCOUNTER — Ambulatory Visit: Payer: Medicare HMO | Admitting: Cardiology

## 2023-06-18 ENCOUNTER — Ambulatory Visit: Payer: Medicare HMO | Admitting: Cardiovascular Disease

## 2023-07-22 ENCOUNTER — Encounter: Payer: Self-pay | Admitting: Cardiology

## 2023-07-22 DIAGNOSIS — Z0000 Encounter for general adult medical examination without abnormal findings: Secondary | ICD-10-CM

## 2023-08-07 ENCOUNTER — Other Ambulatory Visit: Payer: Self-pay | Admitting: Nurse Practitioner

## 2023-08-07 DIAGNOSIS — Z1231 Encounter for screening mammogram for malignant neoplasm of breast: Secondary | ICD-10-CM

## 2023-09-04 ENCOUNTER — Ambulatory Visit

## 2023-09-09 ENCOUNTER — Ambulatory Visit

## 2023-09-29 ENCOUNTER — Ambulatory Visit
Admission: RE | Admit: 2023-09-29 | Discharge: 2023-09-29 | Disposition: A | Discharge status: Home / Self Care | Payer: Self-pay | Source: Ambulatory Visit | Attending: Nurse Practitioner | Admitting: Nurse Practitioner

## 2023-09-29 ENCOUNTER — Ambulatory Visit

## 2023-09-29 DIAGNOSIS — Z1231 Encounter for screening mammogram for malignant neoplasm of breast: Secondary | ICD-10-CM

## 2023-10-24 ENCOUNTER — Emergency Department (HOSPITAL_COMMUNITY)

## 2023-10-24 ENCOUNTER — Encounter (HOSPITAL_COMMUNITY): Payer: Self-pay | Admitting: Emergency Medicine

## 2023-10-24 ENCOUNTER — Emergency Department (HOSPITAL_COMMUNITY)
Admission: EM | Admit: 2023-10-24 | Discharge: 2023-10-24 | Disposition: A | Discharge status: Home / Self Care | Attending: Emergency Medicine | Admitting: Emergency Medicine

## 2023-10-24 DIAGNOSIS — W19XXXA Unspecified fall, initial encounter: Secondary | ICD-10-CM | POA: Insufficient documentation

## 2023-10-24 DIAGNOSIS — I10 Essential (primary) hypertension: Secondary | ICD-10-CM | POA: Insufficient documentation

## 2023-10-24 DIAGNOSIS — Z7901 Long term (current) use of anticoagulants: Secondary | ICD-10-CM | POA: Insufficient documentation

## 2023-10-24 DIAGNOSIS — S0012XA Contusion of left eyelid and periocular area, initial encounter: Secondary | ICD-10-CM | POA: Diagnosis not present

## 2023-10-24 DIAGNOSIS — S32020A Wedge compression fracture of second lumbar vertebra, initial encounter for closed fracture: Secondary | ICD-10-CM

## 2023-10-24 DIAGNOSIS — M549 Dorsalgia, unspecified: Secondary | ICD-10-CM | POA: Diagnosis present

## 2023-10-24 DIAGNOSIS — S32028A Other fracture of second lumbar vertebra, initial encounter for closed fracture: Secondary | ICD-10-CM | POA: Insufficient documentation

## 2023-10-24 DIAGNOSIS — R030 Elevated blood-pressure reading, without diagnosis of hypertension: Secondary | ICD-10-CM

## 2023-10-24 MED ORDER — MORPHINE SULFATE (PF) 2 MG/ML IV SOLN
2.0000 mg | Freq: Once | INTRAVENOUS | Status: AC
  Administered 2023-10-24: 2 mg via INTRAVENOUS
  Filled 2023-10-24: qty 1

## 2023-10-24 MED ORDER — TRAMADOL HCL 50 MG PO TABS: 50.0000 mg | ORAL_TABLET | Freq: Three times a day (TID) | ORAL | 0 refills | Status: DC | PRN

## 2023-10-24 MED ORDER — ONDANSETRON HCL 4 MG/2ML IJ SOLN
4.0000 mg | Freq: Once | INTRAMUSCULAR | Status: AC
  Administered 2023-10-24: 4 mg via INTRAVENOUS
  Filled 2023-10-24: qty 2

## 2023-10-24 NOTE — ED Triage Notes (Signed)
 Per EMS from home. Unwitnessed fall last Sunday. No blood thinners. C/o pain, decrease in mobility, lower back and right hip pain over past week.  BP 144/90 HR 95 CBG 104 96 on RA

## 2023-10-24 NOTE — ED Notes (Signed)
Changed pt brief.

## 2023-10-24 NOTE — ED Provider Notes (Signed)
 [place] EMERGENCY DEPARTMENT AT [facility] Provider Note   CSN: [id number] Arrival date & time: 10/24/23  9152     Patient presents with: Fall, Groin Pain, Hip Pain, and Back Pain   Monica Conway is a 78 y.o. female.   Pt with c/o fall six days ago, falling onto buttocks (was mechanical fall, no faintness or dizziness, no loc w fall) - since then c/o pain to 'tailbone' and bil hip pain. Has been ambulatory since - at baseline, uses walker and wheelchair for mobility.  Has tried pain meds at home which initially did help some but now c/o persistent pain to these areas.   The history is provided by the patient, medical records and the EMS personnel.       Prior to Admission medications   Medication Sig Start Date End Date Taking? Authorizing Provider  acetaminophen  (TYLENOL ) 500 MG tablet Take 2 tablets (1,000 mg total) by mouth every 8 (eight) hours as needed for mild pain (pain score 1-3). Patient not taking: Reported on 04/03/2023 01/31/23   Jolaine Pac, DO  amLODipine  (NORVASC ) 5 MG tablet Take 1 tablet (5 mg total) by mouth daily. Patient taking differently: Take 5 mg by mouth in the morning. 02/01/23   Jolaine Pac, DO  apixaban  (ELIQUIS ) 5 MG TABS tablet Take 1 tablet (5 mg total) by mouth 2 (two) times daily. 01/31/23   Jolaine Pac, DO  Cyanocobalamin (B-12 PO) Take 1 tablet by mouth daily.    [provider]  iron  polysaccharides (NU-IRON ) 150 MG capsule Take 1 capsule (150 mg total) by mouth daily. 04/09/23 05/09/23  Laurence Locus, DO  loperamide  (IMODIUM ) 2 MG capsule Take 1 capsule (2 mg total) by mouth daily as needed for diarrhea or loose stools. 01/31/23   Jolaine Pac, DO  melatonin 5 MG TABS Take 1 tablet (5 mg total) by mouth at bedtime. 01/31/23   Jolaine Pac, DO  metoprolol  succinate (TOPROL -XL) 50 MG 24 hr tablet Take 1 tablet (50 mg total) by mouth daily. 04/09/23   Laurence Locus, DO  pantoprazole  (PROTONIX ) 40 MG tablet Take 1 tablet (40  mg total) by mouth 2 (two) times daily. 04/09/23 04/08/24  Laurence Locus, DO  potassium chloride  (KLOR-CON ) 20 MEQ packet Take 20 mEq by mouth daily. 04/09/23 05/09/23  Laurence Locus, DO  QUEtiapine  (SEROQUEL ) 25 MG tablet Take 1 tablet (25 mg total) by mouth 2 (two) times daily. 04/09/23   Laurence Locus, DO  VENTOLIN  HFA 108 (90 Base) MCG/ACT inhaler Inhale 1 puff into the lungs See admin instructions. Inhale 1 puff into the lungs ONE TO FOUR TIMES a day as needed for shortness of breath or wheezing 10/21/22   [provider]    Allergies: Pollen extract and Amlodipine -atorvastatin    Review of Systems  Constitutional:  Negative for chills and fever.  Respiratory:  Negative for shortness of breath.   Cardiovascular:  Negative for chest pain.  Gastrointestinal:  Negative for abdominal pain, nausea and vomiting.  Genitourinary:  Negative for flank pain.  Musculoskeletal:  Negative for neck pain.  Skin:  Negative for wound.  Neurological:  Negative for weakness, numbness and headaches.    Updated Vital Signs BP (!) 145/98   Pulse 61   Temp 97.9 F (36.6 C) (Oral)   Resp 18   SpO2 99%   Physical Exam Vitals and nursing note reviewed.  Constitutional:      Appearance: Normal appearance. She is well-developed.  HENT:  Head:     Comments: Bruise to left periorbital area.     Nose: Nose normal.     Mouth/Throat:     Mouth: Mucous membranes are moist.  Eyes:     General: No scleral icterus.    Conjunctiva/sclera: Conjunctivae normal.     Pupils: Pupils are equal, round, and reactive to light.  Neck:     Trachea: No tracheal deviation.  Cardiovascular:     Rate and Rhythm: Normal rate and regular rhythm.     Pulses: Normal pulses.     Heart sounds: Normal heart sounds. No murmur heard.    No friction rub. No gallop.  Pulmonary:     Effort: Pulmonary effort is normal. No respiratory distress.     Breath sounds: Normal breath sounds.  Chest:     Chest wall: No tenderness.   Abdominal:     General: Bowel sounds are normal. There is no distension.     Palpations: Abdomen is soft.     Tenderness: There is no abdominal tenderness. There is no guarding.     Comments: No abdominal bruising or contusion.   Genitourinary:    Comments: No cva tenderness.  Musculoskeletal:        General: No swelling.     Cervical back: Normal range of motion and neck supple. No rigidity or tenderness. No muscular tenderness.     Comments: CTL spine, non tender, aligned, no step off. Tenderness to sacrum/coccyx area, no significant sts or skin changes noted to area of pain.  No extremity deformity noted, pain bil groin and hip area. No skin lesions noted. Otherwise good rom bil extremities without other pain or focal bony tenderness. Distal pulses palp bil.    Skin:    General: Skin is warm and dry.     Findings: No rash.  Neurological:     Mental Status: She is alert.     Comments: Alert, speech normal. GCS 15. Motor/sens grossly intact bil.   Psychiatric:        Mood and Affect: Mood normal.     (all labs ordered are listed, but only abnormal results are displayed) Labs Reviewed - No data to display  EKG: None  Radiology: DG Hips Bilat W or Wo Pelvis 3-4 Views Result Date: 10/24/2023 EXAM: 5 VIEW(S) XRAY OF THE BILATERAL HIPS 10/24/2023 11:06:00 AM COMPARISON: None available. CLINICAL HISTORY: Fall, pain. Per chart: Per EMS from home. Unwitnessed fall last Sunday. No blood thinners. C/o pain, decrease in mobility, lower back and right hip pain over past week. 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 Remote left parasymphyseal pubic ramus fracture. Degenerative changes of the lower lumbar spine. SOFT TISSUES: Vascular calcifications. IMPRESSION: 1. No acute fracture or dislocation. 2. 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 3. Remote left parasymphyseal pubic ramus fracture. Electronically signed by: Lonni Necessary MD 10/24/2023 11:32 AM EDT RP  Workstation: [id number]   DG Sacrum/Coccyx Result Date: 10/24/2023 EXAM: 3 VIEW(S) XRAY OF THE SACRUM AND COCCYX 10/24/2023 11:06:00 AM COMPARISON: None available. CLINICAL HISTORY: Fall, pain. Per chart: Per EMS from home. Unwitnessed fall last Sunday. No blood thinners. C/o pain, decrease in mobility, lower back and right hip pain over past week. FINDINGS: BONES AND JOINTS: Left total hip arthroplasty noted. Healing left parasymphyseal pubic rami fracture is noted. No acute fracture. No joint dislocation. SOFT TISSUES: The soft tissues are unremarkable. IMPRESSION: 1. Healing left parasymphyseal pubic rami fracture. 2. No acute findings. Electronically signed by: Lonni Necessary  MD 10/24/2023 11:30 AM EDT RP Workstation: [id number]   DG Lumbar Spine Complete Result Date: 10/24/2023 EXAM: 4 VIEW(S) XRAY OF THE LUMBAR SPINE 10/24/2023 11:06:00 AM COMPARISON: CTA abdomen and pelvis 04/03/2023. CLINICAL HISTORY: Fall, pain. Per chart: Per EMS from home. Unwitnessed fall last Sunday. No blood thinners. C/o pain, decrease in mobility, lower back and right hip pain over past week. FINDINGS: LUMBAR SPINE: BONES: A new L2 compression fracture is more severe left than right. No significant retropulsed bone is present. Vertebral body heights are otherwise maintained. Left parasymphyseal pubic rami fractures are again noted. DISCS AND DEGENERATIVE CHANGES: No severe degenerative changes. SOFT TISSUES: No acute abnormality. VASCULATURE: Atherosclerotic changes are present in the aorta. No aneurysm is present. JOINTS: Left total hip arthroplasty. IMPRESSION: 1. New L2 compression fracture, more severe left than right, without significant retropulsed bone. 2. Remote left parasymphyseal pubic rami fractures. Electronically signed by: Lonni Necessary MD 10/24/2023 11:27 AM EDT RP Workstation: [id number]     Procedures   Medications Ordered in the ED  morphine  (PF) 2 MG/ML injection 2 mg (2 mg Intravenous Given  10/24/23 1019)  ondansetron  (ZOFRAN ) injection 4 mg (4 mg Intravenous Given 10/24/23 1020)                                    Medical Decision Making Problems Addressed: Accidental fall, initial encounter: acute illness or injury with systemic symptoms that poses a threat to life or bodily functions Compression fracture of L2 vertebra, initial encounter [facility]): acute illness or injury Elevated blood pressure reading: acute illness or injury Essential hypertension: chronic illness or injury with exacerbation, progression, or side effects of treatment that poses a threat to life or bodily functions  Amount and/or Complexity of Data Reviewed Independent Historian: EMS    Details: hx External Data Reviewed: notes. Radiology: ordered and independent interpretation performed. Decision-making details documented in ED Course.  Risk Prescription drug management. Parenteral controlled substances. Decision regarding hospitalization.   Iv ns. Continuous pulse ox and cardiac monitoring. Imaging ordered.   Differential diagnosis includes coccyx fracture, hip fracture, etc. Dispo decision including potential need for admission considered - will get imaging and reassess.   Reviewed nursing notes and prior charts for additional history. External reports reviewed. Additional history from: EMS.   Morphine  iv. Zofran  iv.   Cardiac monitor: sinus rhythm, rate 80.  Xrays reviewed/interpreted by me - L2 compression fx. Old pubic rami fxs.   Pt comfortable, pain controlled. No radicular pain. No numbness/weakness. No problems w normal bowel and bladder fxn.   Pt currently appears stable for ED d/c.   Rec close pcp f/u. Will also make home health referral - may benefit from additional home services. Pt also indicates has family that can/do help her.   Return precautions provided.       Final diagnoses:  Accidental fall, initial encounter  Compression fracture of L2 vertebra, initial encounter  [facility])    ED Discharge Orders     None          Bernard Drivers, MD 10/24/23 [phone]

## 2023-10-24 NOTE — Discharge Instructions (Addendum)
 It was our pleasure to provide your ER care today - we hope that you feel better.  Fall precautions - use walker and/or wheelchair, to help minimize risk of falling.   Take acetaminophen  or ibuprofen as need for pain. You may also take ultram  as need for muscle pain/spasm - no driving when taking.   Follow up with primary care doctor in the next 1-2 weeks if symptoms fail to improve/resolve. Also follow up with your doctor regarding your blood pressure that is high today.  We have also made a home health referral for additional home services.   Return to ER if worse, new symptoms, fevers, severe/intractable pain, numbness/weakness, problems with bowel/bladder function, or other concern.

## 2023-10-25 ENCOUNTER — Observation Stay (HOSPITAL_COMMUNITY)
Admission: EM | Admit: 2023-10-25 | Discharge: 2023-10-27 | Disposition: A | Discharge status: Skilled Nursing Facility | Attending: Internal Medicine | Admitting: Internal Medicine

## 2023-10-25 ENCOUNTER — Encounter (HOSPITAL_COMMUNITY): Payer: Self-pay

## 2023-10-25 DIAGNOSIS — R8290 Unspecified abnormal findings in urine: Secondary | ICD-10-CM | POA: Diagnosis not present

## 2023-10-25 DIAGNOSIS — Z79899 Other long term (current) drug therapy: Secondary | ICD-10-CM | POA: Diagnosis not present

## 2023-10-25 DIAGNOSIS — I10 Essential (primary) hypertension: Secondary | ICD-10-CM | POA: Diagnosis not present

## 2023-10-25 DIAGNOSIS — J45909 Unspecified asthma, uncomplicated: Secondary | ICD-10-CM | POA: Insufficient documentation

## 2023-10-25 DIAGNOSIS — S32000A Wedge compression fracture of unspecified lumbar vertebra, initial encounter for closed fracture: Principal | ICD-10-CM | POA: Diagnosis present

## 2023-10-25 DIAGNOSIS — I480 Paroxysmal atrial fibrillation: Secondary | ICD-10-CM | POA: Diagnosis not present

## 2023-10-25 DIAGNOSIS — Y849 Medical procedure, unspecified as the cause of abnormal reaction of the patient, or of later complication, without mention of misadventure at the time of the procedure: Secondary | ICD-10-CM | POA: Insufficient documentation

## 2023-10-25 DIAGNOSIS — S32020A Wedge compression fracture of second lumbar vertebra, initial encounter for closed fracture: Secondary | ICD-10-CM

## 2023-10-25 DIAGNOSIS — M549 Dorsalgia, unspecified: Secondary | ICD-10-CM | POA: Diagnosis present

## 2023-10-25 DIAGNOSIS — R7401 Elevation of levels of liver transaminase levels: Secondary | ICD-10-CM | POA: Diagnosis not present

## 2023-10-25 DIAGNOSIS — E876 Hypokalemia: Secondary | ICD-10-CM | POA: Diagnosis not present

## 2023-10-25 DIAGNOSIS — S32028A Other fracture of second lumbar vertebra, initial encounter for closed fracture: Secondary | ICD-10-CM | POA: Diagnosis not present

## 2023-10-25 DIAGNOSIS — Z96642 Presence of left artificial hip joint: Secondary | ICD-10-CM | POA: Diagnosis not present

## 2023-10-25 LAB — CBC
HCT: 43.7 % (ref 36.0–46.0)
Hemoglobin: 14 g/dL (ref 12.0–15.0)
MCH: 30.9 pg (ref 26.0–34.0)
MCHC: 32 g/dL (ref 30.0–36.0)
MCV: 96.5 fL (ref 80.0–100.0)
Platelets: 376 K/uL (ref 150–400)
RBC: 4.53 MIL/uL (ref 3.87–5.11)
RDW: 16.8 % — ABNORMAL HIGH (ref 11.5–15.5)
WBC: 6.5 K/uL (ref 4.0–10.5)
nRBC: 1.1 % — ABNORMAL HIGH (ref 0.0–0.2)

## 2023-10-25 LAB — URINALYSIS, ROUTINE W REFLEX MICROSCOPIC
Bilirubin Urine: NEGATIVE
Glucose, UA: NEGATIVE mg/dL
Ketones, ur: NEGATIVE mg/dL
Nitrite: POSITIVE — AB
Protein, ur: 100 mg/dL — AB
Specific Gravity, Urine: 1.011 (ref 1.005–1.030)
pH: 6 (ref 5.0–8.0)

## 2023-10-25 LAB — COMPREHENSIVE METABOLIC PANEL WITH GFR
ALT: 52 U/L — ABNORMAL HIGH (ref 0–44)
AST: 69 U/L — ABNORMAL HIGH (ref 15–41)
Albumin: 3.4 g/dL — ABNORMAL LOW (ref 3.5–5.0)
Alkaline Phosphatase: 190 U/L — ABNORMAL HIGH (ref 38–126)
Anion gap: 15 (ref 5–15)
BUN: 18 mg/dL (ref 8–23)
CO2: 15 mmol/L — ABNORMAL LOW (ref 22–32)
Calcium: 8.9 mg/dL (ref 8.9–10.3)
Chloride: 109 mmol/L (ref 98–111)
Creatinine, Ser: 1 mg/dL (ref 0.44–1.00)
GFR, Estimated: 58 mL/min — ABNORMAL LOW (ref >60)
Glucose, Bld: 102 mg/dL — ABNORMAL HIGH (ref 70–99)
Potassium: 3.1 mmol/L — ABNORMAL LOW (ref 3.5–5.1)
Sodium: 139 mmol/L (ref 135–145)
Total Bilirubin: 0.7 mg/dL (ref 0.0–1.2)
Total Protein: 7.3 g/dL (ref 6.5–8.1)

## 2023-10-25 LAB — LIPASE, BLOOD: Lipase: 60 U/L — ABNORMAL HIGH (ref 11–51)

## 2023-10-25 LAB — HEPATITIS PANEL, ACUTE
HCV Ab: NONREACTIVE
Hep A IgM: NONREACTIVE
Hep B C IgM: NONREACTIVE
Hepatitis B Surface Ag: NONREACTIVE

## 2023-10-25 MED ORDER — ENOXAPARIN SODIUM 40 MG/0.4ML IJ SOSY
40.0000 mg | PREFILLED_SYRINGE | INTRAMUSCULAR | Status: DC
  Administered 2023-10-25 – 2023-10-26 (×2): 40 mg via SUBCUTANEOUS
  Filled 2023-10-25 (×2): qty 0.4

## 2023-10-25 MED ORDER — SODIUM CHLORIDE 0.9 % IV SOLN: 1.0000 g | INTRAVENOUS | Status: DC

## 2023-10-25 MED ORDER — METOPROLOL SUCCINATE ER 50 MG PO TB24
50.0000 mg | ORAL_TABLET | Freq: Every day | ORAL | Status: DC
  Administered 2023-10-25 – 2023-10-27 (×3): 50 mg via ORAL
  Filled 2023-10-25 (×3): qty 1

## 2023-10-25 MED ORDER — ONDANSETRON HCL 4 MG PO TABS
4.0000 mg | ORAL_TABLET | Freq: Four times a day (QID) | ORAL | Status: DC | PRN
  Administered 2023-10-26: 4 mg via ORAL
  Filled 2023-10-25: qty 1

## 2023-10-25 MED ORDER — POTASSIUM CHLORIDE CRYS ER 20 MEQ PO TBCR
40.0000 meq | EXTENDED_RELEASE_TABLET | Freq: Once | ORAL | Status: AC
  Administered 2023-10-25: 40 meq via ORAL
  Filled 2023-10-25: qty 2

## 2023-10-25 MED ORDER — ACETAMINOPHEN 650 MG RE SUPP: 650.0000 mg | Freq: Four times a day (QID) | RECTAL | Status: DC | PRN

## 2023-10-25 MED ORDER — TRAMADOL HCL 50 MG PO TABS
50.0000 mg | ORAL_TABLET | Freq: Four times a day (QID) | ORAL | Status: DC | PRN
  Administered 2023-10-25 – 2023-10-27 (×4): 50 mg via ORAL
  Filled 2023-10-25 (×5): qty 1

## 2023-10-25 MED ORDER — MORPHINE SULFATE (PF) 4 MG/ML IV SOLN
4.0000 mg | Freq: Once | INTRAVENOUS | Status: AC
  Administered 2023-10-25: 4 mg via INTRAVENOUS
  Filled 2023-10-25: qty 1

## 2023-10-25 MED ORDER — KETOROLAC TROMETHAMINE 15 MG/ML IJ SOLN
15.0000 mg | Freq: Once | INTRAMUSCULAR | Status: AC
  Administered 2023-10-25: 15 mg via INTRAVENOUS
  Filled 2023-10-25: qty 1

## 2023-10-25 MED ORDER — DOCUSATE SODIUM 100 MG PO CAPS
100.0000 mg | ORAL_CAPSULE | Freq: Two times a day (BID) | ORAL | Status: DC
  Filled 2023-10-25 (×2): qty 1

## 2023-10-25 MED ORDER — HYDROMORPHONE HCL 1 MG/ML IJ SOLN
0.5000 mg | INTRAMUSCULAR | Status: DC | PRN
  Filled 2023-10-25: qty 1

## 2023-10-25 MED ORDER — ONDANSETRON HCL 4 MG/2ML IJ SOLN: 4.0000 mg | Freq: Four times a day (QID) | INTRAMUSCULAR | Status: DC | PRN

## 2023-10-25 MED ORDER — POLYETHYLENE GLYCOL 3350 17 G PO PACK: 17.0000 g | PACK | Freq: Every day | ORAL | Status: DC | PRN

## 2023-10-25 MED ORDER — ACETAMINOPHEN 325 MG PO TABS: 650.0000 mg | ORAL_TABLET | Freq: Four times a day (QID) | ORAL | Status: DC | PRN

## 2023-10-25 MED ORDER — SODIUM CHLORIDE 0.9 % IV BOLUS: 1000.0000 mL | Freq: Once | INTRAVENOUS | Status: DC

## 2023-10-25 MED ORDER — SODIUM CHLORIDE 0.9 % IV SOLN
1.0000 g | Freq: Once | INTRAVENOUS | Status: AC
  Administered 2023-10-25: 1 g via INTRAVENOUS
  Filled 2023-10-25: qty 10

## 2023-10-25 MED ORDER — ALBUTEROL SULFATE (2.5 MG/3ML) 0.083% IN NEBU: 2.5000 mg | INHALATION_SOLUTION | RESPIRATORY_TRACT | Status: DC | PRN

## 2023-10-25 MED ORDER — TRAZODONE HCL 50 MG PO TABS: 25.0000 mg | ORAL_TABLET | Freq: Every evening | ORAL | Status: DC | PRN

## 2023-10-25 NOTE — ED Triage Notes (Addendum)
 Pt BIB EMS from home for fall 1 week ago,no thinners. Seen yesterday for sacral pain but still in pain. Ambulatory at baseline. New bruise on face. Did not take meds this morning. AO4. Pt currently complaining of diarrhea.   EMS vitals  BP 180/106 HR 86 SPO2 99 RA CBG 102

## 2023-10-25 NOTE — Progress Notes (Signed)
 Orthopedic Tech Progress Note Patient Details:  Monica Conway [birth year] [id number]  Ortho Devices Type of Ortho Device: Lumbar corsett Ortho Device/Splint Location: Lumbar spine Ortho Device/Splint Interventions: Application   Post Interventions Patient Tolerated: Well  Massie BRAVO [name] 10/25/2023, 12:14 PM

## 2023-10-25 NOTE — ED Provider Notes (Signed)
 [place] EMERGENCY DEPARTMENT AT [facility] Provider Note   CSN: [id number] Arrival date & time: 10/25/23  9097     Patient presents with: Back Pain   Monica Conway is a 78 y.o. female with past medical history significant for hypertension, arthritis, asthma who presents with concern for ongoing back and hip pain.  She fell 1 week ago.  She is not currently taking any blood thinners.  Triage note reports new bruise on face, however a bruise was present at her previous evaluation, shows signs of obvious healing.  She did not take any for blood pressure medication today.  She endorses some diarrhea.  She reports that she took the tramadol  that we have prescribed, but that she has not had any relief of her pain.    Back Pain      Prior to Admission medications   Medication Sig Start Date End Date Taking? Authorizing Provider  acetaminophen  (TYLENOL ) 500 MG tablet Take 2 tablets (1,000 mg total) by mouth every 8 (eight) hours as needed for mild pain (pain score 1-3). Patient not taking: Reported on 04/03/2023 01/31/23   Jolaine Pac, DO  amLODipine  (NORVASC ) 5 MG tablet Take 1 tablet (5 mg total) by mouth daily. Patient taking differently: Take 5 mg by mouth in the morning. 02/01/23   Jolaine Pac, DO  apixaban  (ELIQUIS ) 5 MG TABS tablet Take 1 tablet (5 mg total) by mouth 2 (two) times daily. 01/31/23   Jolaine Pac, DO  Cyanocobalamin (B-12 PO) Take 1 tablet by mouth daily.    [provider]  iron  polysaccharides (NU-IRON ) 150 MG capsule Take 1 capsule (150 mg total) by mouth daily. 04/09/23 05/09/23  Laurence Locus, DO  loperamide  (IMODIUM ) 2 MG capsule Take 1 capsule (2 mg total) by mouth daily as needed for diarrhea or loose stools. 01/31/23   Jolaine Pac, DO  melatonin 5 MG TABS Take 1 tablet (5 mg total) by mouth at bedtime. 01/31/23   Jolaine Pac, DO  metoprolol  succinate (TOPROL -XL) 50 MG 24 hr tablet Take 1 tablet (50 mg total) by mouth daily. 04/09/23    Laurence Locus, DO  pantoprazole  (PROTONIX ) 40 MG tablet Take 1 tablet (40 mg total) by mouth 2 (two) times daily. 04/09/23 04/08/24  Laurence Locus, DO  potassium chloride  (KLOR-CON ) 20 MEQ packet Take 20 mEq by mouth daily. 04/09/23 05/09/23  Laurence Locus, DO  QUEtiapine  (SEROQUEL ) 25 MG tablet Take 1 tablet (25 mg total) by mouth 2 (two) times daily. 04/09/23   Laurence Locus, DO  traMADol  (ULTRAM ) 50 MG tablet Take 1 tablet (50 mg total) by mouth 3 (three) times daily as needed. 10/24/23   Bernard Drivers, MD  VENTOLIN  HFA 108 (90 Base) MCG/ACT inhaler Inhale 1 puff into the lungs See admin instructions. Inhale 1 puff into the lungs ONE TO FOUR TIMES a day as needed for shortness of breath or wheezing 10/21/22   [provider]    Allergies: Pollen extract and Amlodipine -atorvastatin    Review of Systems  Musculoskeletal:  Positive for back pain.  All other systems reviewed and are negative.   Updated Vital Signs BP (!) 162/87 (BP Location: Left Arm)   Pulse 72   Temp 97.9 F (36.6 C) (Oral)   Resp 18   Ht 5' 2 (1.575 m)   Wt 54.6 kg   SpO2 98%   BMI 22.02 kg/m   Physical Exam Vitals and nursing note reviewed.  Constitutional:      General: She is  not in acute distress.    Appearance: Normal appearance.  HENT:     Head: Normocephalic and atraumatic.  Eyes:     General:        Right eye: No discharge.        Left eye: No discharge.  Cardiovascular:     Rate and Rhythm: Normal rate and regular rhythm.     Pulses: Normal pulses.     Heart sounds: No murmur heard.    No friction rub. No gallop.  Pulmonary:     Effort: Pulmonary effort is normal.     Breath sounds: Normal breath sounds.  Abdominal:     General: Bowel sounds are normal.     Palpations: Abdomen is soft.  Musculoskeletal:     Comments: Tenderness to palpation in the midline lumbar spine, decreased in 4/5 of bilateral lower extremities to flexion, extension secondary to pain.  No leg length discrepancy, no significant  tenderness to palpation over trochanters.  Significant difficulty walking with assistance secondary to pain, but overall pain is more well-controlled at rest  Skin:    General: Skin is warm and dry.     Capillary Refill: Capillary refill takes less than 2 seconds.  Neurological:     Mental Status: She is alert and oriented to person, place, and time.  Psychiatric:        Mood and Affect: Mood normal.        Behavior: Behavior normal.     (all labs ordered are listed, but only abnormal results are displayed) Labs Reviewed  CBC - Abnormal; Notable for the following components:      Result Value   RDW 16.8 (*)    nRBC 1.1 (*)    All other components within normal limits  COMPREHENSIVE METABOLIC PANEL WITH GFR - Abnormal; Notable for the following components:   Potassium 3.1 (*)    CO2 15 (*)    Glucose, Bld 102 (*)    Albumin 3.4 (*)    AST 69 (*)    ALT 52 (*)    Alkaline Phosphatase 190 (*)    GFR, Estimated 58 (*)    All other components within normal limits  LIPASE, BLOOD - Abnormal; Notable for the following components:   Lipase 60 (*)    All other components within normal limits  URINALYSIS, ROUTINE W REFLEX MICROSCOPIC - Abnormal; Notable for the following components:   APPearance HAZY (*)    Hgb urine dipstick SMALL (*)    Protein, ur 100 (*)    Nitrite POSITIVE (*)    Leukocytes,Ua SMALL (*)    Bacteria, UA MANY (*)    All other components within normal limits  URINE CULTURE    EKG: None  Radiology: DG Hips Bilat W or Wo Pelvis 3-4 Views Result Date: 10/24/2023 EXAM: 5 VIEW(S) XRAY OF THE BILATERAL HIPS 10/24/2023 11:06:00 AM COMPARISON: None available. CLINICAL HISTORY: Fall, pain. Per chart: Per EMS from home. Unwitnessed fall last Sunday. No blood thinners. C/o pain, decrease in mobility, lower back and right hip pain over past week. 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 Remote left parasymphyseal pubic ramus fracture.  Degenerative changes of the lower lumbar spine. SOFT TISSUES: Vascular calcifications. IMPRESSION: 1. No acute fracture or dislocation. 2. 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 3. Remote left parasymphyseal pubic ramus fracture. Electronically signed by: Lonni Necessary MD 10/24/2023 11:32 AM EDT RP Workstation: [id number]   DG Sacrum/Coccyx Result Date: 10/24/2023 EXAM: 3 VIEW(S) XRAY  OF THE SACRUM AND COCCYX 10/24/2023 11:06:00 AM COMPARISON: None available. CLINICAL HISTORY: Fall, pain. Per chart: Per EMS from home. Unwitnessed fall last Sunday. No blood thinners. C/o pain, decrease in mobility, lower back and right hip pain over past week. FINDINGS: BONES AND JOINTS: Left total hip arthroplasty noted. Healing left parasymphyseal pubic rami fracture is noted. No acute fracture. No joint dislocation. SOFT TISSUES: The soft tissues are unremarkable. IMPRESSION: 1. Healing left parasymphyseal pubic rami fracture. 2. No acute findings. Electronically signed by: Lonni Necessary MD 10/24/2023 11:30 AM EDT RP Workstation: [id number]   DG Lumbar Spine Complete Result Date: 10/24/2023 EXAM: 4 VIEW(S) XRAY OF THE LUMBAR SPINE 10/24/2023 11:06:00 AM COMPARISON: CTA abdomen and pelvis 04/03/2023. CLINICAL HISTORY: Fall, pain. Per chart: Per EMS from home. Unwitnessed fall last Sunday. No blood thinners. C/o pain, decrease in mobility, lower back and right hip pain over past week. FINDINGS: LUMBAR SPINE: BONES: A new L2 compression fracture is more severe left than right. No significant retropulsed bone is present. Vertebral body heights are otherwise maintained. Left parasymphyseal pubic rami fractures are again noted. DISCS AND DEGENERATIVE CHANGES: No severe degenerative changes. SOFT TISSUES: No acute abnormality. VASCULATURE: Atherosclerotic changes are present in the aorta. No aneurysm is present. JOINTS: Left total hip arthroplasty. IMPRESSION: 1. New L2 compression fracture, more severe left than right,  without significant retropulsed bone. 2. Remote left parasymphyseal pubic rami fractures. Electronically signed by: Lonni Necessary MD 10/24/2023 11:27 AM EDT RP Workstation: [id number]     Procedures   Medications Ordered in the ED  metoprolol  succinate (TOPROL -XL) 24 hr tablet 50 mg (50 mg Oral Given 10/25/23 1016)  cefTRIAXone  (ROCEPHIN ) 1 g in sodium chloride  0.9 % 100 mL IVPB (1 g Intravenous New Bag/Given 10/25/23 1254)  morphine  (PF) 4 MG/ML injection 4 mg (4 mg Intravenous Given 10/25/23 1140)  ketorolac  (TORADOL ) 15 MG/ML injection 15 mg (15 mg Intravenous Given 10/25/23 1139)  potassium chloride  SA (KLOR-CON  M) CR tablet 40 mEq (40 mEq Oral Given 10/25/23 1154)                                    Medical Decision Making Amount and/or Complexity of Data Reviewed Labs: ordered.  Risk Prescription drug management.   This patient is a 78 y.o. female  who presents to the ED for concern of back pain, dysuria, inability to walk.   Differential diagnoses prior to evaluation: The emergent differential diagnosis includes, but is not limited to, complication related to recent known compression fracture, new UTI, pyelonephritis, versus other. This is not an exhaustive differential.   Past Medical History / Co-morbidities / Social History: Arthritis, asthma, hypertension, previous CVA  Additional history: Chart reviewed. Pertinent results include: Imaging from emergency department yesterday shows new L2 compression fracture  Physical Exam: Physical exam performed. The pertinent findings include: Tenderness to palpation in the midline lumbar spine, decreased in 4/5 of bilateral lower extremities to flexion, extension secondary to pain.  No leg length discrepancy, no significant tenderness to palpation over trochanters.  Significant difficulty walking with assistance secondary to pain, but overall pain is more well-controlled at rest  Lab Tests/Imaging studies: I personally  interpreted labs/imaging and the pertinent results include: CMP with hypokalemia, Tessman 3.1, we will orally replete, mild elevation of liver enzymes, AST 69, ALT 52, alkaline phosphatase 190, lipase also mildly elevated at 60, normal total bilirubin, no significant abdominal pain.  CBC unremarkable.  Her UA is consistent with acute urinary tract infection, nitrate positive, leukocytes, many bacteria and white blood cells.  Medications: I ordered medication including potassium for hypokalemia, morphine , Toradol  for pain, Rocephin  for urinary tract infection.  I have reviewed the patients home medicines and have made adjustments as needed.   Consults: I spoke with the hospitalist, Dr. Zella who agrees to admission for acute urinary tract infection, pain control from new lumbar compression fracture  Disposition: After consideration of the diagnostic results and the patients response to treatment, I feel that patient would benefit from admission as discussed above .    Final diagnoses:  None    ED Discharge Orders     None          Rosan Sherlean VEAR DEVONNA 10/25/23 1318    Francesca Elsie CROME, MD 10/25/23 1339

## 2023-10-25 NOTE — H&P (Signed)
 History and Physical  Monica Conway FMW:[id number] DOB: 27-Jan-1946 DOA: 10/25/2023  PCP: [facility]   Chief Complaint: Back pain  HPI: Monica Conway is a 78 y.o. female with medical history significant for prior CVA, bilateral knee severe arthritis, hypertension, A-fib no longer on Eliquis  due to GI bleed now being admitted to the hospital with intractable pain and inability to ambulate due to L5 compression fracture.  Patient lives by herself in the community, with the assistance of some friends and family members who are able to provide intermittent support.  States she was in her usual state of health until about 6 days ago, when she had gone shopping and unfortunately the trunk lid of her car came down too fast and hit her on the top of the head, causing her to fall backwards onto her buttocks.  She did not lose consciousness, states that she did not hit her head on the ground.  She was able to get up on her own and ambulate, but yesterday due to continued pain she came to the emergency department for evaluation.  X-rays demonstrated L2 compression fracture, she was given pain medication and was able to ambulate without significant pain and was discharged home in stable condition with plan for outpatient PCP follow-up.  States that after he went home yesterday, she feels about the couch and when she woke up, she had severe low back pain.  Denies any urinary incontinence difficulty with bowels or urination, no dysuria, fevers or chills.  She had severe low back pain that was nonradiating, but made it impossible for her to walk.  She had to call EMS due to pain and inability to ambulate and was brought back to the ER for evaluation.  Here, she had evidence of abnormal urinalysis, was given a dose of IV Rocephin .  Hospitalist was contacted for admission for pain control, PT/OT and possible placement.  Review of Systems: Please see HPI for pertinent positives and negatives. A complete 10 system  review of systems are otherwise negative.  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/26/2023   Closed left hip fracture (HCC) 08/03/2015   Hypertension    Osteoporosis    Past Surgical History:  Procedure Laterality Date   COLON SURGERY     ESOPHAGOGASTRODUODENOSCOPY N/A 04/04/2023   Procedure: ESOPHAGOGASTRODUODENOSCOPY (EGD);  Surgeon: Rosalie Kitchens, MD;  Location: [facility];  Service: Gastroenterology;  Laterality: N/A;   TOTAL HIP ARTHROPLASTY Left 08/03/2015   Procedure: TOTAL LEFT HIP ARTHROPLASTY ANTERIOR APPROACH;  Surgeon: Redell Shoals, MD;  Location: [facility];  Service: Orthopedics;  Laterality: Left;   TUBAL LIGATION     Social History:  reports that she has never smoked. She has never used smokeless tobacco. She reports that she does not drink alcohol  and does not use drugs.  Allergies  Allergen Reactions   Pollen Extract Shortness Of Breath   Amlodipine -Atorvastatin Other (See Comments)    Caduet is the name brand    Family History  Problem Relation Age of Onset   Cataracts Sister    Hypertension Sister    Hypertension Brother      Prior to Admission medications   Medication Sig Start Date End Date Taking? Authorizing Provider  acetaminophen  (TYLENOL ) 500 MG tablet Take 2 tablets (1,000 mg total) by mouth every  8 (eight) hours as needed for mild pain (pain score 1-3). Patient not taking: Reported on 04/03/2023 01/31/23   Jolaine Pac, DO  amLODipine  (NORVASC ) 5 MG tablet Take 1 tablet (5 mg total) by mouth daily. Patient taking differently: Take 5 mg by mouth in the morning. 02/01/23   Jolaine Pac, DO  apixaban  (ELIQUIS ) 5 MG TABS tablet Take 1 tablet (5 mg total) by mouth 2 (two) times daily. 01/31/23   Jolaine Pac, DO  Cyanocobalamin (B-12 PO) Take 1 tablet by mouth daily.     [provider]  iron  polysaccharides (NU-IRON ) 150 MG capsule Take 1 capsule (150 mg total) by mouth daily. 04/09/23 05/09/23  Laurence Locus, DO  loperamide  (IMODIUM ) 2 MG capsule Take 1 capsule (2 mg total) by mouth daily as needed for diarrhea or loose stools. 01/31/23   Jolaine Pac, DO  melatonin 5 MG TABS Take 1 tablet (5 mg total) by mouth at bedtime. 01/31/23   Jolaine Pac, DO  metoprolol  succinate (TOPROL -XL) 50 MG 24 hr tablet Take 1 tablet (50 mg total) by mouth daily. 04/09/23   Laurence Locus, DO  pantoprazole  (PROTONIX ) 40 MG tablet Take 1 tablet (40 mg total) by mouth 2 (two) times daily. 04/09/23 04/08/24  Laurence Locus, DO  potassium chloride  (KLOR-CON ) 20 MEQ packet Take 20 mEq by mouth daily. 04/09/23 05/09/23  Laurence Locus, DO  QUEtiapine  (SEROQUEL ) 25 MG tablet Take 1 tablet (25 mg total) by mouth 2 (two) times daily. 04/09/23   Laurence Locus, DO  traMADol  (ULTRAM ) 50 MG tablet Take 1 tablet (50 mg total) by mouth 3 (three) times daily as needed. 10/24/23   Bernard Drivers, MD  VENTOLIN  HFA 108 (90 Base) MCG/ACT inhaler Inhale 1 puff into the lungs See admin instructions. Inhale 1 puff into the lungs ONE TO FOUR TIMES a day as needed for shortness of breath or wheezing 10/21/22   [provider]    Physical Exam: BP (!) 162/87 (BP Location: Left Arm)   Pulse 72   Temp 97.9 F (36.6 C) (Oral)   Resp 18   Ht 5' 2 (1.575 m)   Wt 54.6 kg   SpO2 98%   BMI 22.02 kg/m  General:  Alert, oriented, calm, in no acute distress, pleasant and cooperative resting comfortably on room air.  No family at the bedside. Eyes: EOMI, clear conjuctivae, white sclerea, left-sided periorbital bruising Neck: supple, no masses, trachea mildline  Cardiovascular: RRR, no murmurs or rubs, no peripheral edema  Respiratory: clear to auscultation bilaterally, no wheezes, no crackles  Abdomen: soft, nontender, nondistended, normal bowel tones heard  Skin: dry, no rashes  Musculoskeletal: no joint  effusions, normal range of motion  Psychiatric: appropriate affect, normal speech  Neurologic: extraocular muscles intact, clear speech, moving all extremities with intact sensorium         Labs on Admission:  Basic Metabolic Panel: Recent Labs  Lab 10/25/23 1019  NA 139  K 3.1*  CL 109  CO2 15*  GLUCOSE 102*  BUN 18  CREATININE 1.00  CALCIUM  8.9   Liver Function Tests: Recent Labs  Lab 10/25/23 1019  AST 69*  ALT 52*  ALKPHOS 190*  BILITOT 0.7  PROT 7.3  ALBUMIN 3.4*   Recent Labs  Lab 10/25/23 1019  LIPASE 60*   No results for input(s): AMMONIA in the last 168 hours. CBC: Recent Labs  Lab 10/25/23 1019  WBC 6.5  HGB 14.0  HCT 43.7  MCV 96.5  PLT 376  Cardiac Enzymes: No results for input(s): CKTOTAL, CKMB, CKMBINDEX, TROPONINI in the last 168 hours. BNP (last 3 results) No results for input(s): BNP in the last 8760 hours.  ProBNP (last 3 results) No results for input(s): PROBNP in the last 8760 hours.  CBG: No results for input(s): GLUCAP in the last 168 hours.  Radiological Exams on Admission: DG Hips Bilat W or Wo Pelvis 3-4 Views Result Date: 10/24/2023 EXAM: 5 VIEW(S) XRAY OF THE BILATERAL HIPS 10/24/2023 11:06:00 AM COMPARISON: None available. CLINICAL HISTORY: Fall, pain. Per chart: Per EMS from home. Unwitnessed fall last Sunday. No blood thinners. C/o pain, decrease in mobility, lower back and right hip pain over past week. 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 Remote left parasymphyseal pubic ramus fracture. Degenerative changes of the lower lumbar spine. SOFT TISSUES: Vascular calcifications. IMPRESSION: 1. No acute fracture or dislocation. 2. 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 3. Remote left parasymphyseal pubic ramus fracture. Electronically signed by: Lonni Necessary MD 10/24/2023 11:32 AM EDT RP Workstation: [id number]   DG Sacrum/Coccyx Result Date: 10/24/2023 EXAM: 3 VIEW(S) XRAY OF THE  SACRUM AND COCCYX 10/24/2023 11:06:00 AM COMPARISON: None available. CLINICAL HISTORY: Fall, pain. Per chart: Per EMS from home. Unwitnessed fall last Sunday. No blood thinners. C/o pain, decrease in mobility, lower back and right hip pain over past week. FINDINGS: BONES AND JOINTS: Left total hip arthroplasty noted. Healing left parasymphyseal pubic rami fracture is noted. No acute fracture. No joint dislocation. SOFT TISSUES: The soft tissues are unremarkable. IMPRESSION: 1. Healing left parasymphyseal pubic rami fracture. 2. No acute findings. Electronically signed by: Lonni Necessary MD 10/24/2023 11:30 AM EDT RP Workstation: [id number]   DG Lumbar Spine Complete Result Date: 10/24/2023 EXAM: 4 VIEW(S) XRAY OF THE LUMBAR SPINE 10/24/2023 11:06:00 AM COMPARISON: CTA abdomen and pelvis 04/03/2023. CLINICAL HISTORY: Fall, pain. Per chart: Per EMS from home. Unwitnessed fall last Sunday. No blood thinners. C/o pain, decrease in mobility, lower back and right hip pain over past week. FINDINGS: LUMBAR SPINE: BONES: A new L2 compression fracture is more severe left than right. No significant retropulsed bone is present. Vertebral body heights are otherwise maintained. Left parasymphyseal pubic rami fractures are again noted. DISCS AND DEGENERATIVE CHANGES: No severe degenerative changes. SOFT TISSUES: No acute abnormality. VASCULATURE: Atherosclerotic changes are present in the aorta. No aneurysm is present. JOINTS: Left total hip arthroplasty. IMPRESSION: 1. New L2 compression fracture, more severe left than right, without significant retropulsed bone. 2. Remote left parasymphyseal pubic rami fractures. Electronically signed by: Lonni Necessary MD 10/24/2023 11:27 AM EDT RP Workstation: [id number]   Assessment/Plan Ronal SQUIBB Camilo is a 78 y.o. female with medical history significant for prior CVA, bilateral knee severe arthritis, hypertension, A-fib no longer on Eliquis  due to GI bleed now being admitted to  the hospital with intractable pain and inability to ambulate due to L2 compression fracture.   L2 compression fracture-due to mechanical fall now about 1 week ago.  Patient initially had good pain control in the emergency department on 8/30 was able to ambulate but pain has now worsened. -Inpatient admission -Pain control as needed -Lumbar corset has been placed -PT/OT -May consider IR consult for kyphoplasty in the coming days in case of intractable pain or inability to ambulate  Abnormal urinalysis-without acute infectious symptoms.  Patient does not have a UTI.  No further antibiotics.  Abnormal LFTs-unclear etiology, avoid hepatotoxins, check acute hepatitis panel  Hypertension-amlodipine   Paroxysmal atrial fibrillation-continue Toprol -XL, no longer anticoagulation due  to prior history of GI hemorrhage  DVT prophylaxis: Lovenox      Code Status: Limited: Do not attempt resuscitation (DNR) -DNR-LIMITED -Do Not Intubate/DNI   Consults called: None  Admission status: The appropriate patient status for this patient is INPATIENT. Inpatient status is judged to be reasonable and necessary in order to provide the required intensity of service to ensure the patient's safety. The patient's presenting symptoms, physical exam findings, and initial radiographic and laboratory data in the context of their chronic comorbidities is felt to place them at high risk for further clinical deterioration. Furthermore, it is not anticipated that the patient will be medically stable for discharge from the hospital within 2 midnights of admission.    I certify that at the point of admission it is my clinical judgment that the patient will require inpatient hospital care spanning beyond 2 midnights from the point of admission due to high intensity of service, high risk for further deterioration and high frequency of surveillance required  Time spent: 56 minutes  Verdun Rackley CHRISTELLA Gail MD [facility] Pager  [phone]  If 7PM-7AM, please contact night-coverage [email or web address] Password [email or web address]  10/25/2023, 1:23 PM

## 2023-10-25 NOTE — ED Notes (Signed)
 Unable to obtain IV after 2 attempts. IV consult initiated

## 2023-10-25 NOTE — ED Notes (Signed)
 PA is aware of pt's BP readings. PA at bedside.

## 2023-10-26 ENCOUNTER — Other Ambulatory Visit: Payer: Self-pay

## 2023-10-26 DIAGNOSIS — S32020A Wedge compression fracture of second lumbar vertebra, initial encounter for closed fracture: Secondary | ICD-10-CM | POA: Diagnosis not present

## 2023-10-26 LAB — HEPATIC FUNCTION PANEL
ALT: 58 U/L — ABNORMAL HIGH (ref 0–44)
AST: 98 U/L — ABNORMAL HIGH (ref 15–41)
Albumin: 3.1 g/dL — ABNORMAL LOW (ref 3.5–5.0)
Alkaline Phosphatase: 176 U/L — ABNORMAL HIGH (ref 38–126)
Bilirubin, Direct: 0.2 mg/dL (ref 0.0–0.2)
Indirect Bilirubin: 0.2 mg/dL — ABNORMAL LOW (ref 0.3–0.9)
Total Bilirubin: 0.4 mg/dL (ref 0.0–1.2)
Total Protein: 6.3 g/dL — ABNORMAL LOW (ref 6.5–8.1)

## 2023-10-26 LAB — BASIC METABOLIC PANEL WITH GFR
Anion gap: 13 (ref 5–15)
BUN: 18 mg/dL (ref 8–23)
CO2: 15 mmol/L — ABNORMAL LOW (ref 22–32)
Calcium: 8.3 mg/dL — ABNORMAL LOW (ref 8.9–10.3)
Chloride: 111 mmol/L (ref 98–111)
Creatinine, Ser: 0.83 mg/dL (ref 0.44–1.00)
GFR, Estimated: >60 mL/min (ref >60)
Glucose, Bld: 95 mg/dL (ref 70–99)
Potassium: 3.3 mmol/L — ABNORMAL LOW (ref 3.5–5.1)
Sodium: 139 mmol/L (ref 135–145)

## 2023-10-26 LAB — CBC
HCT: 41.6 % (ref 36.0–46.0)
Hemoglobin: 13.2 g/dL (ref 12.0–15.0)
MCH: 30.5 pg (ref 26.0–34.0)
MCHC: 31.7 g/dL (ref 30.0–36.0)
MCV: 96.1 fL (ref 80.0–100.0)
Platelets: 399 K/uL (ref 150–400)
RBC: 4.33 MIL/uL (ref 3.87–5.11)
RDW: 16.8 % — ABNORMAL HIGH (ref 11.5–15.5)
WBC: 5.7 K/uL (ref 4.0–10.5)
nRBC: 0.9 % — ABNORMAL HIGH (ref 0.0–0.2)

## 2023-10-26 MED ORDER — POTASSIUM CHLORIDE CRYS ER 10 MEQ PO TBCR
30.0000 meq | EXTENDED_RELEASE_TABLET | ORAL | Status: AC
  Administered 2023-10-26 (×2): 30 meq via ORAL
  Filled 2023-10-26 (×2): qty 1

## 2023-10-26 MED ORDER — LIDOCAINE 5 % EX PTCH
1.0000 | MEDICATED_PATCH | CUTANEOUS | Status: DC
  Administered 2023-10-26 – 2023-10-27 (×2): 1 via TRANSDERMAL
  Filled 2023-10-26 (×2): qty 1

## 2023-10-26 MED ORDER — KETOROLAC TROMETHAMINE 15 MG/ML IJ SOLN
15.0000 mg | Freq: Four times a day (QID) | INTRAMUSCULAR | Status: DC | PRN
  Administered 2023-10-26 – 2023-10-27 (×2): 15 mg via INTRAVENOUS
  Filled 2023-10-26 (×2): qty 1

## 2023-10-26 NOTE — Evaluation (Signed)
 Physical Therapy Evaluation Patient Details Name: Monica Conway MRN: [id number] DOB: 17-Sep-1945 Today's Date: 10/26/2023  History of Present Illness  Monica Conway is a 78 y.o. female admitted 10/25/23 with intractable pain and inability to ambulate due to L5 compression fracture after he trunk lid of her car came down too fast and hit her on the top of the head, causing her to fall backwards onto her buttocks. PMH includes prior CVA, bilateral knee severe arthritis, hypertension, A-fib no longer on Eliquis  due to GI bleed  Clinical Impression  Pt admitted with above diagnosis.  Pt is typically modified independent in ADL and mobility. She uses a RW or manual WC depending on the day and the distance, and still drives. Today despite pain she is willing to work with therapy because she wants to return to bed.. Educated on back precautions and don/doff of brace, also educated on bed mobility and log roll to maintain back precautions. Pt reporting pain incr pain with mobility overall mod assist for bed mobility and transfers.  Patient will benefit from continued inpatient follow up therapy, <3 hours/day    Pt currently with functional limitations due to the deficits listed below (see PT Problem List). Pt will benefit from acute skilled PT to increase their independence and safety with mobility to allow discharge.         If plan is discharge home, recommend the following: Assistance with cooking/housework;Help with stairs or ramp for entrance;Assist for transportation;A lot of help with walking and/or transfers;A lot of help with bathing/dressing/bathroom   Can travel by private vehicle   No    Equipment Recommendations None recommended by PT  Recommendations for Other Services       Functional Status Assessment Patient has had a recent decline in their functional status and demonstrates the ability to make significant improvements in function in a reasonable and predictable amount of time.      Precautions / Restrictions Precautions Precautions: Back Recall of Precautions/Restrictions: Impaired Precaution/Restrictions Comments: 0/3 back precautions recalled; reviewed again with pt Required Braces or Orthoses: Spinal Brace Spinal Brace: Lumbar corset;Applied in sitting position Restrictions Other Position/Activity Restrictions: reviewed donning and doffing - pt requires full assist to remove brace      Mobility  Bed Mobility Overal bed mobility: Needs Assistance Bed Mobility: Rolling, Sidelying to Sit Rolling: Min assist, Used rails Sidelying to sit: Mod assist, Min assist       General bed mobility comments: educated in bed mobility for log roll technique, assist to lift LEs on to bed for return to s/l, multi-modal cues for technique, sequence and back precautions; rolls R and L for pillow placement    Transfers Overall transfer level: Needs assistance Equipment used: Rolling walker (2 wheels) Transfers: Sit to/from Stand, Bed to chair/wheelchair/BSC Sit to Stand: Mod assist   Step pivot transfers: Min assist       General transfer comment: STS x2 from chair d/t initial dizziness, resolved on 2nd trial. increased time for all transfers due to patients anxiety    Ambulation/Gait                  Stairs            Wheelchair Mobility     Tilt Bed    Modified Rankin (Stroke Patients Only)       Balance Overall balance assessment: Needs assistance Sitting-balance support: Bilateral upper extremity supported, Feet supported Sitting balance-Leahy Scale: Fair     Standing balance support: Bilateral  upper extremity supported, Reliant on assistive device for balance, During functional activity Standing balance-Leahy Scale: Poor Standing balance comment: reliant on device                             Pertinent Vitals/Pain Pain Assessment Pain Assessment: Faces Faces Pain Scale: Hurts even more Pain Location: back Pain  Descriptors / Indicators: Grimacing, Guarding, Discomfort Pain Intervention(s): Limited activity within patient's tolerance, Monitored during session, Repositioned, Other (comment) (LSO)    Home Living Family/patient expects to be discharged to:: Private residence Living Arrangements: Alone Available Help at Discharge: Family;Available PRN/intermittently;Neighbor Type of Home: House Home Access: Ramped entrance       Home Layout: One level Home Equipment: Agricultural consultant (2 wheels);Wheelchair - manual;Shower seat;Grab bars - tub/shower;Hand held shower head      Prior Function Prior Level of Function : Independent/Modified Independent;Driving             Mobility Comments: RW or WC depending on the day distance ADLs Comments: independent; has someone that helps her with groceries     Extremity/Trunk Assessment   Upper Extremity Assessment Upper Extremity Assessment: Defer to OT evaluation    Lower Extremity Assessment Lower Extremity Assessment: RLE deficits/detail;LLE deficits/detail RLE Deficits / Details: knee flexion limited in sitting ~ 80-85 degrees, pt no amenable to furhter assessment LLE Deficits / Details: knee flexion limited in sitting ~ 80-85 degrees, pt no amenable to furhter assessment    Cervical / Trunk Assessment Cervical / Trunk Assessment: Other exceptions Cervical / Trunk Exceptions: back precautions L5 compression fx  Communication   Communication Communication: Impaired Factors Affecting Communication: Hearing impaired    Cognition Arousal: Alert Behavior During Therapy: WFL for tasks assessed/performed, Anxious   PT - Cognitive impairments: No family/caregiver present to determine baseline, Memory, Sequencing                       PT - Cognition Comments: pt repeats one, two, three god help me  multiple times prior to standing Following commands: Intact (incr time needed)       Cueing Cueing Techniques: Verbal cues, Visual  cues, Gestural cues     General Comments General comments (skin integrity, edema, etc.): educated Pt on brace, don/doff, and back precautions (which will require continued reinforcement)    Exercises     Assessment/Plan    PT Assessment Patient needs continued PT services  PT Problem List Decreased strength;Decreased range of motion;Decreased activity tolerance;Decreased balance;Decreased knowledge of use of DME;Decreased mobility;Pain;Decreased safety awareness;Decreased knowledge of precautions       PT Treatment Interventions DME instruction;Therapeutic exercise;Gait training;Functional mobility training;Therapeutic activities;Patient/family education    PT Goals (Current goals can be found in the Care Plan section)  Acute Rehab PT Goals PT Goal Formulation: With patient Time For Goal Achievement: 11/09/23 Potential to Achieve Goals: Good    Frequency Min 3X/week     Co-evaluation               AM-PAC PT 6 Clicks Mobility  Outcome Measure Help needed turning from your back to your side while in a flat bed without using bedrails?: A Little Help needed moving from lying on your back to sitting on the side of a flat bed without using bedrails?: A Little Help needed moving to and from a bed to a chair (including a wheelchair)?: A Little Help needed standing up from a chair using your arms (e.g., wheelchair  or bedside chair)?: A Little Help needed to walk in hospital room?: A Lot Help needed climbing 3-5 steps with a railing? : A Lot 6 Click Score: 16    End of Session Equipment Utilized During Treatment: Gait belt Activity Tolerance: Patient limited by fatigue;Patient limited by pain Patient left: in bed;with call bell/phone within reach;with bed alarm set Nurse Communication: Mobility status PT Visit Diagnosis: Other abnormalities of gait and mobility (R26.89)    Time: 8864-8844 PT Time Calculation (min) (ACUTE ONLY): 20 min   Charges:   PT Evaluation $PT  Eval Low Complexity: 1 Low   PT General Charges $$ ACUTE PT VISIT: 1 Visit         Jesusa Stenerson, PT  Acute Rehab Dept ([facility]) [phone]  10/26/2023   [facility] 10/26/2023, 12:13 PM

## 2023-10-26 NOTE — Progress Notes (Signed)
 PROGRESS NOTE    [facility] PHA  FMW:[id number] DOB: Jan 02, 1946 DOA: 10/25/2023 PCP: [facility]    Brief Narrative:   Monica Conway is a 78 y.o. female with past medical history significant for HTN, history of paroxysmal atrial fibrillation no longer on Eliquis  due to GI bleed, bilateral knee osteoarthritis who presented to [facility] ED on 10/25/2023 from home via EMS complaining of back pain, difficulty with ambulation.  Patient reports roughly 6 days prior to admission while she was shopping, her trunk lid came down too fast and hit her on the top of her head causing her to fall backwards onto her buttocks.  Denies loss of consciousness.  Did not strike her head 1 falling to the ground.  She was able to get up on her own accord and ambulate but since has had progressive pain now causing difficulty with ambulation.  She was seen day prior in the ED, x-rays notable for L2 compression fracture, given pain medication and was able to ambulate without significant pain and was discharged home in stable condition with plan outpatient follow-up with her PCP.  For follow return home, her symptoms recurred with severe low back pain; denies urinary continence, no issues with bowel/bladder.  Encephali called EMS and was taken to the ED for further evaluation.  In the ED, temperature 97.9 F, HR 89, RR 19, BP 182/104, SpO2 99% on room air.  WBC 6.5, hemoglobin 14.0, platelet count 376.  Sodium 139, potassium 3.1, chloride 109, CO2 15, glucose 102, BUN 18, creat 1.00.  AST 69, ALT 52, total bilirubin 0.7.  Urinalysis with small leukocytes, positive nitrite, many bacteria, 11-20 WBCs.  Assessment & Plan:   L2 compression fracture, acute Intractable pain Patient presenting to the ED with progressive/intractable pain now with difficulty ambulating.  Sustained mechanical fall roughly 6 days prior to admission.  Imaging notable for L2 compression fracture, more severe left than right without significant  retropulsed bone, remote left parasymphyseal pubic rami fractures. -- Lidocaine  patch -- Toradol  50 mg IV every 6 hours.  Mild pain -- Tramadol  50 mg p.o. every 6 hours as needed moderate pain -- PT/OT evaluation: Pending  Elevated LFTs Patient presenting with LFT elevation with ALT 69, ALT 52, normal total bilirubin.  Acute hepatitis panel negative. -- AST 69>98 -- ALT 52>58 -- Tbil 0.7>0.4 -- Avoid hepatotoxins -- Continue to trend LFTs  Hypokalemia Potassium 3.3, will replete. -- repeat electrolytes in the a.m.  Abnormal urinalysis Urinalysis with small leukocytes, positive nitrite, many bacteria, 11-20WBCs.  Patient denies any urinary symptoms.  Received 1 dose of IV ceftriaxone  in the ED. -- Continue to monitor off of antibiotics given asymptomatic  HTN Paroxysmal atrial fibrillation not on anticoagulation Patient no longer on Eliquis  due to history of GI bleed. -- Metoprolol  succinate 50 mg p.o. daily  DVT prophylaxis: enoxaparin  (LOVENOX ) injection 40 mg Start: 10/25/23 2200    Code Status: Limited: Do not attempt resuscitation (DNR) -DNR-LIMITED -Do Not Intubate/DNI  Family Communication: No family present at bedside this morning  Disposition Plan:  Level of care: Med-Surg Status is: Inpatient Remains inpatient appropriate because: Pending PT/OT evaluation, pain control    Consultants:  None  Procedures:  None  Antimicrobials:  Ceftriaxone  8/31 - 8/31   Subjective: Patient seen examined bedside, lying in bed.  Continues to complain of low back pain.  Patient would like the medication that I received overnight to be discontinued as it caused me to be sleepy.  Awaiting PT/OT evaluation.  No other questions or concerns at this time.  Denies headache, no dizziness, no chest pain, no palpitations, no shortness of breath, no abdominal pain, no fever/chills, no nausea/vomiting/diarrhea.  No acute events overnight per nursing staff.  Objective: Vitals:    10/25/23 1826 10/25/23 2221 10/26/23 0110 10/26/23 0623  BP: 126/78 (!) 154/83 (!) 141/80 (!) 148/80  Pulse: 65 (!) 58 63 75  Resp: 17 16 16 17   Temp: 98.3 F (36.8 C) 98.6 F (37 C) 98.4 F (36.9 C) 98.3 F (36.8 C)  TempSrc: Oral Oral Oral Oral  SpO2: 100% 99% 98% 96%  Weight:      Height:        Intake/Output Summary (Last 24 hours) at 10/26/2023 1048 Last data filed at 10/26/2023 9376 Gross per 24 hour  Intake 520 ml  Output 0 ml  Net 520 ml   Filed Weights   10/25/23 0929  Weight: 54.6 kg    Examination:  Physical Exam: GEN: NAD, alert and oriented x 3, elderly in appearance HEENT: NCAT, PERRL, EOMI, sclera clear, MMM, ecchymosis surrounding left forehead/orbit PULM: CTAB w/o wheezes/crackles, normal respiratory effort, on room air CV: RRR w/o M/G/R GI: abd soft, NTND, + BS MSK: no peripheral edema, muscle strength globally intact 5/5 bilateral upper/lower extremities, + TTP mid/lower back midline NEURO: CN II-XII intact, no focal deficits, sensation to light touch intact PSYCH: normal mood/affect Integumentary: Left forehead/orbit ecchymosis as above, otherwise no other concerning rashes/lesions/wounds noted on exposed skin surfaces     Data Reviewed: I have personally reviewed following labs and imaging studies  CBC: Recent Labs  Lab 10/25/23 1019 10/26/23 0325  WBC 6.5 5.7  HGB 14.0 13.2  HCT 43.7 41.6  MCV 96.5 96.1  PLT 376 399   Basic Metabolic Panel: Recent Labs  Lab 10/25/23 1019 10/26/23 0325  NA 139 139  K 3.1* 3.3*  CL 109 111  CO2 15* 15*  GLUCOSE 102* 95  BUN 18 18  CREATININE 1.00 0.83  CALCIUM  8.9 8.3*   GFR: Estimated Creatinine Clearance: 44.9 mL/min (by C-G formula based on SCr of 0.83 mg/dL). Liver Function Tests: Recent Labs  Lab 10/25/23 1019 10/26/23 0325  AST 69* 98*  ALT 52* 58*  ALKPHOS 190* 176*  BILITOT 0.7 0.4  PROT 7.3 6.3*  ALBUMIN 3.4* 3.1*   Recent Labs  Lab 10/25/23 1019  LIPASE 60*   No results  for input(s): AMMONIA in the last 168 hours. Coagulation Profile: No results for input(s): INR, PROTIME in the last 168 hours. Cardiac Enzymes: No results for input(s): CKTOTAL, CKMB, CKMBINDEX, TROPONINI in the last 168 hours. BNP (last 3 results) No results for input(s): PROBNP in the last 8760 hours. HbA1C: No results for input(s): HGBA1C in the last 72 hours. CBG: No results for input(s): GLUCAP in the last 168 hours. Lipid Profile: No results for input(s): CHOL, HDL, LDLCALC, TRIG, CHOLHDL, LDLDIRECT in the last 72 hours. Thyroid Function Tests: No results for input(s): TSH, T4TOTAL, FREET4, T3FREE, THYROIDAB in the last 72 hours. Anemia Panel: No results for input(s): VITAMINB12, FOLATE, FERRITIN, TIBC, IRON , RETICCTPCT in the last 72 hours. Sepsis Labs: No results for input(s): PROCALCITON, LATICACIDVEN in the last 168 hours.  Recent Results (from the past 240 hours)  Urine Culture     Status: Abnormal (Preliminary result)   Collection Time: 10/25/23  1:50 PM   Specimen: Urine, Clean Catch  Result Value Ref Range Status   Specimen Description   Final    URINE,  CLEAN CATCH Performed at [facility], [address] [place], [place] [address]    Special Requests   Final    NONE Performed at [facility], [address] [place], [place] [address]    Culture >=100,000 COLONIES/mL GRAM NEGATIVE RODS (A)  Final   Report Status PENDING  Incomplete         Radiology Studies: DG Hips Bilat W or Wo Pelvis 3-4 Views Result Date: 10/24/2023 EXAM: 5 VIEW(S) XRAY OF THE BILATERAL HIPS 10/24/2023 11:06:00 AM COMPARISON: None available. CLINICAL HISTORY: Fall, pain. Per chart: Per EMS from home. Unwitnessed fall last Sunday. No blood thinners. C/o pain, decrease in mobility, lower back and right hip pain over past week. 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  Remote left parasymphyseal pubic ramus fracture. Degenerative changes of the lower lumbar spine. SOFT TISSUES: Vascular calcifications. IMPRESSION: 1. No acute fracture or dislocation. 2. 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 3. Remote left parasymphyseal pubic ramus fracture. Electronically signed by: Lonni Necessary MD 10/24/2023 11:32 AM EDT RP Workstation: [id number]   DG Sacrum/Coccyx Result Date: 10/24/2023 EXAM: 3 VIEW(S) XRAY OF THE SACRUM AND COCCYX 10/24/2023 11:06:00 AM COMPARISON: None available. CLINICAL HISTORY: Fall, pain. Per chart: Per EMS from home. Unwitnessed fall last Sunday. No blood thinners. C/o pain, decrease in mobility, lower back and right hip pain over past week. FINDINGS: BONES AND JOINTS: Left total hip arthroplasty noted. Healing left parasymphyseal pubic rami fracture is noted. No acute fracture. No joint dislocation. SOFT TISSUES: The soft tissues are unremarkable. IMPRESSION: 1. Healing left parasymphyseal pubic rami fracture. 2. No acute findings. Electronically signed by: Lonni Necessary MD 10/24/2023 11:30 AM EDT RP Workstation: [id number]   DG Lumbar Spine Complete Result Date: 10/24/2023 EXAM: 4 VIEW(S) XRAY OF THE LUMBAR SPINE 10/24/2023 11:06:00 AM COMPARISON: CTA abdomen and pelvis 04/03/2023. CLINICAL HISTORY: Fall, pain. Per chart: Per EMS from home. Unwitnessed fall last Sunday. No blood thinners. C/o pain, decrease in mobility, lower back and right hip pain over past week. FINDINGS: LUMBAR SPINE: BONES: A new L2 compression fracture is more severe left than right. No significant retropulsed bone is present. Vertebral body heights are otherwise maintained. Left parasymphyseal pubic rami fractures are again noted. DISCS AND DEGENERATIVE CHANGES: No severe degenerative changes. SOFT TISSUES: No acute abnormality. VASCULATURE: Atherosclerotic changes are present in the aorta. No aneurysm is present. JOINTS: Left total hip arthroplasty. IMPRESSION: 1. New L2  compression fracture, more severe left than right, without significant retropulsed bone. 2. Remote left parasymphyseal pubic rami fractures. Electronically signed by: Lonni Necessary MD 10/24/2023 11:27 AM EDT RP Workstation: [id number]        Scheduled Meds:  docusate sodium   100 mg Oral BID   enoxaparin  (LOVENOX ) injection  40 mg Subcutaneous Q24H   lidocaine   1 patch Transdermal Q24H   metoprolol  succinate  50 mg Oral Daily   potassium chloride   30 mEq Oral Q3H   Continuous Infusions:   LOS: 1 day    Time spent: 51 minutes spent on 10/26/2023 caring for this patient face-to-face including chart review, ordering labs/tests, documenting, discussion with nursing staff, consultants, updating family and interview/physical exam    Camellia PARAS [place], DO [facility] Available via Epic secure chat 7am-7pm After these hours, please refer to coverage provider listed on [email or web address] 10/26/2023, 10:48 AM
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
## 2023-10-26 NOTE — NC FL2 (Signed)
 [place]  MEDICAID FL2 LEVEL OF CARE FORM     IDENTIFICATION  Patient Name: Monica Conway Birthdate: Dec 04, 1945 Sex: female Admission Date (Current Location): 10/25/2023  [facility] and [place] Number:  Producer, television/film/video and Address:  [facility],  [address] [place], [place] [address]      Provider Number: [id number]  Attending Physician Name and Address:  [place], Eric J, DO  Relative Name and Phone Number:  brother, [name] Clinch @ [phone]    Current Level of Care: Hospital Recommended Level of Care: Skilled Nursing Facility Prior Approval Number:    Date Approved/Denied:   PASRR Number: [id number]  Discharge Plan: SNF    Current Diagnoses: Patient Active Problem List   Diagnosis Date Noted   Lumbar compression fracture (HCC) 10/25/2023   History of stroke - 01-2023. felt to be due to afib. 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 coli UTI 02/05/2014    Orientation RESPIRATION BLADDER Height & Weight     Self, Time, Situation, Place  Normal Continent Weight: 120 lb 5.9 oz (54.6 kg) Height:  5' 2 (157.5 cm)  BEHAVIORAL SYMPTOMS/MOOD NEUROLOGICAL BOWEL NUTRITION STATUS      Continent Diet (regular)  AMBULATORY STATUS COMMUNICATION OF NEEDS Skin   Extensive Assist   Normal                       Personal Care Assistance Level of Assistance  Bathing, Dressing Bathing Assistance: Limited assistance   Dressing Assistance: Limited assistance     Functional Limitations Info  Sight, Hearing, Speech Sight Info: Adequate Hearing Info: Adequate Speech Info: Adequate    SPECIAL CARE  FACTORS FREQUENCY  PT (By licensed PT), OT (By licensed OT)     PT Frequency: 5x/wk OT Frequency: 5x/wk            Contractures Contractures Info: Not present    Additional Factors Info  Code Status, Allergies Code Status Info: DNR Allergies Info: Pine, Pollen Extract, Amlodipine -atorvastatin           Current Medications (10/26/2023):  This is the current hospital active medication list Current Facility-Administered Medications  Medication Dose Route Frequency Provider Last Rate Last Admin   albuterol  (PROVENTIL ) (2.5 MG/3ML) 0.083% nebulizer solution 2.5 mg  2.5 mg Nebulization Q2H PRN Zella, Mir M, MD       docusate sodium  (COLACE) capsule 100 mg  100 mg Oral BID Zella, Mir M, MD       enoxaparin  (LOVENOX ) injection 40 mg  40 mg Subcutaneous Q24H Zella, Mir M, MD   40 mg at 10/25/23 2228   ketorolac  (TORADOL ) 15 MG/ML injection 15 mg  15 mg Intravenous Q6H PRN [place], Eric J, DO       lidocaine  (LIDODERM ) 5 % 1 patch  1 patch Transdermal Q24H [place], Eric J, DO   1 patch at 10/26/23 9041   metoprolol  succinate (TOPROL -XL) 24 hr tablet 50 mg  50 mg Oral Daily Prosperi, Christian H, PA-C   50 mg at 10/26/23 9146   ondansetron  (ZOFRAN ) tablet 4 mg  4 mg Oral Q6H PRN Zella Katha HERO, MD  4 mg at 10/26/23 1121   Or   ondansetron  (ZOFRAN ) injection 4 mg  4 mg Intravenous Q6H PRN Zella, Mir M, MD       polyethylene glycol (MIRALAX  / GLYCOLAX ) packet 17 g  17 g Oral Daily PRN Zella, Mir M, MD       traMADol  (ULTRAM ) tablet 50 mg  50 mg Oral Q6H PRN Zella, Mir M, MD   50 mg at 10/26/23 1301   traZODone  (DESYREL ) tablet 25 mg  25 mg Oral QHS PRN Zella Katha HERO, MD         Discharge Medications: Please see discharge summary for a list of discharge medications.  Relevant Imaging Results:  Relevant Lab Results:   Additional Information SSN: [id number]  NORMAN ASPEN, LCSW

## 2023-10-26 NOTE — Plan of Care (Signed)
  Problem: Clinical Measurements: Goal: Respiratory complications will improve Outcome: Progressing Goal: Cardiovascular complication will be avoided Outcome: Progressing   Problem: Pain Managment: Goal: General experience of comfort will improve and/or be controlled Outcome: Progressing   Problem: Skin Integrity: Goal: Risk for impaired skin integrity will decrease Outcome: Progressing

## 2023-10-26 NOTE — TOC Initial Note (Addendum)
 Transition of Care [facility]) - Initial/Assessment Note    Patient Details  Name: Monica Conway MRN: [id number] Date of Birth: [birth year]  Transition of Care [facility]) CM/SW Contact:    NORMAN ASPEN, LCSW Phone Number: 10/26/2023, 1:59 PM  Clinical Narrative:      ADDENDUM: Bed offer received from [facility] and pt has accepted.  Have begun insurance authorization with [facility].  Pt and MD aware.              Met with pt today to review TOC/ CSW role with dc planning.  Pt lying in bed and c/o pain but able to engage and discuss dc recommendations per PT eval.  She confirms that she does live alone with intermittent support from brother.  She is frustrated with current functional limitations and pain and agrees that she is unable to manage independent in the her home. She is agreeable now with plan for SNF again.  Has been in SNFs x 2 in the past.  Prefers to return to [facility] if possible.  Will begin SNF work up / bed search.  Expected Discharge Plan: Skilled Nursing Facility Barriers to Discharge: Insurance Authorization, SNF Pending bed offer   Patient Goals and CMS Choice Patient states their goals for this hospitalization and ongoing recovery are:: return home following SNF rehab again          Expected Discharge Plan and Services In-house Referral: Clinical Social Work   Post Acute Care Choice: Skilled Nursing Facility Living arrangements for the past 2 months: Single Family Home                 DME Arranged: N/A DME Agency: NA                  Prior Living Arrangements/Services Living arrangements for the past 2 months: Single Family Home Lives with:: Self Patient language and need for interpreter reviewed:: Yes Do you feel safe going back to the place where you live?: Yes      Need for Family Participation in Patient Care: Yes (Comment) Care giver support system in place?: No (comment)   Criminal Activity/Legal Involvement Pertinent to Current  Situation/Hospitalization: No - Comment as needed  Activities of Daily Living   ADL Screening (condition at time of admission) Independently performs ADLs?: No Does the patient have a NEW difficulty with bathing/dressing/toileting/self-feeding that is expected to last >3 days?: Yes (Initiates electronic notice to provider for possible OT consult) Does the patient have a NEW difficulty with getting in/out of bed, walking, or climbing stairs that is expected to last >3 days?: Yes (Initiates electronic notice to provider for possible PT consult) Does the patient have a NEW difficulty with communication that is expected to last >3 days?: No Is the patient deaf or have difficulty hearing?: No Does the patient have difficulty seeing, even when wearing glasses/contacts?: No Does the patient have difficulty concentrating, remembering, or making decisions?: No  Permission Sought/Granted Permission sought to share information with : Family Supports, Magazine features editor Permission granted to share information with : Yes, Verbal Permission Granted  Share Information with NAME: brother, [name] Clinch @ [phone]  Permission granted to share info w AGENCY: SNFs        Emotional Assessment Appearance:: Appears stated age Attitude/Demeanor/Rapport: Engaged Affect (typically observed): Accepting Orientation: : Oriented to Self, Oriented to Place, Oriented to  Time, Oriented to Situation Alcohol  / Substance Use: Not Applicable Psych Involvement: No (comment)  Admission diagnosis:  Lumbar  compression fracture (HCC) [S32.000A] Compression fracture of lumbar vertebra, unspecified lumbar vertebral level, initial encounter [facility]) [S32.000A] Patient Active Problem List   Diagnosis Date Noted   Lumbar compression fracture (HCC) 10/25/2023   History of stroke - 01-2023. felt to be due to afib. 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 coli UTI 02/05/2014   PCP:  [facility]:   [id number] [address], [place] - [address] AT [facility] OF [facility] & [address] [place] [address] Phone: [phone] Fax: [phone]     Social Drivers of Health (SDOH) Social History: SDOH Screenings   Food Insecurity: No Food Insecurity (10/25/2023)  Housing: Low Risk  (10/25/2023)  Transportation Needs: No Transportation Needs (10/25/2023)  Utilities: Not At Risk (10/25/2023)  Social Connections: Moderately Integrated (10/25/2023)  Tobacco Use: Low Risk  (10/25/2023)   SDOH Interventions:     Readmission Risk Interventions    10/26/2023    1:57 PM 04/06/2023    8:44 AM  Readmission Risk Prevention Plan  Post Dischage Appt Complete   Medication Screening Complete   Transportation Screening Complete Complete  PCP or Specialist Appt within 3-5 Days  Complete  HRI or Home Care Consult  Complete  Social Work Consult for Recovery Care Planning/Counseling  Complete  Palliative Care Screening  Not Applicable  Medication Review Oceanographer)  Complete

## 2023-10-27 DIAGNOSIS — S32020A Wedge compression fracture of second lumbar vertebra, initial encounter for closed fracture: Secondary | ICD-10-CM | POA: Diagnosis not present

## 2023-10-27 LAB — BASIC METABOLIC PANEL WITH GFR
Anion gap: 12 (ref 5–15)
BUN: 11 mg/dL (ref 8–23)
CO2: 14 mmol/L — ABNORMAL LOW (ref 22–32)
Calcium: 8.2 mg/dL — ABNORMAL LOW (ref 8.9–10.3)
Chloride: 113 mmol/L — ABNORMAL HIGH (ref 98–111)
Creatinine, Ser: 0.85 mg/dL (ref 0.44–1.00)
GFR, Estimated: >60 mL/min (ref >60)
Glucose, Bld: 104 mg/dL — ABNORMAL HIGH (ref 70–99)
Potassium: 3.6 mmol/L (ref 3.5–5.1)
Sodium: 139 mmol/L (ref 135–145)

## 2023-10-27 LAB — MAGNESIUM: Magnesium: 1.5 mg/dL — ABNORMAL LOW (ref 1.7–2.4)

## 2023-10-27 MED ORDER — LIDOCAINE 5 % EX PTCH: 1.0000 | MEDICATED_PATCH | CUTANEOUS | Status: DC

## 2023-10-27 MED ORDER — POTASSIUM CHLORIDE CRYS ER 20 MEQ PO TBCR
40.0000 meq | EXTENDED_RELEASE_TABLET | Freq: Once | ORAL | Status: AC
  Administered 2023-10-27: 40 meq via ORAL
  Filled 2023-10-27: qty 2

## 2023-10-27 MED ORDER — MAGNESIUM SULFATE 2 GM/50ML IV SOLN
2.0000 g | Freq: Once | INTRAVENOUS | Status: AC
  Administered 2023-10-27: 2 g via INTRAVENOUS
  Filled 2023-10-27: qty 50

## 2023-10-27 MED ORDER — TRAMADOL HCL 50 MG PO TABS: 50.0000 mg | ORAL_TABLET | Freq: Four times a day (QID) | ORAL | 0 refills | Status: DC | PRN

## 2023-10-27 NOTE — Progress Notes (Signed)
 Physical Therapy Treatment Patient Details Name: Monica Conway MRN: [id number] DOB: 1945-12-24 Today's Date: 10/27/2023   History of Present Illness Monica Conway is a 78 y.o. female admitted 10/25/23 with intractable pain and inability to ambulate due to L5 compression fracture after he trunk lid of her car came down too fast and hit her on the top of the head, causing her to fall backwards onto her buttocks. PMH includes prior CVA, bilateral knee severe arthritis, hypertension, A-fib no longer on Eliquis  due to GI bleed    PT Comments  Pt progressing with mobility today, does not adhere to back precautions during  mobility despite education. Pain controlled during session. Pt amb 12'x2 with RW and min assist, requires incr time. Total assist with LB garment change, pt declined to attempt to assist self. Ox4 however noted STM deficits, diminished carryover from PT/OT sessions prior day d/c plan remains appropriate.    If plan is discharge home, recommend the following: Assistance with cooking/housework;Help with stairs or ramp for entrance;Assist for transportation;A lot of help with walking and/or transfers;A lot of help with bathing/dressing/bathroom   Can travel by private vehicle     No  Equipment Recommendations  None recommended by PT    Recommendations for Other Services       Precautions / Restrictions Precautions Precautions: Back Recall of Precautions/Restrictions: Impaired Precaution/Restrictions Comments: 0/3 back precautions recalled; reviewed again with pt Required Braces or Orthoses: Spinal Brace Spinal Brace: Lumbar corset;Applied in sitting position Restrictions Weight Bearing Restrictions Per Provider Order: No Other Position/Activity Restrictions: reviewed donning, pt will require reinforcement. pt total assist to don LSO. states it takes too long to put on (total time about <10 seconds for PT to don)     Mobility  Bed Mobility Overal bed mobility: Needs  Assistance Bed Mobility: Supine to Sit     Supine to sit: Contact guard     General bed mobility comments: educated in bed mobility for log roll technique, pt does not adhere to back precautions, sits herself up and progresses to EOB. no c/o pain    Transfers Overall transfer level: Needs assistance Equipment used: Rolling walker (2 wheels) Transfers: Sit to/from Stand Sit to Stand: Mod assist           General transfer comment: STS x2 from bed and BSC c/o  dizziness throughout session stating it is d/t her meds.  increased time for all transfers. cues for safetyand hand placement    Ambulation/Gait Ambulation/Gait assistance: Min assist Gait Distance (Feet): 12 Feet (x2) Assistive device: Rolling walker (2 wheels) Gait Pattern/deviations: Step-to pattern, Narrow base of support, Trunk flexed       General Gait Details: cues for RW position, trunk ext as able, assist to manage RW and steady throughout distance, intermittent posterior drift   Stairs             Wheelchair Mobility     Tilt Bed    Modified Rankin (Stroke Patients Only)       Balance   Sitting-balance support: Feet supported, Bilateral upper extremity supported, No upper extremity supported Sitting balance-Leahy Scale: Fair Sitting balance - Comments: unchallenged, initially min A support but quickly progressed to supervision   Standing balance support: Bilateral upper extremity supported, Reliant on assistive device for balance, During functional activity Standing balance-Leahy Scale: Poor Standing balance comment: reliant on device, intermittent external assist  Communication Communication Communication: Impaired Factors Affecting Communication: Hearing impaired  Cognition Arousal: Alert Behavior During Therapy: WFL for tasks assessed/performed, Anxious   PT - Cognitive impairments: No family/caregiver present to determine baseline, Memory,  Sequencing                         Following commands: Intact      Cueing Cueing Techniques: Verbal cues, Visual cues, Gestural cues  Exercises      General Comments        Pertinent Vitals/Pain Pain Assessment Pain Assessment: Faces Faces Pain Scale: Hurts little more Pain Location: back Pain Descriptors / Indicators: Discomfort Pain Intervention(s): Limited activity within patient's tolerance, Monitored during session, Repositioned    Home Living                          Prior Function            PT Goals (current goals can now be found in the care plan section) Acute Rehab PT Goals PT Goal Formulation: With patient Time For Goal Achievement: 11/09/23 Potential to Achieve Goals: Good Progress towards PT goals: Progressing toward goals    Frequency    Min 3X/week      PT Plan      Co-evaluation              AM-PAC PT 6 Clicks Mobility   Outcome Measure  Help needed turning from your back to your side while in a flat bed without using bedrails?: A Little Help needed moving from lying on your back to sitting on the side of a flat bed without using bedrails?: A Little Help needed moving to and from a bed to a chair (including a wheelchair)?: A Little Help needed standing up from a chair using your arms (e.g., wheelchair or bedside chair)?: A Little Help needed to walk in hospital room?: A Little Help needed climbing 3-5 steps with a railing? : A Lot 6 Click Score: 17    End of Session Equipment Utilized During Treatment: Gait belt;Other (comment) (LSO) Activity Tolerance: Patient limited by fatigue Patient left: in chair;with call bell/phone within reach;Other (comment) (SW) Nurse Communication: Mobility status PT Visit Diagnosis: Other abnormalities of gait and mobility (R26.89)     Time: 8970-8946 PT Time Calculation (min) (ACUTE ONLY): 24 min  Charges:    $Gait Training: 8-22 mins $Therapeutic Activity: 8-22  mins PT General Charges $$ ACUTE PT VISIT: 1 Visit                        [facility] 10/27/2023, 11:09 AM

## 2023-10-27 NOTE — Discharge Summary (Signed)
 Physician Discharge Summary  Monica Conway FMW:[id number] DOB: 1945-07-13 DOA: 10/25/2023  PCP: [facility]  Admit date: 10/25/2023 Discharge date: 10/27/2023  Admitted From: Home Disposition: [facility]  Recommendations for Outpatient Follow-up:  Follow up with PCP in 1-2 weeks Recommend repeat CMP 1 week for elevated transaminases, avoid hepatotoxins such as Tylenol   Discharge Condition: Stable CODE STATUS: DNR Diet recommendation: Heart healthy diet  History of present illness:  Monica Conway is a 78 y.o. female with past medical history significant for HTN, history of paroxysmal atrial fibrillation no longer on Eliquis  due to GI bleed, bilateral knee osteoarthritis who presented to [facility] ED on 10/25/2023 from home via EMS complaining of back pain, difficulty with ambulation.  Patient reports roughly 6 days prior to admission while she was shopping, her trunk lid came down too fast and hit her on the top of her head causing her to fall backwards onto her buttocks.  Denies loss of consciousness.  Did not strike her head 1 falling to the ground.  She was able to get up on her own accord and ambulate but since has had progressive pain now causing difficulty with ambulation.  She was seen day prior in the ED, x-rays notable for L2 compression fracture, given pain medication and was able to ambulate without significant pain and was discharged home in stable condition with plan outpatient follow-up with her PCP.  For follow return home, her symptoms recurred with severe low back pain; denies urinary continence, no issues with bowel/bladder.  Encephali called EMS and was taken to the ED for further evaluation.   In the ED, temperature 97.9 F, HR 89, RR 19, BP 182/104, SpO2 99% on room air.  WBC 6.5, hemoglobin 14.0, platelet count 376.  Sodium 139, potassium 3.1, chloride 109, CO2 15, glucose 102, BUN 18, creat 1.00.  AST 69, ALT 52, total bilirubin 0.7.  Urinalysis with small  leukocytes, positive nitrite, many bacteria, 11-20 WBCs.  Hospital course:  L2 compression fracture, acute Intractable pain Patient presenting to the ED with progressive/intractable pain now with difficulty ambulating.  Sustained mechanical fall roughly 6 days prior to admission.  Imaging notable for L2 compression fracture, more severe left than right without significant retropulsed bone, remote left parasymphyseal pubic rami fractures.  Continue pain control with lidocaine  patch, tramadol .  Seen by PT/OT with the recommendation of SNF placement.  Discharging to rehab.   Elevated LFTs Patient presenting with LFT elevation with ALT 69, ALT 52, normal total bilirubin.  Acute hepatitis panel negative.  Avoid hepatotoxins (Tylenol ).  Recommend repeat CMP 1 week   Hypokalemia Repleted during hospitalization.   Abnormal urinalysis Urinalysis with small leukocytes, positive nitrite, many bacteria, 11-20WBCs.  Patient denies any urinary symptoms.  Received 1 dose of IV ceftriaxone  in the ED. no further antibiotics indicated given patient asymptomatic.   HTN Paroxysmal atrial fibrillation not on anticoagulation Patient no longer on Eliquis  due to history of GI bleed. Metoprolol  succinate 50 mg p.o. daily, amlodipine  5 mg p.o. daily.  Discharge Diagnoses:  Principal Problem:   Lumbar compression fracture [facility])    Discharge Instructions  Discharge Instructions     Call MD for:  difficulty breathing, headache or visual disturbances   Complete by: As directed    Call MD for:  extreme fatigue   Complete by: As directed    Call MD for:  persistant dizziness or light-headedness   Complete by: As directed    Call MD for:  persistant nausea and  vomiting   Complete by: As directed    Call MD for:  severe uncontrolled pain   Complete by: As directed    Call MD for:  temperature >100.4   Complete by: As directed    Diet - low sodium heart healthy   Complete by: As directed    Increase  activity slowly   Complete by: As directed       Allergies as of 10/27/2023       Reactions   Pine Shortness Of Breath   Pollen Extract Shortness Of Breath   Amlodipine -atorvastatin Other (See Comments)   Caduet is the name brand        Medication List     STOP taking these medications    acetaminophen  500 MG tablet Commonly known as: TYLENOL    docusate sodium  100 MG capsule Commonly known as: COLACE   oxyCODONE  5 MG immediate release tablet Commonly known as: Oxy IR/ROXICODONE    potassium chloride  20 MEQ packet Commonly known as: KLOR-CON        TAKE these medications    amLODipine  5 MG tablet Commonly known as: NORVASC  Take 1 tablet (5 mg total) by mouth daily. What changed: when to take this   iron  polysaccharides 150 MG capsule Commonly known as: Nu-Iron  Take 1 capsule (150 mg total) by mouth daily.   lidocaine  5 % Commonly known as: LIDODERM  Place 1 patch onto the skin daily. Remove & Discard patch within 12 hours or as directed by MD Start taking on: October 28, 2023   loperamide  2 MG capsule Commonly known as: IMODIUM  Take 1 capsule (2 mg total) by mouth daily as needed for diarrhea or loose stools.   melatonin 5 MG Tabs Take 1 tablet (5 mg total) by mouth at bedtime.   metoprolol  succinate 50 MG 24 hr tablet Commonly known as: TOPROL -XL Take 1 tablet (50 mg total) by mouth daily.   Myrbetriq 25 MG Tb24 tablet Generic drug: mirabegron ER   pantoprazole  40 MG tablet Commonly known as: PROTONIX  Take 1 tablet (40 mg total) by mouth 2 (two) times daily.   QUEtiapine  25 MG tablet Commonly known as: SEROQUEL  Take 1 tablet (25 mg total) by mouth 2 (two) times daily.   traMADol  50 MG tablet Commonly known as: ULTRAM  Take 1 tablet (50 mg total) by mouth every 6 (six) hours as needed for moderate pain (pain score 4-6). What changed:  when to take this reasons to take this   Ventolin  HFA 108 (90 Base) MCG/ACT inhaler Generic drug:  albuterol  Inhale 1 puff into the lungs See admin instructions. Inhale 1 puff into the lungs ONE TO FOUR TIMES a day as needed for shortness of breath or wheezing        Follow-up Information     [facility] Schedule an appointment as soon as possible for a visit in 1 week(s).   Contact information: [address] [place] [address] [phone]                Allergies  Allergen Reactions   Pine Shortness Of Breath   Pollen Extract Shortness Of Breath   Amlodipine -Atorvastatin Other (See Comments)    Caduet is the name brand    Consultations: None   Procedures/Studies: DG Hips Bilat W or Wo Pelvis 3-4 Views Result Date: 10/24/2023 EXAM: 5 VIEW(S) XRAY OF THE BILATERAL HIPS 10/24/2023 11:06:00 AM COMPARISON: None available. CLINICAL HISTORY: Fall, pain. Per chart: Per EMS from home. Unwitnessed fall last Sunday. No blood  thinners. C/o pain, decrease in mobility, lower back and right hip pain over past week. 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 Remote left parasymphyseal pubic ramus fracture. Degenerative changes of the lower lumbar spine. SOFT TISSUES: Vascular calcifications. IMPRESSION: 1. No acute fracture or dislocation. 2. 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 3. Remote left parasymphyseal pubic ramus fracture. Electronically signed by: Lonni Necessary MD 10/24/2023 11:32 AM EDT RP Workstation: [id number]   DG Sacrum/Coccyx Result Date: 10/24/2023 EXAM: 3 VIEW(S) XRAY OF THE SACRUM AND COCCYX 10/24/2023 11:06:00 AM COMPARISON: None available. CLINICAL HISTORY: Fall, pain. Per chart: Per EMS from home. Unwitnessed fall last Sunday. No blood thinners. C/o pain, decrease in mobility, lower back and right hip pain over past week. FINDINGS: BONES AND JOINTS: Left total hip arthroplasty noted. Healing left parasymphyseal pubic rami fracture is noted. No acute fracture. No joint dislocation. SOFT TISSUES: The soft tissues are  unremarkable. IMPRESSION: 1. Healing left parasymphyseal pubic rami fracture. 2. No acute findings. Electronically signed by: Lonni Necessary MD 10/24/2023 11:30 AM EDT RP Workstation: [id number]   DG Lumbar Spine Complete Result Date: 10/24/2023 EXAM: 4 VIEW(S) XRAY OF THE LUMBAR SPINE 10/24/2023 11:06:00 AM COMPARISON: CTA abdomen and pelvis 04/03/2023. CLINICAL HISTORY: Fall, pain. Per chart: Per EMS from home. Unwitnessed fall last Sunday. No blood thinners. C/o pain, decrease in mobility, lower back and right hip pain over past week. FINDINGS: LUMBAR SPINE: BONES: A new L2 compression fracture is more severe left than right. No significant retropulsed bone is present. Vertebral body heights are otherwise maintained. Left parasymphyseal pubic rami fractures are again noted. DISCS AND DEGENERATIVE CHANGES: No severe degenerative changes. SOFT TISSUES: No acute abnormality. VASCULATURE: Atherosclerotic changes are present in the aorta. No aneurysm is present. JOINTS: Left total hip arthroplasty. IMPRESSION: 1. New L2 compression fracture, more severe left than right, without significant retropulsed bone. 2. Remote left parasymphyseal pubic rami fractures. Electronically signed by: Lonni Necessary MD 10/24/2023 11:27 AM EDT RP Workstation: [id number]   MM 3D SCREENING MAMMOGRAM BILATERAL BREAST Result Date: 10/01/2023 CLINICAL DATA:  Screening. EXAM: DIGITAL SCREENING BILATERAL MAMMOGRAM WITH TOMOSYNTHESIS AND CAD TECHNIQUE: Bilateral screening digital craniocaudal and mediolateral oblique mammograms were obtained. Bilateral screening digital breast tomosynthesis was performed. The images were evaluated with computer-aided detection. COMPARISON:  Previous exam(s). ACR Breast Density Category b: There are scattered areas of fibroglandular density. FINDINGS: There are no findings suspicious for malignancy. IMPRESSION: No mammographic evidence of malignancy. A result letter of this screening mammogram  will be mailed directly to the patient. RECOMMENDATION: Screening mammogram in one year. (Code:SM-B-01Y) BI-RADS CATEGORY  1: Negative. Electronically Signed   By: Reyes Phi M.D.   On: 10/01/2023 12:33     Subjective: Patient seen examined bedside, lying in bed.  NT present at bedside.  States wants her stool softeners discontinued.  Reports pain much better controlled.  Discharging to SNF today.  No other specific questions, concerns or complaints at this time.  Denies headache, no dizziness, no chest pain, no palpitations, no shortness of breath, no abdominal pain, no fever/chills/night sweats, no nausea/vomiting/diarrhea, no focal weakness, no fatigue, no paresthesias.  No acute events overnight per nursing staff.  Discharge Exam: Vitals:   10/26/23 2221 10/27/23 0538  BP: (!) 137/95 (!) 147/63  Pulse: 69 64  Resp: 20 18  Temp: 97.6 F (36.4 C) 98 F (36.7 C)  SpO2: 97% 97%   Vitals:   10/26/23 0623 10/26/23 1315 10/26/23 2221 10/27/23 0538  BP: (!) 148/80  136/74 (!) 137/95 (!) 147/63  Pulse: 75 60 69 64  Resp: 17 19 20 18   Temp: 98.3 F (36.8 C) 97.7 F (36.5 C) 97.6 F (36.4 C) 98 F (36.7 C)  TempSrc: Oral  Oral Oral  SpO2: 96% 100% 97% 97%  Weight:      Height:        Physical Exam: GEN: NAD, alert and oriented x 3, elderly in appearance HEENT: NCAT, PERRL, EOMI, sclera clear, MMM, ecchymosis surrounding left forehead/orbit PULM: CTAB w/o wheezes/crackles, normal respiratory effort, on room air CV: RRR w/o M/G/R GI: abd soft, NTND, + BS MSK: no peripheral edema, muscle strength globally intact 5/5 bilateral upper/lower extremities, + TTP mid/lower back midline NEURO: CN II-XII intact, no focal deficits, sensation to light touch intact PSYCH: normal mood/affect Integumentary: Left forehead/orbit ecchymosis as above, otherwise no other concerning rashes/lesions/wounds noted on exposed skin surfaces     The results of significant diagnostics from this  hospitalization (including imaging, microbiology, ancillary and laboratory) are listed below for reference.     Microbiology: Recent Results (from the past 240 hours)  Urine Culture     Status: Abnormal (Preliminary result)   Collection Time: 10/25/23  1:50 PM   Specimen: Urine, Clean Catch  Result Value Ref Range Status   Specimen Description   Final    URINE, CLEAN CATCH Performed at [facility], [address], [place], [place] [address]    Special Requests   Final    NONE Performed at [facility], [address] [place], [place] [address]    Culture (A)  Final    >=100,000 COLONIES/mL CITROBACTER AMALONATICUS SUSCEPTIBILITIES TO FOLLOW Performed at [facility], [address] [place], [place] [address]    Report Status PENDING  Incomplete     Labs: BNP (last 3 results) No results for input(s): BNP in the last 8760 hours. Basic Metabolic Panel: Recent Labs  Lab 10/25/23 1019 10/26/23 0325 10/27/23 0325  NA 139 139 139  K 3.1* 3.3* 3.6  CL 109 111 113*  CO2 15* 15* 14*  GLUCOSE 102* 95 104*  BUN 18 18 11   CREATININE 1.00 0.83 0.85  CALCIUM  8.9 8.3* 8.2*  MG  --   --  1.5*   Liver Function Tests: Recent Labs  Lab 10/25/23 1019 10/26/23 0325  AST 69* 98*  ALT 52* 58*  ALKPHOS 190* 176*  BILITOT 0.7 0.4  PROT 7.3 6.3*  ALBUMIN 3.4* 3.1*   Recent Labs  Lab 10/25/23 1019  LIPASE 60*   No results for input(s): AMMONIA in the last 168 hours. CBC: Recent Labs  Lab 10/25/23 1019 10/26/23 0325  WBC 6.5 5.7  HGB 14.0 13.2  HCT 43.7 41.6  MCV 96.5 96.1  PLT 376 399   Cardiac Enzymes: No results for input(s): CKTOTAL, CKMB, CKMBINDEX, TROPONINI in the last 168 hours. BNP: Invalid input(s): POCBNP CBG: No results for input(s): GLUCAP in the last 168 hours. D-Dimer No results for input(s): DDIMER in the last 72 hours. Hgb A1c No results for input(s): HGBA1C in the last 72  hours. Lipid Profile No results for input(s): CHOL, HDL, LDLCALC, TRIG, CHOLHDL, LDLDIRECT in the last 72 hours. Thyroid function studies No results for input(s): TSH, T4TOTAL, T3FREE, THYROIDAB in the last 72 hours.  Invalid input(s): FREET3 Anemia work up No results for input(s): VITAMINB12, FOLATE, FERRITIN, TIBC, IRON , RETICCTPCT in the last 72 hours. Urinalysis    Component Value Date/Time   COLORURINE  YELLOW 10/25/2023 1157   APPEARANCEUR HAZY (A) 10/25/2023 1157   LABSPEC 1.011 10/25/2023 1157   PHURINE 6.0 10/25/2023 1157   GLUCOSEU NEGATIVE 10/25/2023 1157   HGBUR SMALL (A) 10/25/2023 1157   BILIRUBINUR NEGATIVE 10/25/2023 1157   KETONESUR NEGATIVE 10/25/2023 1157   PROTEINUR 100 (A) 10/25/2023 1157   UROBILINOGEN 0.2 02/04/2014 2020   NITRITE POSITIVE (A) 10/25/2023 1157   LEUKOCYTESUR SMALL (A) 10/25/2023 1157   Sepsis Labs Recent Labs  Lab 10/25/23 1019 10/26/23 0325  WBC 6.5 5.7   Microbiology Recent Results (from the past 240 hours)  Urine Culture     Status: Abnormal (Preliminary result)   Collection Time: 10/25/23  1:50 PM   Specimen: Urine, Clean Catch  Result Value Ref Range Status   Specimen Description   Final    URINE, CLEAN CATCH Performed at [facility], [address], [place], [place] [address]    Special Requests   Final    NONE Performed at [facility], [address] [place], [place] [address]    Culture (A)  Final    >=100,000 COLONIES/mL CITROBACTER AMALONATICUS SUSCEPTIBILITIES TO FOLLOW Performed at [facility], [address], [place], [place] [address]    Report Status PENDING  Incomplete     Time coordinating discharge: Over 30 minutes  SIGNED:   Camellia PARAS [place], DO  [facility] 10/27/2023, 9:43 AM

## 2023-10-27 NOTE — Care Management CC44 (Signed)
 Condition Code 44 Documentation Completed  Patient Details  Name: Monica Conway MRN: [id number] Date of Birth: [birth year]   Condition Code 44 given:  Yes Patient signature on Condition Code 44 notice:   (pt declined to sign) Documentation of 2 MD's agreement:  Yes Code 44 added to claim:  Yes    Kaiyon Hynes, LCSW 10/27/2023, 9:23 AM

## 2023-10-27 NOTE — Plan of Care (Signed)
  Problem: Safety: Goal: Ability to remain free from injury will improve Outcome: Progressing   Problem: Pain Managment: Goal: General experience of comfort will improve and/or be controlled Outcome: Progressing   Problem: Elimination: Goal: Will not experience complications related to urinary retention Outcome: Progressing

## 2023-10-27 NOTE — TOC Transition Note (Addendum)
 Transition of Care [facility]) - Discharge Note   Patient Details  Name: Monica Conway MRN: [id number] Date of Birth: 08-12-1945  Transition of Care [facility]) CM/SW Contact:  Sheri ONEIDA Sharps, LCSW Phone Number: 10/27/2023, 11:25 AM   Clinical Narrative:    Pt medically ready to dc to [facility]. Call report [phone] room 602p given to nurse. DC packet w/ signed DNR and scripts left at nurses station. PTAR called at 11am. No further TOC needs.   Final next level of care: Skilled Nursing Facility Barriers to Discharge: Barriers Resolved   Patient Goals and CMS Choice Patient states their goals for this hospitalization and ongoing recovery are:: return home following SNF   Choice offered to / list presented to : Patient [place] ownership interest in [facility].provided to:: Patient    Discharge Placement PASRR number recieved: 10/26/23            Patient chooses bed at: [facility] Patient to be transferred to facility by: PTAR Name of family member notified: Alfredia Charleston [phone] Patient and family notified of of transfer: 10/27/23  Discharge Plan and Services Additional resources added to the After Visit Summary for   In-house Referral: Clinical Social Work   Post Acute Care Choice: Skilled Nursing Facility          DME Arranged: N/A DME Agency: NA       HH Arranged: NA HH Agency: NA        Social Drivers of Health (SDOH) Interventions SDOH Screenings   Food Insecurity: No Food Insecurity (10/25/2023)  Housing: Low Risk  (10/25/2023)  Transportation Needs: No Transportation Needs (10/25/2023)  Utilities: Not At Risk (10/25/2023)  Social Connections: Moderately Integrated (10/25/2023)  Tobacco Use: Low Risk  (10/25/2023)     Readmission Risk Interventions    10/26/2023    1:57 PM 04/06/2023    8:44 AM  Readmission Risk Prevention Plan  Post Dischage Appt Complete   Medication Screening Complete   Transportation Screening Complete Complete  PCP or  Specialist Appt within 3-5 Days  Complete  HRI or Home Care Consult  Complete  Social Work Consult for Recovery Care Planning/Counseling  Complete  Palliative Care Screening  Not Applicable  Medication Review Ocean[employer])  Complete

## 2023-10-27 NOTE — Care Management Obs Status (Signed)
 MEDICARE OBSERVATION STATUS NOTIFICATION   Patient Details  Name: Monica Conway MRN: [id number] Date of Birth: 08-21-1945   Medicare Observation Status Notification Given:  Yes  Patient declined to sign.  Yasheka Fossett, LCSW 10/27/2023, 9:23 AM

## 2023-10-28 LAB — URINE CULTURE: Culture: >=100000 — AB

## 2023-12-18 ENCOUNTER — Other Ambulatory Visit: Payer: Self-pay

## 2023-12-18 ENCOUNTER — Emergency Department (HOSPITAL_COMMUNITY)

## 2023-12-18 ENCOUNTER — Inpatient Hospital Stay (HOSPITAL_COMMUNITY)
Admission: EM | Admit: 2023-12-18 | Discharge: 2023-12-29 | DRG: 871 | Disposition: A | Discharge status: Skilled Nursing Facility | Attending: Internal Medicine | Admitting: Internal Medicine

## 2023-12-18 DIAGNOSIS — N390 Urinary tract infection, site not specified: Secondary | ICD-10-CM | POA: Diagnosis present

## 2023-12-18 DIAGNOSIS — B964 Proteus (mirabilis) (morganii) as the cause of diseases classified elsewhere: Secondary | ICD-10-CM | POA: Diagnosis present

## 2023-12-18 DIAGNOSIS — Z96642 Presence of left artificial hip joint: Secondary | ICD-10-CM | POA: Diagnosis present

## 2023-12-18 DIAGNOSIS — I480 Paroxysmal atrial fibrillation: Secondary | ICD-10-CM | POA: Diagnosis present

## 2023-12-18 DIAGNOSIS — Z751 Person awaiting admission to adequate facility elsewhere: Secondary | ICD-10-CM

## 2023-12-18 DIAGNOSIS — M810 Age-related osteoporosis without current pathological fracture: Secondary | ICD-10-CM | POA: Diagnosis present

## 2023-12-18 DIAGNOSIS — R6520 Severe sepsis without septic shock: Secondary | ICD-10-CM | POA: Diagnosis present

## 2023-12-18 DIAGNOSIS — Z79899 Other long term (current) drug therapy: Secondary | ICD-10-CM | POA: Diagnosis not present

## 2023-12-18 DIAGNOSIS — R410 Disorientation, unspecified: Secondary | ICD-10-CM | POA: Diagnosis present

## 2023-12-18 DIAGNOSIS — Z888 Allergy status to other drugs, medicaments and biological substances status: Secondary | ICD-10-CM | POA: Diagnosis not present

## 2023-12-18 DIAGNOSIS — Z8673 Personal history of transient ischemic attack (TIA), and cerebral infarction without residual deficits: Secondary | ICD-10-CM

## 2023-12-18 DIAGNOSIS — G9341 Metabolic encephalopathy: Secondary | ICD-10-CM | POA: Diagnosis present

## 2023-12-18 DIAGNOSIS — Z8249 Family history of ischemic heart disease and other diseases of the circulatory system: Secondary | ICD-10-CM | POA: Diagnosis not present

## 2023-12-18 DIAGNOSIS — E872 Acidosis, unspecified: Secondary | ICD-10-CM | POA: Diagnosis present

## 2023-12-18 DIAGNOSIS — A419 Sepsis, unspecified organism: Secondary | ICD-10-CM | POA: Diagnosis present

## 2023-12-18 DIAGNOSIS — Z91048 Other nonmedicinal substance allergy status: Secondary | ICD-10-CM | POA: Diagnosis not present

## 2023-12-18 DIAGNOSIS — Z66 Do not resuscitate: Secondary | ICD-10-CM | POA: Diagnosis present

## 2023-12-18 DIAGNOSIS — F0390 Unspecified dementia without behavioral disturbance: Secondary | ICD-10-CM | POA: Diagnosis present

## 2023-12-18 DIAGNOSIS — E876 Hypokalemia: Secondary | ICD-10-CM | POA: Diagnosis present

## 2023-12-18 DIAGNOSIS — I10 Essential (primary) hypertension: Secondary | ICD-10-CM | POA: Diagnosis present

## 2023-12-18 DIAGNOSIS — R4189 Other symptoms and signs involving cognitive functions and awareness: Secondary | ICD-10-CM | POA: Insufficient documentation

## 2023-12-18 DIAGNOSIS — N179 Acute kidney failure, unspecified: Secondary | ICD-10-CM | POA: Diagnosis present

## 2023-12-18 DIAGNOSIS — N3000 Acute cystitis without hematuria: Secondary | ICD-10-CM

## 2023-12-18 DIAGNOSIS — R4182 Altered mental status, unspecified: Principal | ICD-10-CM

## 2023-12-18 DIAGNOSIS — E869 Volume depletion, unspecified: Secondary | ICD-10-CM | POA: Diagnosis present

## 2023-12-18 DIAGNOSIS — B9620 Unspecified Escherichia coli [E. coli] as the cause of diseases classified elsewhere: Secondary | ICD-10-CM | POA: Diagnosis present

## 2023-12-18 DIAGNOSIS — I4891 Unspecified atrial fibrillation: Secondary | ICD-10-CM

## 2023-12-18 LAB — COMPREHENSIVE METABOLIC PANEL WITH GFR
ALT: 32 U/L (ref 0–44)
AST: 75 U/L — ABNORMAL HIGH (ref 15–41)
Albumin: 3.1 g/dL — ABNORMAL LOW (ref 3.5–5.0)
Alkaline Phosphatase: 223 U/L — ABNORMAL HIGH (ref 38–126)
Anion gap: 17 — ABNORMAL HIGH (ref 5–15)
BUN: 39 mg/dL — ABNORMAL HIGH (ref 8–23)
CO2: 12 mmol/L — ABNORMAL LOW (ref 22–32)
Calcium: 8.6 mg/dL — ABNORMAL LOW (ref 8.9–10.3)
Chloride: 110 mmol/L (ref 98–111)
Creatinine, Ser: 1.44 mg/dL — ABNORMAL HIGH (ref 0.44–1.00)
GFR, Estimated: 37 mL/min — ABNORMAL LOW (ref >60)
Glucose, Bld: 60 mg/dL — ABNORMAL LOW (ref 70–99)
Potassium: 3.6 mmol/L (ref 3.5–5.1)
Sodium: 139 mmol/L (ref 135–145)
Total Bilirubin: 0.7 mg/dL (ref 0.0–1.2)
Total Protein: 6.9 g/dL (ref 6.5–8.1)

## 2023-12-18 LAB — CBC WITH DIFFERENTIAL/PLATELET
Abs Immature Granulocytes: 0.32 K/uL — ABNORMAL HIGH (ref 0.00–0.07)
Basophils Absolute: 0 K/uL (ref 0.0–0.1)
Basophils Relative: 0 %
Eosinophils Absolute: 0 K/uL (ref 0.0–0.5)
Eosinophils Relative: 0 %
HCT: 43.1 % (ref 36.0–46.0)
Hemoglobin: 13.6 g/dL (ref 12.0–15.0)
Immature Granulocytes: 2 %
Lymphocytes Relative: 7 %
Lymphs Abs: 1.2 K/uL (ref 0.7–4.0)
MCH: 30.4 pg (ref 26.0–34.0)
MCHC: 31.6 g/dL (ref 30.0–36.0)
MCV: 96.4 fL (ref 80.0–100.0)
Monocytes Absolute: 0.9 K/uL (ref 0.1–1.0)
Monocytes Relative: 6 %
Neutro Abs: 13.6 K/uL — ABNORMAL HIGH (ref 1.7–7.7)
Neutrophils Relative %: 85 %
Platelets: 336 K/uL (ref 150–400)
RBC: 4.47 MIL/uL (ref 3.87–5.11)
RDW: 18.6 % — ABNORMAL HIGH (ref 11.5–15.5)
WBC: 16 K/uL — ABNORMAL HIGH (ref 4.0–10.5)
nRBC: 1.1 % — ABNORMAL HIGH (ref 0.0–0.2)

## 2023-12-18 LAB — URINALYSIS, W/ REFLEX TO CULTURE (INFECTION SUSPECTED)
Bilirubin Urine: NEGATIVE
Glucose, UA: NEGATIVE mg/dL
Ketones, ur: 5 mg/dL — AB
Nitrite: NEGATIVE
Protein, ur: 100 mg/dL — AB
Specific Gravity, Urine: 1.014 (ref 1.005–1.030)
WBC, UA: >50 WBC/hpf (ref 0–5)
pH: 8 (ref 5.0–8.0)

## 2023-12-18 LAB — PROTIME-INR
INR: 1.2 (ref 0.8–1.2)
Prothrombin Time: 15.5 s — ABNORMAL HIGH (ref 11.4–15.2)

## 2023-12-18 LAB — I-STAT CG4 LACTIC ACID, ED
Lactic Acid, Venous: 1.2 mmol/L (ref 0.5–1.9)
Lactic Acid, Venous: 1 mmol/L (ref 0.5–1.9)

## 2023-12-18 MED ORDER — QUETIAPINE FUMARATE 25 MG PO TABS
25.0000 mg | ORAL_TABLET | Freq: Two times a day (BID) | ORAL | Status: DC
  Administered 2023-12-18 – 2023-12-26 (×16): 25 mg via ORAL
  Filled 2023-12-18 (×17): qty 1

## 2023-12-18 MED ORDER — SODIUM CHLORIDE 0.9 % IV BOLUS
1000.0000 mL | Freq: Once | INTRAVENOUS | Status: AC
  Administered 2023-12-18: 1000 mL via INTRAVENOUS

## 2023-12-18 MED ORDER — SODIUM CHLORIDE 0.9 % IV SOLN
1.0000 g | Freq: Once | INTRAVENOUS | Status: AC
  Administered 2023-12-18: 1 g via INTRAVENOUS
  Filled 2023-12-18: qty 10

## 2023-12-18 MED ORDER — ALBUTEROL SULFATE (2.5 MG/3ML) 0.083% IN NEBU: 2.5000 mg | INHALATION_SOLUTION | RESPIRATORY_TRACT | Status: DC | PRN

## 2023-12-18 MED ORDER — ACETAMINOPHEN 650 MG RE SUPP: 650.0000 mg | Freq: Four times a day (QID) | RECTAL | Status: DC | PRN

## 2023-12-18 MED ORDER — MIRABEGRON ER 25 MG PO TB24
25.0000 mg | ORAL_TABLET | Freq: Every day | ORAL | Status: DC
Start: 2023-12-18 — End: 2023-12-29
  Administered 2023-12-18 – 2023-12-29 (×12): 25 mg via ORAL
  Filled 2023-12-18 (×12): qty 1

## 2023-12-18 MED ORDER — METOPROLOL SUCCINATE ER 25 MG PO TB24
25.0000 mg | ORAL_TABLET | Freq: Every day | ORAL | Status: DC
  Administered 2023-12-18 – 2023-12-20 (×3): 25 mg via ORAL
  Filled 2023-12-18 (×3): qty 1

## 2023-12-18 MED ORDER — DILTIAZEM LOAD VIA INFUSION
10.0000 mg | Freq: Once | INTRAVENOUS | Status: AC
  Administered 2023-12-18: 10 mg via INTRAVENOUS
  Filled 2023-12-18: qty 10

## 2023-12-18 MED ORDER — ONDANSETRON HCL 4 MG PO TABS: 4.0000 mg | ORAL_TABLET | Freq: Four times a day (QID) | ORAL | Status: DC | PRN

## 2023-12-18 MED ORDER — PANTOPRAZOLE SODIUM 40 MG PO TBEC
40.0000 mg | DELAYED_RELEASE_TABLET | Freq: Two times a day (BID) | ORAL | Status: DC
  Administered 2023-12-18 – 2023-12-29 (×22): 40 mg via ORAL
  Filled 2023-12-18 (×22): qty 1

## 2023-12-18 MED ORDER — MORPHINE SULFATE (PF) 2 MG/ML IV SOLN
2.0000 mg | Freq: Once | INTRAVENOUS | Status: AC
  Administered 2023-12-18: 2 mg via INTRAVENOUS
  Filled 2023-12-18: qty 1

## 2023-12-18 MED ORDER — FENTANYL CITRATE (PF) 50 MCG/ML IJ SOSY
25.0000 ug | PREFILLED_SYRINGE | Freq: Once | INTRAMUSCULAR | Status: AC
  Administered 2023-12-18: 25 ug via INTRAVENOUS
  Filled 2023-12-18: qty 1

## 2023-12-18 MED ORDER — ACETAMINOPHEN 325 MG PO TABS
650.0000 mg | ORAL_TABLET | Freq: Four times a day (QID) | ORAL | Status: DC | PRN
  Administered 2023-12-18 – 2023-12-28 (×12): 650 mg via ORAL
  Filled 2023-12-18 (×13): qty 2

## 2023-12-18 MED ORDER — SODIUM CHLORIDE 0.9 % IV SOLN
1.0000 g | INTRAVENOUS | Status: DC
  Administered 2023-12-19 – 2023-12-20 (×2): 1 g via INTRAVENOUS
  Filled 2023-12-18 (×2): qty 10

## 2023-12-18 MED ORDER — ONDANSETRON HCL 4 MG/2ML IJ SOLN
4.0000 mg | Freq: Four times a day (QID) | INTRAMUSCULAR | Status: DC | PRN
  Administered 2023-12-27: 4 mg via INTRAVENOUS
  Filled 2023-12-18: qty 2

## 2023-12-18 MED ORDER — HEPARIN SODIUM (PORCINE) 5000 UNIT/ML IJ SOLN
5000.0000 [IU] | Freq: Three times a day (TID) | INTRAMUSCULAR | Status: DC
  Administered 2023-12-18 – 2023-12-29 (×32): 5000 [IU] via SUBCUTANEOUS
  Filled 2023-12-18 (×31): qty 1

## 2023-12-18 MED ORDER — ENOXAPARIN SODIUM 40 MG/0.4ML IJ SOSY: 40.0000 mg | PREFILLED_SYRINGE | INTRAMUSCULAR | Status: DC

## 2023-12-18 MED ORDER — DILTIAZEM HCL-DEXTROSE 125-5 MG/125ML-% IV SOLN (PREMIX)
5.0000 mg/h | INTRAVENOUS | Status: DC
  Administered 2023-12-18 – 2023-12-19 (×2): 5 mg/h via INTRAVENOUS
  Filled 2023-12-18 (×3): qty 125

## 2023-12-18 NOTE — ED Triage Notes (Signed)
 Pt BIBA from home for confusion. Caretakers left last night. Pt called 911 saying she was being kidnapped in her own house. Per son had UTI a few weeks ago and got abx. No hx dementia. C/o hip and pelvis pain from fx a few weeks ago.

## 2023-12-18 NOTE — H&P (Signed)
 History and Physical  Monica Conway FMW:[id number] DOB: 1945-05-28 DOA: 12/18/2023  PCP: [facility]   Chief Complaint: Confusion  HPI: Monica Conway is a 78 y.o. female with medical history significant for A-fib not on anticoagulation, hypertension, prior CVA being admitted to the hospital with sepsis due to UTI complicated by A-fib with RVR.  Patient lives at home with assistance of caregivers, who left her last night as they usually do.  She called 911 overnight saying that she was being kidnapped in her own house.  Workup in the emergency department reveals the patient is in A-fib with RVR, and has evidence of UTI.  She was started on IV Cardizem  drip, as well as empiric IV antibiotics and hospitalist admission was requested.  Patient is currently confused, unable to provide review of systems.  Review of Systems: Please see HPI for pertinent positives and negatives.    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/26/2023   Closed left hip fracture (HCC) 08/03/2015   Hypertension    Osteoporosis    Past Surgical History:  Procedure Laterality Date   COLON SURGERY     ESOPHAGOGASTRODUODENOSCOPY N/A 04/04/2023   Procedure: ESOPHAGOGASTRODUODENOSCOPY (EGD);  Surgeon: Rosalie Kitchens, MD;  Location: [facility];  Service: Gastroenterology;  Laterality: N/A;   TOTAL HIP ARTHROPLASTY Left 08/03/2015   Procedure: TOTAL LEFT HIP ARTHROPLASTY ANTERIOR APPROACH;  Surgeon: Redell Shoals, MD;  Location: [facility];  Service: Orthopedics;  Laterality: Left;   TUBAL LIGATION     Social History:  reports that she has never smoked. She has never used smokeless tobacco. She reports that she does not drink alcohol  and does not use drugs.  Allergies  Allergen Reactions   Pine Shortness Of Breath   Pollen  Extract Shortness Of Breath   Caduet [Amlodipine -Atorvastatin] Other (See Comments)    Unknown reaction    Family History  Problem Relation Age of Onset   Cataracts Sister    Hypertension Sister    Hypertension Brother      Prior to Admission medications   Medication Sig Start Date End Date Taking? Authorizing Provider  albuterol  (VENTOLIN  HFA) 108 (90 Base) MCG/ACT inhaler Inhale 2 puffs into the lungs every 6 (six) hours as needed for wheezing or shortness of breath.   Yes [provider]  metoprolol  succinate (TOPROL -XL) 25 MG 24 hr tablet Take 25 mg by mouth daily.   Yes [provider]  MYRBETRIQ 25 MG TB24 tablet Take 25 mg by mouth daily. 09/29/23  Yes [provider]  pantoprazole  (PROTONIX ) 40 MG tablet Take 1 tablet (40 mg total) by mouth 2 (two) times daily. 04/09/23 04/08/24 Yes Laurence Locus, DO  QUEtiapine  (SEROQUEL ) 25 MG tablet Take 1 tablet (25 mg total) by mouth 2 (two) times daily. 04/09/23  Yes Laurence Locus, DO  traMADol  (ULTRAM ) 50 MG tablet Take 1 tablet (50 mg total) by mouth every 6 (six) hours as needed for moderate pain (pain score 4-6). 10/27/23  Yes [place], Eric J, DO  amLODipine  (NORVASC ) 5 MG tablet Take 1 tablet (5 mg total) by mouth daily. Patient taking differently: Take 5 mg by mouth in the morning. 02/01/23   Jolaine Pac, DO  iron  polysaccharides (NU-IRON ) 150 MG capsule Take 1 capsule (150 mg total) by mouth daily. 04/09/23 10/26/23  Laurence,  Eric, DO  lidocaine  (LIDODERM ) 5 % Place 1 patch onto the skin daily. Remove & Discard patch within 12 hours or as directed by MD 10/28/23   [place], Camellia PARAS, DO  loperamide  (IMODIUM ) 2 MG capsule Take 1 capsule (2 mg total) by mouth daily as needed for diarrhea or loose stools. 01/31/23   Jolaine Pac, DO  melatonin 5 MG TABS Take 1 tablet (5 mg total) by mouth at bedtime. 01/31/23   Jolaine Pac, DO    Physical Exam: BP 123/84 (BP Location: Right Arm)   Pulse (!) 107   Temp 98.4 F (36.9 C)  (Axillary)   Resp 18   Ht 5' 2 (1.575 m)   Wt 54.6 kg   SpO2 99%   BMI 22.02 kg/m  General:  Alert, oriented to self and place, calm, in no acute distress  Cardiovascular: RRR, no murmurs or rubs, no peripheral edema  Respiratory: clear to auscultation bilaterally, no wheezes, no crackles  Abdomen: soft, nontender, nondistended, normal bowel tones heard  Skin: dry, no rashes  Musculoskeletal: no joint effusions, normal range of motion  Psychiatric: appropriate affect, normal speech  Neurologic: extraocular muscles intact, clear speech, moving all extremities with intact sensorium         Labs on Admission:  Basic Metabolic Panel: Recent Labs  Lab 12/18/23 0712  NA 139  K 3.6  CL 110  CO2 12*  GLUCOSE 60*  BUN 39*  CREATININE 1.44*  CALCIUM  8.6*   Liver Function Tests: Recent Labs  Lab 12/18/23 0712  AST 75*  ALT 32  ALKPHOS 223*  BILITOT 0.7  PROT 6.9  ALBUMIN 3.1*   No results for input(s): LIPASE, AMYLASE in the last 168 hours. No results for input(s): AMMONIA in the last 168 hours. CBC: Recent Labs  Lab 12/18/23 0712  WBC 16.0*  NEUTROABS 13.6*  HGB 13.6  HCT 43.1  MCV 96.4  PLT 336   Cardiac Enzymes: No results for input(s): CKTOTAL, CKMB, CKMBINDEX, TROPONINI in the last 168 hours. BNP (last 3 results) No results for input(s): BNP in the last 8760 hours.  ProBNP (last 3 results) No results for input(s): PROBNP in the last 8760 hours.  CBG: No results for input(s): GLUCAP in the last 168 hours.  Radiological Exams on Admission: CT HEAD WO CONTRAST ( ) Result Date: 12/18/2023 EXAM: CT HEAD WITHOUT CONTRAST 12/18/2023 08:25:29 AM TECHNIQUE: CT of the head was performed without the administration of intravenous contrast. Automated exposure control, iterative reconstruction, and/or weight based adjustment of the mA/kV was utilized to reduce the radiation dose to as low as reasonably achievable. COMPARISON: MRI 01/24/2023 and  CT 01/25/2023. CLINICAL HISTORY: 78 year old female. Delirium, confusion, caretakers left, patient called 911 reporting kidnapping. FINDINGS: BRAIN AND VENTRICLES: No acute hemorrhage. No evidence of acute infarct. Chronic lacunar infarct of the anterior right basal ganglia with expected evolution since last year. Chronic right PCA territory infarct. Stable gray white differentiation elsewhere. Stable brain volume. No hydrocephalus. No extra-axial collection. No mass effect or midline shift. Calcified atherosclerosis of the skull base. No suspicious intracranial vascular hyperdensity. ORBITS: No acute abnormality. SINUSES: No acute abnormality. SOFT TISSUES AND SKULL: No acute soft tissue abnormality. No skull fracture. IMPRESSION: 1. No acute intracranial abnormality. 2. Chronic right PCA and right basal infarcts. Electronically signed by: Helayne Hurst MD 12/18/2023 08:37 AM EDT RP Workstation: [id number]   DG Chest Port 1 View if patient is in a treatment room. Result Date: 12/18/2023 EXAM: 1 VIEW(S) XRAY OF  THE CHEST 12/18/2023 06:28:00 AM COMPARISON: 01/24/2023 CLINICAL HISTORY: Suspected Sepsis. Altered mental status, tachycardia, suspected sepsis. FINDINGS: LUNGS AND PLEURA: Opacity within the medial aspect of the apical segment of the right upper lobe corresponds to prominent upper thoracic vasculature. No focal pulmonary opacity. No pulmonary edema. No pleural effusion. No pneumothorax. HEART AND MEDIASTINUM: Atherosclerotic plaque noted. No acute abnormality of the cardiac and mediastinal silhouettes. BONES AND SOFT TISSUES: No acute osseous abnormality. IMPRESSION: 1. No acute cardiopulmonary abnormality identified. Electronically signed by: Waddell Calk MD 12/18/2023 07:10 AM EDT RP Workstation: [id number]   Assessment/Plan Monica Conway is a 78 y.o. female with medical history significant for A-fib not on anticoagulation, hypertension, prior CVA being admitted to the hospital with sepsis due  to UTI complicated by A-fib with RVR.   Sepsis due to UTI-meeting criteria with tachycardia, leukocytosis, endorgan dysfunction with AMS.  Patient is hemodynamically stable, with normal lactic acid level. -Inpatient admission -Monitor closely on progressive -Follow-up blood and urine cultures -IV Rocephin  empirically  Atrial fibrillation with RVR-patient has known history of atrial fibrillation is on metoprolol  for rate control.  Not on anticoagulation due to GI bleeding earlier this year. -Continue home metoprolol  -IV Cardizem  drip, titrate per protocol -Telemetry monitoring  Hypertension-amlodipine  5 mg p.o. daily  AKI-patient has baseline normal renal function, likely has ATN due to sepsis -Hydrate as above for sepsis -Avoid nephrotoxins -Follow renal function daily  Dementia, CVA-continue home Seroquel , mirabegron ER  Chronic LFT elevation-noted, will trend as alk phos is rising slightly  DVT prophylaxis: Subcutaneous heparin     Code Status: Limited: Do not attempt resuscitation (DNR) -DNR-LIMITED -Do Not Intubate/DNI   Consults called: None  Admission status: The appropriate patient status for this patient is INPATIENT. Inpatient status is judged to be reasonable and necessary in order to provide the required intensity of service to ensure the patient's safety. The patient's presenting symptoms, physical exam findings, and initial radiographic and laboratory data in the context of their chronic comorbidities is felt to place them at high risk for further clinical deterioration. Furthermore, it is not anticipated that the patient will be medically stable for discharge from the hospital within 2 midnights of admission.    I certify that at the point of admission it is my clinical judgment that the patient will require inpatient hospital care spanning beyond 2 midnights from the point of admission due to high intensity of service, high risk for further deterioration and high  frequency of surveillance required  Time spent: 56 minutes  Keyerra Lamere CHRISTELLA Gail MD [facility] Pager [phone]  If 7PM-7AM, please contact night-coverage [email or web address] Password [email or web address]  12/18/2023, 10:26 AM

## 2023-12-18 NOTE — ED Provider Notes (Signed)
  [place] EMERGENCY DEPARTMENT AT [facility] Provider Assume Care Note I assumed care of Monica Conway on 12/18/2023 at 7 AM from Dr. Theadore.   Briefly, Monica Conway is a 78 y.o. female who: PMHx: Stroke, hypertension P/w altered mental status, called police that she was being abducted, police went to her house and found her alone at home, altered  Found to be in A-fib RVR, not on anticoagulants because of history of prior GI bleed, however now rate controlled on diltiazem  drip, also with UTI, no sepsis  Plan at the time of handoff: Admit to hospitalist service   Please refer to the original provider's note for additional information regarding the care of [facility].  Reassessment: Patient resting quietly in bed  Vital Signs:  ED Triage Vitals  Encounter Vitals Group     BP 12/18/23 0556 (!) 155/116     Girls Systolic BP Percentile --      Girls Diastolic BP Percentile --      Boys Systolic BP Percentile --      Boys Diastolic BP Percentile --      Pulse Rate 12/18/23 0556 (!) 110     Resp 12/18/23 0556 (!) 36     Temp 12/18/23 0556 97.9 F (36.6 C)     Temp Source 12/18/23 0556 Oral     SpO2 12/18/23 0556 97 %     Weight 12/18/23 0559 120 lb 5.9 oz (54.6 kg)     Height 12/18/23 0559 5' 2 (1.575 m)     Head Circumference --      Peak Flow --      Pain Score --      Pain Loc --      Pain Education --      Exclude from Growth Chart --      Hemodynamics:  The patient is hemodynamically stable. Mental Status:  The patient is alert  Additional MDM: Hospitalist consulted, discussed patient with Dr. Zella who accepted the patient to his service.  No additional acute events while patient was under my care.  Disposition: ADMIT: I believe the patient requires admission for further care and management. The patient was admitted to hospitalists. Please see inpatient provider note for additional treatment plan details.    FREDRIK CANDIE Later,  MD Emergency Medicine    Later Jerilynn RAMAN, MD 12/18/23 1018

## 2023-12-18 NOTE — ED Provider Notes (Signed)
 [facility]ER[facility]ENCY DEPT [facility] Emergency Department Provider Note MRN:  [id number]  Arrival date & time: 12/18/23     Chief Complaint   Altered Mental Status   History of Present Illness   Monica Conway is a 78 y.o. year-old female with a history of stroke, hypertension presenting to the ED with chief complaint of altered mental status.  Patient admits to confusion at home, thought she was being kidnapped or abducted, called the police.  The police had to break them her door.  EMS was then called because she was obviously confused, was in her own house and no one else was there.  She is endorsing pain all over but denies chest pain, no shortness of breath.  Review of Systems  I was unable to obtain a full/accurate HPI, PMH, or ROS due to the patient's altered mental status.  Patient's Health History    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/26/2023   Closed left hip fracture (HCC) 08/03/2015   Hypertension    Osteoporosis     Past Surgical History:  Procedure Laterality Date   COLON SURGERY     ESOPHAGOGASTRODUODENOSCOPY N/A 04/04/2023   Procedure: ESOPHAGOGASTRODUODENOSCOPY (EGD);  Surgeon: Rosalie Kitchens, MD;  Location: [facility];  Service: Gastroenterology;  Laterality: N/A;   TOTAL HIP ARTHROPLASTY Left 08/03/2015   Procedure: TOTAL LEFT HIP ARTHROPLASTY ANTERIOR APPROACH;  Surgeon: Redell Shoals, MD;  Location: [facility];  Service: Orthopedics;  Laterality: Left;   TUBAL LIGATION      Family History  Problem Relation Age of Onset   Cataracts Sister    Hypertension Sister    Hypertension Brother     Social History   Socioeconomic History   Marital status: Divorced    Spouse name: Not on file   Number of children: Not on file   Years of education: Not on file   Highest  education level: Not on file  Occupational History   Not on file  Tobacco Use   Smoking status: Never   Smokeless tobacco: Never  Substance and Sexual Activity   Alcohol  use: No   Drug use: No   Sexual activity: Not Currently  Other Topics Concern   Not on file  Social History Narrative   Not on file   Social Drivers of Health   Financial Resource Strain: Not on file  Food Insecurity: No Food Insecurity (10/25/2023)   Hunger Vital Sign    Worried About Running Out of Food in the Last Year: Never true    Ran Out of Food in the Last Year: Never true  Transportation Needs: No Transportation Needs (10/25/2023)   PRAPARE - Administrator, Civil Service (Medical): No    Lack of Transportation (Non-Medical): No  Physical Activity: Not on file  Stress: Not on file  Social Connections: Moderately Integrated (10/25/2023)   Social Connection and Isolation Panel    Frequency of Communication with Friends and Family: Three times a week    Frequency of Social Gatherings with Friends and Family: Three times a week    Attends Religious Services: More than 4 times per year    Active Member of Clubs or Organizations: Yes    Attends Banker Meetings: 1 to 4 times per year    Marital Status: Never married  Intimate Partner Violence: Not At Risk (10/25/2023)   Humiliation, Afraid, Rape, and Kick questionnaire    Fear of Current or Ex-Partner: No    Emotionally Abused: No    Physically Abused: No    Sexually Abused: No     Physical Exam   Vitals:   12/18/23 0715 12/18/23 0720  BP: 117/66   Pulse: 89 95  Resp: (!) 23 (!) 25  Temp:    SpO2: 99% 100%    CONSTITUTIONAL: Chronically ill-appearing, NAD NEURO/PSYCH:  Alert and oriented x 3, no focal deficits EYES:  eyes equal and reactive ENT/NECK:  no LAD, no JVD CARDIO: Tachycardic rate, well-perfused, normal S1 and S2 PULM:  CTAB no wheezing or rhonchi GI/GU:  non-distended, non-tender MSK/SPINE:  No gross  deformities, no edema SKIN:  no rash, atraumatic   *Additional and/or pertinent findings included in MDM below  Diagnostic and Interventional Summary    EKG Interpretation Date/Time:  December 18, 2023 at 05:51:40 Ventricular Rate:   157 PR Interval:    QRS Duration:   87 QT Interval:   279 QTC Calculation:  451 R Axis:      Text Interpretation: A-fib with RVR       Labs Reviewed  URINALYSIS, W/ REFLEX TO CULTURE (INFECTION SUSPECTED) - Abnormal; Notable for the following components:      Result Value   APPearance CLOUDY (*)    Hgb urine dipstick SMALL (*)    Ketones, ur 5 (*)    Protein, ur 100 (*)    Leukocytes,Ua LARGE (*)    Bacteria, UA MANY (*)    All other components within normal limits  CULTURE, BLOOD (ROUTINE X 2)  CULTURE, BLOOD (ROUTINE X 2)  URINE CULTURE  COMPREHENSIVE METABOLIC PANEL WITH GFR  CBC WITH DIFFERENTIAL/PLATELET  PROTIME-INR  I-STAT CG4 LACTIC ACID, ED    DG Chest Port 1 View if patient is in a treatment room.  Final Result    CT HEAD WO CONTRAST ( )    (Results Pending)    Medications  diltiazem  (CARDIZEM ) 1 mg/mL load via infusion 10 mg (10 mg Intravenous Bolus from Bag 12/18/23 0701)    And  diltiazem  (CARDIZEM ) 125 mg in dextrose  5% 125 mL (1 mg/mL) infusion (5 mg/hr Intravenous New Bag/Given 12/18/23 0701)  fentaNYL  (SUBLIMAZE ) injection 25 mcg (has no administration in time range)  cefTRIAXone  (ROCEPHIN ) 1 g in sodium chloride  0.9 % 100 mL IVPB (has no administration in time range)  sodium chloride  0.9 % bolus 1,000 mL (1,000 mLs Intravenous New Bag/Given 12/18/23 0717)     Procedures  /  Critical Care .Critical Care  Performed by: Theadore Ozell HERO, MD Authorized by: Theadore Ozell HERO, MD   Critical care provider statement:    Critical care time (minutes):  45   Critical care was necessary to treat or prevent imminent or life-threatening deterioration of the following conditions: A-fib with RVR.   Critical care was time  spent personally by me on the following activities:  Development of treatment plan with patient or surrogate, discussions with consultants, evaluation of patient's response to treatment, examination of patient, ordering and review of laboratory studies, ordering and review of radiographic studies, ordering and performing treatments and interventions, pulse oximetry, re-evaluation of patient's condition and review of old charts   ED Course and Medical Decision Making  Initial Impression and Ddx Patient my evaluation has a heart rate in the 160s, A-fib with RVR.  Per chart review has a history of paroxysmal A-fib,  recently taken off anticoagulation due to bleeding.  Unclear when this episode of A-fib started and so not a great cardioversion candidate at this time.  Starting diltiazem .  No fever, complaining of chronic hip and knee pain as her main complaint at this time.  Mildly confused.  Differential diagnosis includes electrolyte disturbance, underlying infection, subdural hematoma  Past medical/surgical history that increases complexity of ED encounter: A-fib  Interpretation of Diagnostics I personally reviewed the EKG and my interpretation is as follows: Hypertension, stroke  No significant blood count or electrolyte disturbance.  Urinalysis consistent with infection  Patient Reassessment and Ultimate Disposition/Management     Heart rate improved on diltiazem  drip.  Providing ceftriaxone , will need admission.  Signed out to oncoming provider at shift change.  Patient management required discussion with the following services or consulting groups:  None  Complexity of Problems Addressed Acute illness or injury that poses threat of life of bodily function  Additional Data Reviewed and Analyzed Further history obtained from: Prior labs/imaging results  Additional Factors Impacting ED Encounter Risk Consideration of hospitalization  Ozell HERO. Theadore, MD [facility]  Medicine [facility] [email or web address]  Final Clinical Impressions(s) / ED Diagnoses     ICD-10-CM   1. Altered mental status, unspecified altered mental status type  R41.82     2. Acute cystitis without hematuria  N30.00     3. Confusion  R41.0     4. Atrial fibrillation with rapid ventricular response (HCC)  I48.91       ED Discharge Orders     None        Discharge Instructions Discussed with and Provided to Patient:   Discharge Instructions   None      Theadore Ozell HERO, MD 12/18/23 0730

## 2023-12-19 DIAGNOSIS — A419 Sepsis, unspecified organism: Secondary | ICD-10-CM

## 2023-12-19 DIAGNOSIS — N390 Urinary tract infection, site not specified: Secondary | ICD-10-CM | POA: Diagnosis not present

## 2023-12-19 LAB — CBC
HCT: 37.6 % (ref 36.0–46.0)
Hemoglobin: 11.4 g/dL — ABNORMAL LOW (ref 12.0–15.0)
MCH: 30.4 pg (ref 26.0–34.0)
MCHC: 30.3 g/dL (ref 30.0–36.0)
MCV: 100.3 fL — ABNORMAL HIGH (ref 80.0–100.0)
Platelets: 238 K/uL (ref 150–400)
RBC: 3.75 MIL/uL — ABNORMAL LOW (ref 3.87–5.11)
RDW: 18.8 % — ABNORMAL HIGH (ref 11.5–15.5)
WBC: 10.9 K/uL — ABNORMAL HIGH (ref 4.0–10.5)
nRBC: 1.1 % — ABNORMAL HIGH (ref 0.0–0.2)

## 2023-12-19 LAB — COMPREHENSIVE METABOLIC PANEL WITH GFR
ALT: 24 U/L (ref 0–44)
AST: 55 U/L — ABNORMAL HIGH (ref 15–41)
Albumin: 2.7 g/dL — ABNORMAL LOW (ref 3.5–5.0)
Alkaline Phosphatase: 166 U/L — ABNORMAL HIGH (ref 38–126)
Anion gap: 15 (ref 5–15)
BUN: 27 mg/dL — ABNORMAL HIGH (ref 8–23)
CO2: 12 mmol/L — ABNORMAL LOW (ref 22–32)
Calcium: 8.3 mg/dL — ABNORMAL LOW (ref 8.9–10.3)
Chloride: 112 mmol/L — ABNORMAL HIGH (ref 98–111)
Creatinine, Ser: 1.19 mg/dL — ABNORMAL HIGH (ref 0.44–1.00)
GFR, Estimated: 47 mL/min — ABNORMAL LOW (ref >60)
Glucose, Bld: 66 mg/dL — ABNORMAL LOW (ref 70–99)
Potassium: 3.2 mmol/L — ABNORMAL LOW (ref 3.5–5.1)
Sodium: 139 mmol/L (ref 135–145)
Total Bilirubin: 0.5 mg/dL (ref 0.0–1.2)
Total Protein: 6.1 g/dL — ABNORMAL LOW (ref 6.5–8.1)

## 2023-12-19 MED ORDER — POTASSIUM CHLORIDE 20 MEQ PO PACK
60.0000 meq | PACK | Freq: Once | ORAL | Status: AC
  Administered 2023-12-19: 60 meq via ORAL
  Filled 2023-12-19: qty 3

## 2023-12-19 MED ORDER — HALOPERIDOL LACTATE 5 MG/ML IJ SOLN
2.0000 mg | Freq: Four times a day (QID) | INTRAMUSCULAR | Status: DC | PRN
  Administered 2023-12-19 – 2023-12-20 (×3): 2 mg via INTRAVENOUS
  Filled 2023-12-19 (×3): qty 1

## 2023-12-19 NOTE — Progress Notes (Signed)
 Pt making clear verbal threats to the staff at this time and is not aware of where she is or what the current situation is. Pt states that she wants to hit us  and shoot us  in the face. Actively pulling on lines and equipment. Not redirectable at this time. Pt with history of violent behavior towards staff on this admission. IV haldol 2mg  PRN given per MD order. Pt now calming down and more redirectable but remains confused to place and situation. Continue plan of care.

## 2023-12-19 NOTE — Plan of Care (Signed)
   Problem: Clinical Measurements: Goal: Ability to maintain clinical measurements within normal limits will improve Outcome: Progressing Goal: Will remain free from infection Outcome: Progressing

## 2023-12-19 NOTE — Progress Notes (Signed)
   12/19/23 0300  [place] Violence Checklist (Document every shift. If negative for 72 hrs, no longer required. Reassess if new symptoms are present)  Confusion 0  Irritable 1  Boisterous 1  Physical threats 1  Verbal threats 1  Attacking objects 1  Total Score 5  Violence Risk Category High Risk  Violence Prevention Guidelines *See Row Information* High Violence Risk interventions implemented

## 2023-12-19 NOTE — Progress Notes (Signed)
 PROGRESS NOTE    Monica Conway  FMW:[id number] DOB: September 26, 1945 DOA: 12/18/2023 PCP: [facility]    Brief Narrative:   Monica Conway is a 78 y.o. 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  Patient lives at home with assistance of caregivers, who left last night as they usually do.  She then called 911 stating that she was being kidnapped in her own house.  Patient was recently treated with oral antibiotics for UTI outpatient.  In the ED, temperature 97.9 F, HR 110, RR 36, BP 155/116, SpO2 97% on room air.  WBC 16.0, hemoglobin 13.6, platelet count 336.  Sodium 139, potassium 3.6, chloride 110, CO2 12, glucose 60, BUN 39, creatinine 1.44.  AST 75, ALT 32, total bilirubin 0.7.  Lactic acid 1.2.  Urinalysis with large leukocytes, negative nitrite, many bacteria, greater than 50 WBCs.  CT head without contrast with no acute intracranial abnormality.  Assessment & Plan:   Acute metabolic encephalopathy, POA E. coli/Proteus urinary tract infection Patient presenting to the ED with confusion.  Patient afebrile.  Elevated WC count of 16.0 and urinalysis consistent with UTI. -- Urine culture: + >100K proteus mirabilis and >30K Ecoli -- Blood cultures: No growth less than 24 hours -- Ceftriaxone  1 g IV every 24 hours -- Await culture susceptibilities  Paroxysmal atrial fibrillation with RVR HTN At time of presentation to the ED patient was noted to be tachycardic in A-fib with RVR.  On metoprolol  succinate 25 mg p.o. daily outpatient.  No longer on anticoagulation due to history of GI bleed/gastric ulcer.  Initially started on a Cardizem  drip with improvement now  discontinued. -- Continue metoprolol  succinate 25 mg p.o. daily -- Hold home amlodipine  for now -- Continue monitor on telemetry  Hypokalemia Potassium 3.2, will replete. -- Repeat electrolytes in a.m.  Hx gastric ulcer/upper GI bleed -- Protonix  40 m p.o. twice daily  History of CVA Patient with previous history of right basal ganglia CVA 01/2023, no focal deficits.  CT head on admission with no acute intracranial normality, noted chronic right PCA and right basal infarcts.  Weakness/ability/deconditioning: -- PT/OT evaluation  DVT prophylaxis: heparin  injection 5,000 Units Start: 12/18/23 1400    Code Status: Limited: Do not attempt resuscitation (DNR) -DNR-LIMITED -Do Not Intubate/DNI  Family Communication:   Disposition Plan:  Level of care: Progressive Status is: Inpatient Remains inpatient appropriate because: IV antibiotics, awaiting urine culture susceptibilities    Consultants:  None  Procedures:  None  Antimicrobials:  Ceftriaxone  10/24>>   Subjective: Patient seen examined at bedside, lying in bed.  Nursing student present at bedside.  Complaining of some back pain.  Heart rate well-controlled, discussed with primary RN to discontinue Cardizem  drip.  Remains on IV antibiotics.  Awaiting urine culture sensitivities, currently growing E. coli/Proteus.  Objective: Vitals:   12/19/23 0038 12/19/23 0041 12/19/23 0516 12/19/23 1300  BP: 111/70 106/84 115/62 117/62  Pulse: 87 87 80 79  Resp:   16 20  Temp:  98.7 F (37.1 C) (!) 97.5 F (36.4 C) 98 F (36.7 C)  TempSrc:  Oral Oral Oral  SpO2:   99% 99%  Weight:      Height:        Intake/Output Summary (Last  24 hours) at 12/19/2023 1407 Last data filed at 12/19/2023 0930 Gross per 24 hour  Intake 345.22 ml  Output 200 ml  Net 145.22 ml   Filed Weights   12/18/23 0559 12/18/23 1625  Weight: 54.6 kg 41.4 kg    Examination:  Physical Exam: GEN: NAD, alert, elderly in appearance HEENT: NCAT,  PERRL, EOMI, sclera clear, MMM PULM: CTAB w/o wheezes/crackles, normal respiratory effort, on room air CV: RRR w/o M/G/R GI: abd soft, NTND, + BS MSK: no peripheral edema, moves all extremities independently NEURO: No focal neurological deficit PSYCH: normal mood/affect Integumentary: No concerning rashes/lesions/wounds none exposed skin surfaces    Data Reviewed: I have personally reviewed following labs and imaging studies  CBC: Recent Labs  Lab 12/18/23 0712 12/19/23 0502  WBC 16.0* 10.9*  NEUTROABS 13.6*  --   HGB 13.6 11.4*  HCT 43.1 37.6  MCV 96.4 100.3*  PLT 336 238   Basic Metabolic Panel: Recent Labs  Lab 12/18/23 0712 12/19/23 0502  NA 139 139  K 3.6 3.2*  CL 110 112*  CO2 12* 12*  GLUCOSE 60* 66*  BUN 39* 27*  CREATININE 1.44* 1.19*  CALCIUM  8.6* 8.3*   GFR: Estimated Creatinine Clearance: 25.9 mL/min (A) (by C-G formula based on SCr of 1.19 mg/dL (H)). Liver Function Tests: Recent Labs  Lab 12/18/23 9287 12/19/23 0502  AST 75* 55*  ALT 32 24  ALKPHOS 223* 166*  BILITOT 0.7 0.5  PROT 6.9 6.1*  ALBUMIN 3.1* 2.7*   No results for input(s): LIPASE, AMYLASE in the last 168 hours. No results for input(s): AMMONIA in the last 168 hours. Coagulation Profile: Recent Labs  Lab 12/18/23 0712  INR 1.2   Cardiac Enzymes: No results for input(s): CKTOTAL, CKMB, CKMBINDEX, TROPONINI in the last 168 hours. BNP (last 3 results) No results for input(s): PROBNP in the last 8760 hours. HbA1C: No results for input(s): HGBA1C in the last 72 hours. CBG: No results for input(s): GLUCAP in the last 168 hours. Lipid Profile: No results for input(s): CHOL, HDL, LDLCALC, TRIG, CHOLHDL, LDLDIRECT in the last 72 hours. Thyroid Function Tests: No results for input(s): TSH, T4TOTAL, FREET4, T3FREE, THYROIDAB in the last 72 hours. Anemia Panel: No results for input(s): VITAMINB12, FOLATE, FERRITIN, TIBC, IRON ,  RETICCTPCT in the last 72 hours. Sepsis Labs: Recent Labs  Lab 12/18/23 0708 12/18/23 0912  LATICACIDVEN 1.2 1.0    Recent Results (from the past 240 hours)  Urine Culture     Status: Abnormal (Preliminary result)   Collection Time: 12/18/23  6:13 AM   Specimen: Urine, Random  Result Value Ref Range Status   Specimen Description   Final    URINE, RANDOM Performed at [facility], [address] [place], [place] [address]    Special Requests   Final    NONE Reflexed from [id number] Performed at [facility], [address], [place], [place] [address]    Culture (A)  Final    >=100,000 COLONIES/mL PROTEUS MIRABILIS 30,000 COLONIES/mL ESCHERICHIA COLI SUSCEPTIBILITIES TO FOLLOW Performed at [facility], [address] [place], [place] [address]    Report Status PENDING  Incomplete  Culture, blood (Routine x 2)     Status: None (Preliminary result)   Collection Time: 12/18/23  7:11 AM   Specimen: BLOOD  Result Value Ref Range Status   Specimen Description   Final    BLOOD RIGHT ANTECUBITAL Performed at [facility], [address]  [place], [place] [address]    Special Requests   Final    BOTTLES DRAWN AEROBIC AND ANAEROBIC Blood Culture adequate volume Performed at [facility], [address], [place], [place] [address]    Culture   Final    NO GROWTH < 24 HOURS Performed at [facility], [address] [place], [place] [address]    Report Status PENDING  Incomplete  Culture, blood (Routine x 2)     Status: None (Preliminary result)   Collection Time: 12/18/23  9:01 AM   Specimen: BLOOD  Result Value Ref Range Status   Specimen Description   Final    BLOOD LEFT ANTECUBITAL Performed at [facility], [address], [place], [place] [address]    Special Requests   Final    BOTTLES DRAWN AEROBIC AND ANAEROBIC Blood Culture adequate volume Performed at [facility], [address] [place], [place] [address]    Culture   Final    NO GROWTH < 24 HOURS Performed at [facility], [address], [place], [place] [address]    Report Status PENDING  Incomplete         Radiology Studies: CT HEAD WO CONTRAST ( ) Result Date: 12/18/2023 EXAM: CT HEAD WITHOUT CONTRAST 12/18/2023 08:25:29 AM TECHNIQUE: CT of the head was performed without the administration of intravenous contrast. Automated exposure control, iterative reconstruction, and/or weight based adjustment of the mA/kV was utilized to reduce the radiation dose to as low as reasonably achievable. COMPARISON: MRI 01/24/2023 and CT 01/25/2023. CLINICAL HISTORY: 78 year old female. Delirium, confusion, caretakers left, patient called 911 reporting kidnapping. FINDINGS: BRAIN AND VENTRICLES: No acute hemorrhage. No evidence of acute infarct. Chronic lacunar infarct of the anterior right basal ganglia with expected evolution since last year. Chronic right PCA territory infarct. Stable gray white differentiation elsewhere. Stable brain volume. No hydrocephalus. No extra-axial collection. No mass effect or midline shift. Calcified atherosclerosis of the skull base. No suspicious intracranial vascular hyperdensity. ORBITS: No acute abnormality. SINUSES: No acute abnormality. SOFT TISSUES AND SKULL: No acute soft tissue abnormality. No skull fracture. IMPRESSION: 1. No acute intracranial abnormality. 2. Chronic right PCA and right basal infarcts. Electronically signed by: Helayne Hurst MD 12/18/2023 08:37 AM EDT RP Workstation: [id number]   DG Chest Port 1 View if patient is in a treatment room. Result Date: 12/18/2023 EXAM: 1 VIEW(S) XRAY OF THE CHEST 12/18/2023 06:28:00 AM COMPARISON: 01/24/2023 CLINICAL HISTORY: Suspected Sepsis. Altered mental status, tachycardia, suspected sepsis. FINDINGS: LUNGS AND PLEURA: Opacity within the medial aspect of the apical segment of the right upper  lobe corresponds to prominent upper thoracic vasculature. No focal pulmonary opacity. No pulmonary edema. No pleural effusion. No pneumothorax. HEART AND MEDIASTINUM: Atherosclerotic plaque noted. No acute abnormality of the cardiac and mediastinal silhouettes. BONES AND SOFT TISSUES: No acute osseous abnormality. IMPRESSION: 1. No acute cardiopulmonary abnormality identified. Electronically signed by: Taylor Stroud MD 12/18/2023 07:10 AM EDT RP Workstation: [id number]        Scheduled Meds:  heparin  injection (subcutaneous)  5,000 Units Subcutaneous Q8H   metoprolol  succinate  25 mg Oral Daily   mirabegron ER  25 mg Oral Daily   pantoprazole   40 mg Oral BID   potassium chloride   60 mEq Oral Once   QUEtiapine   25 mg Oral BID   Continuous Infusions:  cefTRIAXone  (ROCEPHIN )  IV 1 g (12/19/23 1033)     LOS: 1 day    Time spent: 51  minutes spent on 12/19/2023 caring for this patient face-to-face including chart review, ordering labs/tests, documenting, discussion with nursing staff, consultants, updating family and interview/physical exam    Monica PARAS Mata Rowen, DO [facility] Available via Epic secure chat 7am-7pm After these hours, please refer to coverage provider listed on [email or web address] 12/19/2023, 2:07 PM

## 2023-12-20 ENCOUNTER — Encounter (HOSPITAL_COMMUNITY): Payer: Self-pay | Admitting: Internal Medicine

## 2023-12-20 DIAGNOSIS — A419 Sepsis, unspecified organism: Secondary | ICD-10-CM | POA: Diagnosis not present

## 2023-12-20 DIAGNOSIS — N390 Urinary tract infection, site not specified: Secondary | ICD-10-CM | POA: Diagnosis not present

## 2023-12-20 LAB — PHOSPHORUS: Phosphorus: 2.2 mg/dL — ABNORMAL LOW (ref 2.5–4.6)

## 2023-12-20 LAB — BASIC METABOLIC PANEL WITH GFR
Anion gap: 12 (ref 5–15)
BUN: 17 mg/dL (ref 8–23)
CO2: 14 mmol/L — ABNORMAL LOW (ref 22–32)
Calcium: 8.7 mg/dL — ABNORMAL LOW (ref 8.9–10.3)
Chloride: 114 mmol/L — ABNORMAL HIGH (ref 98–111)
Creatinine, Ser: 0.97 mg/dL (ref 0.44–1.00)
GFR, Estimated: >60 mL/min (ref >60)
Glucose, Bld: 96 mg/dL (ref 70–99)
Potassium: 4 mmol/L (ref 3.5–5.1)
Sodium: 140 mmol/L (ref 135–145)

## 2023-12-20 LAB — CBC
HCT: 36.7 % (ref 36.0–46.0)
Hemoglobin: 12 g/dL (ref 12.0–15.0)
MCH: 31.3 pg (ref 26.0–34.0)
MCHC: 32.7 g/dL (ref 30.0–36.0)
MCV: 95.6 fL (ref 80.0–100.0)
Platelets: 269 K/uL (ref 150–400)
RBC: 3.84 MIL/uL — ABNORMAL LOW (ref 3.87–5.11)
RDW: 18.6 % — ABNORMAL HIGH (ref 11.5–15.5)
WBC: 12.1 K/uL — ABNORMAL HIGH (ref 4.0–10.5)
nRBC: 0.3 % — ABNORMAL HIGH (ref 0.0–0.2)

## 2023-12-20 LAB — MAGNESIUM: Magnesium: 1.8 mg/dL (ref 1.7–2.4)

## 2023-12-20 LAB — URINE CULTURE: Culture: >=100000 — AB

## 2023-12-20 MED ORDER — METOPROLOL SUCCINATE ER 25 MG PO TB24
25.0000 mg | ORAL_TABLET | Freq: Once | ORAL | Status: AC
  Administered 2023-12-20: 25 mg via ORAL
  Filled 2023-12-20: qty 1

## 2023-12-20 MED ORDER — CEFADROXIL 500 MG PO CAPS
1000.0000 mg | ORAL_CAPSULE | Freq: Two times a day (BID) | ORAL | Status: DC
  Administered 2023-12-20 – 2023-12-21 (×2): 1000 mg via ORAL
  Filled 2023-12-20 (×2): qty 2

## 2023-12-20 MED ORDER — DILTIAZEM HCL 25 MG/5ML IV SOLN
10.0000 mg | Freq: Once | INTRAVENOUS | Status: AC
  Administered 2023-12-20: 10 mg via INTRAVENOUS
  Filled 2023-12-20: qty 5

## 2023-12-20 MED ORDER — SODIUM CHLORIDE 0.9 % IV BOLUS
500.0000 mL | Freq: Once | INTRAVENOUS | Status: AC
Start: 2023-12-20 — End: 2023-12-21
  Administered 2023-12-20: 500 mL via INTRAVENOUS

## 2023-12-20 MED ORDER — SODIUM CHLORIDE 0.9 % IV SOLN
INTRAVENOUS | Status: AC
Start: 2023-12-20 — End: 2023-12-21

## 2023-12-20 MED ORDER — METOPROLOL SUCCINATE ER 50 MG PO TB24
50.0000 mg | ORAL_TABLET | Freq: Every day | ORAL | Status: DC
Start: 2023-12-21 — End: 2023-12-29
  Administered 2023-12-21 – 2023-12-29 (×8): 50 mg via ORAL
  Filled 2023-12-20 (×8): qty 1

## 2023-12-20 MED ORDER — HALOPERIDOL LACTATE 5 MG/ML IJ SOLN
5.0000 mg | Freq: Four times a day (QID) | INTRAMUSCULAR | Status: DC | PRN
  Administered 2023-12-21 – 2023-12-25 (×4): 5 mg via INTRAVENOUS
  Filled 2023-12-20 (×4): qty 1

## 2023-12-20 MED ORDER — MELATONIN 3 MG PO TABS
3.0000 mg | ORAL_TABLET | Freq: Every day | ORAL | Status: DC
  Administered 2023-12-20 – 2023-12-28 (×8): 3 mg via ORAL
  Filled 2023-12-20 (×9): qty 1

## 2023-12-20 MED ORDER — DILTIAZEM HCL-DEXTROSE 125-5 MG/125ML-% IV SOLN (PREMIX)
5.0000 mg/h | INTRAVENOUS | Status: DC
  Administered 2023-12-20: 7.5 mg/h via INTRAVENOUS
  Administered 2023-12-20: 5 mg/h via INTRAVENOUS
  Administered 2023-12-21: 7.5 mg/h via INTRAVENOUS
  Filled 2023-12-20 (×2): qty 125

## 2023-12-20 MED ORDER — ENSURE PLUS HIGH PROTEIN PO LIQD
237.0000 mL | Freq: Two times a day (BID) | ORAL | Status: DC
  Administered 2023-12-24 – 2023-12-25 (×3): 237 mL via ORAL

## 2023-12-20 MED ORDER — METOPROLOL TARTRATE 5 MG/5ML IV SOLN
5.0000 mg | Freq: Once | INTRAVENOUS | Status: AC
  Administered 2023-12-20: 5 mg via INTRAVENOUS
  Filled 2023-12-20: qty 5

## 2023-12-20 MED ORDER — DILTIAZEM LOAD VIA INFUSION
10.0000 mg | Freq: Once | INTRAVENOUS | Status: AC
Start: 2023-12-20 — End: 2023-12-20
  Administered 2023-12-20: 10 mg via INTRAVENOUS
  Filled 2023-12-20: qty 10

## 2023-12-20 NOTE — Progress Notes (Addendum)
   12/20/23 0846  Assess: MEWS Score  Temp 98.4 F (36.9 C)  BP (!) 121/96  MAP (mmHg) 105  Pulse Rate (!) 148  Resp 14  SpO2 100 %  O2 Device Room Air  Assess: MEWS Score  MEWS Temp 0  MEWS Systolic 0  MEWS Pulse 3  MEWS RR 0  MEWS LOC 0  MEWS Score 3  MEWS Score Color Yellow  Assess: if the MEWS score is Yellow or Red  Were vital signs accurate and taken at a resting state? Yes  Does the patient meet 2 or more of the SIRS criteria? Yes  Does the patient have a confirmed or suspected source of infection? Yes  MEWS guidelines implemented  Yes, yellow  Treat  MEWS Interventions Considered administering scheduled or prn medications/treatments as ordered  Take Vital Signs  Increase Vital Sign Frequency  Yellow: Q2hr x1, continue Q4hrs until patient remains green for 12hrs  Escalate  MEWS: Escalate Yellow: Discuss with charge nurse and consider notifying provider and/or RRT  Notify: Charge Nurse/RN  Name of Charge Nurse/RN Notified Megan, RN  Provider Notification  Provider Name/Title Dr. [place]  Date Provider Notified 12/20/23  Time Provider Notified [phone]  Method of Notification Page  Notification Reason Change in status (Increased HR)  Provider response See new orders  Date of Provider Response 12/20/23  Time of Provider Response 0847  Assess: SIRS CRITERIA  SIRS Temperature  0  SIRS Respirations  0  SIRS Pulse 1  SIRS WBC 1  SIRS Score Sum  2     Given scheduled motoroprol, given additional 5 mg IV metoprolol . Pt. HR remained elevated. MD notified. Gave Cardizem  IV push, HR lowered for approximately an hour. MD notified. Started fluids. HR remained elevated. MD notified. Started her on a Cardizem  drip. HR currently 91.

## 2023-12-20 NOTE — Progress Notes (Signed)
 OT Cancellation Note  Patient Details Name: Monica Conway MRN: [id number] DOB: Jan 07, 1946   Cancelled Treatment:    Reason Eval/Treat Not Completed: Medical issues which prohibited therapy a fib with HR up to 160s. Will hold therapy attempts at this time  Mliss Fish 12/20/2023, 12:20 PM

## 2023-12-20 NOTE — Progress Notes (Signed)
 PROGRESS NOTE    Monica Conway  FMW:[id number] DOB: 22-Jan-1946 DOA: 12/18/2023 PCP: [facility]    Brief Narrative:   Monica Conway is a 78 y.o. 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  Patient lives at home with assistance of caregivers, who left last night as they usually do.  She then called 911 stating that she was being kidnapped in her own house.  Patient was recently treated with oral antibiotics for UTI outpatient.  In the ED, temperature 97.9 F, HR 110, RR 36, BP 155/116, SpO2 97% on room air.  WBC 16.0, hemoglobin 13.6, platelet count 336.  Sodium 139, potassium 3.6, chloride 110, CO2 12, glucose 60, BUN 39, creatinine 1.44.  AST 75, ALT 32, total bilirubin 0.7.  Lactic acid 1.2.  Urinalysis with large leukocytes, negative nitrite, many bacteria, greater than 50 WBCs.  CT head without contrast with no acute intracranial abnormality.  Assessment & Plan:   Acute metabolic encephalopathy, POA E. coli/Proteus urinary tract infection Patient presenting to the ED with confusion.  Patient afebrile.  Elevated WBC count of 16.0 and urinalysis consistent with UTI. -- Urine culture: + >100K proteus mirabilis and >30K Ecoli -- Blood cultures: No growth x 2 days -- Cefadroxil 1 g p.o. twice daily  Paroxysmal atrial fibrillation with RVR HTN At time of presentation to the ED patient was noted to be tachycardic in A-fib with RVR.  On metoprolol  succinate 25 mg p.o. daily outpatient.  No longer on anticoagulation due to history of GI bleed/gastric ulcer.  Initially started on a Cardizem  drip with improvement now discontinued. -- Increase metoprolol  succinate to 50  mg p.o. daily -- IV fluid bolus followed by continuous infusion due to volume depletion -- Hold home amlodipine  for now -- Continue monitor on telemetry  Hypokalemia Repleted, potassium 4.0 today. -- Repeat electrolytes in a.m.  Hx gastric ulcer/upper GI bleed -- Protonix  40 m p.o. twice daily  History of CVA Patient with previous history of right basal ganglia CVA 01/2023, no focal deficits.  CT head on admission with no acute intracranial normality, noted chronic right PCA and right basal infarcts.  Cognitive impairment -- Delirium precautions -- Get up during the day -- Encourage a familiar face to remain present throughout the day -- Keep blinds open and lights on during daylight hours -- Minimize the use of opioids/benzodiazepines -- Seroquel  25 mg PO BID -- Melatonin 3 mg pO qHS -- Haldol 5mg  IV q6h PRN agitation  Weakness/ability/deconditioning: -- PT/OT evaluation: Pending  DVT prophylaxis: heparin  injection 5,000 Units Start: 12/18/23 1400    Code Status: Limited: Do not attempt resuscitation (DNR) -DNR-LIMITED -Do Not Intubate/DNI  Family Communication:   Disposition Plan:  Level of care: Progressive Status is: Inpatient Remains inpatient appropriate because: Need better control of A-fib with RVR, pending PT/OT evaluation, transitioning IV antibiotics to oral today    Consultants:  None  Procedures:  None  Antimicrobials:  Ceftriaxone  10/24 - 10/26 Cefadroxil 10/26>>   Subjective: Patient seen examined at bedside, lying in bed.  Has mitts on her hand.  Overnight was agitated, received Haldol.  Remains pleasantly confused.  Heart rate now elevated with A-fib with RVR, will give IV metoprolol , IV Cardizem  and increase oral metoprolol   dose.  Also likely volume depleted, will give IV fluid bolus followed by continuous infusion.  No family present at bedside.  Patient denies headache, no chest pain, no shortness of breath, no abdominal pain, no generalized  discomfort.  No other concerns overnight per nurse staff.   Objective: Vitals:   12/20/23 0800 12/20/23 0836 12/20/23 0846 12/20/23 1128  BP:  (!) 113/99 (!) 121/96   Pulse:  81 (!) 148 (!) 125  Resp:   14   Temp:   98.4 F (36.9 C)   TempSrc:   Oral   SpO2: 100%  100%   Weight:      Height:        Intake/Output Summary (Last 24 hours) at 12/20/2023 1152 Last data filed at 12/19/2023 1857 Gross per 24 hour  Intake 372.54 ml  Output 100 ml  Net 272.54 ml   Filed Weights   12/18/23 0559 12/18/23 1625  Weight: 54.6 kg 41.4 kg    Examination:  Physical Exam: GEN: NAD, alert, elderly in appearance HEENT: NCAT, PERRL, EOMI, sclera clear, MMM PULM: CTAB w/o wheezes/crackles, normal respiratory effort, on room air CV: RRR w/o M/G/R GI: abd soft, NTND, + BS MSK: no peripheral edema, moves all extremities independently NEURO: No focal neurological deficit PSYCH: normal mood/affect Integumentary: No concerning rashes/lesions/wounds none exposed skin surfaces    Data Reviewed: I have personally reviewed following labs and imaging studies  CBC: Recent Labs  Lab 12/18/23 0712 12/19/23 0502 12/20/23 0430  WBC 16.0* 10.9* 12.1*  NEUTROABS 13.6*  --   --   HGB 13.6 11.4* 12.0  HCT 43.1 37.6 36.7  MCV 96.4 100.3* 95.6  PLT 336 238 269   Basic Metabolic Panel: Recent Labs  Lab 12/18/23 0712 12/19/23 0502 12/20/23 0430  NA 139 139 140  K 3.6 3.2* 4.0  CL 110 112* 114*  CO2 12* 12* 14*  GLUCOSE 60* 66* 96  BUN 39* 27* 17  CREATININE 1.44* 1.19* 0.97  CALCIUM  8.6* 8.3* 8.7*  MG  --   --  1.8  PHOS  --   --  2.2*   GFR: Estimated Creatinine Clearance: 31.7 mL/min (by C-G formula based on SCr of 0.97 mg/dL). Liver Function Tests: Recent Labs  Lab 12/18/23 9287 12/19/23 0502  AST 75* 55*  ALT 32 24  ALKPHOS 223* 166*  BILITOT 0.7 0.5  PROT 6.9 6.1*  ALBUMIN 3.1* 2.7*   No results for input(s): LIPASE, AMYLASE in the last 168 hours. No results for  input(s): AMMONIA in the last 168 hours. Coagulation Profile: Recent Labs  Lab 12/18/23 0712  INR 1.2   Cardiac Enzymes: No results for input(s): CKTOTAL, CKMB, CKMBINDEX, TROPONINI in the last 168 hours. BNP (last 3 results) No results for input(s): PROBNP in the last 8760 hours. HbA1C: No results for input(s): HGBA1C in the last 72 hours. CBG: No results for input(s): GLUCAP in the last 168 hours. Lipid Profile: No results for input(s): CHOL, HDL, LDLCALC, TRIG, CHOLHDL, LDLDIRECT in the last 72 hours. Thyroid Function Tests: No results for input(s): TSH, T4TOTAL, FREET4, T3FREE, THYROIDAB in the last 72 hours. Anemia Panel: No results for input(s): VITAMINB12, FOLATE, FERRITIN, TIBC, IRON , RETICCTPCT in the last 72 hours. Sepsis Labs: Recent Labs  Lab 12/18/23 0708 12/18/23 0912  LATICACIDVEN 1.2 1.0    Recent Results (from the past 240 hours)  Urine Culture     Status: Abnormal   Collection Time: 12/18/23  6:13 AM   Specimen: Urine, Random  Result Value Ref Range Status   Specimen Description   Final    URINE, RANDOM Performed at [facility], [address] [place], [place] [address]    Special Requests   Final    NONE Reflexed from [id number] Performed at [facility], [address] [place], [place] [address]    Culture (A)  Final    >=100,000 COLONIES/mL PROTEUS MIRABILIS 30,000 COLONIES/mL ESCHERICHIA COLI    Report Status 12/20/2023 FINAL  Final   Organism ID, Bacteria PROTEUS MIRABILIS (A)  Final   Organism ID, Bacteria ESCHERICHIA COLI (A)  Final      Susceptibility   Escherichia coli - MIC*    AMPICILLIN >=32 RESISTANT Resistant     CEFAZOLIN  (URINE) Value in next row Sensitive      2 SENSITIVEThis is a modified FDA-approved test that has been validated and its performance characteristics determined by the reporting laboratory.  This laboratory is certified under the  Clinical Laboratory Improvement Amendments CLIA as qualified to perform high complexity clinical laboratory testing.    CEFEPIME  Value in next row Sensitive      2 SENSITIVEThis is a modified FDA-approved test that has been validated and its performance characteristics determined by the reporting laboratory.  This laboratory is certified under the Clinical Laboratory Improvement Amendments CLIA as qualified to perform high complexity clinical laboratory testing.    ERTAPENEM Value in next row Sensitive      2 SENSITIVEThis is a modified FDA-approved test that has been validated and its performance characteristics determined by the reporting laboratory.  This laboratory is certified under the Clinical Laboratory Improvement Amendments CLIA as qualified to perform high complexity clinical laboratory testing.    CEFTRIAXONE  Value in next row Sensitive      2 SENSITIVEThis is a modified FDA-approved test that has been validated and its performance characteristics determined by the reporting laboratory.  This laboratory is certified under the Clinical Laboratory Improvement Amendments CLIA as qualified to perform high complexity clinical laboratory testing.    CIPROFLOXACIN Value in next row Sensitive      2 SENSITIVEThis is a modified FDA-approved test that has been validated and its performance characteristics determined by the reporting laboratory.  This laboratory is certified under the Clinical Laboratory Improvement Amendments CLIA as qualified to perform high complexity clinical laboratory testing.    GENTAMICIN Value in next row Sensitive      2 SENSITIVEThis is a modified FDA-approved test that has been validated and its performance characteristics determined by the reporting laboratory.  This laboratory is certified under the Clinical Laboratory Improvement Amendments CLIA as qualified to perform high complexity clinical laboratory testing.    NITROFURANTOIN Value in next row Sensitive      2  SENSITIVEThis is a modified FDA-approved test that has been validated and its performance characteristics determined by the reporting laboratory.  This laboratory is certified under the Clinical Laboratory Improvement Amendments CLIA as qualified to perform high complexity clinical laboratory testing.    TRIMETH/SULFA Value in next row Sensitive      2 SENSITIVEThis is a modified FDA-approved test that has been validated and its performance characteristics determined by the reporting laboratory.  This laboratory is certified under the Clinical Laboratory Improvement Amendments CLIA as qualified to perform high complexity clinical laboratory testing.    AMPICILLIN/SULBACTAM Value in next row Intermediate      2 SENSITIVEThis is a modified FDA-approved test that has been validated and its performance characteristics determined by the  reporting laboratory.  This laboratory is certified under the Clinical Laboratory Improvement Amendments CLIA as qualified to perform high complexity clinical laboratory testing.    PIP/TAZO Value in next row Sensitive      <=4 SENSITIVEThis is a modified FDA-approved test that has been validated and its performance characteristics determined by the reporting laboratory.  This laboratory is certified under the Clinical Laboratory Improvement Amendments CLIA as qualified to perform high complexity clinical laboratory testing.    MEROPENEM Value in next row Sensitive      <=4 SENSITIVEThis is a modified FDA-approved test that has been validated and its performance characteristics determined by the reporting laboratory.  This laboratory is certified under the Clinical Laboratory Improvement Amendments CLIA as qualified to perform high complexity clinical laboratory testing.    * 30,000 COLONIES/mL ESCHERICHIA COLI   Proteus mirabilis - MIC*    AMPICILLIN Value in next row Sensitive      <=4 SENSITIVEThis is a modified FDA-approved test that has been validated and its performance  characteristics determined by the reporting laboratory.  This laboratory is certified under the Clinical Laboratory Improvement Amendments CLIA as qualified to perform high complexity clinical laboratory testing.    CEFAZOLIN  (URINE) Value in next row Sensitive      4 SENSITIVEThis is a modified FDA-approved test that has been validated and its performance characteristics determined by the reporting laboratory.  This laboratory is certified under the Clinical Laboratory Improvement Amendments CLIA as qualified to perform high complexity clinical laboratory testing.    CEFEPIME  Value in next row Sensitive      4 SENSITIVEThis is a modified FDA-approved test that has been validated and its performance characteristics determined by the reporting laboratory.  This laboratory is certified under the Clinical Laboratory Improvement Amendments CLIA as qualified to perform high complexity clinical laboratory testing.    ERTAPENEM Value in next row Sensitive      4 SENSITIVEThis is a modified FDA-approved test that has been validated and its performance characteristics determined by the reporting laboratory.  This laboratory is certified under the Clinical Laboratory Improvement Amendments CLIA as qualified to perform high complexity clinical laboratory testing.    CEFTRIAXONE  Value in next row Sensitive      4 SENSITIVEThis is a modified FDA-approved test that has been validated and its performance characteristics determined by the reporting laboratory.  This laboratory is certified under the Clinical Laboratory Improvement Amendments CLIA as qualified to perform high complexity clinical laboratory testing.    CIPROFLOXACIN Value in next row Resistant      4 SENSITIVEThis is a modified FDA-approved test that has been validated and its performance characteristics determined by the reporting laboratory.  This laboratory is certified under the Clinical Laboratory Improvement Amendments CLIA as qualified to perform  high complexity clinical laboratory testing.    GENTAMICIN Value in next row Sensitive      4 SENSITIVEThis is a modified FDA-approved test that has been validated and its performance characteristics determined by the reporting laboratory.  This laboratory is certified under the Clinical Laboratory Improvement Amendments CLIA as qualified to perform high complexity clinical laboratory testing.    NITROFURANTOIN Value in next row Resistant      4 SENSITIVEThis is a modified FDA-approved test that has been validated and its performance characteristics determined by the reporting laboratory.  This laboratory is certified under the Clinical Laboratory Improvement Amendments CLIA as qualified to perform high complexity clinical laboratory testing.    TRIMETH/SULFA Value in next row Sensitive  4 SENSITIVEThis is a modified FDA-approved test that has been validated and its performance characteristics determined by the reporting laboratory.  This laboratory is certified under the Clinical Laboratory Improvement Amendments CLIA as qualified to perform high complexity clinical laboratory testing.    AMPICILLIN/SULBACTAM Value in next row Sensitive      4 SENSITIVEThis is a modified FDA-approved test that has been validated and its performance characteristics determined by the reporting laboratory.  This laboratory is certified under the Clinical Laboratory Improvement Amendments CLIA as qualified to perform high complexity clinical laboratory testing.    PIP/TAZO Value in next row Sensitive      <=4 SENSITIVEThis is a modified FDA-approved test that has been validated and its performance characteristics determined by the reporting laboratory.  This laboratory is certified under the Clinical Laboratory Improvement Amendments CLIA as qualified to perform high complexity clinical laboratory testing.    MEROPENEM Value in next row Sensitive      <=4 SENSITIVEThis is a modified FDA-approved test that has been  validated and its performance characteristics determined by the reporting laboratory.  This laboratory is certified under the Clinical Laboratory Improvement Amendments CLIA as qualified to perform high complexity clinical laboratory testing.    * >=100,000 COLONIES/mL PROTEUS MIRABILIS  Culture, blood (Routine x 2)     Status: None (Preliminary result)   Collection Time: 12/18/23  7:11 AM   Specimen: BLOOD  Result Value Ref Range Status   Specimen Description   Final    BLOOD RIGHT ANTECUBITAL Performed at [facility], [address], [place], [place] [address]    Special Requests   Final    BOTTLES DRAWN AEROBIC AND ANAEROBIC Blood Culture adequate volume Performed at [facility], [address], [place], [place] [address]    Culture   Final    NO GROWTH 2 DAYS Performed at [facility], [address], [place], [place] [address]    Report Status PENDING  Incomplete  Culture, blood (Routine x 2)     Status: None (Preliminary result)   Collection Time: 12/18/23  9:01 AM   Specimen: BLOOD  Result Value Ref Range Status   Specimen Description   Final    BLOOD LEFT ANTECUBITAL Performed at [facility], [address], [place], [place] [address]    Special Requests   Final    BOTTLES DRAWN AEROBIC AND ANAEROBIC Blood Culture adequate volume Performed at [facility], [address], [place], [place] [address]    Culture   Final    NO GROWTH 2 DAYS Performed at [facility], [address], [place], [place] [address]    Report Status PENDING  Incomplete         Radiology Studies: No results found.       Scheduled Meds:  cefadroxil  1,000 mg Oral BID   feeding supplement  237 mL Oral BID BM   heparin  injection (subcutaneous)  5,000 Units Subcutaneous Q8H   melatonin  3 mg Oral QHS   [START ON 12/21/2023] metoprolol  succinate  50 mg Oral Daily   mirabegron ER  25 mg Oral Daily    pantoprazole   40 mg Oral BID   QUEtiapine   25 mg Oral BID   Continuous Infusions:     LOS: 2 days    Time spent: 52 minutes spent on 12/20/2023 caring for this patient face-to-face including chart review, ordering labs/tests, documenting, discussion with nursing staff, consultants, updating family  and interview/physical exam    Camellia PARAS Draven Natter, DO [facility] Available via Epic secure chat 7am-7pm After these hours, please refer to coverage provider listed on [email or web address] 12/20/2023, 11:52 AM

## 2023-12-20 NOTE — Progress Notes (Signed)
 PT Cancellation Note  Patient Details Name: Monica Conway MRN: [id number] DOB: 04-19-1945   Cancelled Treatment:     Reason Eval/Treat Not Completed: Medical issues which prohibited therapy a fib with HR up to 160s. Will hold therapy attempts at this time    MILLICENT SALES 12/20/2023, 12:27 PM Sales, PT, MPT Acute Rehabilitation Services Office: [phone] If a weekend: secure chat groups: [facility] PT, [facility] OT, [facility] SLP 12/20/2023

## 2023-12-21 DIAGNOSIS — A419 Sepsis, unspecified organism: Secondary | ICD-10-CM | POA: Diagnosis not present

## 2023-12-21 DIAGNOSIS — N390 Urinary tract infection, site not specified: Secondary | ICD-10-CM | POA: Diagnosis not present

## 2023-12-21 LAB — COMPREHENSIVE METABOLIC PANEL WITH GFR
ALT: 29 U/L (ref 0–44)
AST: 49 U/L — ABNORMAL HIGH (ref 15–41)
Albumin: 2.9 g/dL — ABNORMAL LOW (ref 3.5–5.0)
Alkaline Phosphatase: 169 U/L — ABNORMAL HIGH (ref 38–126)
Anion gap: 12 (ref 5–15)
BUN: 13 mg/dL (ref 8–23)
CO2: 15 mmol/L — ABNORMAL LOW (ref 22–32)
Calcium: 8.4 mg/dL — ABNORMAL LOW (ref 8.9–10.3)
Chloride: 117 mmol/L — ABNORMAL HIGH (ref 98–111)
Creatinine, Ser: 0.76 mg/dL (ref 0.44–1.00)
GFR, Estimated: >60 mL/min (ref >60)
Glucose, Bld: 109 mg/dL — ABNORMAL HIGH (ref 70–99)
Potassium: 4 mmol/L (ref 3.5–5.1)
Sodium: 144 mmol/L (ref 135–145)
Total Bilirubin: 0.4 mg/dL (ref 0.0–1.2)
Total Protein: 6.4 g/dL — ABNORMAL LOW (ref 6.5–8.1)

## 2023-12-21 LAB — MAGNESIUM: Magnesium: 1.5 mg/dL — ABNORMAL LOW (ref 1.7–2.4)

## 2023-12-21 LAB — CBC
HCT: 36.7 % (ref 36.0–46.0)
Hemoglobin: 11.7 g/dL — ABNORMAL LOW (ref 12.0–15.0)
MCH: 31 pg (ref 26.0–34.0)
MCHC: 31.9 g/dL (ref 30.0–36.0)
MCV: 97.1 fL (ref 80.0–100.0)
Platelets: 302 K/uL (ref 150–400)
RBC: 3.78 MIL/uL — ABNORMAL LOW (ref 3.87–5.11)
RDW: 19.1 % — ABNORMAL HIGH (ref 11.5–15.5)
WBC: 10.8 K/uL — ABNORMAL HIGH (ref 4.0–10.5)
nRBC: 0 % (ref 0.0–0.2)

## 2023-12-21 MED ORDER — CEFADROXIL 500 MG PO CAPS
500.0000 mg | ORAL_CAPSULE | Freq: Two times a day (BID) | ORAL | Status: AC
  Administered 2023-12-22 (×2): 500 mg via ORAL
  Filled 2023-12-21 (×3): qty 1

## 2023-12-21 MED ORDER — DILTIAZEM HCL 60 MG PO TABS
60.0000 mg | ORAL_TABLET | Freq: Three times a day (TID) | ORAL | Status: DC
  Administered 2023-12-21 – 2023-12-22 (×3): 60 mg via ORAL
  Filled 2023-12-21 (×4): qty 1

## 2023-12-21 NOTE — Evaluation (Signed)
 Physical Therapy Evaluation Patient Details Name: Monica Conway MRN: [id number] DOB: 08-Jan-1946 Today's Date: 12/21/2023  History of Present Illness  Monica Conway is a 78 y.o. female admitted  [facility] ED on 12/18/2023 from home via EMS with confusion, Acute metabolic encephalopathy,   E. coli/Proteus urinary tract infection  PMH includes L 5 compression fx (10/25/23), prior CVA, bilateral knee severe arthritis, hypertension, A-fib no longer on Eliquis  due to GI bleed  Clinical Impression  Pt admitted with above diagnosis. Evaluation very limited due to patient's cognition, lethargy, and lack of willingness to work with therapy.  Pt had received Haldol earlier this morning per RN.  Pt required total A x 2 for transfer to EOB, she was more alert but declined to participate further with PT/OT  Returned to bed and appeared comfortable and to be returning to sleep.  Pt not able to provide hx but at last admission was ambulatory and admitted from home.  Pt appears to be below baseline and could progress well if cognition clears.   Pt currently with functional limitations due to the deficits listed below (see PT Problem List). Pt will benefit from acute skilled PT to increase their independence and safety with mobility to allow discharge.  Patient will benefit from continued inpatient follow up therapy, <3 hours/day at d/c.          If plan is discharge home, recommend the following: Two people to help with walking and/or transfers;Two people to help with bathing/dressing/bathroom   Can travel by private vehicle   No    Equipment Recommendations Wheelchair cushion (measurements PT);Wheelchair (measurements PT)  Recommendations for Other Services       Functional Status Assessment Patient has had a recent decline in their functional status and demonstrates the ability to make significant improvements in function in a reasonable and predictable amount of time.     Precautions / Restrictions  Precautions Precautions: Fall      Mobility  Bed Mobility Overal bed mobility: Needs Assistance Bed Mobility: Supine to Sit, Sit to Supine     Supine to sit: Total assist, +2 for physical assistance Sit to supine: +2 for physical assistance, Total assist   General bed mobility comments: Total x 2 today due to lethargy - transferred pt to EOB hoping to improve alertness and ability to participate but pt declining any movement and asking to be left alone    Transfers                   General transfer comment: unable    Ambulation/Gait                  Stairs            Wheelchair Mobility     Tilt Bed    Modified Rankin (Stroke Patients Only)       Balance Overall balance assessment: Needs assistance Sitting-balance support: Feet unsupported, No upper extremity supported Sitting balance-Leahy Scale: Fair Sitting balance - Comments: Could maintain static balance once at EOB, only at EOB for 3-5 mins                                     Pertinent Vitals/Pain Pain Assessment Pain Assessment: Faces Faces Pain Scale: Hurts a little bit Pain Location: all over Pain Descriptors / Indicators: Discomfort, Grimacing Pain Intervention(s): Limited activity within patient's tolerance, Monitored during session, Repositioned  Home Living Family/patient expects to be discharged to:: Private residence Living Arrangements: Alone Available Help at Discharge: Family;Available PRN/intermittently;Neighbor Type of Home: House Home Access: Ramped entrance       Home Layout: One level Home Equipment: Agricultural Consultant (2 wheels);Wheelchair - manual;Shower seat;Grab bars - tub/shower;Hand held shower head Additional Comments: above info per recent admission 1 month    Prior Function Prior Level of Function : Patient poor historian/Family not available             Mobility Comments: Pt unable to provide hx: per this admission has  caregiver who leaves at night, prior admission was using RW or w/c depending on distance ADLs Comments: Per prior admission: independent; has someone that helps her with groceries     Extremity/Trunk Assessment   Upper Extremity Assessment Upper Extremity Assessment: Defer to OT evaluation    Lower Extremity Assessment Lower Extremity Assessment: Generalized weakness (Pt refusing to attempt any active movements of leg or feet, no contractures noted)    Cervical / Trunk Assessment Cervical / Trunk Assessment: Kyphotic  Communication        Cognition Arousal: Lethargic Behavior During Therapy: Agitated, Anxious   PT - Cognitive impairments: No family/caregiver present to determine baseline, Orientation, Awareness, Safety/Judgement, Problem solving, Sequencing, Initiation, Attention, Memory                       PT - Cognition Comments: Pt sleeping at arrival - nursing reports giving haldol earlier today.  Pt not able to answer therapy questions, frequently states leave me alone, easily agitated with questions/request, not following commands Following commands: Impaired       Cueing       General Comments      Exercises     Assessment/Plan    PT Assessment Patient needs continued PT services  PT Problem List Decreased strength;Decreased mobility;Decreased safety awareness;Decreased activity tolerance;Decreased balance;Decreased cognition;Decreased knowledge of use of DME;Pain;Decreased coordination       PT Treatment Interventions DME instruction;Therapeutic exercise;Gait training;Therapeutic activities;Patient/family education;Neuromuscular re-education;Cognitive remediation;Functional mobility training;Balance training    PT Goals (Current goals can be found in the Care Plan section)  Acute Rehab PT Goals Patient Stated Goal: leave me alone PT Goal Formulation: Patient unable to participate in goal setting Time For Goal Achievement: 01/04/24 Potential  to Achieve Goals: Good    Frequency Min 2X/week     Co-evaluation PT/OT/SLP Co-Evaluation/Treatment: Yes Reason for Co-Treatment: For patient/therapist safety;Complexity of the patient's impairments (multi-system involvement);Necessary to address cognition/behavior during functional activity PT goals addressed during session: Mobility/safety with mobility OT goals addressed during session: ADL's and self-care       AM-PAC PT 6 Clicks Mobility  Outcome Measure Help needed turning from your back to your side while in a flat bed without using bedrails?: Total Help needed moving from lying on your back to sitting on the side of a flat bed without using bedrails?: Total Help needed moving to and from a bed to a chair (including a wheelchair)?: Total Help needed standing up from a chair using your arms (e.g., wheelchair or bedside chair)?: Total Help needed to walk in hospital room?: Total Help needed climbing 3-5 steps with a railing? : Total 6 Click Score: 6    End of Session   Activity Tolerance: Other (comment) (LImited due to lethargy and cognition) Patient left: in bed;with call bell/phone within reach;with bed alarm set;Other (comment) (pt curled up on L side, pillows to support, R mit in place ,  L mit was laying in chair at arrival, left off)   PT Visit Diagnosis: Other abnormalities of gait and mobility (R26.89);Muscle weakness (generalized) (M62.81)    Time: 8640-8590 PT Time Calculation (min) (ACUTE ONLY): 10 min   Charges:   PT Evaluation $PT Eval Low Complexity: 1 Low   PT General Charges $$ ACUTE PT VISIT: 1 Visit         Benjiman, PT Acute Rehab [facility] [phone]   Benjiman VEAR Mulberry 12/21/2023, 3:12 PM

## 2023-12-21 NOTE — Progress Notes (Signed)
 PROGRESS NOTE    Monica Conway  FMW:[id number] DOB: 1945/12/16 DOA: 12/18/2023 PCP: [facility]    Brief Narrative:   Monica Conway is a 78 y.o. 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  Patient lives at home with assistance of caregivers, who left last night as they usually do.  She then called 911 stating that she was being kidnapped in her own house.  Patient was recently treated with oral antibiotics for UTI outpatient.  In the ED, temperature 97.9 F, HR 110, RR 36, BP 155/116, SpO2 97% on room air.  WBC 16.0, hemoglobin 13.6, platelet count 336.  Sodium 139, potassium 3.6, chloride 110, CO2 12, glucose 60, BUN 39, creatinine 1.44.  AST 75, ALT 32, total bilirubin 0.7.  Lactic acid 1.2.  Urinalysis with large leukocytes, negative nitrite, many bacteria, greater than 50 WBCs.  CT head without contrast with no acute intracranial abnormality.  Assessment & Plan:   Acute metabolic encephalopathy, POA E. coli/Proteus urinary tract infection Patient presenting to the ED with confusion.  Patient afebrile.  Elevated WBC count of 16.0 and urinalysis consistent with UTI. -- Urine culture: + >100K proteus mirabilis and >30K Ecoli -- Blood cultures: No growth x 2 days -- Cefadroxil 500 g p.o. twice daily  Paroxysmal atrial fibrillation with RVR HTN At time of presentation to the ED patient was noted to be tachycardic in A-fib with RVR.  On metoprolol  succinate 25 mg p.o. daily outpatient.  No longer on anticoagulation due to history of GI bleed/gastric ulcer.  Initially started on a Cardizem  drip with improvement now discontinued. -- Metoprolol  succinate 50 mg p.o.  daily -- Start Cardizem  60 mg p.o. every 8 hours -- Discontinue IV Cardizem  today -- Hold home amlodipine  for now -- Continue monitor on telemetry  Hypokalemia Repleted, potassium 4.0 today. -- Repeat electrolytes in a.m.  Hx gastric ulcer/upper GI bleed -- Protonix  40 m p.o. twice daily  History of CVA Patient with previous history of right basal ganglia CVA 01/2023, no focal deficits.  CT head on admission with no acute intracranial normality, noted chronic right PCA and right basal infarcts.  Cognitive impairment -- Delirium precautions -- Get up during the day -- Encourage a familiar face to remain present throughout the day -- Keep blinds open and lights on during daylight hours -- Minimize the use of opioids/benzodiazepines -- Seroquel  25 mg PO BID -- Melatonin 3 mg pO qHS -- Haldol 5mg  IV q6h PRN agitation  Weakness/ability/deconditioning: -- PT/OT evaluation: Pending  DVT prophylaxis: heparin  injection 5,000 Units Start: 12/18/23 1400    Code Status: Limited: Do not attempt resuscitation (DNR) -DNR-LIMITED -Do Not Intubate/DNI  Family Communication:   Disposition Plan:  Level of care: Progressive Status is: Inpatient Remains inpatient appropriate because: Awaiting PT/OT evaluation    Consultants:  None  Procedures:  None  Antimicrobials:  Ceftriaxone  10/24 - 10/26 Cefadroxil 10/26>>   Subjective: Patient seen examined at bedside, lying in bed.  RN present at bedside.  Received IV Haldol overnight.  Starting oral Cardizem , weaning off of IV Cardizem  today.  Awaiting PT/OT evaluation.  Patient without complaints, remains pleasantly confused..  No family present at bedside.  Patient denies headache, no chest pain, no shortness of breath,  no abdominal pain, no generalized discomfort.  No other concerns overnight per nursing staff.   Objective: Vitals:   12/21/23 0000 12/21/23 0212 12/21/23 0400 12/21/23 0800  BP: 136/83 106/63 (!) 132/94 (!) 146/71   Pulse:      Resp: 18 20 16    Temp:  98.6 F (37 C) 98.7 F (37.1 C)   TempSrc:  Axillary Axillary   SpO2:  100% 99%   Weight:      Height:        Intake/Output Summary (Last 24 hours) at 12/21/2023 1224 Last data filed at 12/21/2023 1100 Gross per 24 hour  Intake 3059.15 ml  Output 700 ml  Net 2359.15 ml   Filed Weights   12/18/23 0559 12/18/23 1625  Weight: 54.6 kg 41.4 kg    Examination:  Physical Exam: GEN: NAD, alert, pleasantly confused, elderly in appearance HEENT: NCAT, PERRL, EOMI, sclera clear, MMM PULM: CTAB w/o wheezes/crackles, normal respiratory effort, on room air CV: Irregularly irregular rhythm, normal rate w/o M/G/R GI: abd soft, NTND, + BS MSK: no peripheral edema, moves all extremities independently NEURO: No focal neurological deficit PSYCH: normal mood/affect Integumentary: No concerning rashes/lesions/wounds none exposed skin surfaces    Data Reviewed: I have personally reviewed following labs and imaging studies  CBC: Recent Labs  Lab 12/18/23 0712 12/19/23 0502 12/20/23 0430 12/21/23 0405  WBC 16.0* 10.9* 12.1* 10.8*  NEUTROABS 13.6*  --   --   --   HGB 13.6 11.4* 12.0 11.7*  HCT 43.1 37.6 36.7 36.7  MCV 96.4 100.3* 95.6 97.1  PLT 336 238 269 302   Basic Metabolic Panel: Recent Labs  Lab 12/18/23 0712 12/19/23 0502 12/20/23 0430 12/21/23 0405  NA 139 139 140 144  K 3.6 3.2* 4.0 4.0  CL 110 112* 114* 117*  CO2 12* 12* 14* 15*  GLUCOSE 60* 66* 96 109*  BUN 39* 27* 17 13  CREATININE 1.44* 1.19* 0.97 0.76  CALCIUM  8.6* 8.3* 8.7* 8.4*  MG  --   --  1.8 1.5*  PHOS  --   --  2.2*  --    GFR: Estimated Creatinine Clearance: 38.5 mL/min (by C-G formula based on SCr of 0.76 mg/dL). Liver Function Tests: Recent Labs  Lab 12/18/23 9287 12/19/23 0502 12/21/23 0405  AST 75* 55* 49*  ALT 32 24 29  ALKPHOS 223* 166* 169*  BILITOT 0.7 0.5 0.4  PROT 6.9 6.1* 6.4*  ALBUMIN 3.1* 2.7* 2.9*   No results for input(s): LIPASE,  AMYLASE in the last 168 hours. No results for input(s): AMMONIA in the last 168 hours. Coagulation Profile: Recent Labs  Lab 12/18/23 0712  INR 1.2   Cardiac Enzymes: No results for input(s): CKTOTAL, CKMB, CKMBINDEX, TROPONINI in the last 168 hours. BNP (last 3 results) No results for input(s): PROBNP in the last 8760 hours. HbA1C: No results for input(s): HGBA1C in the last 72 hours. CBG: No results for input(s): GLUCAP in the last 168 hours. Lipid Profile: No results for input(s): CHOL, HDL, LDLCALC, TRIG, CHOLHDL, LDLDIRECT in the last 72 hours. Thyroid Function Tests: No results for input(s): TSH, T4TOTAL, FREET4, T3FREE, THYROIDAB in the last 72 hours. Anemia Panel: No results for input(s): VITAMINB12, FOLATE, FERRITIN, TIBC, IRON , RETICCTPCT in the last 72 hours. Sepsis Labs: Recent Labs  Lab 12/18/23 0708 12/18/23 0912  LATICACIDVEN 1.2 1.0    Recent Results (from the past 240 hours)  Urine Culture     Status: Abnormal   Collection Time: 12/18/23  6:13  AM   Specimen: Urine, Random  Result Value Ref Range Status   Specimen Description   Final    URINE, RANDOM Performed at [facility], [address], [place], [place] [address]    Special Requests   Final    NONE Reflexed from [phone] Performed at [facility], [address], [place], [place] [address]    Culture (A)  Final    >=100,000 COLONIES/mL PROTEUS MIRABILIS 30,000 COLONIES/mL ESCHERICHIA COLI    Report Status 12/20/2023 FINAL  Final   Organism ID, Bacteria PROTEUS MIRABILIS (A)  Final   Organism ID, Bacteria ESCHERICHIA COLI (A)  Final      Susceptibility   Escherichia coli - MIC*    AMPICILLIN >=32 RESISTANT Resistant     CEFAZOLIN  (URINE) Value in next row Sensitive      2 SENSITIVEThis is a modified FDA-approved test that has been validated and its performance characteristics determined by the reporting  laboratory.  This laboratory is certified under the Clinical Laboratory Improvement Amendments CLIA as qualified to perform high complexity clinical laboratory testing.    CEFEPIME  Value in next row Sensitive      2 SENSITIVEThis is a modified FDA-approved test that has been validated and its performance characteristics determined by the reporting laboratory.  This laboratory is certified under the Clinical Laboratory Improvement Amendments CLIA as qualified to perform high complexity clinical laboratory testing.    ERTAPENEM Value in next row Sensitive      2 SENSITIVEThis is a modified FDA-approved test that has been validated and its performance characteristics determined by the reporting laboratory.  This laboratory is certified under the Clinical Laboratory Improvement Amendments CLIA as qualified to perform high complexity clinical laboratory testing.    CEFTRIAXONE  Value in next row Sensitive      2 SENSITIVEThis is a modified FDA-approved test that has been validated and its performance characteristics determined by the reporting laboratory.  This laboratory is certified under the Clinical Laboratory Improvement Amendments CLIA as qualified to perform high complexity clinical laboratory testing.    CIPROFLOXACIN Value in next row Sensitive      2 SENSITIVEThis is a modified FDA-approved test that has been validated and its performance characteristics determined by the reporting laboratory.  This laboratory is certified under the Clinical Laboratory Improvement Amendments CLIA as qualified to perform high complexity clinical laboratory testing.    GENTAMICIN Value in next row Sensitive      2 SENSITIVEThis is a modified FDA-approved test that has been validated and its performance characteristics determined by the reporting laboratory.  This laboratory is certified under the Clinical Laboratory Improvement Amendments CLIA as qualified to perform high complexity clinical laboratory testing.     NITROFURANTOIN Value in next row Sensitive      2 SENSITIVEThis is a modified FDA-approved test that has been validated and its performance characteristics determined by the reporting laboratory.  This laboratory is certified under the Clinical Laboratory Improvement Amendments CLIA as qualified to perform high complexity clinical laboratory testing.    TRIMETH/SULFA Value in next row Sensitive      2 SENSITIVEThis is a modified FDA-approved test that has been validated and its performance characteristics determined by the reporting laboratory.  This laboratory is certified under the Clinical Laboratory Improvement Amendments CLIA as qualified to perform high complexity clinical laboratory testing.    AMPICILLIN/SULBACTAM Value in next row Intermediate      2 SENSITIVEThis is a modified FDA-approved test that has been validated  and its performance characteristics determined by the reporting laboratory.  This laboratory is certified under the Clinical Laboratory Improvement Amendments CLIA as qualified to perform high complexity clinical laboratory testing.    PIP/TAZO Value in next row Sensitive      <=4 SENSITIVEThis is a modified FDA-approved test that has been validated and its performance characteristics determined by the reporting laboratory.  This laboratory is certified under the Clinical Laboratory Improvement Amendments CLIA as qualified to perform high complexity clinical laboratory testing.    MEROPENEM Value in next row Sensitive      <=4 SENSITIVEThis is a modified FDA-approved test that has been validated and its performance characteristics determined by the reporting laboratory.  This laboratory is certified under the Clinical Laboratory Improvement Amendments CLIA as qualified to perform high complexity clinical laboratory testing.    * 30,000 COLONIES/mL ESCHERICHIA COLI   Proteus mirabilis - MIC*    AMPICILLIN Value in next row Sensitive      <=4 SENSITIVEThis is a modified  FDA-approved test that has been validated and its performance characteristics determined by the reporting laboratory.  This laboratory is certified under the Clinical Laboratory Improvement Amendments CLIA as qualified to perform high complexity clinical laboratory testing.    CEFAZOLIN  (URINE) Value in next row Sensitive      4 SENSITIVEThis is a modified FDA-approved test that has been validated and its performance characteristics determined by the reporting laboratory.  This laboratory is certified under the Clinical Laboratory Improvement Amendments CLIA as qualified to perform high complexity clinical laboratory testing.    CEFEPIME  Value in next row Sensitive      4 SENSITIVEThis is a modified FDA-approved test that has been validated and its performance characteristics determined by the reporting laboratory.  This laboratory is certified under the Clinical Laboratory Improvement Amendments CLIA as qualified to perform high complexity clinical laboratory testing.    ERTAPENEM Value in next row Sensitive      4 SENSITIVEThis is a modified FDA-approved test that has been validated and its performance characteristics determined by the reporting laboratory.  This laboratory is certified under the Clinical Laboratory Improvement Amendments CLIA as qualified to perform high complexity clinical laboratory testing.    CEFTRIAXONE  Value in next row Sensitive      4 SENSITIVEThis is a modified FDA-approved test that has been validated and its performance characteristics determined by the reporting laboratory.  This laboratory is certified under the Clinical Laboratory Improvement Amendments CLIA as qualified to perform high complexity clinical laboratory testing.    CIPROFLOXACIN Value in next row Resistant      4 SENSITIVEThis is a modified FDA-approved test that has been validated and its performance characteristics determined by the reporting laboratory.  This laboratory is certified under the Clinical  Laboratory Improvement Amendments CLIA as qualified to perform high complexity clinical laboratory testing.    GENTAMICIN Value in next row Sensitive      4 SENSITIVEThis is a modified FDA-approved test that has been validated and its performance characteristics determined by the reporting laboratory.  This laboratory is certified under the Clinical Laboratory Improvement Amendments CLIA as qualified to perform high complexity clinical laboratory testing.    NITROFURANTOIN Value in next row Resistant      4 SENSITIVEThis is a modified FDA-approved test that has been validated and its performance characteristics determined by the reporting laboratory.  This laboratory is certified under the Clinical Laboratory Improvement Amendments CLIA as qualified to perform high complexity clinical laboratory testing.  TRIMETH/SULFA Value in next row Sensitive      4 SENSITIVEThis is a modified FDA-approved test that has been validated and its performance characteristics determined by the reporting laboratory.  This laboratory is certified under the Clinical Laboratory Improvement Amendments CLIA as qualified to perform high complexity clinical laboratory testing.    AMPICILLIN/SULBACTAM Value in next row Sensitive      4 SENSITIVEThis is a modified FDA-approved test that has been validated and its performance characteristics determined by the reporting laboratory.  This laboratory is certified under the Clinical Laboratory Improvement Amendments CLIA as qualified to perform high complexity clinical laboratory testing.    PIP/TAZO Value in next row Sensitive      <=4 SENSITIVEThis is a modified FDA-approved test that has been validated and its performance characteristics determined by the reporting laboratory.  This laboratory is certified under the Clinical Laboratory Improvement Amendments CLIA as qualified to perform high complexity clinical laboratory testing.    MEROPENEM Value in next row Sensitive      <=4  SENSITIVEThis is a modified FDA-approved test that has been validated and its performance characteristics determined by the reporting laboratory.  This laboratory is certified under the Clinical Laboratory Improvement Amendments CLIA as qualified to perform high complexity clinical laboratory testing.    * >=100,000 COLONIES/mL PROTEUS MIRABILIS  Culture, blood (Routine x 2)     Status: None (Preliminary result)   Collection Time: 12/18/23  7:11 AM   Specimen: BLOOD  Result Value Ref Range Status   Specimen Description   Final    BLOOD RIGHT ANTECUBITAL Performed at [facility], [address] [place], [place] [address]    Special Requests   Final    BOTTLES DRAWN AEROBIC AND ANAEROBIC Blood Culture adequate volume Performed at [facility], [address], [place], [place] [address]    Culture   Final    NO GROWTH 3 DAYS Performed at [facility], [address], [place], [place] [address]    Report Status PENDING  Incomplete  Culture, blood (Routine x 2)     Status: None (Preliminary result)   Collection Time: 12/18/23  9:01 AM   Specimen: BLOOD  Result Value Ref Range Status   Specimen Description   Final    BLOOD LEFT ANTECUBITAL Performed at [facility], [address], [place], [place] [address]    Special Requests   Final    BOTTLES DRAWN AEROBIC AND ANAEROBIC Blood Culture adequate volume Performed at [facility], [address], [place], [place] [address]    Culture   Final    NO GROWTH 3 DAYS Performed at [facility], [address] [place], [place] [address]    Report Status PENDING  Incomplete         Radiology Studies: No results found.       Scheduled Meds:  cefadroxil  500 mg Oral BID   diltiazem   60 mg Oral Q8H   feeding supplement  237 mL Oral BID BM   heparin  injection (subcutaneous)  5,000 Units Subcutaneous Q8H   melatonin  3 mg Oral QHS    metoprolol  succinate  50 mg Oral Daily   mirabegron ER  25 mg Oral Daily   pantoprazole   40 mg Oral BID   QUEtiapine   25 mg Oral BID   Continuous Infusions:  sodium chloride  Stopped (12/21/23 0940)   diltiazem  (CARDIZEM ) infusion Stopped (12/21/23 0839)  LOS: 3 days    Time spent: 52 minutes spent on 12/21/2023 caring for this patient face-to-face including chart review, ordering labs/tests, documenting, discussion with nursing staff, consultants, updating family and interview/physical exam    Camellia PARAS Alpha Chouinard, DO [facility] Available via Epic secure chat 7am-7pm After these hours, please refer to coverage provider listed on [email or web address] 12/21/2023, 12:24 PM

## 2023-12-21 NOTE — Progress Notes (Signed)
 PT Cancellation Note  Patient Details Name: Monica Conway MRN: [id number] DOB: [birth year]   Cancelled Treatment:    Reason Eval/Treat Not Completed: Other (comment) RN reports pt has been pulling at lines/leads and restless all morning.  Recently, received Haldol and is resting.  Will plan to f/u in afternoon.  Benjiman, PT Acute Rehab [facility] [phone]   Benjiman VEAR [place] 12/21/2023, 12:33 PM

## 2023-12-21 NOTE — Plan of Care (Signed)

## 2023-12-21 NOTE — Evaluation (Signed)
 Occupational Therapy Evaluation Patient Details Name: Monica Conway MRN: [id number] DOB: 05/20/1945 Today's Date: 12/21/2023   History of Present Illness   Monica Conway is a 78 y.o. female admitted  [facility] ED on 12/18/2023 from home via EMS with confusion, Acute metabolic encephalopathy,   E. coli/Proteus urinary tract infection  PMH includes L 5 compression fx (10/25/23), prior CVA, bilateral knee severe arthritis, hypertension, A-fib no longer on Eliquis  due to GI bleed     Clinical Impressions PTA, patient lives at home with caregivers assisting however due to patient with limited ability to provide history at eval and no family bedside, obtained info from chart. Currently, patient presents with deficits outlined below (see OT Problem List for details) most significantly pain, decreased cognition, balance, activity tolerance and generalized muscle weakness limiting BADL's (max-total A) and functional mobility (+2 EOB only total assist) performance. Patient will benefit from continued inpatient follow up therapy, <3 hours/day. Patient requires continued Acute care hospital level OT services to progress safety and functional performance and allow for discharge.       If plan is discharge home, recommend the following:   Two people to help with bathing/dressing/bathroom;Two people to help with walking and/or transfers;Assistance with cooking/housework;Assistance with feeding;Direct supervision/assist for medications management;Direct supervision/assist for financial management;Help with stairs or ramp for entrance;Assist for transportation;Supervision due to cognitive status     Functional Status Assessment   Patient has had a recent decline in their functional status and demonstrates the ability to make significant improvements in function in a reasonable and predictable amount of time.     Equipment Recommendations   Hospital bed;Hoyer lift      Precautions/Restrictions    Precautions Precautions: Fall Restrictions Weight Bearing Restrictions Per Provider Order: No     Mobility Bed Mobility Overal bed mobility: Needs Assistance Bed Mobility: Supine to Sit, Sit to Supine     Supine to sit: Total assist, +2 for physical assistance Sit to supine: +2 for physical assistance, Total assist   General bed mobility comments: Total x 2 today due to lethargy - transferred pt to EOB hoping to improve alertness and ability to participate but pt declining any movement and asking to be left alone    Transfers                   General transfer comment: unable      Balance Overall balance assessment: Needs assistance Sitting-balance support: Feet unsupported, No upper extremity supported Sitting balance-Leahy Scale: Fair Sitting balance - Comments: Could maintain static balance once at EOB, only at EOB for 3-5 mins                                   ADL either performed or assessed with clinical judgement   ADL Overall ADL's : Needs assistance/impaired Eating/Feeding:  (poor LOA/attention to trial PO this session but did trial sip from straw with max A to hold cup wiht no overt coughing)   Grooming: Wash/dry hands;Wash/dry face;Total assistance;Sitting Grooming Details (indicate cue type and reason): +2 EOB Upper Body Bathing: Total assistance   Lower Body Bathing: Total assistance;Bed level   Upper Body Dressing : Total assistance;Bed level   Lower Body Dressing: Total assistance;Bed level   Toilet Transfer:  (using Purewick and only able to come to EOB with + 2)           Functional mobility during ADLs: Total assistance;+2  for physical assistance;+2 for safety/equipment;Cueing for safety;Cueing for sequencing (EOB only) General ADL Comments: unable to follwo commands this session to progress from above simple tasks     Vision Patient Visual Report: Other (comment) (unable to assess to LOA) Additional Comments:  maintained eyes closed most of session            Pertinent Vitals/Pain Pain Assessment Pain Assessment: Faces Faces Pain Scale: Hurts a little bit Breathing: noisy labored breathing, long periods of hyperventilation, Cheyne-Stokes respirations Negative Vocalization: occasional moan/groan, low speech, negative/disapproving quality Facial Expression: facial grimacing Body Language: tense, distressed pacing, fidgeting Consolability: distracted or reassured by voice/touch PAINAD Score: 7 Pain Location: all over Pain Descriptors / Indicators: Discomfort, Grimacing Pain Intervention(s): Limited activity within patient's tolerance, Monitored during session, Premedicated before session, Repositioned, Relaxation     Extremity/Trunk Assessment Upper Extremity Assessment Upper Extremity Assessment: Generalized weakness;Difficult to assess due to impaired cognition (was able to move both UE's against gravity and hold wash cloth in both)   Lower Extremity Assessment Lower Extremity Assessment: Defer to PT evaluation   Cervical / Trunk Assessment Cervical / Trunk Assessment: Kyphotic   Communication Communication Communication: Impaired Factors Affecting Communication: Hearing impaired   Cognition Arousal: Lethargic Behavior During Therapy: Agitated, Anxious Cognition: Cognition impaired   Orientation impairments: Person, Place, Time, Situation Awareness: Intellectual awareness intact, Online awareness impaired Memory impairment (select all impairments): Short-term memory Attention impairment (select first level of impairment): Focused attention Executive functioning impairment (select all impairments): Initiation, Organization, Sequencing, Reasoning, Problem solving OT - Cognition Comments: maintained eyes closed for session, was given meds for calming earlier and has mitts on for pulling on lines and IV's, was unable to engage in more than EOB with +2 this sesison with limited  commands due to report of pain and fatigue, will continue to assess                 Following commands: Impaired Following commands impaired:  (impaired for 1 step this session)     Cueing  General Comments   Cueing Techniques: Verbal cues;Gestural cues;Tactile cues;Visual cues  minor skin discolorations noted, otherwise no edema, no SOB this session           Home Living Family/patient expects to be discharged to:: Private residence Living Arrangements: Alone Available Help at Discharge: Family;Available PRN/intermittently;Neighbor Type of Home: House Home Access: Ramped entrance     Home Layout: One level     Bathroom Shower/Tub: Chief Strategy Officer: Handicapped height Bathroom Accessibility: Yes   Home Equipment: Agricultural Consultant (2 wheels);Wheelchair - manual;Shower seat;Grab bars - tub/shower;Hand held shower head   Additional Comments: above info per recent admission 1 month      Prior Functioning/Environment Prior Level of Function : Patient poor historian/Family not available             Mobility Comments: Pt unable to provide hx: per this admission has caregiver who leaves at night, prior admission was using RW or w/c depending on distance ADLs Comments: Per prior admission: independent; has someone that helps her with groceries    OT Problem List: Decreased strength;Decreased activity tolerance;Impaired balance (sitting and/or standing);Decreased coordination;Decreased cognition;Decreased safety awareness;Decreased knowledge of use of DME or AE;Decreased knowledge of precautions;Cardiopulmonary status limiting activity;Pain   OT Treatment/Interventions: Self-care/ADL training;Therapeutic exercise;Neuromuscular education;Energy conservation;DME and/or AE instruction;Therapeutic activities;Cognitive remediation/compensation;Visual/perceptual remediation/compensation;Patient/family education;Balance training      OT Goals(Current goals  can be found in the care plan section)   Acute Rehab OT Goals  Patient Stated Goal: unable to state OT Goal Formulation: Patient unable to participate in goal setting Time For Goal Achievement: 01/04/24 Potential to Achieve Goals: Fair ADL Goals Pt Will Perform Eating: with supervision;sitting Pt Will Perform Grooming: with min assist;sitting Pt Will Perform Upper Body Bathing: with mod assist;sitting Pt Will Perform Upper Body Dressing: with mod assist;sitting Pt Will Transfer to Toilet: with +2 assist;with mod assist;bedside commode;stand pivot transfer Pt Will Perform Toileting - Clothing Manipulation and hygiene: with max assist;sitting/lateral leans   OT Frequency:  Min 2X/week    Co-evaluation   Reason for Co-Treatment: For patient/therapist safety;Complexity of the patient's impairments (multi-system involvement);Necessary to address cognition/behavior during functional activity PT goals addressed during session: Mobility/safety with mobility OT goals addressed during session: ADL's and self-care      AM-PAC OT 6 Clicks Daily Activity     Outcome Measure Help from another person eating meals?: A Lot Help from another person taking care of personal grooming?: A Lot Help from another person toileting, which includes using toliet, bedpan, or urinal?: Total Help from another person bathing (including washing, rinsing, drying)?: Total Help from another person to put on and taking off regular upper body clothing?: Total Help from another person to put on and taking off regular lower body clothing?: Total 6 Click Score: 8   End of Session Equipment Utilized During Treatment: Gait belt Nurse Communication: Mobility status  Activity Tolerance: Patient limited by fatigue;Patient limited by lethargy;Patient limited by pain;Treatment limited secondary to agitation Patient left: in bed;with call bell/phone within reach;with bed alarm set;with restraints reapplied (R mitt on hand was  on when therapy initiated and replaced)  OT Visit Diagnosis: Unsteadiness on feet (R26.81);Other abnormalities of gait and mobility (R26.89);Muscle weakness (generalized) (M62.81);Cognitive communication deficit (R41.841);Pain Pain - part of body:  (all over)                Time: 1359-1409 OT Time Calculation (min): 10 min Charges:  OT General Charges $OT Visit: 1 Visit OT Evaluation $OT Eval Low Complexity: 1 Low Chrishon Martino OT/L Acute Rehabilitation Department  [phone]  12/21/2023, 5:00 PM

## 2023-12-21 NOTE — Plan of Care (Signed)
  Problem: Health Behavior/Discharge Planning: Goal: Ability to manage health-related needs will improve Outcome: Progressing   Problem: Clinical Measurements: Goal: Ability to maintain clinical measurements within normal limits will improve Outcome: Progressing Goal: Will remain free from infection Outcome: Progressing Goal: Diagnostic test results will improve Outcome: Progressing Goal: Respiratory complications will improve Outcome: Progressing Goal: Cardiovascular complication will be avoided Outcome: Progressing   Problem: Activity: Goal: Risk for activity intolerance will decrease Outcome: Progressing   Problem: Nutrition: Goal: Adequate nutrition will be maintained Outcome: Progressing   Problem: Elimination: Goal: Will not experience complications related to bowel motility Outcome: Progressing Goal: Will not experience complications related to urinary retention Outcome: Progressing   Problem: Pain Managment: Goal: General experience of comfort will improve and/or be controlled Outcome: Progressing   Problem: Safety: Goal: Ability to remain free from injury will improve Outcome: Progressing   Problem: Skin Integrity: Goal: Risk for impaired skin integrity will decrease Outcome: Progressing   Problem: Education: Goal: Knowledge of General Education information will improve Description: Including pain rating scale, medication(s)/side effects and non-pharmacologic comfort measures Outcome: Not Progressing   Problem: Coping: Goal: Level of anxiety will decrease Outcome: Not Progressing

## 2023-12-22 DIAGNOSIS — N390 Urinary tract infection, site not specified: Secondary | ICD-10-CM | POA: Diagnosis not present

## 2023-12-22 DIAGNOSIS — A419 Sepsis, unspecified organism: Secondary | ICD-10-CM | POA: Diagnosis not present

## 2023-12-22 LAB — BASIC METABOLIC PANEL WITH GFR
Anion gap: 14 (ref 5–15)
BUN: 9 mg/dL (ref 8–23)
CO2: 13 mmol/L — ABNORMAL LOW (ref 22–32)
Calcium: 8 mg/dL — ABNORMAL LOW (ref 8.9–10.3)
Chloride: 115 mmol/L — ABNORMAL HIGH (ref 98–111)
Creatinine, Ser: 0.75 mg/dL (ref 0.44–1.00)
GFR, Estimated: >60 mL/min (ref >60)
Glucose, Bld: 75 mg/dL (ref 70–99)
Potassium: 3.3 mmol/L — ABNORMAL LOW (ref 3.5–5.1)
Sodium: 142 mmol/L (ref 135–145)

## 2023-12-22 LAB — MAGNESIUM: Magnesium: 1.5 mg/dL — ABNORMAL LOW (ref 1.7–2.4)

## 2023-12-22 MED ORDER — MAGNESIUM SULFATE 4 GM/100ML IV SOLN
4.0000 g | Freq: Once | INTRAVENOUS | Status: AC
  Administered 2023-12-22: 4 g via INTRAVENOUS
  Filled 2023-12-22: qty 100

## 2023-12-22 MED ORDER — POTASSIUM CHLORIDE 20 MEQ PO PACK
40.0000 meq | PACK | ORAL | Status: AC
  Administered 2023-12-22 (×2): 40 meq via ORAL
  Filled 2023-12-22 (×2): qty 2

## 2023-12-22 MED ORDER — SODIUM BICARBONATE 650 MG PO TABS
650.0000 mg | ORAL_TABLET | Freq: Two times a day (BID) | ORAL | Status: DC
  Administered 2023-12-22 – 2023-12-29 (×15): 650 mg via ORAL
  Filled 2023-12-22 (×15): qty 1

## 2023-12-22 MED ORDER — DILTIAZEM HCL ER COATED BEADS 180 MG PO CP24
180.0000 mg | ORAL_CAPSULE | Freq: Every day | ORAL | Status: DC
  Administered 2023-12-22 – 2023-12-29 (×8): 180 mg via ORAL
  Filled 2023-12-22 (×8): qty 1

## 2023-12-22 NOTE — Progress Notes (Addendum)
 PROGRESS NOTE    Monica Conway  FMW:[id number] DOB: 1945/12/31 DOA: 12/18/2023 PCP: [facility]    Brief Narrative:   Monica Conway is a 78 y.o. 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  Patient lives at home with assistance of caregivers, who left last night as they usually do.  She then called 911 stating that she was being kidnapped in her own house.  Patient was recently treated with oral antibiotics for UTI outpatient.  In the ED, temperature 97.9 F, HR 110, RR 36, BP 155/116, SpO2 97% on room air.  WBC 16.0, hemoglobin 13.6, platelet count 336.  Sodium 139, potassium 3.6, chloride 110, CO2 12, glucose 60, BUN 39, creatinine 1.44.  AST 75, ALT 32, total bilirubin 0.7.  Lactic acid 1.2.  Urinalysis with large leukocytes, negative nitrite, many bacteria, greater than 50 WBCs.  CT head without contrast with no acute intracranial abnormality.  Assessment & Plan:   Acute metabolic encephalopathy, POA E. coli/Proteus urinary tract infection Patient presenting to the ED with confusion.  Patient afebrile.  Elevated WBC count of 16.0 and urinalysis consistent with UTI. -- Urine culture: + >100K proteus mirabilis and >30K Ecoli -- Blood cultures: No growth x 4 days -- Cefadroxil 500 g p.o. twice daily  Paroxysmal atrial fibrillation with RVR HTN At time of presentation to the ED patient was noted to be tachycardic in A-fib with RVR.  On metoprolol  succinate 25 mg p.o. daily outpatient.  No longer on anticoagulation due to history of GI bleed/gastric ulcer.  Initially started on a Cardizem  drip with improvement now discontinued. -- Metoprolol  succinate 50 mg p.o.  daily -- Transition short acting Cardizem  to Cardizem  CD 180 mg p.o. daily today -- Hold home amlodipine  for now -- Continue monitor on telemetry  Hypokalemia Hypomagnesemia Potassium 3.3, museum 1.5 will replete. -- Repeat electrolytes in a.m.  Hx gastric ulcer/upper GI bleed -- Protonix  40 m p.o. twice daily  History of CVA Patient with previous history of right basal ganglia CVA 01/2023, no focal deficits.  CT head on admission with no acute intracranial normality, noted chronic right PCA and right basal infarcts.  Cognitive impairment -- Delirium precautions -- Get up during the day -- Encourage a familiar face to remain present throughout the day -- Keep blinds open and lights on during daylight hours -- Minimize the use of opioids/benzodiazepines -- Seroquel  25 mg PO BID -- Melatonin 3 mg pO qHS -- Haldol 5mg  IV q6h PRN agitation  Weakness/ability/deconditioning: -- PT/OT recommend SNF placement. -- TOC following  DVT prophylaxis: heparin  injection 5,000 Units Start: 12/18/23 1400    Code Status: Limited: Do not attempt resuscitation (DNR) -DNR-LIMITED -Do Not Intubate/DNI  Family Communication:   Disposition Plan:  Level of care: Progressive Status is: Inpatient Remains inpatient appropriate because: Pending SNF placement    Consultants:  None  Procedures:  None  Antimicrobials:  Ceftriaxone  10/24 - 10/26 Cefadroxil 10/26>>   Subjective: Patient seen examined at bedside, lying in bed.  Sleeping but easily arousable.  No complaints this morning.  Heart rate remains controlled we will convert short acting Cardizem  to Cardizem  CD today.  Patient denies headache, no dizziness, no chest pain, no shortness of breath, no abdominal pain, no  fever. Patient denies headache, no chest pain, no shortness of breath, no abdominal pain, no generalized discomfort.  No other concerns overnight per nursing staff.   Objective: Vitals:   12/21/23 1337 12/21/23 1600  12/21/23 2028 12/22/23 0540  BP: (!) 169/79 123/62 132/70 (!) 158/78  Pulse: 70 64  69  Resp: 18 17  20   Temp: 98.1 F (36.7 C)  98.4 F (36.9 C) 98.8 F (37.1 C)  TempSrc: Axillary  Axillary Oral  SpO2: 99% 99% 99% 99%  Weight:      Height:        Intake/Output Summary (Last 24 hours) at 12/22/2023 1110 Last data filed at 12/22/2023 0610 Gross per 24 hour  Intake 420 ml  Output 450 ml  Net -30 ml   Filed Weights   12/18/23 0559 12/18/23 1625  Weight: 54.6 kg 41.4 kg    Examination:  Physical Exam: GEN: NAD, alert, pleasantly confused, elderly in appearance HEENT: NCAT, PERRL, EOMI, sclera clear, MMM PULM: CTAB w/o wheezes/crackles, normal respiratory effort, on room air CV: Irregularly irregular rhythm, normal rate w/o M/G/R GI: abd soft, NTND, + BS MSK: no peripheral edema, moves all extremities independently NEURO: No focal neurological deficit PSYCH: normal mood/affect Integumentary: No concerning rashes/lesions/wounds none exposed skin surfaces    Data Reviewed: I have personally reviewed following labs and imaging studies  CBC: Recent Labs  Lab 12/18/23 0712 12/19/23 0502 12/20/23 0430 12/21/23 0405  WBC 16.0* 10.9* 12.1* 10.8*  NEUTROABS 13.6*  --   --   --   HGB 13.6 11.4* 12.0 11.7*  HCT 43.1 37.6 36.7 36.7  MCV 96.4 100.3* 95.6 97.1  PLT 336 238 269 302   Basic Metabolic Panel: Recent Labs  Lab 12/18/23 0712 12/19/23 0502 12/20/23 0430 12/21/23 0405 12/22/23 0407  NA 139 139 140 144 142  K 3.6 3.2* 4.0 4.0 3.3*  CL 110 112* 114* 117* 115*  CO2 12* 12* 14* 15* 13*  GLUCOSE 60* 66* 96 109* 75  BUN 39* 27* 17 13 9   CREATININE 1.44* 1.19* 0.97 0.76 0.75  CALCIUM  8.6* 8.3* 8.7* 8.4* 8.0*  MG  --   --  1.8 1.5* 1.5*  PHOS  --   --  2.2*  --   --    GFR: Estimated Creatinine Clearance: 38.5 mL/min (by C-G formula based on SCr of 0.75 mg/dL). Liver Function Tests: Recent Labs  Lab 12/18/23 9287 12/19/23 0502 12/21/23 0405  AST 75*  55* 49*  ALT 32 24 29  ALKPHOS 223* 166* 169*  BILITOT 0.7 0.5 0.4  PROT 6.9 6.1* 6.4*  ALBUMIN 3.1* 2.7* 2.9*   No results for input(s): LIPASE, AMYLASE in the last 168 hours. No results for input(s): AMMONIA in the last 168 hours. Coagulation Profile: Recent Labs  Lab 12/18/23 0712  INR 1.2   Cardiac Enzymes: No results for input(s): CKTOTAL, CKMB, CKMBINDEX, TROPONINI in the last 168 hours. BNP (last 3 results) No results for input(s): PROBNP in the last 8760 hours. HbA1C: No results for input(s): HGBA1C in the last 72 hours. CBG: No results for input(s): GLUCAP in the last 168 hours. Lipid Profile: No results for input(s): CHOL, HDL, LDLCALC, TRIG, CHOLHDL, LDLDIRECT in the last 72 hours. Thyroid Function Tests: No results for input(s): TSH, T4TOTAL, FREET4, T3FREE, THYROIDAB in the last 72 hours. Anemia Panel: No results for input(s): VITAMINB12, FOLATE, FERRITIN, TIBC, IRON , RETICCTPCT in the last 72 hours. Sepsis Labs: Recent Labs  Lab 12/18/23 0708 12/18/23 0912  LATICACIDVEN  1.2 1.0    Recent Results (from the past 240 hours)  Urine Culture     Status: Abnormal   Collection Time: 12/18/23  6:13 AM   Specimen: Urine, Random  Result Value Ref Range Status   Specimen Description   Final    URINE, RANDOM Performed at [facility], [address], [place], [place] [address]    Special Requests   Final    NONE Reflexed from [phone] Performed at [facility], [address], [place], [place] [address]    Culture (A)  Final    >=100,000 COLONIES/mL PROTEUS MIRABILIS 30,000 COLONIES/mL ESCHERICHIA COLI    Report Status 12/20/2023 FINAL  Final   Organism ID, Bacteria PROTEUS MIRABILIS (A)  Final   Organism ID, Bacteria ESCHERICHIA COLI (A)  Final      Susceptibility   Escherichia coli - MIC*    AMPICILLIN >=32 RESISTANT Resistant     CEFAZOLIN  (URINE) Value in next row  Sensitive      2 SENSITIVEThis is a modified FDA-approved test that has been validated and its performance characteristics determined by the reporting laboratory.  This laboratory is certified under the Clinical Laboratory Improvement Amendments CLIA as qualified to perform high complexity clinical laboratory testing.    CEFEPIME  Value in next row Sensitive      2 SENSITIVEThis is a modified FDA-approved test that has been validated and its performance characteristics determined by the reporting laboratory.  This laboratory is certified under the Clinical Laboratory Improvement Amendments CLIA as qualified to perform high complexity clinical laboratory testing.    ERTAPENEM Value in next row Sensitive      2 SENSITIVEThis is a modified FDA-approved test that has been validated and its performance characteristics determined by the reporting laboratory.  This laboratory is certified under the Clinical Laboratory Improvement Amendments CLIA as qualified to perform high complexity clinical laboratory testing.    CEFTRIAXONE  Value in next row Sensitive      2 SENSITIVEThis is a modified FDA-approved test that has been validated and its performance characteristics determined by the reporting laboratory.  This laboratory is certified under the Clinical Laboratory Improvement Amendments CLIA as qualified to perform high complexity clinical laboratory testing.    CIPROFLOXACIN Value in next row Sensitive      2 SENSITIVEThis is a modified FDA-approved test that has been validated and its performance characteristics determined by the reporting laboratory.  This laboratory is certified under the Clinical Laboratory Improvement Amendments CLIA as qualified to perform high complexity clinical laboratory testing.    GENTAMICIN Value in next row Sensitive      2 SENSITIVEThis is a modified FDA-approved test that has been validated and its performance characteristics determined by the reporting laboratory.  This  laboratory is certified under the Clinical Laboratory Improvement Amendments CLIA as qualified to perform high complexity clinical laboratory testing.    NITROFURANTOIN Value in next row Sensitive      2 SENSITIVEThis is a modified FDA-approved test that has been validated and its performance characteristics determined by the reporting laboratory.  This laboratory is certified under the Clinical Laboratory Improvement Amendments CLIA as qualified to perform high complexity clinical laboratory testing.    TRIMETH/SULFA Value in next row Sensitive      2 SENSITIVEThis is a modified FDA-approved test that has been validated and its performance characteristics determined by the reporting laboratory.  This laboratory is certified under the Clinical Laboratory Improvement Amendments CLIA as qualified to perform high complexity  clinical laboratory testing.    AMPICILLIN/SULBACTAM Value in next row Intermediate      2 SENSITIVEThis is a modified FDA-approved test that has been validated and its performance characteristics determined by the reporting laboratory.  This laboratory is certified under the Clinical Laboratory Improvement Amendments CLIA as qualified to perform high complexity clinical laboratory testing.    PIP/TAZO Value in next row Sensitive      <=4 SENSITIVEThis is a modified FDA-approved test that has been validated and its performance characteristics determined by the reporting laboratory.  This laboratory is certified under the Clinical Laboratory Improvement Amendments CLIA as qualified to perform high complexity clinical laboratory testing.    MEROPENEM Value in next row Sensitive      <=4 SENSITIVEThis is a modified FDA-approved test that has been validated and its performance characteristics determined by the reporting laboratory.  This laboratory is certified under the Clinical Laboratory Improvement Amendments CLIA as qualified to perform high complexity clinical laboratory testing.    *  30,000 COLONIES/mL ESCHERICHIA COLI   Proteus mirabilis - MIC*    AMPICILLIN Value in next row Sensitive      <=4 SENSITIVEThis is a modified FDA-approved test that has been validated and its performance characteristics determined by the reporting laboratory.  This laboratory is certified under the Clinical Laboratory Improvement Amendments CLIA as qualified to perform high complexity clinical laboratory testing.    CEFAZOLIN  (URINE) Value in next row Sensitive      4 SENSITIVEThis is a modified FDA-approved test that has been validated and its performance characteristics determined by the reporting laboratory.  This laboratory is certified under the Clinical Laboratory Improvement Amendments CLIA as qualified to perform high complexity clinical laboratory testing.    CEFEPIME  Value in next row Sensitive      4 SENSITIVEThis is a modified FDA-approved test that has been validated and its performance characteristics determined by the reporting laboratory.  This laboratory is certified under the Clinical Laboratory Improvement Amendments CLIA as qualified to perform high complexity clinical laboratory testing.    ERTAPENEM Value in next row Sensitive      4 SENSITIVEThis is a modified FDA-approved test that has been validated and its performance characteristics determined by the reporting laboratory.  This laboratory is certified under the Clinical Laboratory Improvement Amendments CLIA as qualified to perform high complexity clinical laboratory testing.    CEFTRIAXONE  Value in next row Sensitive      4 SENSITIVEThis is a modified FDA-approved test that has been validated and its performance characteristics determined by the reporting laboratory.  This laboratory is certified under the Clinical Laboratory Improvement Amendments CLIA as qualified to perform high complexity clinical laboratory testing.    CIPROFLOXACIN Value in next row Resistant      4 SENSITIVEThis is a modified FDA-approved test that has  been validated and its performance characteristics determined by the reporting laboratory.  This laboratory is certified under the Clinical Laboratory Improvement Amendments CLIA as qualified to perform high complexity clinical laboratory testing.    GENTAMICIN Value in next row Sensitive      4 SENSITIVEThis is a modified FDA-approved test that has been validated and its performance characteristics determined by the reporting laboratory.  This laboratory is certified under the Clinical Laboratory Improvement Amendments CLIA as qualified to perform high complexity clinical laboratory testing.    NITROFURANTOIN Value in next row Resistant      4 SENSITIVEThis is a modified FDA-approved test that has been validated and its performance characteristics determined  by the reporting laboratory.  This laboratory is certified under the Clinical Laboratory Improvement Amendments CLIA as qualified to perform high complexity clinical laboratory testing.    TRIMETH/SULFA Value in next row Sensitive      4 SENSITIVEThis is a modified FDA-approved test that has been validated and its performance characteristics determined by the reporting laboratory.  This laboratory is certified under the Clinical Laboratory Improvement Amendments CLIA as qualified to perform high complexity clinical laboratory testing.    AMPICILLIN/SULBACTAM Value in next row Sensitive      4 SENSITIVEThis is a modified FDA-approved test that has been validated and its performance characteristics determined by the reporting laboratory.  This laboratory is certified under the Clinical Laboratory Improvement Amendments CLIA as qualified to perform high complexity clinical laboratory testing.    PIP/TAZO Value in next row Sensitive      <=4 SENSITIVEThis is a modified FDA-approved test that has been validated and its performance characteristics determined by the reporting laboratory.  This laboratory is certified under the Clinical Laboratory Improvement  Amendments CLIA as qualified to perform high complexity clinical laboratory testing.    MEROPENEM Value in next row Sensitive      <=4 SENSITIVEThis is a modified FDA-approved test that has been validated and its performance characteristics determined by the reporting laboratory.  This laboratory is certified under the Clinical Laboratory Improvement Amendments CLIA as qualified to perform high complexity clinical laboratory testing.    * >=100,000 COLONIES/mL PROTEUS MIRABILIS  Culture, blood (Routine x 2)     Status: None (Preliminary result)   Collection Time: 12/18/23  7:11 AM   Specimen: BLOOD  Result Value Ref Range Status   Specimen Description   Final    BLOOD RIGHT ANTECUBITAL Performed at [facility], [address], [place], [place] [address]    Special Requests   Final    BOTTLES DRAWN AEROBIC AND ANAEROBIC Blood Culture adequate volume Performed at [facility], [address] [place], [place] [address]    Culture   Final    NO GROWTH 4 DAYS Performed at [facility], [address], [place], [place] [address]    Report Status PENDING  Incomplete  Culture, blood (Routine x 2)     Status: None (Preliminary result)   Collection Time: 12/18/23  9:01 AM   Specimen: BLOOD  Result Value Ref Range Status   Specimen Description   Final    BLOOD LEFT ANTECUBITAL Performed at [facility], [address] [place], [place] [address]    Special Requests   Final    BOTTLES DRAWN AEROBIC AND ANAEROBIC Blood Culture adequate volume Performed at [facility], [address], [place], [place] [address]    Culture   Final    NO GROWTH 4 DAYS Performed at [facility], [address], [place], [place] [address]    Report Status PENDING  Incomplete         Radiology Studies: No results found.       Scheduled Meds:  cefadroxil  500 mg Oral BID   diltiazem   180 mg Oral Daily    feeding supplement  237 mL Oral BID BM   heparin  injection (subcutaneous)  5,000 Units Subcutaneous Q8H   melatonin  3 mg Oral QHS   metoprolol  succinate  50 mg Oral Daily   mirabegron ER  25 mg Oral Daily   pantoprazole   40 mg Oral BID   potassium  chloride  40 mEq Oral Q4H   QUEtiapine   25 mg Oral BID   sodium bicarbonate   650 mg Oral BID   Continuous Infusions:      LOS: 4 days    Time spent: 52 minutes spent on 12/22/2023 caring for this patient face-to-face including chart review, ordering labs/tests, documenting, discussion with nursing staff, consultants, updating family and interview/physical exam    Camellia PARAS Shawon Denzer, DO [facility] Available via Epic secure chat 7am-7pm After these hours, please refer to coverage provider listed on [email or web address] 12/22/2023, 11:10 AM

## 2023-12-22 NOTE — [place] FL2 (Signed)
   MEDICAID FL2 LEVEL OF CARE FORM     IDENTIFICATION  Patient Name: Monica Conway Birthdate: 1945/05/06 Sex: female Admission Date (Current Location): 12/18/2023  [facility] and [place] Number:  Producer, Television/film/video and Address:  [facility],  [address] [place], [place] [address]      Provider Number: [id number]  Attending Physician Name and Address:  [place], Camellia PARAS, DO  Relative Name and Phone Number:       Current Level of Care: Hospital Recommended Level of Care: Skilled Nursing Facility Prior Approval Number:    Date Approved/Denied:   PASRR Number: [id number]  Discharge Plan: SNF    Current Diagnoses: Patient Active Problem List   Diagnosis Date Noted   Sepsis secondary to UTI (HCC) 12/18/2023   Lumbar compression fracture (HCC) 10/25/2023   History of stroke - 01-2023. felt to be due to afib. 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 coli UTI 02/05/2014    Orientation RESPIRATION BLADDER Height & Weight     Self, Time, Situation, Place  Normal Incontinent Weight: 91 lb 4.3 oz (41.4 kg) Height:  5' 2 (157.5 cm)  BEHAVIORAL SYMPTOMS/MOOD NEUROLOGICAL BOWEL NUTRITION STATUS      Incontinent Diet (regular)  AMBULATORY STATUS COMMUNICATION OF NEEDS Skin   Limited Assist Verbally Normal                       Personal Care Assistance Level of Assistance  Bathing, Feeding, Dressing Bathing Assistance: Limited assistance Feeding assistance: Independent Dressing Assistance: Limited assistance     Functional Limitations Info  Sight, Hearing, Speech Sight Info: Adequate Hearing  Info: Adequate Speech Info: Adequate    SPECIAL CARE FACTORS FREQUENCY  PT (By licensed PT), OT (By licensed OT)     PT Frequency: 5 x a week OT Frequency: 5 x a week            Contractures Contractures Info: Not present    Additional Factors Info  Code Status, Allergies Code Status Info: DNR Allergies Info: Pine  Pollen Extract  Caduet (Amlodipine -atorvastatin)           Current Medications (12/22/2023):  This is the current hospital active medication list Current Facility-Administered Medications  Medication Dose Route Frequency Provider Last Rate Last Admin   acetaminophen  (TYLENOL ) tablet 650 mg  650 mg Oral Q6H PRN Zella, Mir M, MD   650 mg at 12/22/23 9485   Or   acetaminophen  (TYLENOL ) suppository 650 mg  650 mg Rectal Q6H PRN Zella, Mir M, MD       albuterol  (PROVENTIL ) (2.5 MG/3ML) 0.083% nebulizer solution 2.5 mg  2.5 mg Nebulization Q2H PRN Zella, Mir M, MD       cefadroxil (DURICEF) capsule 500 mg  500 mg Oral BID [place], Eric J, DO   500 mg at 12/22/23 1154   diltiazem  (CARDIZEM  CD) 24 hr capsule 180 mg  180 mg Oral Daily [place], Eric J, DO   180 mg at 12/22/23 1153   feeding supplement (ENSURE PLUS HIGH PROTEIN) liquid 237 mL  237 mL Oral BID BM [place], Camellia PARAS, DO  haloperidol lactate (HALDOL) injection 5 mg  5 mg Intravenous Q6H PRN [place], Eric J, DO   5 mg at 12/21/23 0944   heparin  injection 5,000 Units  5,000 Units Subcutaneous Q8H Nicholaus Quarry, RPH   5,000 Units at 12/22/23 0515   melatonin tablet 3 mg  3 mg Oral QHS [place], Eric J, DO   3 mg at 12/20/23 2005   metoprolol  succinate (TOPROL -XL) 24 hr tablet 50 mg  50 mg Oral Daily [place], Eric J, DO   50 mg at 12/21/23 0834   mirabegron ER (MYRBETRIQ) tablet 25 mg  25 mg Oral Daily Ikramullah, Mir M, MD   25 mg at 12/22/23 1154   ondansetron  (ZOFRAN ) tablet 4 mg  4 mg Oral Q6H PRN Zella Katha HERO, MD       Or   ondansetron  (ZOFRAN ) injection 4 mg  4 mg Intravenous Q6H PRN  Zella, Mir M, MD       pantoprazole  (PROTONIX ) EC tablet 40 mg  40 mg Oral BID Ikramullah, Mir M, MD   40 mg at 12/22/23 1154   potassium chloride  (KLOR-CON ) packet 40 mEq  40 mEq Oral Q4H [place], Eric J, DO   40 mEq at 12/22/23 1149   QUEtiapine  (SEROQUEL ) tablet 25 mg  25 mg Oral BID Zella, Mir M, MD   25 mg at 12/22/23 1154   sodium bicarbonate  tablet 650 mg  650 mg Oral BID [place], Eric J, DO   650 mg at 12/22/23 1154     Discharge Medications: Please see discharge summary for a list of discharge medications.  Relevant Imaging Results:  Relevant Lab Results:   Additional Information [id number]  Monica HERO Mahathi Pokorney, LCSW

## 2023-12-22 NOTE — TOC Initial Note (Signed)
 Transition of Care [facility]) - Initial/Assessment Note    Patient Details  Name: Monica Conway MRN: [id number] Date of Birth: 11-12-1945  Transition of Care [facility]) CM/SW Contact:    Tawni CHRISTELLA Eva, LCSW Phone Number: 12/22/2023, 12:03 PM  Clinical Narrative:                  CSW met with the pt at bedside to discuss recommendations for SNF placement. The pt is agreeable and requested that CSW provide an update to her niece. CSW explained the process, noting that the pt will need insurance authorization. CSW attempted to contact the niece no answer. A HIPAA-compliant voicemail was left requesting a return call.   Expected Discharge Plan: Skilled Nursing Facility Barriers to Discharge: Continued Medical Work up   Patient Goals and CMS Choice Patient states their goals for this hospitalization and ongoing recovery are:: SNF to get stonger          Expected Discharge Plan and Services       Living arrangements for the past 2 months: Single Family Home                                      Prior Living Arrangements/Services Living arrangements for the past 2 months: Single Family Home Lives with:: Self Patient language and need for interpreter reviewed:: Yes Do you feel safe going back to the place where you live?: Yes      Need for Family Participation in Patient Care: No (Comment) Care giver support system in place?: No (comment)      Activities of Daily Living   ADL Screening (condition at time of admission) Independently performs ADLs?: No Does the patient have a NEW difficulty with bathing/dressing/toileting/self-feeding that is expected to last >3 days?: Yes (Initiates electronic notice to provider for possible OT consult) Does the patient have a NEW difficulty with getting in/out of bed, walking, or climbing stairs that is expected to last >3 days?: Yes (Initiates electronic notice to provider for possible PT consult) Does the patient have a NEW  difficulty with communication that is expected to last >3 days?: No Is the patient deaf or have difficulty hearing?: No Does the patient have difficulty seeing, even when wearing glasses/contacts?: No Does the patient have difficulty concentrating, remembering, or making decisions?: Yes  Permission Sought/Granted                  Emotional Assessment   Attitude/Demeanor/Rapport: Angry Affect (typically observed): Accepting, Agitated Orientation: : Oriented to Self, Oriented to  Time, Oriented to Place, Oriented to Situation   Psych Involvement: No (comment)  Admission diagnosis:  Confusion [R41.0] Atrial fibrillation with rapid ventricular response (HCC) [I48.91] Acute cystitis without hematuria [N30.00] Sepsis secondary to UTI (HCC) [A41.9, N39.0] Altered mental status, unspecified altered mental status type [R41.82] Patient Active Problem List   Diagnosis Date Noted   Sepsis secondary to UTI (HCC) 12/18/2023   Lumbar compression fracture (HCC) 10/25/2023   History of stroke - 01-2023. felt to be due to afib. 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 coli UTI 02/05/2014   PCP:  [facility]:   [id number] [place], [place] - [address] AT [facility] & [address] [place] [address] Phone: [phone] Fax: [phone]     Social Drivers of Health (SDOH) Social History: SDOH Screenings   Food Insecurity: Patient Unable To Answer (12/18/2023)  Housing: Patient Declined (12/18/2023)  Transportation Needs: Patient Declined (12/18/2023)   Utilities: Patient Declined (12/18/2023)  Social Connections: Patient Declined (12/18/2023)  Tobacco Use: Low Risk  (12/20/2023)   SDOH Interventions:     Readmission Risk Interventions    10/26/2023    1:57 PM 04/06/2023    8:44 AM  Readmission Risk Prevention Plan  Post Dischage Appt Complete   Medication Screening Complete   Transportation Screening Complete Complete  PCP or Specialist Appt within 3-5 Days  Complete  HRI or Home Care Consult  Complete  Social Work Consult for Recovery Care Planning/Counseling  Complete  Palliative Care Screening  Not Applicable  Medication Review Oceanographer)  Complete

## 2023-12-23 DIAGNOSIS — N390 Urinary tract infection, site not specified: Secondary | ICD-10-CM | POA: Diagnosis not present

## 2023-12-23 DIAGNOSIS — A419 Sepsis, unspecified organism: Secondary | ICD-10-CM | POA: Diagnosis not present

## 2023-12-23 LAB — BASIC METABOLIC PANEL WITH GFR
Anion gap: 8 (ref 5–15)
BUN: 6 mg/dL — ABNORMAL LOW (ref 8–23)
CO2: 17 mmol/L — ABNORMAL LOW (ref 22–32)
Calcium: 7.9 mg/dL — ABNORMAL LOW (ref 8.9–10.3)
Chloride: 112 mmol/L — ABNORMAL HIGH (ref 98–111)
Creatinine, Ser: 0.66 mg/dL (ref 0.44–1.00)
GFR, Estimated: >60 mL/min (ref >60)
Glucose, Bld: 108 mg/dL — ABNORMAL HIGH (ref 70–99)
Potassium: 3.8 mmol/L (ref 3.5–5.1)
Sodium: 136 mmol/L (ref 135–145)

## 2023-12-23 LAB — MAGNESIUM: Magnesium: 2.2 mg/dL (ref 1.7–2.4)

## 2023-12-23 LAB — CBC
HCT: 35.7 % — ABNORMAL LOW (ref 36.0–46.0)
Hemoglobin: 11 g/dL — ABNORMAL LOW (ref 12.0–15.0)
MCH: 29.6 pg (ref 26.0–34.0)
MCHC: 30.8 g/dL (ref 30.0–36.0)
MCV: 96 fL (ref 80.0–100.0)
Platelets: 328 K/uL (ref 150–400)
RBC: 3.72 MIL/uL — ABNORMAL LOW (ref 3.87–5.11)
RDW: 18.7 % — ABNORMAL HIGH (ref 11.5–15.5)
WBC: 10.5 K/uL (ref 4.0–10.5)
nRBC: 0 % (ref 0.0–0.2)

## 2023-12-23 LAB — CULTURE, BLOOD (ROUTINE X 2)
Culture: NO GROWTH
Culture: NO GROWTH
Special Requests: ADEQUATE
Special Requests: ADEQUATE

## 2023-12-23 LAB — PHOSPHORUS: Phosphorus: 2.8 mg/dL (ref 2.5–4.6)

## 2023-12-23 MED ORDER — TRAMADOL HCL 50 MG PO TABS
50.0000 mg | ORAL_TABLET | Freq: Four times a day (QID) | ORAL | Status: DC | PRN
  Administered 2023-12-23 – 2023-12-29 (×13): 50 mg via ORAL
  Filled 2023-12-23 (×13): qty 1

## 2023-12-23 NOTE — Plan of Care (Signed)

## 2023-12-23 NOTE — Progress Notes (Signed)
 Progress Note   Patient: Monica Conway DOB: Apr 04, 1945 DOA: 12/18/2023     5 DOS: the patient was seen and examined on 12/23/2023   Brief hospital course:  Monica Conway is a 78 y.o. 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  Patient lives at home with assistance of caregivers, who left last night as they usually do.  She then called 911 stating that she was being kidnapped in her own house.  Patient was recently treated with oral antibiotics for UTI outpatient.   In the ED, temperature 97.9 F, HR 110, RR 36, BP 155/116, SpO2 97% on room air.  WBC 16.0, hemoglobin 13.6, platelet count 336.  Sodium 139, potassium 3.6, chloride 110, CO2 12, glucose 60, BUN 39, creatinine 1.44.  AST 75, ALT 32, total bilirubin 0.7.  Lactic acid 1.2.  Urinalysis with large leukocytes, negative nitrite, many bacteria, greater than 50 WBCs.  CT head without contrast with no acute intracranial abnormality.    Pt was admitted for further evaluation and management of acute metabolic encephalopathy felt due to UTI and A-fib with RVR as outlined in detail below.   Assessment and Plan:  Acute metabolic encephalopathy, POA E. coli/Proteus urinary tract infection Patient presenting to the ED with confusion.  Patient afebrile.  Elevated WBC count of 16.0 and urinalysis consistent with UTI. -- Urine culture: + >100K proteus mirabilis and >30K Ecoli -- Blood cultures: No growth x 4 days -- Cefadroxil 500 g p.o. twice daily -- completed course   Paroxysmal atrial fibrillation with RVR HTN At time of presentation to the ED patient was noted to be tachycardic in A-fib with RVR.  On metoprolol   succinate 25 mg p.o. daily outpatient.  No longer on anticoagulation due to history of GI bleed/gastric ulcer.  Initially started on a Cardizem  drip with improvement now discontinued. -- Metoprolol  succinate 50 mg p.o. daily -- Continue Cardizem  CD 180 mg p.o. daily  -- Holding home amlodipine   -- Telemetry   Hypokalemia - resolved Hypomagnesemia - resolved -- monitor & replace electrolytes   Hx gastric ulcer/upper GI bleed -- Protonix  40 m p.o. twice daily   History of CVA Patient with previous history of right basal ganglia CVA 01/2023, no focal deficits.  CT head on admission with no acute intracranial normality, noted chronic right PCA and right basal infarcts.   Cognitive impairment -- Delirium precautions -- Get up during the day -- Encourage a familiar face to remain present throughout the day -- Keep blinds open and lights on during daylight hours -- Minimize the use of opioids/benzodiazepines -- Seroquel  25 mg PO BID -- Melatonin 3 mg pO qHS -- Haldol 5mg  IV q6h PRN agitation   Weakness/ability/deconditioning: -- PT/OT recommend SNF placement. -- TOC following for placement      Subjective: Pt seen awake resting in bed this AM.  She reports having pain all over, especially her neck.  RN reports pt needed Prn haldol yesterday.  Pt refusing rehab now.  Physical Exam: Vitals:   12/23/23 0232 12/23/23 1002 12/23/23 1100 12/23/23 1400  BP: 111/64 139/86  (!) 111/59  Pulse: 63 77    Resp: 17 (!) 22 (!) 22 19  Temp: 97.9 F (36.6 C)  TempSrc: Axillary     SpO2: 98%     Weight:      Height:       General exam: awake, alert, no acute distress, frail, seems confused HEENT: moist mucus membranes, hearing grossly normal  Respiratory system: CTAB, no wheezes, rales or rhonchi, normal respiratory effort. Cardiovascular system: normal S1/S2, RRR, no pedal edema.   Gastrointestinal system: soft, NT, ND, no HSM felt, +bowel sounds. Central nervous system: A&O xself  hospital at least. no gross focal neurologic deficits, normal speech Extremities: moves all, no edema, normal tone Skin: dry, intact, normal temperature Psychiatry: normal mood, congruent affect, seems confused   Data Reviewed:  Notable labs --  Cl 112 Bicarb 17 Glucose 108 Ca 7.9 Hbg 11.0   Family Communication: None present on rounds  Disposition: Status is: Inpatient Remains inpatient appropriate because: pending SNF placement  Planned Discharge Destination: Skilled nursing facility    Time spent: 45 minutes  Author: Burnard DELENA Cunning, DO 12/23/2023 4:23 PM  For on call review [email or web address].

## 2023-12-23 NOTE — TOC Progression Note (Signed)
 Transition of Care [facility]) - Progression Note    Patient Details  Name: Monica Conway MRN: [id number] Date of Birth: 17-Mar-1945  [facility]) CM/SW Contact  Tawni CHRISTELLA Eva, LCSW Phone Number: 12/23/2023, 11:31 AM  Clinical Narrative:     CSW met with the patient at bedside to discuss bed offers for SNF placement. The patient declined SNF placement and expressed her desire to return home with home health services. The patient reported that she has PCS services at home that assist her in the morning and at night. She also stated that she has a home health RN who assists her as well. CSW inquired if the patient knew the name of the home health agency; the patient referred CSW to her brother, stating he might know. CSW spoke with the patient's brother, who reported that the patient receives aide services through [facility] but did not know which home health agency provides her RN services. CSW discussed recommendations for SNF placement with the patient's brother. He believes the patient should go to rehab before returning home and stated he will attempt to speak with her to encourage this decision. CSW stated she will follow up with the patient after their discussion.  CSW spoke with [facility], who reported that the patient receives three hours of aide services in the morning and three hours at night.  3:00pm  CSW spoke with both pt and pt's brother to discuss discharge planning. Pt has finally agreed to SNF and has [facility]. CSW has sent message to [facility] in  admission. CSW to initiate insurance authorization. Care management to follow.   Expected Discharge Plan: Skilled Nursing Facility Barriers to Discharge: Continued Medical Work up               Expected Discharge Plan and Services       Living arrangements for the past 2 months: Single Family Home                                       Social Drivers of Health (SDOH) Interventions SDOH  Screenings   Food Insecurity: Patient Unable To Answer (12/18/2023)  Housing: Patient Declined (12/18/2023)  Transportation Needs: Patient Declined (12/18/2023)  Utilities: Patient Declined (12/18/2023)  Social Connections: Patient Declined (12/18/2023)  Tobacco Use: Low Risk  (12/20/2023)    Readmission Risk Interventions    10/26/2023    1:57 PM 04/06/2023    8:44 AM  Readmission Risk Prevention Plan  Post Dischage Appt Complete   Medication Screening Complete   Transportation Screening Complete Complete  PCP or Specialist Appt within 3-5 Days  Complete  HRI or Home Care Consult  Complete  Social Work Consult for Recovery Care Planning/Counseling  Complete  Palliative Care Screening  Not Applicable  Medication Review Oceanographer)  Complete

## 2023-12-24 DIAGNOSIS — N390 Urinary tract infection, site not specified: Secondary | ICD-10-CM | POA: Diagnosis not present

## 2023-12-24 DIAGNOSIS — A419 Sepsis, unspecified organism: Secondary | ICD-10-CM | POA: Diagnosis not present

## 2023-12-24 NOTE — Progress Notes (Signed)
 Progress Note   Patient: Monica Conway FMW:[id number] DOB: Nov 22, 1945 DOA: 12/18/2023     6 DOS: the patient was seen and examined on 12/24/2023   Brief hospital course:  Monica Conway is a 78 y.o. 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  Patient lives at home with assistance of caregivers, who left last night as they usually do.  She then called 911 stating that she was being kidnapped in her own house.  Patient was recently treated with oral antibiotics for UTI outpatient.   In the ED, temperature 97.9 F, HR 110, RR 36, BP 155/116, SpO2 97% on room air.  WBC 16.0, hemoglobin 13.6, platelet count 336.  Sodium 139, potassium 3.6, chloride 110, CO2 12, glucose 60, BUN 39, creatinine 1.44.  AST 75, ALT 32, total bilirubin 0.7.  Lactic acid 1.2.  Urinalysis with large leukocytes, negative nitrite, many bacteria, greater than 50 WBCs.  CT head without contrast with no acute intracranial abnormality.    Pt was admitted for further evaluation and management of acute metabolic encephalopathy felt due to UTI and A-fib with RVR as outlined in detail below.   Assessment and Plan:  Acute metabolic encephalopathy, POA E. coli/Proteus urinary tract infection Patient presenting to the ED with confusion.  Patient afebrile.  Elevated WBC count of 16.0 and urinalysis consistent with UTI. -- Urine culture: + >100K proteus mirabilis and >30K Ecoli -- Blood cultures: No growth x 4 days -- Cefadroxil 500 g p.o. twice daily -- completed course   Paroxysmal atrial fibrillation with RVR HTN At time of presentation to the ED patient was noted to be tachycardic in A-fib with RVR.  On metoprolol   succinate 25 mg p.o. daily outpatient.  No longer on anticoagulation due to history of GI bleed/gastric ulcer.  Initially started on a Cardizem  drip with improvement now discontinued. -- Metoprolol  succinate 50 mg p.o. daily -- Continue Cardizem  CD 180 mg p.o. daily  -- Holding home amlodipine   -- Telemetry   Hypokalemia - resolved Hypomagnesemia - resolved -- monitor & replace electrolytes   Hx gastric ulcer/upper GI bleed -- Protonix  40 m p.o. twice daily   History of CVA Patient with previous history of right basal ganglia CVA 01/2023, no focal deficits.  CT head on admission with no acute intracranial normality, noted chronic right PCA and right basal infarcts.   Cognitive impairment -- Delirium precautions -- Get up during the day -- Encourage a familiar face to remain present throughout the day -- Keep blinds open and lights on during daylight hours -- Minimize the use of opioids/benzodiazepines -- Seroquel  25 mg PO BID -- Melatonin 3 mg pO qHS -- Haldol 5mg  IV q6h PRN agitation   Weakness/ability/deconditioning: -- PT/OT recommend SNF placement. -- TOC following for placement      Subjective: Pt was talking on the phone when seen on rounds this morning.  Denies acute complaints.  No acute events reported.  Ask about going home, reminded her working on rehab just pending insurance Auth.  Physical Exam: Vitals:   12/23/23 1400 12/23/23 2002 12/24/23 0503 12/24/23 1315  BP: (!) 111/59 111/63 (!) 142/67   Pulse:   73   Resp: 19   20  Temp:  99.7 F (37.6  C) (!) 97.5 F (36.4 C) 97.7 F (36.5 C)  TempSrc:  Oral Oral Oral  SpO2:  97% 97%   Weight:      Height:       General exam: awake, alert, no acute distress, frail HEENT: moist mucus membranes, hearing grossly normal  Respiratory system: On room air , normal respiratory effort. Cardiovascular system: normal S1/S2, RRR, no pedal edema.   Central nervous system: A&O xself hospital at least.  Normal  speech Extremities: moves all, no edema, normal tone Skin: dry, intact, normal temperature Psychiatry: normal mood, congruent affect, seems confused   Data Reviewed: No new labs today Notable labs 10/29--  Cl 112 Bicarb 17 Glucose 108 Ca 7.9 Hbg 11.0   Family Communication: None present on rounds  Disposition: Status is: Inpatient Remains inpatient appropriate because: pending SNF placement  Planned Discharge Destination: Skilled nursing facility    Time spent: 35 minutes  Author: Burnard DELENA Cunning, DO 12/24/2023 4:03 PM  For on call review [email or web address].

## 2023-12-24 NOTE — TOC Progression Note (Addendum)
 Transition of Care [facility]) - Progression Note    Patient Details  Name: Monica Conway MRN: [id number] Date of Birth: [birth year]  Transition [facility] Care [facility]) CM/SW Contact  Tawni CHRISTELLA Eva, LCSW Phone Number: 12/24/2023, 8:52 AM  Clinical Narrative:     Pt's insurance is pending. Care management to follow.    ADDEN  1:30pm  CSW spoke with the pt's brother, who stated that he had spoken with the facility. They are requiring the pt to pay the co-pay upfront prior to admission. The pt's brother stated that he is unable to accommodate this and would like to switch facilities. The pt has chosen [facility]. CSW sent a message to [facility] in admissions at [facility] to inform her of the pt's choice.  CSW later received a message from [facility], who reported that there are no female beds available at [facility] but that one is available at [facility]. CSW spoke with the pt's brother, and he agreed to the bed at [facility]. CSW will contact the insurance company to update the facility information. Insurance auth still pending.  Care management to follow.  Adden 2:30pm CSW received a call from [facility] with [facility] admission, she report they will have to charge pt as considering and do an in-person review. Care management to follow.   Expected Discharge Plan: Skilled Nursing Facility Barriers to Discharge: Continued Medical Work up               Expected Discharge Plan and Services       Living arrangements for the past 2 months: Single Family Home                                       Social Drivers of Health (SDOH) Interventions SDOH Screenings   Food Insecurity: Patient Unable To Answer (12/18/2023)  Housing: Patient Declined (12/18/2023)  Transportation Needs: Patient Declined (12/18/2023)  Utilities: Patient Declined (12/18/2023)  Social Connections: Patient Declined (12/18/2023)  Tobacco Use: Low Risk  (12/20/2023)    Readmission Risk Interventions     10/26/2023    1:57 PM 04/06/2023    8:44 AM  Readmission Risk Prevention Plan  Post Dischage Appt Complete   Medication Screening Complete   Transportation Screening Complete Complete  PCP or Specialist Appt within 3-5 Days  Complete  HRI or Home Care Consult  Complete  Social Work Consult for Recovery Care Planning/Counseling  Complete  Palliative Care Screening  Not Applicable  Medication Review Oceanographer)  Complete

## 2023-12-24 NOTE — Plan of Care (Signed)

## 2023-12-24 NOTE — Progress Notes (Signed)
 Physical Therapy Treatment Patient Details Name: Monica Conway MRN: [id number] DOB: January 06, 1946 Today's Date: 12/24/2023   History of Present Illness Monica Conway is a 78 y.o. female admitted  [facility] ED on 12/18/2023 from home via EMS with confusion, Acute metabolic encephalopathy,   E. coli/Proteus urinary tract infection  PMH includes L 5 compression fx (10/25/23), prior CVA, bilateral knee severe arthritis, hypertension, A-fib no longer on Eliquis  due to GI bleed    PT Comments  Pt more alert and able to participate with therapy today.  She does c/o generalized pain in addition to R Knee pain/stiffness.  Pt was able to tolerate some exercises with limited ROM and transfer to chair with light mod A x 2.  Cont POC.  Patient will benefit from continued inpatient follow up therapy, <3 hours/day at d/c.     If plan is discharge home, recommend the following: Two people to help with walking and/or transfers;Two people to help with bathing/dressing/bathroom   Can travel by private vehicle     No  Equipment Recommendations  Wheelchair cushion (measurements PT);Wheelchair (measurements PT)    Recommendations for Other Services       Precautions / Restrictions Precautions Precautions: Fall     Mobility  Bed Mobility Overal bed mobility: Needs Assistance Bed Mobility: Supine to Sit     Supine to sit: Mod assist, +2 for physical assistance     General bed mobility comments: Increased time with cues and use of bed pad    Transfers Overall transfer level: Needs assistance Equipment used: 2 person hand held assist Transfers: Sit to/from Stand, Bed to chair/wheelchair/BSC Sit to Stand: Mod assist, +2 physical assistance   Step pivot transfers: Mod assist, +2 physical assistance       General transfer comment: Step pivot to chair with pt holding onto therapist and techs arms.  Bed pad and gait belt to rise with cues to stand straight.  Light mod A required    Ambulation/Gait                General Gait Details: only able to tolerate tx   Stairs             Wheelchair Mobility     Tilt Bed    Modified Rankin (Stroke Patients Only)       Balance Overall balance assessment: Needs assistance Sitting-balance support: Feet supported, Bilateral upper extremity supported Sitting balance-Leahy Scale: Poor Sitting balance - Comments: Could sit EOB but was using UE   Standing balance support: Bilateral upper extremity supported Standing balance-Leahy Scale: Poor                              Communication Communication Factors Affecting Communication: Hearing impaired  Cognition Arousal: Alert Behavior During Therapy: Anxious   PT - Cognitive impairments: No family/caregiver present to determine baseline, Orientation, Safety/Judgement, Problem solving, Sequencing, Attention, Awareness                       PT - Cognition Comments: Pt alert, does need cues for sequencing and transfers. Pt was able to nearly recall brother's phone number (only mixed up 2 numbers) Following commands: Impaired Following commands impaired: Only follows one step commands consistently    Cueing    Exercises General Exercises - Lower Extremity Ankle Circles/Pumps: Both, AAROM, 10 reps, Supine Long Arc Quad: AROM, Both, 10 reps, Seated Heel Slides: AAROM, Both, 10 reps,  Supine    General Comments General comments (skin integrity, edema, etc.): Pt reports R knee painful (chronic) - request Voltarin gel -notified RN.  Performed ROM prior to movement      Pertinent Vitals/Pain Pain Assessment Pain Assessment: Faces Faces Pain Scale: Hurts little more Pain Location: generalized Pain Descriptors / Indicators: Discomfort, Grimacing Pain Intervention(s): Limited activity within patient's tolerance, Monitored during session, Repositioned    Home Living                          Prior Function            PT Goals (current goals can  now be found in the care plan section) Progress towards PT goals: Progressing toward goals    Frequency    Min 2X/week      PT Plan      Co-evaluation              AM-PAC PT 6 Clicks Mobility   Outcome Measure  Help needed turning from your back to your side while in a flat bed without using bedrails?: A Lot Help needed moving from lying on your back to sitting on the side of a flat bed without using bedrails?: A Lot Help needed moving to and from a bed to a chair (including a wheelchair)?: Total Help needed standing up from a chair using your arms (e.g., wheelchair or bedside chair)?: Total Help needed to walk in hospital room?: Total Help needed climbing 3-5 steps with a railing? : Total 6 Click Score: 8    End of Session Equipment Utilized During Treatment: Gait belt Activity Tolerance: Patient limited by fatigue Patient left: with call bell/phone within reach;Other (comment);in chair;with chair alarm set (legs elevated, table in front) Nurse Communication: Mobility status (request voltaran) PT Visit Diagnosis: Other abnormalities of gait and mobility (R26.89);Muscle weakness (generalized) (M62.81)     Time: 8786-8768 PT Time Calculation (min) (ACUTE ONLY): 18 min  Charges:    $Therapeutic Activity: 8-22 mins PT General Charges $$ ACUTE PT VISIT: 1 Visit                     Benjiman, PT Acute Rehab Services [facility] [phone]    Benjiman VEAR Mulberry 12/24/2023, 1:56 PM

## 2023-12-25 DIAGNOSIS — A419 Sepsis, unspecified organism: Secondary | ICD-10-CM | POA: Diagnosis not present

## 2023-12-25 DIAGNOSIS — N390 Urinary tract infection, site not specified: Secondary | ICD-10-CM | POA: Diagnosis not present

## 2023-12-25 LAB — BASIC METABOLIC PANEL WITH GFR
Anion gap: 13 (ref 5–15)
BUN: 7 mg/dL — ABNORMAL LOW (ref 8–23)
CO2: 17 mmol/L — ABNORMAL LOW (ref 22–32)
Calcium: 8.2 mg/dL — ABNORMAL LOW (ref 8.9–10.3)
Chloride: 105 mmol/L (ref 98–111)
Creatinine, Ser: 0.77 mg/dL (ref 0.44–1.00)
GFR, Estimated: >60 mL/min (ref >60)
Glucose, Bld: 125 mg/dL — ABNORMAL HIGH (ref 70–99)
Potassium: 3.7 mmol/L (ref 3.5–5.1)
Sodium: 134 mmol/L — ABNORMAL LOW (ref 135–145)

## 2023-12-25 LAB — MAGNESIUM: Magnesium: 1.7 mg/dL (ref 1.7–2.4)

## 2023-12-25 NOTE — Progress Notes (Signed)
 Occupational Therapy Treatment Patient Details Name: Monica Conway MRN: [id number] DOB: April 01, 1945 Today's Date: 12/25/2023   History of present illness Monica Conway is a 78 y.o. female admitted  [facility] ED on 12/18/2023 from home via EMS with confusion, Acute metabolic encephalopathy,   E. coli/Proteus urinary tract infection  PMH includes L 5 compression fx (10/25/23), prior CVA, bilateral knee severe arthritis, hypertension, A-fib no longer on Eliquis  due to GI bleed   OT comments  Pt c/o generalized pain and stiffness. Pt agreeable to get to [facility] and recliner. Pt mod A to assist to EOB. Max A x2 for STS and maintaining standing balance with RW, cueing for hand/foot placement, max A x2 support for shifting weight to take very small steps to BSC/recliner. Pt not fully aware of deficits. Pt able to complete UB dressing and toileting hygiene with min A sitting, total A for LB ADLs. Will conitnue to see acutely to progress as able, DC plan to postacute rehab <3hrs/day still appropriate.       If plan is discharge home, recommend the following:  Two people to help with bathing/dressing/bathroom;Two people to help with walking and/or transfers;Assistance with cooking/housework;Assistance with feeding;Direct supervision/assist for medications management;Direct supervision/assist for financial management;Help with stairs or ramp for entrance;Assist for transportation;Supervision due to cognitive status   Equipment Recommendations  Hospital bed;Hoyer lift    Recommendations for Other Services      Precautions / Restrictions Precautions Precautions: Fall Recall of Precautions/Restrictions: Impaired Restrictions Weight Bearing Restrictions Per Provider Order: No       Mobility Bed Mobility Overal bed mobility: Needs Assistance Bed Mobility: Supine to Sit     Supine to sit: Mod assist     General bed mobility comments: mod A to assist to EOB    Transfers Overall transfer level: Needs  assistance Equipment used: Rolling walker (2 wheels) Transfers: Sit to/from Stand, Bed to chair/wheelchair/BSC Sit to Stand: Max assist, +2 physical assistance Stand pivot transfers: Max assist, +2 physical assistance         General transfer comment: max A x2 for STS and pivot to BSC/recliner, Pt barely able to take steps requires significant assistance for gait     Balance Overall balance assessment: Needs assistance Sitting-balance support: Single extremity supported, Feet supported Sitting balance-Leahy Scale: Fair Sitting balance - Comments: sits EOB and on BSC with one hand supported   Standing balance support: Bilateral upper extremity supported, During functional activity, Reliant on assistive device for balance Standing balance-Leahy Scale: Poor Standing balance comment: max A x2 with RW to maintain balance                           ADL either performed or assessed with clinical judgement   ADL Overall ADL's : Needs assistance/impaired                 Upper Body Dressing : Minimal assistance;Sitting   Lower Body Dressing: Total assistance;Sitting/lateral leans;Sit to/from stand   Toilet Transfer: Maximal assistance;+2 for physical assistance;Rolling walker (2 wheels);BSC/3in1   Toileting- Clothing Manipulation and Hygiene: Minimal assistance;Sitting/lateral lean         General ADL Comments: Pt max A x2 to get to [facility] with RW, decreased ability to take steps or pivot, increased time. Pt ablet o wipe and UB dress with min A.    Extremity/Trunk Assessment              Vision  Perception     Praxis     Communication Communication Communication: Impaired Factors Affecting Communication: Hearing impaired   Cognition Arousal: Alert Behavior During Therapy: Anxious Cognition: Cognition impaired                               Following commands: Impaired Following commands impaired: Only follows one step commands  consistently      Cueing   Cueing Techniques: Verbal cues, Gestural cues, Tactile cues, Visual cues  Exercises      Shoulder Instructions       General Comments      Pertinent Vitals/ Pain       Pain Assessment Pain Assessment: Faces Faces Pain Scale: Hurts little more Pain Location: generalized Pain Descriptors / Indicators: Discomfort, Grimacing Pain Intervention(s): Monitored during session  Home Living                                          Prior Functioning/Environment              Frequency  Min 2X/week        Progress Toward Goals  OT Goals(current goals can now be found in the care plan section)  Progress towards OT goals: Progressing toward goals  Acute Rehab OT Goals Patient Stated Goal: to improve strength OT Goal Formulation: With patient Time For Goal Achievement: 01/04/24 Potential to Achieve Goals: Fair ADL Goals Pt Will Perform Eating: with supervision;sitting Pt Will Perform Grooming: with min assist;sitting Pt Will Perform Upper Body Bathing: with mod assist;sitting Pt Will Perform Upper Body Dressing: with mod assist;sitting Pt Will Transfer to Toilet: with +2 assist;with mod assist;bedside commode;stand pivot transfer Pt Will Perform Toileting - Clothing Manipulation and hygiene: with max assist;sitting/lateral leans  Plan      Co-evaluation                 AM-PAC OT 6 Clicks Daily Activity     Outcome Measure   Help from another person eating meals?: A Little Help from another person taking care of personal grooming?: A Little Help from another person toileting, which includes using toliet, bedpan, or urinal?: A Lot Help from another person bathing (including washing, rinsing, drying)?: A Lot Help from another person to put on and taking off regular upper body clothing?: A Little Help from another person to put on and taking off regular lower body clothing?: Total 6 Click Score: 14    End of  Session Equipment Utilized During Treatment: Gait belt;Rolling walker (2 wheels)  OT Visit Diagnosis: Unsteadiness on feet (R26.81);Other abnormalities of gait and mobility (R26.89);Muscle weakness (generalized) (M62.81);Cognitive communication deficit (R41.841);Pain   Activity Tolerance Patient tolerated treatment well   Patient Left in chair;with call bell/phone within reach   Nurse Communication Mobility status        Time: 8740-8664 OT Time Calculation (min): 36 min  Charges: OT General Charges $OT Visit: 1 Visit OT Treatments $Self Care/Home Management : 23-37 mins  [place], OTR/L   Monica Conway 12/25/2023, 1:44 PM

## 2023-12-25 NOTE — Progress Notes (Signed)
 Progress Note   Patient: Monica Conway FMW:[id number] DOB: 01-Jun-1945 DOA: 12/18/2023     7 DOS: the patient was seen and examined on 12/25/2023   Brief hospital course:  Monica Conway is a 78 y.o. 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  Patient lives at home with assistance of caregivers, who left last night as they usually do.  She then called 911 stating that she was being kidnapped in her own house.  Patient was recently treated with oral antibiotics for UTI outpatient.   In the ED, temperature 97.9 F, HR 110, RR 36, BP 155/116, SpO2 97% on room air.  WBC 16.0, hemoglobin 13.6, platelet count 336.  Sodium 139, potassium 3.6, chloride 110, CO2 12, glucose 60, BUN 39, creatinine 1.44.  AST 75, ALT 32, total bilirubin 0.7.  Lactic acid 1.2.  Urinalysis with large leukocytes, negative nitrite, many bacteria, greater than 50 WBCs.  CT head without contrast with no acute intracranial abnormality.    Pt was admitted for further evaluation and management of acute metabolic encephalopathy felt due to UTI and A-fib with RVR as outlined in detail below.   Assessment and Plan:  Acute metabolic encephalopathy, POA E. coli/Proteus urinary tract infection Patient presenting to the ED with confusion.  Patient afebrile.  Elevated WBC count of 16.0 and urinalysis consistent with UTI. -- Urine culture: + >100K proteus mirabilis and >30K Ecoli -- Blood cultures: No growth x 4 days -- Cefadroxil 500 g p.o. twice daily -- completed course   Paroxysmal atrial fibrillation with RVR HTN At time of presentation to the ED patient was noted to be tachycardic in A-fib with RVR.  On metoprolol   succinate 25 mg p.o. daily outpatient.  No longer on anticoagulation due to history of GI bleed/gastric ulcer.  Initially started on a Cardizem  drip with improvement now discontinued. -- Metoprolol  succinate 50 mg p.o. daily -- Continue Cardizem  CD 180 mg p.o. daily  -- Holding home amlodipine   -- Telemetry   Hypokalemia - resolved Hypomagnesemia - resolved -- monitor & replace electrolytes   Hx gastric ulcer/upper GI bleed -- Protonix  40 m p.o. twice daily   History of CVA Patient with previous history of right basal ganglia CVA 01/2023, no focal deficits.  CT head on admission with no acute intracranial normality, noted chronic right PCA and right basal infarcts.   Cognitive impairment -- Delirium precautions -- Get up during the day -- Encourage a familiar face to remain present throughout the day -- Keep blinds open and lights on during daylight hours -- Minimize the use of opioids/benzodiazepines -- Seroquel  25 mg PO BID -- Melatonin 3 mg pO qHS -- Haldol 5mg  IV q6h PRN agitation   Weakness/ability/deconditioning: -- PT/OT recommend SNF placement. -- TOC following for placement      Subjective: Pt awake sitting up in bed when seen today.  She apologizes for getting upset and frustrated the other day.  She is eating breakfast and tells me that she is not able to eat sausage or orange juice because it causes severe indigestion and heartburn.  She would like milk with her meal trays  Physical Exam: Vitals:   12/24/23 1315 12/24/23 1958 12/25/23 0324 12/25/23 1100  BP:  (!) 106/56 136/65  130/64  Pulse:  60 68 (!) 57  Resp: 20 18 19    Temp: 97.7 F (36.5 C) 98.3 F (36.8 C) 98.3 F (36.8 C)   TempSrc: Oral Oral Oral   SpO2:  95% 95%   Weight:      Height:       General exam: awake, alert, no acute distress, frail, in better spirits today HEENT: moist mucus membranes, hearing grossly normal  Respiratory system: On room air , normal respiratory  effort. Cardiovascular system: normal S1/S2, RRR, no pedal edema.   Central nervous system: A&O x 2+, grossly nonfocal, normal speech Extremities: moves all, no edema, normal tone Skin: dry, intact, normal temperature Psychiatry: normal mood, congruent affect, seems confused   Data Reviewed:  Notable labs -  Sodium 134 Bicarb 17 Glucose 125 BUN 7 Calcium  8.2   Family Communication: None present on rounds.  No new medical updates at this time  Disposition: Status is: Inpatient Remains inpatient appropriate because: pending SNF placement and insurance authorization   Planned Discharge Destination: Skilled nursing facility    Time spent: 25 minutes  Author: Burnard DELENA Cunning, DO 12/25/2023 12:57 PM  For on call review [email or web address].

## 2023-12-25 NOTE — Plan of Care (Signed)

## 2023-12-25 NOTE — TOC Progression Note (Addendum)
 Transition of Care [facility]) - Progression Note    Patient Details  Name: Monica Conway MRN: [id number] Date of Birth: 09-23-1945  [facility]) CM/SW Contact  Tawni CHRISTELLA Eva, LCSW Phone Number: 12/25/2023, 10:35 AM  Clinical Narrative:    Pt's insurance shara is pending, pt also pending clinical review with Kathlean Milian for Possible placement. Care management to follow.   ADDEN 12:00pm  Pt's insurance has requested updated clinicals. Clinicals has been uploaded in [place] portal for review. Care management to follow.   Adden  2:00pm CSW spoke with Leontine at [facility]; they have declined the patient for rehab services.  CSW attempted to contact the patient's brother--no answer. A voicemail was left requesting a return call.  CSW spoke with Brittany at [facility] to inquire if they could review the patient for admission. After review, she stated she can mark the patient as "considering" and will review the patient next week to assess if the patient's mentation improves.  CSW attempted to speak with pt to provided update, pt very lethargic unable to keep eyes open. CSW will try again at a later time. Care management to follow.  Care Management to follow.  Expected Discharge Plan: Skilled Nursing Facility Barriers to Discharge: Continued Medical Work up               Expected Discharge Plan and Services       Living arrangements for the past 2 months: Single Family Home                                       Social Drivers of Health (SDOH) Interventions SDOH Screenings   Food Insecurity: Patient Unable To Answer (12/18/2023)  Housing: Patient Declined (12/18/2023)  Transportation Needs: Patient Declined (12/18/2023)  Utilities: Patient Declined (12/18/2023)  Social Connections: Patient Declined (12/18/2023)  Tobacco Use: Low Risk  (12/20/2023)    Readmission Risk Interventions    10/26/2023    1:57 PM 04/06/2023    8:44 AM   Readmission Risk Prevention Plan  Post Dischage Appt Complete   Medication Screening Complete   Transportation Screening Complete Complete  PCP or Specialist Appt within 3-5 Days  Complete  HRI or Home Care Consult  Complete  Social Work Consult for Recovery Care Planning/Counseling  Complete  Palliative Care Screening  Not Applicable  Medication Review Oceanographer)  Complete

## 2023-12-26 DIAGNOSIS — N390 Urinary tract infection, site not specified: Secondary | ICD-10-CM | POA: Diagnosis not present

## 2023-12-26 DIAGNOSIS — A419 Sepsis, unspecified organism: Secondary | ICD-10-CM | POA: Diagnosis not present

## 2023-12-26 MED ORDER — QUETIAPINE FUMARATE 25 MG PO TABS
25.0000 mg | ORAL_TABLET | Freq: Every day | ORAL | Status: DC
  Administered 2023-12-29: 25 mg via ORAL
  Filled 2023-12-26 (×2): qty 1

## 2023-12-26 MED ORDER — QUETIAPINE FUMARATE 50 MG PO TABS
50.0000 mg | ORAL_TABLET | Freq: Every day | ORAL | Status: DC
  Administered 2023-12-26 – 2023-12-28 (×3): 50 mg via ORAL
  Filled 2023-12-26 (×3): qty 1

## 2023-12-26 NOTE — TOC Progression Note (Signed)
 Transition of Care [name]) - Progression Note    Patient Details  Name: Monica Conway MRN: [id number] Date of Birth: 23-Feb-1946  Transition of Care [facility]) CM/SW Contact  Tawni CHRISTELLA Eva, LCSW Phone Number: 12/26/2023, 12:08 PM  Clinical Narrative:     Pt's insurance authorization has been approved, however pt does not have a SNF facility at this time. Care management to follow.    Expected Discharge Plan: Skilled Nursing Facility Barriers to Discharge: Continued Medical Work up               Expected Discharge Plan and Services       Living arrangements for the past 2 months: Single Family Home                                       Social Drivers of Health (SDOH) Interventions SDOH Screenings   Food Insecurity: Patient Unable To Answer (12/18/2023)  Housing: Patient Declined (12/18/2023)  Transportation Needs: Patient Declined (12/18/2023)  Utilities: Patient Declined (12/18/2023)  Social Connections: Patient Declined (12/18/2023)  Tobacco Use: Low Risk  (12/20/2023)    Readmission Risk Interventions    10/26/2023    1:57 PM 04/06/2023    8:44 AM  Readmission Risk Prevention Plan  Post Dischage Appt Complete   Medication Screening Complete   Transportation Screening Complete Complete  PCP or Specialist Appt within 3-5 Days  Complete  HRI or Home Care Consult  Complete  Social Work Consult for Recovery Care Planning/Counseling  Complete  Palliative Care Screening  Not Applicable  Medication Review Oceanographer)  Complete

## 2023-12-26 NOTE — Progress Notes (Signed)
 Progress Note   Patient: Monica Conway FMW:[id number] DOB: October 12, 1945 DOA: 12/18/2023     8 DOS: the patient was seen and examined on 12/26/2023   Brief hospital course:  Monica Conway is a 78 y.o. 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  Patient lives at home with assistance of caregivers, who left last night as they usually do.  She then called 911 stating that she was being kidnapped in her own house.  Patient was recently treated with oral antibiotics for UTI outpatient.   In the ED, temperature 97.9 F, HR 110, RR 36, BP 155/116, SpO2 97% on room air.  WBC 16.0, hemoglobin 13.6, platelet count 336.  Sodium 139, potassium 3.6, chloride 110, CO2 12, glucose 60, BUN 39, creatinine 1.44.  AST 75, ALT 32, total bilirubin 0.7.  Lactic acid 1.2.  Urinalysis with large leukocytes, negative nitrite, many bacteria, greater than 50 WBCs.  CT head without contrast with no acute intracranial abnormality.    Pt was admitted for further evaluation and management of acute metabolic encephalopathy felt due to UTI and A-fib with RVR as outlined in detail below.   Assessment and Plan:  Acute metabolic encephalopathy, POA E. coli/Proteus urinary tract infection Patient presenting to the ED with confusion.  Patient afebrile.  Elevated WBC count of 16.0 and urinalysis consistent with UTI. -- Urine culture: + >100K proteus mirabilis and >30K Ecoli -- Blood cultures: No growth x 4 days -- Cefadroxil 500 g p.o. twice daily -- completed course   Paroxysmal atrial fibrillation with RVR HTN At time of presentation to the ED patient was noted to be tachycardic in A-fib with RVR.  On metoprolol   succinate 25 mg p.o. daily outpatient.  No longer on anticoagulation due to history of GI bleed/gastric ulcer.  Initially started on a Cardizem  drip with improvement now discontinued. -- Metoprolol  succinate 50 mg p.o. daily -- Continue Cardizem  CD 180 mg p.o. daily  -- Holding home amlodipine   -- Telemetry   Hypokalemia - resolved Hypomagnesemia - resolved -- monitor & replace electrolytes   Hx gastric ulcer/upper GI bleed -- Protonix  40 m p.o. twice daily   History of CVA Patient with previous history of right basal ganglia CVA 01/2023, no focal deficits.  CT head on admission with no acute intracranial normality, noted chronic right PCA and right basal infarcts.   Cognitive impairment -- Delirium precautions -- Minimize the use of opioids/benzodiazepines -- Seroquel  25 mg PO daily --Increase Seroquel  bedtime dose to 50 mg since patient has been given Haldol -- Melatonin 3 mg pO qHS -- Haldol 5mg  IV q6h PRN agitation --avoid if possible   Weakness/ability/deconditioning: -- PT/OT recommend SNF placement. -- TOC following for placement      Subjective: Pt was sleeping comfortably when seen on rounds this morning.,  Wakes briefly to voice.  I see that she was given Haldol yesterday evening around 10 PM.  There is no documentation as to any behaviors however.  Physical Exam: Vitals:   12/25/23 2231 12/26/23 0525 12/26/23 0800 12/26/23 1322  BP: 114/65 136/63 (!) 149/66 (!) 120/58  Pulse: 71 61 69 (!) 57  Resp: 17 18    Temp: 98.2 F (36.8 C) 98.2 F (36.8 C) 98.8 F (37.1  C) 97.9 F (36.6 C)  TempSrc: Oral Oral Oral Oral  SpO2: 97% 97% 96% 98%  Weight:      Height:       General exam: Sleeping comfortably, wakes to voice, no acute distress, frail HEENT: moist mucus membranes, hearing grossly normal  Respiratory system: On room air , normal respiratory effort.  Lungs clear no wheezes or rhonchi Cardiovascular system: normal S1/S2, RRR, no pedal edema.   Central  nervous system: A&O x 2+, grossly nonfocal, normal speech Extremities: moves all, no edema, normal tone Skin: dry, intact, normal temperature   Data Reviewed: No new labs today Notable labs from 10/31-  Sodium 134 Bicarb 17 Glucose 125 BUN 7 Calcium  8.2   Family Communication: None present on rounds.  No new medical updates at this time  Disposition: Status is: Inpatient Remains inpatient appropriate because: Awaiting SNF placement.  Barrier appears to be the patient is been getting Haldol, bed offer apparently was withdrawn.  TOC following   Planned Discharge Destination: Skilled nursing facility    Time spent: 25 minutes  Author: Burnard DELENA Cunning, DO 12/26/2023 2:44 PM  For on call review [email or web address].

## 2023-12-26 NOTE — Plan of Care (Signed)
  Problem: Clinical Measurements: Goal: Ability to maintain clinical measurements within normal limits will improve Outcome: Progressing   Problem: Nutrition: Goal: Adequate nutrition will be maintained Outcome: Progressing   Problem: Pain Managment: Goal: General experience of comfort will improve and/or be controlled Outcome: Progressing

## 2023-12-27 DIAGNOSIS — N390 Urinary tract infection, site not specified: Secondary | ICD-10-CM | POA: Diagnosis not present

## 2023-12-27 DIAGNOSIS — A419 Sepsis, unspecified organism: Secondary | ICD-10-CM | POA: Diagnosis not present

## 2023-12-27 NOTE — Plan of Care (Signed)

## 2023-12-27 NOTE — Progress Notes (Signed)
 Progress Note   Patient: Monica Conway FMW:[id number] DOB: 10-Mar-1945 DOA: 12/18/2023     9 DOS: the patient was seen and examined on 12/27/2023   Brief hospital course:  Monica Conway is a 78 y.o. 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  Patient lives at home with assistance of caregivers, who left last night as they usually do.  She then called 911 stating that she was being kidnapped in her own house.  Patient was recently treated with oral antibiotics for UTI outpatient.   In the ED, temperature 97.9 F, HR 110, RR 36, BP 155/116, SpO2 97% on room air.  WBC 16.0, hemoglobin 13.6, platelet count 336.  Sodium 139, potassium 3.6, chloride 110, CO2 12, glucose 60, BUN 39, creatinine 1.44.  AST 75, ALT 32, total bilirubin 0.7.  Lactic acid 1.2.  Urinalysis with large leukocytes, negative nitrite, many bacteria, greater than 50 WBCs.  CT head without contrast with no acute intracranial abnormality.    Pt was admitted for further evaluation and management of acute metabolic encephalopathy felt due to UTI and A-fib with RVR as outlined in detail below.   Assessment and Plan:  Acute metabolic encephalopathy, POA E. coli/Proteus urinary tract infection Patient presenting to the ED with confusion.  Patient afebrile.  Elevated WBC count of 16.0 and urinalysis consistent with UTI. -- Urine culture: + >100K proteus mirabilis and >30K Ecoli -- Blood cultures: No growth x 4 days -- Cefadroxil 500 g p.o. twice daily -- completed course   Paroxysmal atrial fibrillation with RVR HTN At time of presentation to the ED patient was noted to be tachycardic in A-fib with RVR.  On metoprolol   succinate 25 mg p.o. daily outpatient.  No longer on anticoagulation due to history of GI bleed/gastric ulcer.  Initially started on a Cardizem  drip with improvement now discontinued. -- Metoprolol  succinate 50 mg p.o. daily -- Continue Cardizem  CD 180 mg p.o. daily  -- Holding home amlodipine   -- Telemetry   Hypokalemia - resolved Hypomagnesemia - resolved -- monitor & replace electrolytes   Hx gastric ulcer/upper GI bleed -- Protonix  40 m p.o. twice daily   History of CVA Patient with previous history of right basal ganglia CVA 01/2023, no focal deficits.  CT head on admission with no acute intracranial normality, noted chronic right PCA and right basal infarcts.   Cognitive impairment -- Delirium precautions -- Minimize the use of opioids/benzodiazepines -- Seroquel  25 mg PO daily --Increase Seroquel  bedtime dose to 50 mg since patient has been given Haldol -- Melatonin 3 mg pO qHS -- Discontinue IV Haldol --avoid if at all possible, barrier to placement   Weakness/ability/deconditioning: -- PT/OT recommend SNF placement. -- TOC following for placement      Subjective: Pt was sleeping comfortably when seen this morning, woke easily to voice.  She denies acute complaints and says she is feeling better.  She was not given any Haldol overnight.  She asked me to get one of the cookies for her from the drawer in the bedside table.  Physical Exam: Vitals:   12/26/23 0800 12/26/23 1322 12/26/23 2057 12/27/23 0513  BP: (!) 149/66 (!) 120/58 (!) 150/78 (!) 133/90  Pulse: 69 (!) 57 63 66  Resp:  20 20  Temp: 98.8 F (37.1 C) 97.9 F (36.6 C) 98.2 F (36.8 C) 98.1 F (36.7 C)  TempSrc: Oral Oral Oral Oral  SpO2: 96% 98% 98% 98%  Weight:      Height:       General exam: Sleeping comfortably, wakes to voice, no acute distress, frail HEENT: moist mucus membranes, hearing grossly normal  Respiratory system: On room air , normal respiratory effort.  Lungs clear no wheezes or  rhonchi Cardiovascular system: normal S1/S2, RRR, no pedal edema.   Central nervous system: A&O x 2+, grossly nonfocal, normal speech Extremities: moves all, no edema, normal tone Skin: dry, intact, normal temperature   Data Reviewed: No new labs today Notable labs from 10/31-  Sodium 134 Bicarb 17 Glucose 125 BUN 7 Calcium  8.2   Family Communication: None present on rounds.  No new medical updates at this time  Disposition: Status is: Inpatient Remains inpatient appropriate because: Awaiting SNF placement.  Barrier appears to be the patient is been getting Haldol, bed offer apparently was withdrawn.  TOC following   Planned Discharge Destination: Skilled nursing facility    Time spent: 25 minutes  Author: Burnard DELENA Cunning, DO 12/27/2023 12:56 PM  For on call review [email or web address].

## 2023-12-28 DIAGNOSIS — A419 Sepsis, unspecified organism: Secondary | ICD-10-CM | POA: Diagnosis not present

## 2023-12-28 DIAGNOSIS — N390 Urinary tract infection, site not specified: Secondary | ICD-10-CM | POA: Diagnosis not present

## 2023-12-28 LAB — BASIC METABOLIC PANEL WITH GFR
Anion gap: 12 (ref 5–15)
BUN: 7 mg/dL — ABNORMAL LOW (ref 8–23)
CO2: 21 mmol/L — ABNORMAL LOW (ref 22–32)
Calcium: 8.3 mg/dL — ABNORMAL LOW (ref 8.9–10.3)
Chloride: 107 mmol/L (ref 98–111)
Creatinine, Ser: 0.69 mg/dL (ref 0.44–1.00)
GFR, Estimated: >60 mL/min (ref >60)
Glucose, Bld: 98 mg/dL (ref 70–99)
Potassium: 3.1 mmol/L — ABNORMAL LOW (ref 3.5–5.1)
Sodium: 140 mmol/L (ref 135–145)

## 2023-12-28 LAB — MAGNESIUM: Magnesium: 1.5 mg/dL — ABNORMAL LOW (ref 1.7–2.4)

## 2023-12-28 MED ORDER — POTASSIUM CHLORIDE CRYS ER 20 MEQ PO TBCR
40.0000 meq | EXTENDED_RELEASE_TABLET | ORAL | Status: AC
  Administered 2023-12-28 (×2): 40 meq via ORAL
  Filled 2023-12-28 (×2): qty 2

## 2023-12-28 MED ORDER — MAGNESIUM SULFATE 4 GM/100ML IV SOLN
4.0000 g | Freq: Once | INTRAVENOUS | Status: AC
  Administered 2023-12-28: 4 g via INTRAVENOUS
  Filled 2023-12-28: qty 100

## 2023-12-28 NOTE — Progress Notes (Signed)
 Occupational Therapy Treatment Patient Details Name: Monica Conway MRN: [id number] DOB: Jul 30, 1945 Today's Date: 12/28/2023   History of present illness Monica Conway is a 78 y.o. female admitted  [facility] ED on 12/18/2023 from home via EMS with confusion, Acute metabolic encephalopathy,   E. coli/Proteus urinary tract infection  PMH includes L 5 compression fx (10/25/23), prior CVA, bilateral knee severe arthritis, hypertension, A-fib no longer on Eliquis  due to GI bleed   OT comments  Pt seen for OT treatment on this date. Upon arrival to room pt sidelying on her R side, agreeable to tx with verbal encouragement. Pt requires MAXA to come to sitting position on the EOB with bilateral UE support to remain upright posture, pt tolerated ~8 mins sitting on EOB with CGA support. Pt eager to use the BR, multiple STS attempts from the EOB with MAXA+2 pt able to clear the bed but unable to withstand pivoting due to reported back and bilateral knee pain. Pt yells out in pain and urges to be back in bed. RN aware of pt pain levels, pt assisted with positioning for comfort prior to end of session.Tech in room to provide lunch setup. Pt making good progress toward goals, will continue to follow POC. Discharge recommendation remains appropriate.        If plan is discharge home, recommend the following:  Two people to help with bathing/dressing/bathroom;Two people to help with walking and/or transfers;Assistance with cooking/housework;Assistance with feeding;Direct supervision/assist for medications management;Direct supervision/assist for financial management;Help with stairs or ramp for entrance;Assist for transportation;Supervision due to cognitive status   Equipment Recommendations  Hospital bed;Hoyer lift       Precautions / Restrictions Precautions Precautions: Fall Recall of Precautions/Restrictions: Impaired Restrictions Weight Bearing Restrictions Per Provider Order: No       Mobility Bed  Mobility Overal bed mobility: Needs Assistance Bed Mobility: Sidelying to Sit, Sit to Sidelying, Rolling Rolling: Mod assist Sidelying to sit: Max assist Supine to sit: Max assist   Sit to sidelying: MAX assist General bed mobility comments: MAXA-totalA for bed mobility as pt reports terrible back pain - RN aware    Transfers Overall transfer level: Needs assistance Equipment used: Rolling walker (2 wheels) Transfers: Sit to/from Stand Sit to Stand: Max assist, +2 safety/equipment           General transfer comment: Pt attempted to stand pivot from EOB to [facility] with MAXA+2 unable to pt returing to sitting due to reported  pain levels     Balance Overall balance assessment: Needs assistance Sitting-balance support: Single extremity supported, Feet supported Sitting balance-Leahy Scale: Fair     Standing balance support: Bilateral upper extremity supported, During functional activity, Reliant on assistive device for balance Standing balance-Leahy Scale: Poor Standing balance comment: Limited tolerance due to bilateral knee pain and back                           ADL either performed or assessed with clinical judgement   ADL Overall ADL's : Needs assistance/impaired Eating/Feeding: Set up;Bed level                   Lower Body Dressing: Maximal assistance;Bed level   Toilet Transfer: Total assistance;+2 for safety/equipment             General ADL Comments: Attempted to get to [facility] pt yells out in pain of her knees and back during attempted transfer. Returned to bed for pt to use  purwick.          Communication Communication Communication: Impaired Factors Affecting Communication: Hearing impaired   Cognition Arousal: Alert Behavior During Therapy: Anxious Cognition: Cognition impaired   Orientation impairments: Place, Time, Situation Awareness: Intellectual awareness intact, Online awareness impaired Memory impairment (select all  impairments): Short-term memory Attention impairment (select first level of impairment): Focused attention Executive functioning impairment (select all impairments): Initiation, Organization, Sequencing, Reasoning, Problem solving                   Following commands: Impaired Following commands impaired: Only follows one step commands consistently      Cueing   Cueing Techniques: Verbal cues, Gestural cues, Tactile cues, Visual cues  Exercises Exercises: Other exercises Other Exercises Other Exercises: Edu: Role of OT session, log rolling for back comfort, safe ADL completion     Pertinent Vitals/ Pain       Pain Assessment Pain Assessment: Faces Faces Pain Scale: Hurts little more Pain Location: Back Pain Descriptors / Indicators: Discomfort, Grimacing Pain Intervention(s): Limited activity within patient's tolerance, Monitored during session                                                          Frequency  Min 2X/week        Progress Toward Goals  OT Goals(current goals can now be found in the care plan section)  Progress towards OT goals: Progressing toward goals  Acute Rehab OT Goals OT Goal Formulation: With patient Time For Goal Achievement: 01/04/24 Potential to Achieve Goals: Fair ADL Goals Pt Will Perform Eating: with supervision;sitting Pt Will Perform Grooming: with min assist;sitting Pt Will Perform Upper Body Bathing: with mod assist;sitting Pt Will Perform Upper Body Dressing: with mod assist;sitting Pt Will Transfer to Toilet: with +2 assist;with mod assist;bedside commode;stand pivot transfer Pt Will Perform Toileting - Clothing Manipulation and hygiene: with max assist;sitting/lateral leans   AM-PAC OT 6 Clicks Daily Activity     Outcome Measure   Help from another person eating meals?: A Little Help from another person taking care of personal grooming?: A Little Help from another person toileting, which  includes using toliet, bedpan, or urinal?: A Lot Help from another person bathing (including washing, rinsing, drying)?: A Lot Help from another person to put on and taking off regular upper body clothing?: A Little Help from another person to put on and taking off regular lower body clothing?: Total 6 Click Score: 14    End of Session Equipment Utilized During Treatment: Gait belt;Rolling walker (2 wheels)  OT Visit Diagnosis: Unsteadiness on feet (R26.81);Other abnormalities of gait and mobility (R26.89);Muscle weakness (generalized) (M62.81);Cognitive communication deficit (R41.841);Pain   Activity Tolerance Patient limited by pain   Patient Left in bed;with bed alarm set;with nursing/sitter in room;with call bell/phone within reach   Nurse Communication Mobility status;Patient requests pain meds        Time: 8865-8847 OT Time Calculation (min): 18 min  Charges: OT General Charges $OT Visit: 1 Visit OT Treatments $Therapeutic Activity: 8-22 mins  Larraine Colas M.S. OTR/L  12/28/23, 1:11 PM

## 2023-12-28 NOTE — Progress Notes (Signed)
 Progress Note   Patient: Monica Conway FMW:[id number] DOB: 1945-12-23 DOA: 12/18/2023     10 DOS: the patient was seen and examined on 12/28/2023   Brief hospital course:  Monica Conway is a 78 y.o. 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  Patient lives at home with assistance of caregivers, who left last night as they usually do.  She then called 911 stating that she was being kidnapped in her own house.  Patient was recently treated with oral antibiotics for UTI outpatient.   In the ED, temperature 97.9 F, HR 110, RR 36, BP 155/116, SpO2 97% on room air.  WBC 16.0, hemoglobin 13.6, platelet count 336.  Sodium 139, potassium 3.6, chloride 110, CO2 12, glucose 60, BUN 39, creatinine 1.44.  AST 75, ALT 32, total bilirubin 0.7.  Lactic acid 1.2.  Urinalysis with large leukocytes, negative nitrite, many bacteria, greater than 50 WBCs.  CT head without contrast with no acute intracranial abnormality.    Pt was admitted for further evaluation and management of acute metabolic encephalopathy felt due to UTI and A-fib with RVR as outlined in detail below.   Assessment and Plan:  Acute metabolic encephalopathy, POA E. coli/Proteus urinary tract infection Patient presenting to the ED with confusion.  Patient afebrile.  Elevated WBC count of 16.0 and urinalysis consistent with UTI. -- Urine culture: + >100K proteus mirabilis and >30K Ecoli -- Blood cultures: No growth x 4 days -- Cefadroxil 500 g p.o. twice daily -- completed course   Paroxysmal atrial fibrillation with RVR HTN At time of presentation to the ED patient was noted to be tachycardic in A-fib with RVR.  On metoprolol   succinate 25 mg p.o. daily outpatient.  No longer on anticoagulation due to history of GI bleed/gastric ulcer.  Initially started on a Cardizem  drip with improvement now discontinued. -- Metoprolol  succinate 50 mg p.o. daily -- Continue Cardizem  CD 180 mg p.o. daily  -- Holding home amlodipine   -- Telemetry   Hypokalemia - recurrent, K 3.1 Hypomagnesemia - recurrent, Mg 1.5 -- oral Kcl 40 mEq x 2 doses -- IV Mg sulfate 4 g x 1 -- monitor & replace electrolytes   Hx gastric ulcer/upper GI bleed -- Protonix  40 m p.o. twice daily   History of CVA Patient with previous history of right basal ganglia CVA 01/2023, no focal deficits.  CT head on admission with no acute intracranial normality, noted chronic right PCA and right basal infarcts.   Cognitive impairment -- Delirium precautions -- Minimize the use of opioids/benzodiazepines -- Seroquel  25 mg PO daily --Increase Seroquel  bedtime dose to 50 mg since patient has been given Haldol -- Melatonin 3 mg pO qHS -- Discontinue IV Haldol --avoid if at all possible, barrier to placement   Weakness/ability/deconditioning: -- PT/OT recommend SNF placement. -- TOC following for placement      Subjective: Pt was awake resting in bed when seen this AM.  She denies complaints.  She states she knows rehab didn't want to accept her due to her acting up.  She reports doing better, and apologizes for being disruptive.       Physical Exam: Vitals:   12/27/23 0513 12/27/23 1419 12/27/23 2119 12/28/23 0521  BP: (!) 133/90 135/74 ROLLEN)  167/77 134/66  Pulse: 66 74 71 63  Resp: 20 18    Temp: 98.1 F (36.7 C) 99 F (37.2 C) 97.9 F (36.6 C) 97.8 F (36.6 C)  TempSrc: Oral Oral Oral Oral  SpO2: 98% 95% 96% 95%  Weight:      Height:       General exam: awake & alert, no acute distress, frail HEENT: moist mucus membranes, hearing grossly normal  Respiratory system: CTAB no wheezes or rhonchi, normal respiratory effort, on room  air Cardiovascular system: normal S1/S2, RRR, no pedal edema.   Central nervous system: A&O x 2+, grossly nonfocal, normal speech Extremities: moves all, no edema, normal tone Skin: dry, intact, normal temperature   Data Reviewed: Notable labs -   K 3.1 Bicarb 17 >> 21 Mg 1.5 Ca 8.3 BUN 7   Family Communication: None present on rounds.     Disposition: Status is: Inpatient Remains inpatient appropriate because: Awaiting SNF placement.  Replacing electrolytes    Planned Discharge Destination: Skilled nursing facility    Time spent: 35 minutes  Author: Burnard DELENA Cunning, DO 12/28/2023 9:46 AM  For on call review [email or web address].

## 2023-12-28 NOTE — TOC Progression Note (Signed)
 Transition of Care [facility]) - Progression Note    Patient Details  Name: Monica Conway MRN: [id number] Date of Birth: [birth year]  Transition of Care [facility]) CM/SW Contact  Tawni CHRISTELLA Eva, LCSW Phone Number: 12/28/2023, 11:31 AM  Clinical Narrative:     CSW spoke with Stephany Maiden with admission with the [facility], she is requesting additional clinicals to be emailed over for review. Care management to follow.    Expected Discharge Plan: Skilled Nursing Facility Barriers to Discharge: Continued Medical Work up               Expected Discharge Plan and Services       Living arrangements for the past 2 months: Single Family Home                                       Social Drivers of Health (SDOH) Interventions SDOH Screenings   Food Insecurity: Patient Unable To Answer (12/18/2023)  Housing: Patient Declined (12/18/2023)  Transportation Needs: Patient Declined (12/18/2023)  Utilities: Patient Declined (12/18/2023)  Social Connections: Patient Declined (12/18/2023)  Tobacco Use: Low Risk  (12/20/2023)    Readmission Risk Interventions    10/26/2023    1:57 PM 04/06/2023    8:44 AM  Readmission Risk Prevention Plan  Post Dischage Appt Complete   Medication Screening Complete   Transportation Screening Complete Complete  PCP or Specialist Appt within 3-5 Days  Complete  HRI or Home Care Consult  Complete  Social Work Consult for Recovery Care Planning/Counseling  Complete  Palliative Care Screening  Not Applicable  Medication Review Oceanographer)  Complete

## 2023-12-29 ENCOUNTER — Encounter (HOSPITAL_COMMUNITY): Payer: Self-pay | Admitting: Internal Medicine

## 2023-12-29 DIAGNOSIS — R4189 Other symptoms and signs involving cognitive functions and awareness: Secondary | ICD-10-CM | POA: Insufficient documentation

## 2023-12-29 DIAGNOSIS — I4891 Unspecified atrial fibrillation: Secondary | ICD-10-CM

## 2023-12-29 DIAGNOSIS — A419 Sepsis, unspecified organism: Secondary | ICD-10-CM | POA: Diagnosis not present

## 2023-12-29 DIAGNOSIS — N390 Urinary tract infection, site not specified: Secondary | ICD-10-CM | POA: Diagnosis not present

## 2023-12-29 LAB — BASIC METABOLIC PANEL WITH GFR
Anion gap: 12 (ref 5–15)
BUN: 9 mg/dL (ref 8–23)
CO2: 21 mmol/L — ABNORMAL LOW (ref 22–32)
Calcium: 8.8 mg/dL — ABNORMAL LOW (ref 8.9–10.3)
Chloride: 104 mmol/L (ref 98–111)
Creatinine, Ser: 0.85 mg/dL (ref 0.44–1.00)
GFR, Estimated: >60 mL/min (ref >60)
Glucose, Bld: 140 mg/dL — ABNORMAL HIGH (ref 70–99)
Potassium: 4.8 mmol/L (ref 3.5–5.1)
Sodium: 137 mmol/L (ref 135–145)

## 2023-12-29 LAB — MAGNESIUM: Magnesium: 2 mg/dL (ref 1.7–2.4)

## 2023-12-29 MED ORDER — MAGNESIUM OXIDE -MG SUPPLEMENT 400 (240 MG) MG PO TABS: 400.0000 mg | ORAL_TABLET | Freq: Every day | ORAL | Status: DC

## 2023-12-29 MED ORDER — QUETIAPINE FUMARATE 25 MG PO TABS: 25.0000 mg | ORAL_TABLET | Freq: Every day | ORAL | Status: DC

## 2023-12-29 MED ORDER — TRAMADOL HCL 50 MG PO TABS: 50.0000 mg | ORAL_TABLET | Freq: Four times a day (QID) | ORAL | 0 refills | Status: DC | PRN

## 2023-12-29 MED ORDER — METOPROLOL SUCCINATE ER 50 MG PO TB24: 50.0000 mg | ORAL_TABLET | Freq: Every day | ORAL | Status: DC

## 2023-12-29 MED ORDER — ENSURE PLUS HIGH PROTEIN PO LIQD: 237.0000 mL | Freq: Two times a day (BID) | ORAL | Status: AC

## 2023-12-29 MED ORDER — POTASSIUM CHLORIDE CRYS ER 20 MEQ PO TBCR: 20.0000 meq | EXTENDED_RELEASE_TABLET | Freq: Every day | ORAL | Status: DC

## 2023-12-29 MED ORDER — DILTIAZEM HCL ER COATED BEADS 180 MG PO CP24: 180.0000 mg | ORAL_CAPSULE | Freq: Every day | ORAL | Status: DC

## 2023-12-29 MED ORDER — SODIUM BICARBONATE 650 MG PO TABS: 650.0000 mg | ORAL_TABLET | Freq: Two times a day (BID) | ORAL | Status: DC

## 2023-12-29 MED ORDER — QUETIAPINE FUMARATE 50 MG PO TABS: 50.0000 mg | ORAL_TABLET | Freq: Every day | ORAL | Status: DC

## 2023-12-29 MED ORDER — ACETAMINOPHEN 500 MG PO TABS: 1000.0000 mg | ORAL_TABLET | Freq: Three times a day (TID) | ORAL | Status: DC

## 2023-12-29 NOTE — TOC Progression Note (Signed)
 Transition of Care [facility]) - Progression Note    Patient Details  Name: Monica Conway MRN: [id number] Date of Birth: 10-16-1945  Transition of Care [facility]) CM/SW Contact  Tawni CHRISTELLA Eva, LCSW Phone Number: 12/29/2023, 9:35 AM  Clinical Narrative:     CSW spoke with [facility] again; they are unable to offer a bed and stated that the facility is full. CSW followed up with [facility]--no answer was received, and a voicemail was left requesting a return call.  CSW messaged Kia at [facility], who confirmed that she can offer the patient a bed. CSW spoke with the pt and the pt's brother to present the bed offer, and they have chosen [facility]. CSW contacted [employer] to update the facility information. Kia reported that the pt can be admitted today. The MD and RN were notified. Care management will continue to follow.  Expected Discharge Plan: Skilled Nursing Facility Barriers to Discharge: Continued Medical Work up               Expected Discharge Plan and Services       Living arrangements for the past 2 months: Single Family Home                                       Social Drivers of Health (SDOH) Interventions SDOH Screenings   Food Insecurity: Patient Unable To Answer (12/18/2023)  Housing: Patient Declined (12/18/2023)  Transportation Needs: Patient Declined (12/18/2023)  Utilities: Patient Declined (12/18/2023)  Social Connections: Patient Declined (12/18/2023)  Tobacco Use: Low Risk  (12/20/2023)    Readmission Risk Interventions    10/26/2023    1:57 PM 04/06/2023    8:44 AM  Readmission Risk Prevention Plan  Post Dischage Appt Complete   Medication Screening Complete   Transportation Screening Complete Complete  PCP or Specialist Appt within 3-5 Days  Complete  HRI or Home Care Consult  Complete  Social Work Consult for Recovery Care Planning/Counseling  Complete  Palliative Care Screening  Not Applicable  Medication  Review Oceanographer)  Complete

## 2023-12-29 NOTE — TOC Transition Note (Signed)
 Transition of Care [facility]) - Discharge Note   Patient Details  Name: Monica Conway MRN: [id number] Date of Birth: 1945-04-01  [facility] of [facility]) CM/SW Contact:  Tawni CHRISTELLA Eva, LCSW Phone Number: 12/29/2023, 11:34 AM   Clinical Narrative:     Pt to d/c to [facility] room 115, RN to call report to [phone]. PTAR called , ICM sign off.   Final next level of care: Skilled Nursing Facility Barriers to Discharge: Barriers Resolved   Patient Goals and CMS Choice Patient states their goals for this hospitalization and ongoing recovery are:: SNf to get stronger          Discharge Placement                    Patient and family notified of of transfer: 12/29/23  Discharge Plan and Services Additional resources added to the After Visit Summary for                                       Social Drivers of Health (SDOH) Interventions SDOH Screenings   Food Insecurity: Patient Unable To Answer (12/18/2023)  Housing: Patient Declined (12/18/2023)  Transportation Needs: Patient Declined (12/18/2023)  Utilities: Patient Declined (12/18/2023)  Social Connections: Patient Declined (12/18/2023)  Tobacco Use: Low Risk  (12/20/2023)     Readmission Risk Interventions    10/26/2023    1:57 PM 04/06/2023    8:44 AM  Readmission Risk Prevention Plan  Post Dischage Appt Complete   Medication Screening Complete   Transportation Screening Complete Complete  PCP or Specialist Appt within 3-5 Days  Complete  HRI or Home Care Consult  Complete  Social Work Consult for Recovery Care Planning/Counseling  Complete  Palliative Care Screening  Not Applicable  Medication Review Oceanographer)  Complete

## 2023-12-29 NOTE — Progress Notes (Signed)
Report called to [facility]. 

## 2023-12-29 NOTE — Discharge Summary (Addendum)
 Physician Discharge Summary   Patient: Monica Conway MRN: [id number] DOB: October 24, 1945  Admit date:     12/18/2023  Discharge date: 12/29/23  Discharge Physician: Burnard DELENA Cunning   PCP: [facility]   Recommendations at discharge:    Follow up with Primary Care in 1-2 weeks Repeat CBC, BMP, Mg at follow up Follow up on BP and HR control with change in therapy Follow up on tolerance and efficacy of increased bedtime Seroquel  dose.  This helped pt to not require as needed medication for the past several nights  Discharge Diagnoses: Active Problems:   Essential hypertension   PAF (paroxysmal atrial fibrillation) (HCC)   History of stroke - 01-2023. felt to be due to afib.   Cognitive impairment  Principal Problem (Resolved):   Sepsis secondary to UTI [facility]) Resolved Problems:   Metabolic acidosis   Hypokalemia   Atrial fibrillation with RVR (HCC)   Hypomagnesemia  Hospital Course:  Monica Conway is a 78 y.o. 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  Patient lives at home with assistance of caregivers, who left last night as they usually do.  She then called 911 stating that she was being kidnapped in her own house.  Patient was recently treated with oral antibiotics for UTI outpatient.   In the ED, temperature 97.9 F, HR 110, RR 36, BP 155/116, SpO2 97% on room air.  WBC 16.0, hemoglobin 13.6, platelet count 336.  Sodium 139, potassium 3.6, chloride 110, CO2 12, glucose 60, BUN 39, creatinine 1.44.  AST 75, ALT 32, total bilirubin 0.7.  Lactic acid 1.2.  Urinalysis with large leukocytes, negative nitrite, many bacteria, greater than 50 WBCs.  CT head  without contrast with no acute intracranial abnormality.     Pt was admitted for further evaluation and management of acute metabolic encephalopathy felt due to UTI and A-fib with RVR as outlined in detail below.    11/4 -- pt doing well, seen during breakfast.  She denies acute complaints.  She did not tolerate much with therapy yesterday due to her pain.  She reports using her lightweight wheelchair getting around at home and when out in stores,saying she uses her feet to propel herself around.  Pt is medically stable for discharge today and is eager for rehab.     Assessment and Plan:   Sepsis due to UTI-meeting criteria with tachycardia, leukocytosis, endorgan dysfunction with AMS.  Patient is hemodynamically stable, with normal lactic acid level.  Patient presenting to the ED with confusion.  Patient afebrile.  Elevated WBC count of 16.0 and urinalysis consistent with UTI. Acute metabolic encephalopathy, POA - resolved Due to UTI E. coli/Proteus urinary tract infection -- Urine culture: + >100K proteus mirabilis and >30K Ecoli -- Blood cultures: No growth -- Cefadroxil 500 g p.o. twice daily -- completed course   Paroxysmal atrial fibrillation with RVR HTN At time of presentation to the ED patient was noted to be tachycardic in A-fib with RVR.  On metoprolol  succinate 25 mg p.o. daily outpatient.  No longer on anticoagulation due to history of GI bleed/gastric ulcer.  Initially started on a Cardizem  drip with improvement now discontinued. -- Metoprolol  succinate 50 mg p.o. daily -- Continue Cardizem  CD 180 mg  p.o. daily  --Amlodipine  was discontinued with addition of Cardizem   AKI - POA, likely due to sepsis. Cr on admission 1.44 (baseline normal 0.7--0.8) Resolved with IV fluids. --Monitor BMP at follow up   Hypokalemia - replaced 11/3 Hypomagnesemia - replaced 11/3 -- Will DC on 20 mill equivalents daily potassium and 400 mg magnesium  oxide --PCP follow-up for repeat labs  in 1 week -- monitor & replace electrolytes   Hx gastric ulcer/upper GI bleed -- Protonix  40 m p.o. twice daily   History of CVA Patient with previous history of right basal ganglia CVA 01/2023, no focal deficits.  CT head on admission with no acute intracranial normality, noted chronic right PCA and right basal infarcts.   Cognitive impairment -- Delirium precautions -- Minimize the use of opioids/benzodiazepines -- Seroquel  25 mg PO daily --Increased Seroquel  bedtime dose to 50 mg (patient has not required Haldol since) -- Melatonin 3 mg pO qHS   Weakness/ability/deconditioning: -- PT/OT recommend SNF placement. -- TOC following for placement         Consultants: None Procedures performed: None  Disposition: Skilled nursing facility Diet recommendation:  Discharge Diet Orders (From admission, onward)     Start     Ordered   12/29/23 0000  Diet - low sodium heart healthy        12/29/23 1104            DISCHARGE MEDICATION: Allergies as of 12/29/2023       Reactions   Pine Shortness Of Breath   Pollen Extract Shortness Of Breath   Caduet [amlodipine -atorvastatin] Other (See Comments)   Unknown reaction        Medication List     STOP taking these medications    amLODipine  5 MG tablet Commonly known as: NORVASC        TAKE these medications    acetaminophen  500 MG tablet Commonly known as: TYLENOL  Take 2 tablets (1,000 mg total) by mouth every 8 (eight) hours.   albuterol  108 (90 Base) MCG/ACT inhaler Commonly known as: VENTOLIN  HFA Inhale 2 puffs into the lungs every 6 (six) hours as needed for wheezing or shortness of breath.   diltiazem  180 MG 24 hr capsule Commonly known as: CARDIZEM  CD Take 1 capsule (180 mg total) by mouth daily.   feeding supplement Liqd Take 237 mLs by mouth 2 (two) times daily between meals.   iron  polysaccharides 150 MG capsule Commonly known as: Nu-Iron  Take 1 capsule (150 mg total) by mouth daily.    lidocaine  5 % Commonly known as: LIDODERM  Place 1 patch onto the skin daily. Remove & Discard patch within 12 hours or as directed by MD   loperamide  2 MG capsule Commonly known as: IMODIUM  Take 1 capsule (2 mg total) by mouth daily as needed for diarrhea or loose stools.   magnesium  oxide 400 (240 Mg) MG tablet Commonly known as: MAG-OX Take 1 tablet (400 mg total) by mouth daily.   melatonin 5 MG Tabs Take 1 tablet (5 mg total) by mouth at bedtime.   metoprolol  succinate 50 MG 24 hr tablet Commonly known as: TOPROL -XL Take 1 tablet (50 mg total) by mouth daily. Take with or immediately following a meal. What changed:  medication strength how much to take additional instructions   Myrbetriq 25 MG Tb24 tablet Generic drug: mirabegron ER Take 25 mg by mouth daily.   pantoprazole  40 MG tablet Commonly known as: PROTONIX  Take 1 tablet (40 mg total) by mouth 2 (two) times daily.  potassium chloride  SA 20 MEQ tablet Commonly known as: KLOR-CON  M Take 1 tablet (20 mEq total) by mouth daily.   QUEtiapine  25 MG tablet Commonly known as: SEROQUEL  Take 1 tablet (25 mg total) by mouth daily at 8 pm. What changed: when to take this   QUEtiapine  50 MG tablet Commonly known as: SEROQUEL  Take 1 tablet (50 mg total) by mouth at bedtime. What changed: You were already taking a medication with the same name, and this prescription was added. Make sure you understand how and when to take each.   sodium bicarbonate  650 MG tablet Take 1 tablet (650 mg total) by mouth 2 (two) times daily.   traMADol  50 MG tablet Commonly known as: ULTRAM  Take 1 tablet (50 mg total) by mouth every 6 (six) hours as needed for moderate pain (pain score 4-6).        Discharge Exam: Filed Weights   12/18/23 0559 12/18/23 1625  Weight: 54.6 kg 41.4 kg   General exam: awake, alert, no acute distress, frail ,in good spirits HEENT: moist mucus membranes, hearing grossly normal  Respiratory system:  CTAB, no wheezes, rales or rhonchi, normal respiratory effort. Cardiovascular system: normal S1/S2,  RRR, no JVD, murmurs, rubs, gallops,  no pedal edema.   Gastrointestinal system: soft, NT, ND, no HSM felt, +bowel sounds. Central nervous system: A&O x2+. no gross focal neurologic deficits, normal speech Extremities: moves all , no edema, normal tone Skin: dry, intact, normal temperature, normal color, No rashes, lesions or ulcers Psychiatry: normal mood, congruent affect, judgement and insight appear normal   Condition at discharge: stable  The results of significant diagnostics from this hospitalization (including imaging, microbiology, ancillary and laboratory) are listed below for reference.   Imaging Studies: CT HEAD WO CONTRAST ( ) Result Date: 12/18/2023 EXAM: CT HEAD WITHOUT CONTRAST 12/18/2023 08:25:29 AM TECHNIQUE: CT of the head was performed without the administration of intravenous contrast. Automated exposure control, iterative reconstruction, and/or weight based adjustment of the mA/kV was utilized to reduce the radiation dose to as low as reasonably achievable. COMPARISON: MRI 01/24/2023 and CT 01/25/2023. CLINICAL HISTORY: 78 year old female. Delirium, confusion, caretakers left, patient called 911 reporting kidnapping. FINDINGS: BRAIN AND VENTRICLES: No acute hemorrhage. No evidence of acute infarct. Chronic lacunar infarct of the anterior right basal ganglia with expected evolution since last year. Chronic right PCA territory infarct. Stable gray white differentiation elsewhere. Stable brain volume. No hydrocephalus. No extra-axial collection. No mass effect or midline shift. Calcified atherosclerosis of the skull base. No suspicious intracranial vascular hyperdensity. ORBITS: No acute abnormality. SINUSES: No acute abnormality. SOFT TISSUES AND SKULL: No acute soft tissue abnormality. No skull fracture. IMPRESSION: 1. No acute intracranial abnormality. 2. Chronic right PCA and  right basal infarcts. Electronically signed by: Helayne Hurst MD 12/18/2023 08:37 AM EDT RP Workstation: [id number]   DG Chest Port 1 View if patient is in a treatment room. Result Date: 12/18/2023 EXAM: 1 VIEW(S) XRAY OF THE CHEST 12/18/2023 06:28:00 AM COMPARISON: 01/24/2023 CLINICAL HISTORY: Suspected Sepsis. Altered mental status, tachycardia, suspected sepsis. FINDINGS: LUNGS AND PLEURA: Opacity within the medial aspect of the apical segment of the right upper lobe corresponds to prominent upper thoracic vasculature. No focal pulmonary opacity. No pulmonary edema. No pleural effusion. No pneumothorax. HEART AND MEDIASTINUM: Atherosclerotic plaque noted. No acute abnormality of the cardiac and mediastinal silhouettes. BONES AND SOFT TISSUES: No acute osseous abnormality. IMPRESSION: 1. No acute cardiopulmonary abnormality identified. Electronically signed by: Waddell Calk MD 12/18/2023 07:10 AM EDT RP  Workstation: [id number]    Microbiology: Results for orders placed or performed during the hospital encounter of 12/18/23  Urine Culture     Status: Abnormal   Collection Time: 12/18/23  6:13 AM   Specimen: Urine, Random  Result Value Ref Range Status   Specimen Description   Final    URINE, RANDOM Performed at [facility], [address], [place], [place] [address]    Special Requests   Final    NONE Reflexed from [id number] Performed at [facility], [address] [place], [place] [address]    Culture (A)  Final    >=100,000 COLONIES/mL PROTEUS MIRABILIS 30,000 COLONIES/mL ESCHERICHIA COLI    Report Status 12/20/2023 FINAL  Final   Organism ID, Bacteria PROTEUS MIRABILIS (A)  Final   Organism ID, Bacteria ESCHERICHIA COLI (A)  Final      Susceptibility   Escherichia coli - MIC*    AMPICILLIN >=32 RESISTANT Resistant     CEFAZOLIN  (URINE) Value in next row Sensitive      2 SENSITIVEThis is a modified FDA-approved test that has been validated and  its performance characteristics determined by the reporting laboratory.  This laboratory is certified under the Clinical Laboratory Improvement Amendments CLIA as qualified to perform high complexity clinical laboratory testing.    CEFEPIME  Value in next row Sensitive      2 SENSITIVEThis is a modified FDA-approved test that has been validated and its performance characteristics determined by the reporting laboratory.  This laboratory is certified under the Clinical Laboratory Improvement Amendments CLIA as qualified to perform high complexity clinical laboratory testing.    ERTAPENEM Value in next row Sensitive      2 SENSITIVEThis is a modified FDA-approved test that has been validated and its performance characteristics determined by the reporting laboratory.  This laboratory is certified under the Clinical Laboratory Improvement Amendments CLIA as qualified to perform high complexity clinical laboratory testing.    CEFTRIAXONE  Value in next row Sensitive      2 SENSITIVEThis is a modified FDA-approved test that has been validated and its performance characteristics determined by the reporting laboratory.  This laboratory is certified under the Clinical Laboratory Improvement Amendments CLIA as qualified to perform high complexity clinical laboratory testing.    CIPROFLOXACIN Value in next row Sensitive      2 SENSITIVEThis is a modified FDA-approved test that has been validated and its performance characteristics determined by the reporting laboratory.  This laboratory is certified under the Clinical Laboratory Improvement Amendments CLIA as qualified to perform high complexity clinical laboratory testing.    GENTAMICIN Value in next row Sensitive      2 SENSITIVEThis is a modified FDA-approved test that has been validated and its performance characteristics determined by the reporting laboratory.  This laboratory is certified under the Clinical Laboratory Improvement Amendments CLIA as qualified to  perform high complexity clinical laboratory testing.    NITROFURANTOIN Value in next row Sensitive      2 SENSITIVEThis is a modified FDA-approved test that has been validated and its performance characteristics determined by the reporting laboratory.  This laboratory is certified under the Clinical Laboratory Improvement Amendments CLIA as qualified to perform high complexity clinical laboratory testing.    TRIMETH/SULFA Value in next row Sensitive      2 SENSITIVEThis is a modified FDA-approved test that has been validated and its performance characteristics determined by the reporting laboratory.  This laboratory is certified under the Clinical Laboratory Improvement Amendments CLIA  as qualified to perform high complexity clinical laboratory testing.    AMPICILLIN/SULBACTAM Value in next row Intermediate      2 SENSITIVEThis is a modified FDA-approved test that has been validated and its performance characteristics determined by the reporting laboratory.  This laboratory is certified under the Clinical Laboratory Improvement Amendments CLIA as qualified to perform high complexity clinical laboratory testing.    PIP/TAZO Value in next row Sensitive      <=4 SENSITIVEThis is a modified FDA-approved test that has been validated and its performance characteristics determined by the reporting laboratory.  This laboratory is certified under the Clinical Laboratory Improvement Amendments CLIA as qualified to perform high complexity clinical laboratory testing.    MEROPENEM Value in next row Sensitive      <=4 SENSITIVEThis is a modified FDA-approved test that has been validated and its performance characteristics determined by the reporting laboratory.  This laboratory is certified under the Clinical Laboratory Improvement Amendments CLIA as qualified to perform high complexity clinical laboratory testing.    * 30,000 COLONIES/mL ESCHERICHIA COLI   Proteus mirabilis - MIC*    AMPICILLIN Value in next row  Sensitive      <=4 SENSITIVEThis is a modified FDA-approved test that has been validated and its performance characteristics determined by the reporting laboratory.  This laboratory is certified under the Clinical Laboratory Improvement Amendments CLIA as qualified to perform high complexity clinical laboratory testing.    CEFAZOLIN  (URINE) Value in next row Sensitive      4 SENSITIVEThis is a modified FDA-approved test that has been validated and its performance characteristics determined by the reporting laboratory.  This laboratory is certified under the Clinical Laboratory Improvement Amendments CLIA as qualified to perform high complexity clinical laboratory testing.    CEFEPIME  Value in next row Sensitive      4 SENSITIVEThis is a modified FDA-approved test that has been validated and its performance characteristics determined by the reporting laboratory.  This laboratory is certified under the Clinical Laboratory Improvement Amendments CLIA as qualified to perform high complexity clinical laboratory testing.    ERTAPENEM Value in next row Sensitive      4 SENSITIVEThis is a modified FDA-approved test that has been validated and its performance characteristics determined by the reporting laboratory.  This laboratory is certified under the Clinical Laboratory Improvement Amendments CLIA as qualified to perform high complexity clinical laboratory testing.    CEFTRIAXONE  Value in next row Sensitive      4 SENSITIVEThis is a modified FDA-approved test that has been validated and its performance characteristics determined by the reporting laboratory.  This laboratory is certified under the Clinical Laboratory Improvement Amendments CLIA as qualified to perform high complexity clinical laboratory testing.    CIPROFLOXACIN Value in next row Resistant      4 SENSITIVEThis is a modified FDA-approved test that has been validated and its performance characteristics determined by the reporting laboratory.  This  laboratory is certified under the Clinical Laboratory Improvement Amendments CLIA as qualified to perform high complexity clinical laboratory testing.    GENTAMICIN Value in next row Sensitive      4 SENSITIVEThis is a modified FDA-approved test that has been validated and its performance characteristics determined by the reporting laboratory.  This laboratory is certified under the Clinical Laboratory Improvement Amendments CLIA as qualified to perform high complexity clinical laboratory testing.    NITROFURANTOIN Value in next row Resistant      4 SENSITIVEThis is a modified FDA-approved test that has been  validated and its performance characteristics determined by the reporting laboratory.  This laboratory is certified under the Clinical Laboratory Improvement Amendments CLIA as qualified to perform high complexity clinical laboratory testing.    TRIMETH/SULFA Value in next row Sensitive      4 SENSITIVEThis is a modified FDA-approved test that has been validated and its performance characteristics determined by the reporting laboratory.  This laboratory is certified under the Clinical Laboratory Improvement Amendments CLIA as qualified to perform high complexity clinical laboratory testing.    AMPICILLIN/SULBACTAM Value in next row Sensitive      4 SENSITIVEThis is a modified FDA-approved test that has been validated and its performance characteristics determined by the reporting laboratory.  This laboratory is certified under the Clinical Laboratory Improvement Amendments CLIA as qualified to perform high complexity clinical laboratory testing.    PIP/TAZO Value in next row Sensitive      <=4 SENSITIVEThis is a modified FDA-approved test that has been validated and its performance characteristics determined by the reporting laboratory.  This laboratory is certified under the Clinical Laboratory Improvement Amendments CLIA as qualified to perform high complexity clinical laboratory testing.     MEROPENEM Value in next row Sensitive      <=4 SENSITIVEThis is a modified FDA-approved test that has been validated and its performance characteristics determined by the reporting laboratory.  This laboratory is certified under the Clinical Laboratory Improvement Amendments CLIA as qualified to perform high complexity clinical laboratory testing.    * >=100,000 COLONIES/mL PROTEUS MIRABILIS  Culture, blood (Routine x 2)     Status: None   Collection Time: 12/18/23  7:11 AM   Specimen: BLOOD  Result Value Ref Range Status   Specimen Description   Final    BLOOD RIGHT ANTECUBITAL Performed at [facility], [address], [place], [place] [address]    Special Requests   Final    BOTTLES DRAWN AEROBIC AND ANAEROBIC Blood Culture adequate volume Performed at [facility], [address] [place], [place] [address]    Culture   Final    NO GROWTH 5 DAYS Performed at [facility], [address] [place], [place] [address]    Report Status 12/23/2023 FINAL  Final  Culture, blood (Routine x 2)     Status: None   Collection Time: 12/18/23  9:01 AM   Specimen: BLOOD  Result Value Ref Range Status   Specimen Description   Final    BLOOD LEFT ANTECUBITAL Performed at [facility], [address] [place], [place] [address]    Special Requests   Final    BOTTLES DRAWN AEROBIC AND ANAEROBIC Blood Culture adequate volume Performed at [facility], [address] [place], [place] [address]    Culture   Final    NO GROWTH 5 DAYS Performed at [facility], [address] [place], [place] [address]    Report Status 12/23/2023 FINAL  Final    Labs: CBC: Recent Labs  Lab 12/23/23 0411  WBC 10.5  HGB 11.0*  HCT 35.7*  MCV 96.0  PLT 328   Basic Metabolic Panel: Recent Labs  Lab 12/23/23 0411 12/25/23 1034 12/28/23 0356 12/29/23 0929  NA 136 134* 140 137  K 3.8 3.7 3.1* 4.8  CL 112* 105 107  104  CO2 17* 17* 21* 21*  GLUCOSE 108* 125* 98 140*  BUN 6* 7* 7* 9  CREATININE 0.66 0.77 0.69 0.85  CALCIUM  7.9* 8.2*  8.3* 8.8*  MG 2.2 1.7 1.5*  --   PHOS 2.8  --   --   --    Liver Function Tests: No results for input(s): AST, ALT, ALKPHOS, BILITOT, PROT, ALBUMIN in the last 168 hours. CBG: No results for input(s): GLUCAP in the last 168 hours.  Discharge time spent: less than 30 minutes.  Signed: Burnard DELENA Cunning, DO [facility] 12/29/2023

## 2024-03-03 ENCOUNTER — Emergency Department (HOSPITAL_COMMUNITY)

## 2024-03-03 ENCOUNTER — Other Ambulatory Visit: Payer: Self-pay

## 2024-03-03 ENCOUNTER — Inpatient Hospital Stay (HOSPITAL_COMMUNITY)
Admission: EM | Admit: 2024-03-03 | Discharge: 2024-03-15 | DRG: 682 | Disposition: A | Discharge status: Home / Self Care | Attending: Internal Medicine | Admitting: Internal Medicine

## 2024-03-03 DIAGNOSIS — Z8249 Family history of ischemic heart disease and other diseases of the circulatory system: Secondary | ICD-10-CM | POA: Diagnosis not present

## 2024-03-03 DIAGNOSIS — Z83518 Family history of other specified eye disorder: Secondary | ICD-10-CM

## 2024-03-03 DIAGNOSIS — E876 Hypokalemia: Secondary | ICD-10-CM | POA: Diagnosis present

## 2024-03-03 DIAGNOSIS — N390 Urinary tract infection, site not specified: Secondary | ICD-10-CM | POA: Diagnosis present

## 2024-03-03 DIAGNOSIS — M810 Age-related osteoporosis without current pathological fracture: Secondary | ICD-10-CM | POA: Diagnosis present

## 2024-03-03 DIAGNOSIS — I480 Paroxysmal atrial fibrillation: Secondary | ICD-10-CM | POA: Diagnosis present

## 2024-03-03 DIAGNOSIS — I509 Heart failure, unspecified: Secondary | ICD-10-CM | POA: Diagnosis present

## 2024-03-03 DIAGNOSIS — R7989 Other specified abnormal findings of blood chemistry: Secondary | ICD-10-CM | POA: Diagnosis not present

## 2024-03-03 DIAGNOSIS — R627 Adult failure to thrive: Secondary | ICD-10-CM | POA: Diagnosis present

## 2024-03-03 DIAGNOSIS — R451 Restlessness and agitation: Secondary | ICD-10-CM | POA: Diagnosis present

## 2024-03-03 DIAGNOSIS — B9689 Other specified bacterial agents as the cause of diseases classified elsewhere: Secondary | ICD-10-CM | POA: Diagnosis present

## 2024-03-03 DIAGNOSIS — I130 Hypertensive heart and chronic kidney disease with heart failure and stage 1 through stage 4 chronic kidney disease, or unspecified chronic kidney disease: Secondary | ICD-10-CM | POA: Diagnosis present

## 2024-03-03 DIAGNOSIS — E872 Acidosis, unspecified: Secondary | ICD-10-CM | POA: Diagnosis present

## 2024-03-03 DIAGNOSIS — K2270 Barrett's esophagus without dysplasia: Secondary | ICD-10-CM | POA: Diagnosis present

## 2024-03-03 DIAGNOSIS — Z66 Do not resuscitate: Secondary | ICD-10-CM | POA: Diagnosis present

## 2024-03-03 DIAGNOSIS — N179 Acute kidney failure, unspecified: Secondary | ICD-10-CM | POA: Diagnosis present

## 2024-03-03 DIAGNOSIS — Z681 Body mass index (BMI) 19 or less, adult: Secondary | ICD-10-CM

## 2024-03-03 DIAGNOSIS — Z91048 Other nonmedicinal substance allergy status: Secondary | ICD-10-CM

## 2024-03-03 DIAGNOSIS — Z515 Encounter for palliative care: Secondary | ICD-10-CM | POA: Diagnosis not present

## 2024-03-03 DIAGNOSIS — Z96642 Presence of left artificial hip joint: Secondary | ICD-10-CM | POA: Diagnosis present

## 2024-03-03 DIAGNOSIS — R4189 Other symptoms and signs involving cognitive functions and awareness: Secondary | ICD-10-CM | POA: Diagnosis present

## 2024-03-03 DIAGNOSIS — E861 Hypovolemia: Secondary | ICD-10-CM | POA: Diagnosis present

## 2024-03-03 DIAGNOSIS — E860 Dehydration: Secondary | ICD-10-CM | POA: Diagnosis present

## 2024-03-03 DIAGNOSIS — Z8673 Personal history of transient ischemic attack (TIA), and cerebral infarction without residual deficits: Secondary | ICD-10-CM | POA: Diagnosis not present

## 2024-03-03 DIAGNOSIS — R54 Age-related physical debility: Secondary | ICD-10-CM | POA: Diagnosis present

## 2024-03-03 DIAGNOSIS — J45909 Unspecified asthma, uncomplicated: Secondary | ICD-10-CM | POA: Diagnosis present

## 2024-03-03 DIAGNOSIS — E43 Unspecified severe protein-calorie malnutrition: Secondary | ICD-10-CM | POA: Diagnosis present

## 2024-03-03 DIAGNOSIS — Z888 Allergy status to other drugs, medicaments and biological substances status: Secondary | ICD-10-CM

## 2024-03-03 DIAGNOSIS — N1831 Chronic kidney disease, stage 3a: Secondary | ICD-10-CM | POA: Diagnosis present

## 2024-03-03 DIAGNOSIS — Z79899 Other long term (current) drug therapy: Secondary | ICD-10-CM

## 2024-03-03 DIAGNOSIS — M170 Bilateral primary osteoarthritis of knee: Secondary | ICD-10-CM | POA: Diagnosis present

## 2024-03-03 DIAGNOSIS — R600 Localized edema: Secondary | ICD-10-CM | POA: Diagnosis not present

## 2024-03-03 LAB — CBC WITH DIFFERENTIAL/PLATELET
Abs Immature Granulocytes: 0.08 K/uL — ABNORMAL HIGH (ref 0.00–0.07)
Basophils Absolute: 0 K/uL (ref 0.0–0.1)
Basophils Relative: 0 %
Eosinophils Absolute: 0 K/uL (ref 0.0–0.5)
Eosinophils Relative: 0 %
HCT: 45 % (ref 36.0–46.0)
Hemoglobin: 15.1 g/dL — ABNORMAL HIGH (ref 12.0–15.0)
Immature Granulocytes: 1 %
Lymphocytes Relative: 5 %
Lymphs Abs: 0.7 K/uL (ref 0.7–4.0)
MCH: 32.2 pg (ref 26.0–34.0)
MCHC: 33.6 g/dL (ref 30.0–36.0)
MCV: 95.9 fL (ref 80.0–100.0)
Monocytes Absolute: 1 K/uL (ref 0.1–1.0)
Monocytes Relative: 7 %
Neutro Abs: 11.7 K/uL — ABNORMAL HIGH (ref 1.7–7.7)
Neutrophils Relative %: 87 %
Platelets: 360 K/uL (ref 150–400)
RBC: 4.69 MIL/uL (ref 3.87–5.11)
RDW: 19.8 % — ABNORMAL HIGH (ref 11.5–15.5)
WBC: 13.5 K/uL — ABNORMAL HIGH (ref 4.0–10.5)
nRBC: 0 % (ref 0.0–0.2)

## 2024-03-03 LAB — COMPREHENSIVE METABOLIC PANEL WITH GFR
ALT: 19 U/L (ref 0–44)
AST: 30 U/L (ref 15–41)
Albumin: 3.3 g/dL — ABNORMAL LOW (ref 3.5–5.0)
Alkaline Phosphatase: 108 U/L (ref 38–126)
Anion gap: 22 — ABNORMAL HIGH (ref 5–15)
BUN: 34 mg/dL — ABNORMAL HIGH (ref 8–23)
CO2: 14 mmol/L — ABNORMAL LOW (ref 22–32)
Calcium: 7 mg/dL — ABNORMAL LOW (ref 8.9–10.3)
Chloride: 99 mmol/L (ref 98–111)
Creatinine, Ser: 2.42 mg/dL — ABNORMAL HIGH (ref 0.44–1.00)
GFR, Estimated: 20 mL/min — ABNORMAL LOW
Glucose, Bld: 84 mg/dL (ref 70–99)
Potassium: 2.4 mmol/L — CL (ref 3.5–5.1)
Sodium: 136 mmol/L (ref 135–145)
Total Bilirubin: 0.6 mg/dL (ref 0.0–1.2)
Total Protein: 7.3 g/dL (ref 6.5–8.1)

## 2024-03-03 LAB — TROPONIN T, HIGH SENSITIVITY
Troponin T High Sensitivity: 113 ng/L (ref 0–19)
Troponin T High Sensitivity: 111 ng/L (ref 0–19)

## 2024-03-03 LAB — PRO BRAIN NATRIURETIC PEPTIDE: Pro Brain Natriuretic Peptide: 3427 pg/mL — ABNORMAL HIGH

## 2024-03-03 LAB — URINALYSIS, ROUTINE W REFLEX MICROSCOPIC
Glucose, UA: NEGATIVE mg/dL
Ketones, ur: NEGATIVE mg/dL
Nitrite: NEGATIVE
Protein, ur: 100 mg/dL — AB
Specific Gravity, Urine: 1.025 (ref 1.005–1.030)
pH: 6 (ref 5.0–8.0)

## 2024-03-03 LAB — MAGNESIUM: Magnesium: 1.2 mg/dL — ABNORMAL LOW (ref 1.7–2.4)

## 2024-03-03 LAB — TSH: TSH: 2 u[IU]/mL (ref 0.350–4.500)

## 2024-03-03 LAB — URINALYSIS, MICROSCOPIC (REFLEX): WBC, UA: >50 WBC/hpf (ref 0–5)

## 2024-03-03 MED ORDER — ACETAMINOPHEN 500 MG PO TABS
1000.0000 mg | ORAL_TABLET | Freq: Once | ORAL | Status: AC
  Administered 2024-03-03: 1000 mg via ORAL
  Filled 2024-03-03: qty 2

## 2024-03-03 MED ORDER — LACTATED RINGERS IV BOLUS
500.0000 mL | Freq: Once | INTRAVENOUS | Status: AC
  Administered 2024-03-03: 500 mL via INTRAVENOUS

## 2024-03-03 MED ORDER — MAGNESIUM SULFATE 4 GM/100ML IV SOLN
4.0000 g | Freq: Once | INTRAVENOUS | Status: AC
  Administered 2024-03-03: 4 g via INTRAVENOUS
  Filled 2024-03-03: qty 100

## 2024-03-03 MED ORDER — POTASSIUM CHLORIDE 10 MEQ/100ML IV SOLN
10.0000 meq | INTRAVENOUS | Status: AC
  Administered 2024-03-03 – 2024-03-04 (×6): 10 meq via INTRAVENOUS
  Filled 2024-03-03 (×4): qty 100

## 2024-03-03 MED ORDER — POTASSIUM CHLORIDE 10 MEQ/100ML IV SOLN
10.0000 meq | INTRAVENOUS | Status: DC
  Filled 2024-03-03 (×2): qty 100

## 2024-03-03 NOTE — ED Triage Notes (Signed)
 Pt BIB [facility] from home, patient lives alone at home. Bilateral lower leg edema progressively getting worse. EMS called earlier this morning was disorientation and agitation but refused transport, was A&O x4 when EMS was there. EMS called again this evening by home health nurse for possible fall and was not answering the door. Pt was not found on the floor and no injuries noted.

## 2024-03-03 NOTE — ED Provider Notes (Signed)
 " [employer] [facility]MENT AT [facility] Provider Note   HPI/ROS    History obtained from patient and nursing.  Monica Conway is a 79 y.o. 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  Patient presents today via EMS for complaints of bilateral lower extremity edema.  Initially EMS was called out this morning for the patient being disoriented and agitated, but when EMS got there the patient was A/OX4 and refused transport.  Home health called EMS again this evening because they were concerned the patient might of fallen.  When EMS got there she was resting in her house with no signs of trauma or falls.  She ultimately agreed to transport to the hospital this time for her lower extremity swelling.  On arrival she is A/OX4 and answering questions appropriately.  States that she did not come to the hospital this morning because she did not want to.  Does not know completely why she was confused this morning, but remembers EMS being there and refusing transport.  States that her knees and lower extremities have been swollen for months now and states they have been somewhat worse over the last several weeks.  Denies any fevers, chills, chest pain, shortness breath, nausea, vomiting, diarrhea.   MDM   I have reviewed the nursing documentation, vital signs, as well as the past medical history, surgical history, family history, and social history.  Initial Assessment:  Patient hemodynamically stable on initial  evaluation.  Is alert and oriented and currently denies any complaints aside from lower extremity pain from the swelling.  Appears mildly hypervolemic on exam with lower extremity edema, but has dry mucous membranes and also appears hypovolemic.  States she has been eating well, but has poor skin turgor and just appears chronically ill.  Denies any true falls today, and has no signs of external trauma.  Will still obtain chest x-ray and head CT given patient was transiently confused.  Also obtain full sepsis workup as well as magnesium , troponin, and BNP.  No overt signs of infection externally, but has a history of UTIs.  Head CT and chest x-ray unremarkable for acute injury, agree with radiology.  Initial troponin elevated to 113 with a delta to 111.  BNP elevated to 3427 as well.  CBC with mild leukocytosis with left shift.  Feel the patient likely has UTI, but has no urine on bladder scan.  Will give fluids to rehydrate and try to produce urine.  Feel that she is extra vascularly overloaded but intravascularly depleted.  Has severe hypokalemia to 2.4, hypomagnesemia to 1.2, and acute renal failure.  Feel this is likely secondary to prerenal AKI.  Will give patient 4 g of mag and 6 runs of potassium.  Will also give Tylenol  for lower extremity pain.  Would like to give her more pain meds in this, but very sleepy and do not want to further sedate the patient.  Given severe electrolyte abnormalities feel patient needs admission.  Will continue to rehydrate and try to get urine to evaluate for possible UTI.  No signs of pneumonia on chest x-ray.  Will plan for admission to medicine for further  workup and management.  Will continue to try get urine as possible.  Discussed holding off on antibiotics until urine was obtained with inpatient team and they are okay with that.  Patient admitted to Dr. Shona for further workup and management.  Disposition:  I discussed the case with Dr.Hall who graciously agreed to  admit the patient to their service for continued care.    This patient was staffed with Dr. Patt who supervised the visit and agreed with the plan of care.   Due to the patients current presenting symptoms, physical exam findings, and the workup stated above, it is thought that the etiology of the patients current presentation is:  1. AKI (acute kidney injury)   2. Dehydration   3. Hypokalemia   4. Hypomagnesemia   5. Acute on chronic congestive heart failure, unspecified heart failure type (HCC)     Clinical Complexity A medically appropriate history, review of systems, and physical exam was performed.  Factors that affect the complexity of this encounter: assessment of correct protocol, laboratory work from this visit, notes from other physicians (internal medicine), and review of echocardiogram/EKG results  My independent interpretations of diagnostic studies are documented in the ED course above.   If decision rules were used in this patient's evaluation, they are listed below.   Click here for ABCD2, HEART and other calculators  Patient's presentation is most consistent with acute presentation with potential threat to life or bodily function.  MDM generated using voice dictation software and may contain dictation errors. Please contact me for any clarification or with any questions.    Physical Exam, PMH, PSH, Family History, and Social Hsitory   Vitals:   03/03/24 1639 03/03/24 2004 03/03/24 2024 03/03/24 2115  BP: (!) 157/98 (!) 143/87  (!) 148/88  Pulse: 95 91  85  Resp: (!) 22 19  (!) 21  Temp: 97.7 F (36.5 C)  98.3 F (36.8 C)   TempSrc: Oral  Oral   SpO2: 100% 100%  99%  Weight:      Height:        Physical Exam Constitutional:      Appearance: She is ill-appearing.  HENT:     Head: Normocephalic and atraumatic.     Nose: Nose normal.     Mouth/Throat:     Mouth: Mucous membranes are dry.  Eyes:     Conjunctiva/sclera: Conjunctivae normal.   Cardiovascular:     Rate and Rhythm: Normal rate and regular rhythm.  Pulmonary:     Effort: Pulmonary effort is normal.     Breath sounds: Normal breath sounds. No wheezing or rales.  Abdominal:     General: Abdomen is flat.     Palpations: Abdomen is soft.     Tenderness: There is no abdominal tenderness. There is no guarding.  Musculoskeletal:     Right lower leg: Edema present.     Left lower leg: Edema present.  Skin:    General: Skin is warm and dry.     Capillary Refill: Capillary refill takes less than 2 seconds.     Comments: Small bruise on left anterior shin  Neurological:     General: No focal deficit present.     Mental Status: She is oriented to person, place, and time.     Past Medical History:  Diagnosis Date   Arthritis    Asthma    Atrial fibrillation with RVR (HCC) 12/29/2023   Blood transfusion without reported diagnosis    Cerebrovascular accident (  CVA) (HCC) 01/31/2023   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 (HCC) 01/26/2023   Closed left hip fracture (HCC) 08/03/2015   Hypertension    Hypokalemia 04/08/2023   Hypomagnesemia 12/29/2023   Metabolic acidosis 01/26/2023   Osteoporosis    Sepsis secondary to UTI (HCC) 12/18/2023     Past Surgical History:  Procedure Laterality Date   COLON SURGERY     ESOPHAGOGASTRODUODENOSCOPY N/A 04/04/2023   Procedure: ESOPHAGOGASTRODUODENOSCOPY (EGD);  Surgeon: Rosalie Kitchens, MD;  Location: [facility];  Service: Gastroenterology;  Laterality: N/A;   TOTAL HIP ARTHROPLASTY Left 08/03/2015   Procedure: TOTAL LEFT HIP ARTHROPLASTY ANTERIOR APPROACH;  Surgeon: Redell Shoals, MD;  Location: [facility];  Service: Orthopedics;  Laterality: Left;   TUBAL LIGATION       Family History  Problem Relation Age of Onset   Cataracts Sister    Hypertension Sister    Hypertension Brother     Social History   Tobacco Use   Smoking status: Never   Smokeless tobacco: Never  Substance Use Topics   Alcohol  use:  No     Procedures   If procedures were preformed on this patient, they are listed below:  Procedures   Electronically signed by:   Glendia Carlin Ancona, M.D. PGY-2, Emergency Medicine   Please note that this documentation was produced with the assistance of voice-to-text technology and may contain errors.    Ancona Glendia, MD 03/03/24 2241    Patt Alm Macho, MD 03/03/24 [phone]  "

## 2024-03-03 NOTE — ED Notes (Signed)
 In and out unsucessful. No urine output.

## 2024-03-04 ENCOUNTER — Encounter (HOSPITAL_COMMUNITY): Payer: Self-pay | Admitting: Internal Medicine

## 2024-03-04 ENCOUNTER — Inpatient Hospital Stay (HOSPITAL_COMMUNITY)

## 2024-03-04 DIAGNOSIS — R600 Localized edema: Secondary | ICD-10-CM | POA: Diagnosis not present

## 2024-03-04 DIAGNOSIS — R7989 Other specified abnormal findings of blood chemistry: Secondary | ICD-10-CM | POA: Diagnosis not present

## 2024-03-04 DIAGNOSIS — I509 Heart failure, unspecified: Secondary | ICD-10-CM

## 2024-03-04 DIAGNOSIS — R627 Adult failure to thrive: Secondary | ICD-10-CM | POA: Diagnosis not present

## 2024-03-04 LAB — COMPREHENSIVE METABOLIC PANEL WITH GFR
ALT: 12 U/L (ref 0–44)
AST: 27 U/L (ref 15–41)
Albumin: 2.1 g/dL — ABNORMAL LOW (ref 3.5–5.0)
Alkaline Phosphatase: 68 U/L (ref 38–126)
Anion gap: 16 — ABNORMAL HIGH (ref 5–15)
BUN: 25 mg/dL — ABNORMAL HIGH (ref 8–23)
CO2: 10 mmol/L — ABNORMAL LOW (ref 22–32)
Calcium: 5.3 mg/dL — CL (ref 8.9–10.3)
Chloride: 114 mmol/L — ABNORMAL HIGH (ref 98–111)
Creatinine, Ser: 1.45 mg/dL — ABNORMAL HIGH (ref 0.44–1.00)
GFR, Estimated: 37 mL/min — ABNORMAL LOW
Glucose, Bld: 60 mg/dL — ABNORMAL LOW (ref 70–99)
Potassium: 2.4 mmol/L — CL (ref 3.5–5.1)
Sodium: 140 mmol/L (ref 135–145)
Total Bilirubin: 0.4 mg/dL (ref 0.0–1.2)
Total Protein: 4.6 g/dL — ABNORMAL LOW (ref 6.5–8.1)

## 2024-03-04 LAB — ECHOCARDIOGRAM COMPLETE
AR max vel: 0.85 cm2
AV Area VTI: 1.2 cm2
AV Area mean vel: 0.86 cm2
AV Mean grad: 9 mmHg
AV Peak grad: 20.8 mmHg
Ao pk vel: 2.28 m/s
Area-P 1/2: 2.9 cm2
Height: 62 in
Weight: 1460.33 [oz_av]

## 2024-03-04 LAB — MAGNESIUM: Magnesium: 3.2 mg/dL — ABNORMAL HIGH (ref 1.7–2.4)

## 2024-03-04 MED ORDER — MAGNESIUM SULFATE 4 GM/100ML IV SOLN
4.0000 g | Freq: Once | INTRAVENOUS | Status: AC
  Administered 2024-03-04: 4 g via INTRAVENOUS
  Filled 2024-03-04: qty 100

## 2024-03-04 MED ORDER — LACTATED RINGERS IV SOLN: INTRAVENOUS | Status: AC

## 2024-03-04 MED ORDER — MELATONIN 5 MG PO TABS
5.0000 mg | ORAL_TABLET | Freq: Every evening | ORAL | Status: DC | PRN
  Administered 2024-03-05 – 2024-03-14 (×6): 5 mg via ORAL
  Filled 2024-03-04 (×8): qty 1

## 2024-03-04 MED ORDER — ENOXAPARIN SODIUM 300 MG/3ML IJ SOLN
20.0000 mg | INTRAMUSCULAR | Status: DC
  Filled 2024-03-04: qty 0.2

## 2024-03-04 MED ORDER — PROCHLORPERAZINE EDISYLATE 10 MG/2ML IJ SOLN
5.0000 mg | Freq: Four times a day (QID) | INTRAMUSCULAR | Status: DC | PRN
  Administered 2024-03-06 – 2024-03-12 (×2): 5 mg via INTRAVENOUS
  Filled 2024-03-04 (×2): qty 2

## 2024-03-04 MED ORDER — POLYETHYLENE GLYCOL 3350 17 G PO PACK: 17.0000 g | PACK | Freq: Every day | ORAL | Status: DC | PRN

## 2024-03-04 MED ORDER — ACETAMINOPHEN 500 MG PO TABS
500.0000 mg | ORAL_TABLET | Freq: Four times a day (QID) | ORAL | Status: DC | PRN
  Administered 2024-03-05 – 2024-03-06 (×2): 500 mg via ORAL
  Filled 2024-03-04 (×2): qty 1

## 2024-03-04 MED ORDER — POTASSIUM CHLORIDE CRYS ER 20 MEQ PO TBCR: 20.0000 meq | EXTENDED_RELEASE_TABLET | Freq: Once | ORAL | Status: DC

## 2024-03-04 MED ORDER — CALCIUM GLUCONATE-NACL 2-0.675 GM/100ML-% IV SOLN
2.0000 g | Freq: Once | INTRAVENOUS | Status: AC
  Administered 2024-03-04: 2000 mg via INTRAVENOUS
  Filled 2024-03-04: qty 100

## 2024-03-04 MED ORDER — POTASSIUM CHLORIDE 10 MEQ/100ML IV SOLN
10.0000 meq | INTRAVENOUS | Status: AC
  Administered 2024-03-04 (×5): 10 meq via INTRAVENOUS
  Filled 2024-03-04 (×5): qty 100

## 2024-03-04 NOTE — Progress Notes (Signed)
 Echocardiogram 2D Echocardiogram has been performed.  Monica Conway 03/04/2024, 10:33 AM

## 2024-03-04 NOTE — ED Notes (Signed)
 Dr. Drusilla informed via secure chat of critical lab readings of potassium 2.4 and calcium  5.3.

## 2024-03-04 NOTE — Progress Notes (Signed)
 Subjective: Patient admitted this morning, see detailed H&P by Dr Shona 79 y.o. female with medical history significant for paroxysmal A-fib not on anticoagulation due to history of GI bleed, hypertension, history of CVA, Barrett's esophagus, osteoporosis, bilateral knee osteoarthritis, severe protein calorie malnutrition, presents to the ER due to failure to thrive in an adult.  Associated with generalized weakness and poor oral intake.  Also endorses worsening legs swelling for the past several weeks.  Denies chest pain, shortness of breath, or palpitations.    Vitals:   03/04/24 0629 03/04/24 0700  BP:  (!) 154/95  Pulse:  77  Resp:  16  Temp: 98 F (36.7 C)   SpO2:  99%      A/P  Failure to thrive in adult BMI 16 Severe protein mass loss. Albumin 3.3 Liberalize diet Nutritionist consult   Bilateral lower extremity edema Elevated proBNP 3427 Follow transthoracic echocardiogram. Monitor strict I's and O's and daily weight   AKI, prerenal in the setting of poor oral intake Presented with creatinine of 2.42 and BUN of 24 and GFR of 20 Baseline creatinine 0.8 with GFR greater than 60 Monitor urine output Repeat BMP in the morning   High anion gap metabolic acidosis secondary to acute renal insufficiency Serum bicarb 14, anion gap 22 Continue to treat underlying conditions Repeat BMP in the morning.   Elevated troponin, suspect demand ischemia No anginal symptoms reported No evidence of acute ischemia on twelve-lead EKG Presented with troponin 113, repeat 111. Continue to monitor on telemetry.   Generalized weakness PT OT evaluation Fall precautions   History of CVA Resume home regimen   Hypokalemia Serum potassium 2.4 Will give potassium supplementation -Follow potassium level in a.m.  Hypocalcemia -Corrected calcium  6.8  - Check ionized calcium  and replace as needed   Hypomagnesemia Replete       [name] [facility]

## 2024-03-04 NOTE — H&P (Addendum)
 " History and Physical  Monica Conway FMW:[id number] DOB: [birth year] DOA: 03/03/2024  Referring physician: Dr. Guillermina, Resident-EDP  PCP: [facility]  Outpatient Specialists: GI. Patient coming from: Home,, lives alone, receives home health assistance.  Chief Complaint: Failure to thrive, generalized weakness.  HPI: Monica Conway is a 79 y.o. female with medical history significant for paroxysmal A-fib not on anticoagulation due to history of GI bleed, hypertension, history of CVA, Barrett's esophagus, osteoporosis, bilateral knee osteoarthritis, severe protein calorie malnutrition, presents to the ER due to failure to thrive in an adult.  Associated with generalized weakness and poor oral intake.  Also endorses worsening legs swelling for the past several weeks.  Denies chest pain, shortness of breath, or palpitations.  In the ER, labs are concerning for electrolytes disturbances and AKI, likely prerenal in the setting of poor oral intake.  High-sensitivity troponin elevated and flat, 113, 111.  The patient received IV fluid, IV magnesium , and IV potassium.  TRH, hospitalist service, was asked to admit for further management of electrolytes disturbances, AKI, and elevated troponin.  ED Course: Temperature 98.3.  BP 134/76, pulse 69, respiration rate 15, O2 saturation 98% on room air.  Review of Systems: Review of systems as noted in the HPI. All other systems reviewed and are negative.   Past Medical History:  Diagnosis Date   Arthritis    Asthma    Atrial fibrillation with RVR (HCC) 12/29/2023   Blood transfusion without reported diagnosis    Cerebrovascular accident (CVA) (HCC) 01/31/2023   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 (HCC) 01/26/2023   Closed left hip fracture (HCC) 08/03/2015   Hypertension    Hypokalemia 04/08/2023   Hypomagnesemia 12/29/2023   Metabolic acidosis 01/26/2023   Osteoporosis    Sepsis secondary to UTI (HCC) 12/18/2023    Past Surgical History:  Procedure Laterality Date   COLON SURGERY     ESOPHAGOGASTRODUODENOSCOPY N/A 04/04/2023   Procedure: ESOPHAGOGASTRODUODENOSCOPY (EGD);  Surgeon: Rosalie Kitchens, MD;  Location: [facility];  Service: Gastroenterology;  Laterality: N/A;   TOTAL HIP ARTHROPLASTY Left 08/03/2015   Procedure: TOTAL LEFT HIP ARTHROPLASTY ANTERIOR APPROACH;  Surgeon: Redell Shoals, MD;  Location: [facility];  Service: Orthopedics;  Laterality: Left;   TUBAL LIGATION      Social History:  reports that she has never smoked. She has never used smokeless tobacco. She reports that she does not drink alcohol  and does not use drugs.   Allergies[1]  Family History  Problem Relation Age of Onset   Cataracts Sister    Hypertension Sister    Hypertension Brother       Prior to Admission medications  Medication Sig Start Date End Date Taking? Authorizing Provider  acetaminophen  (TYLENOL ) 500 MG tablet Take 2 tablets (1,000 mg total) by mouth every 8 (eight) hours. 12/29/23   Fausto Burnard LABOR, DO  albuterol  (VENTOLIN  HFA) 108 (90 Base) MCG/ACT inhaler Inhale 2 puffs into the lungs every 6 (six) hours as needed for wheezing or shortness of breath.    [provider]  diltiazem  (CARDIZEM  CD) 180 MG 24 hr capsule Take 1 capsule (180 mg total) by mouth daily. 12/29/23   Fausto Burnard A, DO  feeding supplement (ENSURE PLUS HIGH PROTEIN) LIQD Take 237 mLs by mouth 2 (two) times daily between meals. 12/29/23   Fausto Burnard A, DO  iron  polysaccharides (NU-IRON ) 150 MG capsule Take 1 capsule (150 mg total) by mouth daily. 04/09/23 10/26/23  Laurence Locus, DO  lidocaine  (LIDODERM ) 5 %  Place 1 patch onto the skin daily. Remove & Discard patch within 12 hours or as directed by MD 10/28/23   [place], Camellia PARAS, DO  loperamide  (IMODIUM ) 2 MG capsule Take 1 capsule (2 mg total) by mouth daily as needed for diarrhea or loose stools. 01/31/23   Jolaine Pac, DO  magnesium  oxide (MAG-OX) 400 (240 Mg) MG tablet Take 1  tablet (400 mg total) by mouth daily. 12/29/23   Fausto Sor A, DO  melatonin 5 MG TABS Take 1 tablet (5 mg total) by mouth at bedtime. 01/31/23   Jolaine Pac, DO  metoprolol  succinate (TOPROL -XL) 50 MG 24 hr tablet Take 1 tablet (50 mg total) by mouth daily. Take with or immediately following a meal. 12/29/23   Fausto Sor A, DO  MYRBETRIQ  25 MG TB24 tablet Take 25 mg by mouth daily. 09/29/23   [provider]  pantoprazole  (PROTONIX ) 40 MG tablet Take 1 tablet (40 mg total) by mouth 2 (two) times daily. 04/09/23 04/08/24  Laurence Camellia, DO  potassium chloride  SA (KLOR-CON  M) 20 MEQ tablet Take 1 tablet (20 mEq total) by mouth daily. 12/29/23   Fausto Sor LABOR, DO  QUEtiapine  (SEROQUEL ) 25 MG tablet Take 1 tablet (25 mg total) by mouth daily at 8 pm. 12/29/23   Fausto Sor LABOR, DO  QUEtiapine  (SEROQUEL ) 50 MG tablet Take 1 tablet (50 mg total) by mouth at bedtime. 12/29/23   Fausto Sor LABOR, DO  sodium bicarbonate  650 MG tablet Take 1 tablet (650 mg total) by mouth 2 (two) times daily. 12/29/23   Fausto Sor LABOR, DO  traMADol  (ULTRAM ) 50 MG tablet Take 1 tablet (50 mg total) by mouth every 6 (six) hours as needed for moderate pain (pain score 4-6). 12/29/23   Fausto Sor LABOR, DO    Physical Exam: BP (!) 155/92   Pulse 86   Temp 98.3 F (36.8 C) (Oral)   Resp (!) 21   Ht 5' 2 (1.575 m)   Wt 41.4 kg   SpO2 98%   BMI 16.69 kg/m   General: 79 y.o. year-old female well developed well nourished in no acute distress.  Alert and oriented x3. Cardiovascular: Regular rate and rhythm with no rubs or gallops.  No thyromegaly or JVD noted.  2+ pitting edema in lower extremities bilaterally. Respiratory: Clear to auscultation with no wheezes or rales. Good inspiratory effort. Abdomen: Soft nontender nondistended with normal bowel sounds x4 quadrants. Muskuloskeletal: No cyanosis or clubbing noted bilaterally Neuro: CN II-XII intact, strength, sensation, reflexes Skin: No ulcerative  lesions noted or rashes Psychiatry: Judgement and insight appear normal. Mood is appropriate for condition and setting          Labs on Admission:  Basic Metabolic Panel: Recent Labs  Lab 03/03/24 1817  NA 136  K 2.4*  CL 99  CO2 14*  GLUCOSE 84  BUN 34*  CREATININE 2.42*  CALCIUM  7.0*  MG 1.2*   Liver Function Tests: Recent Labs  Lab 03/03/24 1817  AST 30  ALT 19  ALKPHOS 108  BILITOT 0.6  PROT 7.3  ALBUMIN 3.3*   No results for input(s): LIPASE, AMYLASE in the last 168 hours. No results for input(s): AMMONIA in the last 168 hours. CBC: Recent Labs  Lab 03/03/24 1817  WBC 13.5*  NEUTROABS 11.7*  HGB 15.1*  HCT 45.0  MCV 95.9  PLT 360   Cardiac Enzymes: No results for input(s): CKTOTAL, CKMB, CKMBINDEX, TROPONINI in the last 168 hours.  BNP (last 3  results) No results for input(s): BNP in the last 8760 hours.  ProBNP (last 3 results) Recent Labs    03/03/24 1817  PROBNP 3,427.0*    CBG: No results for input(s): GLUCAP in the last 168 hours.  Radiological Exams on Admission: DG Chest Portable 1 View Result Date: 03/03/2024 EXAM: 1 VIEW(S) XRAY OF THE CHEST 03/03/2024 08:13:00 PM COMPARISON: 12/18/2023 CLINICAL HISTORY: sepsis FINDINGS: LUNGS AND PLEURA: No focal pulmonary opacity. No pleural effusion. No pneumothorax. HEART AND MEDIASTINUM: Aortic arch calcifications. BONES AND SOFT TISSUES: Large hiatal hernia. No acute osseous abnormality. IMPRESSION: 1. No acute cardiopulmonary abnormality. Electronically signed by: Pinkie Pebbles MD MD 03/03/2024 08:22 PM EST RP Workstation: [id number]   CT Head Wo Contrast Result Date: 03/03/2024 CLINICAL DATA:  Altered mental status EXAM: CT HEAD WITHOUT CONTRAST TECHNIQUE: Contiguous axial images were obtained from the base of the skull through the vertex without intravenous contrast. RADIATION DOSE REDUCTION: This exam was performed according to the departmental dose-optimization program which  includes automated exposure control, adjustment of the mA and/or kV according to patient size and/or use of iterative reconstruction technique. COMPARISON:  Head CT 12/18/2023, MRI 01/24/2023 FINDINGS: Brain: Limited by scan technique and patient positioning. Allowing for this, no gross territorial infarct, hemorrhage or intracranial mass is seen. Chronic infarct in the right basal ganglia. Chronic right occipital infarct. Chronic small vessel ischemic changes of the white matter. No ventricular enlargement is seen Vascular: No unexpected calcification Skull: No obvious fracture Sinuses/Orbits: No acute finding Other: None IMPRESSION: 1. Limited exam due to scan technique and patient positioning. Allowing for this, no gross acute intracranial abnormality is seen. 2. Chronic infarcts in the right basal ganglia and right occipital lobe. Chronic small vessel ischemic changes of the white matter. Electronically Signed   By: Luke Bun M.D.   On: 03/03/2024 17:48    EKG: I independently viewed the EKG done and my findings are as followed: Wandering atrial pacemaker rate of 89.  QTc 456.  Assessment/Plan Present on Admission:  Failure to thrive in adult  Principal Problem:   Failure to thrive in adult  Failure to thrive in adult BMI 16 Severe protein mass loss. Albumin 3.3 Liberalize diet Nutritionist consult  Bilateral lower extremity edema Elevated proBNP 3427 Follow transthoracic echocardiogram. Monitor strict I's and O's and daily weight  AKI, prerenal in the setting of poor oral intake Presented with creatinine of 2.42 and BUN of 24 and GFR of 20 Baseline creatinine 0.8 with GFR greater than 60 Monitor urine output Repeat BMP in the morning  High anion gap metabolic acidosis secondary to acute renal insufficiency Serum bicarb 14, anion gap 22 Continue to treat underlying conditions Repeat BMP in the morning.  Elevated troponin, suspect demand ischemia No anginal symptoms  reported No evidence of acute ischemia on twelve-lead EKG Presented with troponin 113, repeat 111. Continue to monitor on telemetry.  Generalized weakness PT OT evaluation Fall precautions  History of CVA Resume home regimen  Hypokalemia Serum potassium 2.4 Repleted orally and intravenously.  Hypomagnesemia Serum magnesium  1.2 Repleted intravenously. Repeat magnesium  level in the morning.   Time: 75 minutes.   DVT prophylaxis: Subcu Lovenox  daily  Code Status: DNR/DNI.  Family Communication: None at bedside.  Disposition Plan: Admitted to telemetry unit.  Consults called: None.  Admission status: Inpatient status.   Status is: Inpatient The patient requires at least 2 midnights for further evaluation and treatment of present condition.   Terry LOISE Hurst MD [facility] Pager [phone]  If 7PM-7AM, please contact night-coverage [email or web address] Password [email or web address]  03/04/2024, 12:10 AM      [1]  Allergies Allergen Reactions   Pine Shortness Of Breath   Pollen Extract Shortness Of Breath   Caduet [Amlodipine -Atorvastatin] Other (See Comments)    Unknown reaction   "

## 2024-03-04 NOTE — Progress Notes (Signed)
 PT Cancellation Note  Patient Details Name: Monica Conway MRN: [id number] DOB: 1945/08/04   Cancelled Treatment:    Reason Eval/Treat Not Completed: Medical issues which prohibited therapy (Pt with potassium at low critical value. Per MD, pt will have labs redrawn this am. Will follow up and re-attempt as medically appropriate.)  Lillith Mcneff W, PT, DPT Secure Chat Preferred  Rehab Office [phone]  Kate BRAVO Wendolyn 03/04/2024, 7:49 AM

## 2024-03-04 NOTE — Progress Notes (Signed)
 OT Cancellation Note  Patient Details Name: Zakaiya Lares MRN: [id number] DOB: [birth year]   Cancelled Treatment:    Reason Eval/Treat Not Completed: Patient not medically ready (MD asked to hold due to K+ (needs to be above 2.5 for therapy to work with her)) OT will continue to follow acutely for evaluation  Leita JINNY Odea 03/04/2024, 12:18 PM  Leita DEL OTR/L Acute Rehabilitation Services Office: [phone]

## 2024-03-05 LAB — COMPREHENSIVE METABOLIC PANEL WITH GFR
ALT: 18 U/L (ref 0–44)
AST: 44 U/L — ABNORMAL HIGH (ref 15–41)
Albumin: 2.5 g/dL — ABNORMAL LOW (ref 3.5–5.0)
Alkaline Phosphatase: 93 U/L (ref 38–126)
Anion gap: 13 (ref 5–15)
BUN: 27 mg/dL — ABNORMAL HIGH (ref 8–23)
CO2: 14 mmol/L — ABNORMAL LOW (ref 22–32)
Calcium: 8.2 mg/dL — ABNORMAL LOW (ref 8.9–10.3)
Chloride: 105 mmol/L (ref 98–111)
Creatinine, Ser: 1.47 mg/dL — ABNORMAL HIGH (ref 0.44–1.00)
GFR, Estimated: 36 mL/min — ABNORMAL LOW
Glucose, Bld: 116 mg/dL — ABNORMAL HIGH (ref 70–99)
Potassium: 3.7 mmol/L (ref 3.5–5.1)
Sodium: 132 mmol/L — ABNORMAL LOW (ref 135–145)
Total Bilirubin: 0.4 mg/dL (ref 0.0–1.2)
Total Protein: 6 g/dL — ABNORMAL LOW (ref 6.5–8.1)

## 2024-03-05 LAB — CBC
HCT: 42.2 % (ref 36.0–46.0)
Hemoglobin: 14.3 g/dL (ref 12.0–15.0)
MCH: 31.5 pg (ref 26.0–34.0)
MCHC: 33.9 g/dL (ref 30.0–36.0)
MCV: 93 fL (ref 80.0–100.0)
Platelets: 349 K/uL (ref 150–400)
RBC: 4.54 MIL/uL (ref 3.87–5.11)
RDW: 19.3 % — ABNORMAL HIGH (ref 11.5–15.5)
WBC: 11 K/uL — ABNORMAL HIGH (ref 4.0–10.5)
nRBC: 0 % (ref 0.0–0.2)

## 2024-03-05 MED ORDER — B COMPLEX-C PO TABS
1.0000 | ORAL_TABLET | Freq: Every day | ORAL | Status: DC
  Administered 2024-03-06 – 2024-03-15 (×8): 1 via ORAL
  Filled 2024-03-05 (×10): qty 1

## 2024-03-05 MED ORDER — THIAMINE MONONITRATE 100 MG PO TABS
100.0000 mg | ORAL_TABLET | Freq: Every day | ORAL | Status: AC
  Administered 2024-03-06 – 2024-03-12 (×6): 100 mg via ORAL
  Filled 2024-03-05 (×7): qty 1

## 2024-03-05 MED ORDER — ENSURE PLUS HIGH PROTEIN PO LIQD
237.0000 mL | Freq: Two times a day (BID) | ORAL | Status: DC
  Administered 2024-03-09 – 2024-03-13 (×8): 237 mL via ORAL

## 2024-03-05 MED ORDER — ADULT MULTIVITAMIN W/MINERALS CH
1.0000 | ORAL_TABLET | Freq: Every day | ORAL | Status: DC
  Administered 2024-03-06 – 2024-03-15 (×8): 1 via ORAL
  Filled 2024-03-05 (×11): qty 1

## 2024-03-05 NOTE — Evaluation (Signed)
 Physical Therapy Evaluation Patient Details Name: Monica Conway MRN: [id number] DOB: 06-21-1945 Today's Date: 03/05/2024  History of Present Illness  79 y.o. female presents to [facility] 03/04/23 with failure to thrive, generalized weakness, BLE edema, hypocalcemia and hypokalemia. PMHx: paroxysmal A-fib not on anticoagulation due to history of GI bleed, hypertension, history of CVA, Barrett's esophagus, osteoporosis, bilateral knee osteoarthritis, severe protein calorie malnutrition   Clinical Impression  Pt apparently recently admitted to SNF following most recent admission, but is not willing to answer follow up questions about it. Per pt report, she has a HH aide who assists her with all mobility and ADLs, 7 days per week, 5 hours per day. Again, pt not willing to answer follow up questions about home setup or prior level of function. Pt presents to evaluation with deficits in mobility, strength, balance, activity tolerance, pain, and power. Pt appears to have bilateral knee flexion contractures, lacking the ability to achieve last 30-40 degrees of extension. Pt was total A +2 for bed mobility and required maximal physical assistance of 2 for transfers via HHA on either side of patient. To DC home, pt would need 24/7 assistance for all mobility and ADL needs. Patient will benefit from continued inpatient follow up therapy, <3 hours/day.        If plan is discharge home, recommend the following: Two people to help with walking and/or transfers;Two people to help with bathing/dressing/bathroom;Assistance with cooking/housework;Direct supervision/assist for medications management;Direct supervision/assist for financial management;Assist for transportation   Can travel by private vehicle   No    Equipment Recommendations Hospital bed;Hoyer lift  Recommendations for Other Services       Functional Status Assessment Patient has had a recent decline in their functional status and demonstrates the  ability to make significant improvements in function in a reasonable and predictable amount of time.     Precautions / Restrictions Precautions Precautions: Fall Recall of Precautions/Restrictions: Impaired Restrictions Weight Bearing Restrictions Per Provider Order: No      Mobility  Bed Mobility Overal bed mobility: Needs Assistance Bed Mobility: Supine to Sit, Sit to Supine     Supine to sit: Total assist, +2 for physical assistance Sit to supine: Total assist, +2 for physical assistance   General bed mobility comments: Pt unable to advance BLEs towards EOB, requiring physical assitance for both trunk and BLE management via bed pad. Increased time to complete.    Transfers Overall transfer level: Needs assistance Equipment used: 2 person hand held assist Transfers: Sit to/from Stand, Bed to chair/wheelchair/BSC Sit to Stand: Max assist, +2 physical assistance Stand pivot transfers: Max assist, +2 physical assistance         General transfer comment: Pt completed STS from EOB with 2 HHA on either side of pt with maximal physical assistance of 2 for initial power-up and then maximal physical assistance of 2 for stand pivot transfer from EOB to [facility] on the R. Increased time to complete.    Ambulation/Gait                  Stairs            Wheelchair Mobility     Tilt Bed    Modified Rankin (Stroke Patients Only)       Balance Overall balance assessment: Needs assistance Sitting-balance support: Bilateral upper extremity supported, Feet supported Sitting balance-Leahy Scale: Poor Sitting balance - Comments: requires BUE support for stability Postural control: Posterior lean Standing balance support: Bilateral upper extremity supported, During functional  activity, Reliant on assistive device for balance Standing balance-Leahy Scale: Zero Standing balance comment: reliant on external support for maintaining upright                              Pertinent Vitals/Pain Pain Assessment Pain Assessment: Faces Faces Pain Scale: Hurts whole lot Pain Location: BLE Pain Descriptors / Indicators: Discomfort, Grimacing, Guarding, Moaning Pain Intervention(s): Monitored during session, Limited activity within patient's tolerance    Home Living Family/patient expects to be discharged to:: Private residence Living Arrangements: Alone Available Help at Discharge: Other (Comment) (pt has caretakers 7 days per week, 5 hours per day) Type of Home: House Home Access: Ramped entrance       Home Layout: One level Home Equipment: Agricultural Consultant (2 wheels);Wheelchair - manual;Shower seat;Grab bars - tub/shower;Hand held shower head;BSC/3in1 Additional Comments: above info per recent admission 1 month    Prior Function Prior Level of Function : Patient poor historian/Family not available       Physical Assist : Mobility (physical);ADLs (physical) Mobility (physical): Bed mobility;Transfers;Gait ADLs (physical): Grooming;Bathing;Dressing;Toileting Mobility Comments: Pt rpeorts he uses both walker and WC for mobilty. ADLs Comments: Pt inconsistent in her report, reporting caregiver helps her with all ADLs and also that she doesn't need assistance for ADLs.     Extremity/Trunk Assessment   Upper Extremity Assessment Upper Extremity Assessment: Defer to OT evaluation    Lower Extremity Assessment Lower Extremity Assessment: Generalized weakness    Cervical / Trunk Assessment Cervical / Trunk Assessment:  (bilateral knee flexion contractures limiting ability to extend in stance. pt rests in PF bilaterally but is able to obtain foot flat)  Communication   Communication Communication: Impaired Factors Affecting Communication: Hearing impaired    Cognition Arousal: Alert Behavior During Therapy: Restless, Agitated, Anxious   PT - Cognitive impairments: No family/caregiver present to determine baseline, Memory, Attention,  Initiation, Sequencing, Problem solving, Safety/Judgement                       PT - Cognition Comments: Pt notably agitated throughout session, asking therapists to quit asking questions and to stop providing physical assistance despite needing physical assistance for all mobility tasks. Pt multiple times asking therapists to close window reporting there's a draft in the room but therapist told pt window was closed with pt adamant window was open. Following commands: Impaired Following commands impaired: Follows one step commands with increased time     Cueing Cueing Techniques: Verbal cues, Tactile cues, Visual cues     General Comments General comments (skin integrity, edema, etc.): BP: 107/76, SpO2 98% on RA, HR: 75 bpm    Exercises     Assessment/Plan    PT Assessment Patient needs continued PT services  PT Problem List Decreased strength;Decreased range of motion;Decreased activity tolerance;Decreased balance;Decreased mobility;Decreased knowledge of use of DME;Decreased safety awareness;Pain       PT Treatment Interventions DME instruction;Gait training;Functional mobility training;Therapeutic activities;Therapeutic exercise;Balance training;Patient/family education;Wheelchair mobility training;Manual techniques;Modalities    PT Goals (Current goals can be found in the Care Plan section)  Acute Rehab PT Goals Patient Stated Goal: to not go to rehab PT Goal Formulation: With patient Time For Goal Achievement: 03/19/24 Potential to Achieve Goals: Poor    Frequency Min 2X/week     Co-evaluation PT/OT/SLP Co-Evaluation/Treatment: Yes Reason for Co-Treatment: Necessary to address cognition/behavior during functional activity;For patient/therapist safety;To address functional/ADL transfers PT goals addressed during session: Mobility/safety with  mobility;Balance;Proper use of DME;Strengthening/ROM OT goals addressed during session: ADL's and self-care        AM-PAC PT 6 Clicks Mobility  Outcome Measure Help needed turning from your back to your side while in a flat bed without using bedrails?: A Lot Help needed moving from lying on your back to sitting on the side of a flat bed without using bedrails?: Total Help needed moving to and from a bed to a chair (including a wheelchair)?: Total Help needed standing up from a chair using your arms (e.g., wheelchair or bedside chair)?: Total Help needed to walk in hospital room?: Total Help needed climbing 3-5 steps with a railing? : Total 6 Click Score: 7    End of Session Equipment Utilized During Treatment: Gait belt Activity Tolerance: Treatment limited secondary to agitation Patient left: in bed;with call bell/phone within reach;with bed alarm set Nurse Communication: Mobility status;Other (comment) (pt left on bed pan; NT and RN notified) PT Visit Diagnosis: Unsteadiness on feet (R26.81);Other abnormalities of gait and mobility (R26.89);Muscle weakness (generalized) (M62.81);Adult, failure to thrive (R62.7);Pain Pain - part of body: Leg    Time: 1131-1205 PT Time Calculation (min) (ACUTE ONLY): 34 min   Charges:   PT Evaluation $PT Eval Moderate Complexity: 1 Mod   PT General Charges $$ ACUTE PT VISIT: 1 Visit         Leontine Hilt DPT Acute Rehab Services [phone] Prefer contact via chat   Leontine NOVAK Dare Sanger 03/05/2024, 1:37 PM

## 2024-03-05 NOTE — Plan of Care (Signed)
   Problem: Clinical Measurements: Goal: Ability to maintain clinical measurements within normal limits will improve Outcome: Progressing

## 2024-03-05 NOTE — Progress Notes (Signed)
 Pt is currently eating late breakfast, Report Given to [facility] Nurse.

## 2024-03-05 NOTE — Progress Notes (Addendum)
 [facility]  PROGRESS NOTE  Monica Conway FMW:[id number] DOB: February 15, 1946 DOA: 03/03/2024 PCP: [facility]   Brief HPI:   79 y.o. female with medical history significant for paroxysmal A-fib not on anticoagulation due to history of GI bleed, hypertension, history of CVA, Barrett's esophagus, osteoporosis, bilateral knee osteoarthritis, severe protein calorie malnutrition, presents to the ER due to failure to thrive in an adult.  Associated with generalized weakness and poor oral intake.  Also endorses worsening legs swelling for the past several weeks.  Denies chest pain, shortness of breath, or palpitations.      Assessment/Plan:   Failure to thrive in adult BMI 16 Severe protein mass loss. Albumin 3.3 Liberalize diet Nutritionist consult   Bilateral lower extremity edema -Edema has improved, albumin 2.5 -Elevated proBNP 3427 -Echocardiogram obtained showed EF of 55 to 60%, showed low normal right ventricular function -Not on diuretics  AKI, prerenal in the setting of poor oral intake Presented with creatinine of 2.42 and BUN of 24 and GFR of 20 Baseline creatinine 0.8 with GFR greater than 60 -Creatinine has improved to 1.47   High anion gap metabolic acidosis secondary to acute renal insufficiency -Resolved Serum bicarb 14, anion gap 13 Continue to treat underlying conditions Repeat BMP in the morning.   Elevated troponin, suspect demand ischemia No anginal symptoms reported No evidence of acute ischemia on twelve-lead EKG Presented with troponin 113, repeat 111. Continue to monitor on telemetry.   Generalized weakness PT OT evaluation Fall precautions   History of CVA Resume home regimen   Hypokalemia Replete  Hypocalcemia - Resolved  corrected serum calcium  9.4   Hypomagnesemia Replete        DVT prophylaxis:   Medications      Data Reviewed:   CBG:  No results for input(s): GLUCAP in the last 168 hours.  SpO2: 97  %    Vitals:   03/04/24 2217 03/05/24 0447 03/05/24 0458 03/05/24 0830  BP: (!) 153/84 117/77  107/76  Pulse: 81 77  77  Resp: 17 19  18   Temp: 98.2 F (36.8 C) 98.2 F (36.8 C)  (!) 97.5 F (36.4 C)  TempSrc: Oral Oral  Oral  SpO2: 96% 98%  97%  Weight: 40.3 kg  (P) 39.2 kg   Height: 5' 2 (1.575 m)         Data Reviewed:  Basic Metabolic Panel: Recent Labs  Lab 03/03/24 1817 03/04/24 0746 03/05/24 0227  NA 136 140 132*  K 2.4* 2.4* 3.7  CL 99 114* 105  CO2 14* 10* 14*  GLUCOSE 84 60* 116*  BUN 34* 25* 27*  CREATININE 2.42* 1.45* 1.47*  CALCIUM  7.0* 5.3* 8.2*  MG 1.2* 3.2*  --     CBC: Recent Labs  Lab 03/03/24 1817 03/05/24 0227  WBC 13.5* 11.0*  NEUTROABS 11.7*  --   HGB 15.1* 14.3  HCT 45.0 42.2  MCV 95.9 93.0  PLT 360 349    LFT Recent Labs  Lab 03/03/24 1817 03/04/24 0746 03/05/24 0227  AST 30 27 44*  ALT 19 12 18   ALKPHOS 108 68 93  BILITOT 0.6 0.4 0.4  PROT 7.3 4.6* 6.0*  ALBUMIN 3.3* 2.1* 2.5*     Antibiotics: Anti-infectives (From admission, onward)    None        CONSULTS   Code Status: DNR  Family Communication: No family at bedside     Subjective   Denies any complaints.  Feels better today   Objective  Physical Examination:   General-appears in no acute distress Heart-S1-S2, regular, no murmur auscultated Lungs-clear to auscultation bilaterally, no wheezing or crackles auscultated Abdomen-soft, nontender, no organomegaly Extremities-bilateral 1+ edema in the lower extremities Neuro-alert, oriented x3, no focal deficit noted           Ernesto Zukowski S Kahlani Graber   [facility] If 7PM-7AM, please contact night-coverage at [email or web address], Office  [phone]   03/05/2024, 10:00 AM  LOS: 2 days

## 2024-03-05 NOTE — Progress Notes (Signed)
 PT Cancellation Note  Patient Details Name: Monica Conway MRN: [id number] DOB: 1945/06/18   Cancelled Treatment:    Reason Eval/Treat Not Completed: Other (comment) (Pt refused stating, you can't ask me quesitons this early. Therapist provided education on the importance of early mobilization. Plan to follow up when able.)   Leontine Hilt DPT Acute Rehab Services [phone] Prefer contact via chat   [name] 03/05/2024, 9:13 AM

## 2024-03-05 NOTE — Evaluation (Signed)
 Occupational Therapy Evaluation Patient Details Name: Monica Conway MRN: [id number] DOB: 1945/09/10 Today's Date: 03/05/2024   History of Present Illness   79 y.o. female [facility] to [facility]04[facility]23 with failure to thrive, generalized weakness, BLE edema, hypocalcemia and hypokalemia. PMHx: paroxysmal A-fib not on anticoagulation due to history of GI bleed, hypertension, history of CVA, Barrett's esophagus, osteoporosis, bilateral knee osteoarthritis, severe protein calorie malnutrition     Clinical Impressions Pt apparently recently went to SNF after last admission 10/2023.  [name] states she has caregivers 7 days/wk 5 hours/day - unsure how long she had been home or exactly how she has been functioning as [name] did not want to answer all of our questions. During session, Monica Conway required total A with bed mobility, Max A +2 with stand pivot transfers and Max to total A with ADL tasks due to generalized weakness and apparent B knee flexion contractures. To DC home, Monica Conway needs assistance with all mobility and ADL tasks. At this time, feel Patient will benefit from continued inpatient follow up therapy, <3 hours/day. Acute OT to follow.      If plan is discharge home, recommend the following:   Two people to help with walking and/or transfers;Two people to help with bathing/dressing/bathroom;Assist for transportation;Direct supervision/assist for financial management;Direct supervision/assist for medications management;Assistance with cooking/housework     Functional Status Assessment   Patient has had a recent decline in their functional status and demonstrates the ability to make significant improvements in function in a reasonable and predictable amount of time.     Equipment Recommendations   Hospital bed;Hoyer lift     Recommendations for Other Services         Precautions/Restrictions   Precautions Precautions: Fall Recall of Precautions/Restrictions: Impaired      Mobility Bed Mobility Overal bed mobility: Needs Assistance Bed Mobility: Supine to Sit, Sit to Supine     Supine to sit: Total assist, +2 for physical assistance Sit to supine: Total assist, +2 for physical assistance   General bed mobility comments: bed pad used. Pt unable to walk legs off the bed  Appears fearful of falling  Transfers Overall transfer level: Needs assistance Equipment used: 2 person hand held assist Transfers: Sit to/from Stand, Bed to chair/wheelchair/BSC Sit to Stand: Max assist, +2 physical assistance Stand pivot transfers: Max assist, +2 physical assistance                Balance Overall balance assessment: Needs assistance   Sitting balance-Leahy Scale: Poor       Standing balance-Leahy Scale: Zero                             ADL either performed or assessed with clinical judgement   ADL Overall ADL's : Needs assistance/impaired Eating/Feeding: Set up   Grooming: Set up;Bed level   Upper Body Bathing: Minimal assistance;Bed level   Lower Body Bathing: Maximal assistance;Bed level   Upper Body Dressing : Moderate assistance;Sitting   Lower Body Dressing: Maximal assistance;Bed level   Toilet Transfer: Maximal assistance;+2 for safety/equipment;BSC/3in1   Toileting- Clothing Manipulation and Hygiene: Total assistance       Functional mobility during ADLs: Maximal assistance;+2 for safety/equipment       Vision Baseline Vision/History: 1 Wears glasses       Perception         Praxis         Pertinent Vitals/Pain Pain Assessment Pain Assessment: Faces Faces  Pain Scale: Hurts whole lot Pain Location: BLE Pain Descriptors / Indicators: Discomfort, Grimacing, Guarding, Moaning Pain Intervention(s): Limited activity within patient's tolerance     Extremity/Trunk Assessment Upper Extremity Assessment Upper Extremity Assessment: Generalized weakness   Lower Extremity Assessment Lower Extremity  Assessment: Defer to PT evaluation (appears to have B knee flexion contractures, lacking @ 30 degrees)   Cervical / Trunk Assessment Cervical / Trunk Assessment: Kyphotic;Other exceptions (hx of back pain)   Communication Communication Communication: Impaired Factors Affecting Communication: Hearing impaired   Cognition Arousal: Alert Behavior During Therapy: Restless, Agitated, Anxious Cognition: No family/caregiver present to determine baseline, Cognition impaired   Orientation impairments:  (Did not ask questions due to agitation with questions) Awareness: Online awareness impaired Memory impairment (select all impairments): Short-term memory, Working civil service fast streamer, Engineer, structural memory Attention impairment (select first level of impairment): Sustained attention Executive functioning impairment (select all impairments): Initiation, Organization, Reasoning, Problem solving                   Following commands: Impaired Following commands impaired: Follows one step commands with increased time     Cueing  General Comments   Cueing Techniques: Verbal cues;Tactile cues;Visual cues  BP: 107/76, SpO2 98% on RA, HR: 75 bpm   Exercises     Shoulder Instructions      Home Living Family/patient expects to be discharged to:: Private residence Living Arrangements: Alone Available Help at Discharge: Other (Comment) (pt has caretakers 7 days per week, 5 hours per day) Type of Home: House Home Access: Ramped entrance     Home Layout: One level     Bathroom Shower/Tub: Chief Strategy Officer: Handicapped height Bathroom Accessibility: Yes How Accessible: Accessible via walker Home Equipment: Rolling Walker (2 wheels);Wheelchair - manual;Shower seat;Grab bars - tub/shower;Hand held shower head;BSC/3in1   Additional Comments: above info per recent admission 1 month      Prior Functioning/Environment Prior Level of Function : Patient poor historian/Family  not available;Needs assist       Physical Assist : Mobility (physical);ADLs (physical) Mobility (physical):  (unsure)   Mobility Comments: Pt rpeorts she uses both walker and WC for mobilty. ADLs Comments: Pt reports caregiver assists her with sponge baths    OT Problem List: Decreased strength;Decreased range of motion;Decreased activity tolerance;Impaired balance (sitting and/or standing);Decreased cognition;Decreased safety awareness;Decreased knowledge of use of DME or AE;Decreased knowledge of precautions;Cardiopulmonary status limiting activity;Pain   OT Treatment/Interventions: Self-care/ADL training;Therapeutic exercise;Energy conservation;DME and/or AE instruction;Therapeutic activities;Cognitive remediation/compensation;Patient/family education;Balance training      OT Goals(Current goals can be found in the care plan section)   Acute Rehab OT Goals Patient Stated Goal: home OT Goal Formulation: Patient unable to participate in goal setting Time For Goal Achievement: 03/19/24 Potential to Achieve Goals: Fair   OT Frequency:  Min 2X/week    Co-evaluation PT/OT/SLP Co-Evaluation/Treatment: Yes Reason for Co-Treatment: Necessary to address cognition/behavior during functional activity;For patient/therapist safety;To address functional/ADL transfers   OT goals addressed during session: ADL's and self-care      AM-PAC OT 6 Clicks Daily Activity     Outcome Measure Help from another person eating meals?: A Little Help from another person taking care of personal grooming?: A Little Help from another person toileting, which includes using toliet, bedpan, or urinal?: Total Help from another person bathing (including washing, rinsing, drying)?: A Lot Help from another person to put on and taking off regular upper body clothing?: A Lot Help from another person to put on and taking  off regular lower body clothing?: A Lot 6 Click Score: 13   End of Session Equipment  Utilized During Treatment: Gait belt Nurse Communication: Mobility status;Other (comment) (pt on bed pan; pt had black tarry stool)  Activity Tolerance: Patient tolerated treatment well Patient left: in bed;with call bell/phone within reach;with bed alarm set  OT Visit Diagnosis: Unsteadiness on feet (R26.81);Other abnormalities of gait and mobility (R26.89);Muscle weakness (generalized) (M62.81);History of falling (Z91.81);Other symptoms and signs involving cognitive function;Pain;Adult, failure to thrive (R62.7) Pain - part of body: Leg;Knee;Hip (B)                Time: 8870-8797 OT Time Calculation (min): 33 min Charges:  OT General Charges $OT Visit: 1 Visit OT Evaluation $OT Eval Moderate Complexity: 1 Mod  Avyn Coate, OT/L   Acute OT Clinical Specialist Acute Rehabilitation Services Pager [phone] Office [phone]   [facility] 03/05/2024, 12:39 PM

## 2024-03-05 NOTE — Progress Notes (Signed)
 Initial Nutrition Assessment  DOCUMENTATION CODES:   Not applicable  INTERVENTION  Recommend G-tube placement and nutrition support if goal is for aggressive care   Ensure Plus High Protein po BID, each supplement provides 350 kcal and 20 grams of protein  Magic cup TID with meals, each supplement provides 290 kcal and 9 grams of protein  MVI po daily   B complex with C po daily   Thiamine  100mg  po daily x 7 days   Pt at high refeed risk; recommend monitor potassium, magnesium  and phosphorus labs daily until stable  Daily weights   NUTRITION DIAGNOSIS:   Unintentional weight loss related to acute illness as evidenced by 28 percent weight loss in <5 months.  GOAL:   Patient will meet greater than or equal to 90% of their needs  MONITOR:   PO intake, Supplement acceptance, Labs, Weight trends, Skin, I & O's  REASON FOR ASSESSMENT:   Consult Assessment of nutrition requirement/status  ASSESSMENT:   79 y/o female with h/o CHF, HTN, PAF, stroke, PUD, proctocolitis, asthma, Barrett's esophagus, hiatal hernia, erosive gastritits, compression fracture and dementia who is admitted with FTT and AKI.  RD working remotely.  Per chart review, pt with poor appetite and oral intake pta. Pt has lost 34lbs(28%) over the past 5 months; this is severe weight loss. Pt has had numerous ED admissions over the past 5 months and is now admitted with FTT. Pt with poor appetite and oral intake in hospital. RD will add supplements and vitamins to help pt meet his estimated needs. Pt is at high refeed risk. Would recommend G-tube placement and nutrition support if plan is for full aggressive care; this was dicussed with MD. Palliative care consult is pending. Pt likely with severe malnutrition but unable to diagnose at this time. RD will obtain history and exam at follow up.   Medications reviewed and include: melatonin   Labs reviewed: Na 132(L), K 3.7 wnl, BUN 27(H), creat 1.47(H) Wbc-  11.0(H)  NUTRITION - FOCUSED PHYSICAL EXAM: Unable to perform at this time   Diet Order:   Diet Order             Diet regular Room service appropriate? Yes; Fluid consistency: Thin  Diet effective now                  EDUCATION NEEDS:   Not appropriate for education at this time  Skin:  Skin Assessment: Reviewed RN Assessment (ecchymosis)  Last BM:  1/10- type 7  Height:   Ht Readings from Last 1 Encounters:  03/04/24 5' 2 (1.575 m)    Weight:   Wt Readings from Last 1 Encounters:  03/05/24 (P) 39.2 kg    Ideal Body Weight:  50 kg  BMI:  Body mass index is 15.81 kg/m (pended).  Estimated Nutritional Needs:   Kcal:  1200-1400kcal/day  Protein:  60-70g/day  Fluid:  1.0-1.2L/day  Augustin Shams MS, RD, LDN If unable to be reached, please send secure chat to RD inpatient available from 8:00a-4:00p daily

## 2024-03-06 DIAGNOSIS — R627 Adult failure to thrive: Secondary | ICD-10-CM | POA: Diagnosis not present

## 2024-03-06 LAB — COMPREHENSIVE METABOLIC PANEL WITH GFR
ALT: 14 U/L (ref 0–44)
AST: 20 U/L (ref 15–41)
Albumin: 2.1 g/dL — ABNORMAL LOW (ref 3.5–5.0)
Alkaline Phosphatase: 72 U/L (ref 38–126)
Anion gap: 10 (ref 5–15)
BUN: 25 mg/dL — ABNORMAL HIGH (ref 8–23)
CO2: 16 mmol/L — ABNORMAL LOW (ref 22–32)
Calcium: 7.9 mg/dL — ABNORMAL LOW (ref 8.9–10.3)
Chloride: 109 mmol/L (ref 98–111)
Creatinine, Ser: 1.14 mg/dL — ABNORMAL HIGH (ref 0.44–1.00)
GFR, Estimated: 49 mL/min — ABNORMAL LOW
Glucose, Bld: 88 mg/dL (ref 70–99)
Potassium: 3.2 mmol/L — ABNORMAL LOW (ref 3.5–5.1)
Sodium: 135 mmol/L (ref 135–145)
Total Bilirubin: 0.4 mg/dL (ref 0.0–1.2)
Total Protein: 5 g/dL — ABNORMAL LOW (ref 6.5–8.1)

## 2024-03-06 LAB — HEPARIN LEVEL (UNFRACTIONATED)
Heparin Unfractionated: 0.36 [IU]/mL (ref 0.30–0.70)
Heparin Unfractionated: 0.16 [IU]/mL — ABNORMAL LOW (ref 0.30–0.70)

## 2024-03-06 LAB — CBC
HCT: 38.1 % (ref 36.0–46.0)
Hemoglobin: 13.1 g/dL (ref 12.0–15.0)
MCH: 32.1 pg (ref 26.0–34.0)
MCHC: 34.4 g/dL (ref 30.0–36.0)
MCV: 93.4 fL (ref 80.0–100.0)
Platelets: 359 K/uL (ref 150–400)
RBC: 4.08 MIL/uL (ref 3.87–5.11)
RDW: 19.1 % — ABNORMAL HIGH (ref 11.5–15.5)
WBC: 7.8 K/uL (ref 4.0–10.5)
nRBC: 0 % (ref 0.0–0.2)

## 2024-03-06 LAB — URINE CULTURE: Culture: >=100000 — AB

## 2024-03-06 LAB — MAGNESIUM: Magnesium: 1.9 mg/dL (ref 1.7–2.4)

## 2024-03-06 LAB — PHOSPHORUS: Phosphorus: 2.9 mg/dL (ref 2.5–4.6)

## 2024-03-06 MED ORDER — MAGNESIUM OXIDE -MG SUPPLEMENT 400 (240 MG) MG PO TABS
400.0000 mg | ORAL_TABLET | Freq: Every day | ORAL | Status: DC
  Administered 2024-03-06 – 2024-03-15 (×8): 400 mg via ORAL
  Filled 2024-03-06 (×9): qty 1

## 2024-03-06 MED ORDER — HALOPERIDOL LACTATE 5 MG/ML IJ SOLN
3.0000 mg | Freq: Once | INTRAMUSCULAR | Status: AC | PRN
  Administered 2024-03-06: 3 mg via INTRAVENOUS
  Filled 2024-03-06: qty 1

## 2024-03-06 MED ORDER — OXYCODONE HCL 5 MG PO TABS
2.5000 mg | ORAL_TABLET | Freq: Two times a day (BID) | ORAL | Status: DC | PRN
  Administered 2024-03-06 – 2024-03-13 (×3): 5 mg via ORAL
  Filled 2024-03-06 (×4): qty 1

## 2024-03-06 MED ORDER — POTASSIUM CHLORIDE CRYS ER 20 MEQ PO TBCR
40.0000 meq | EXTENDED_RELEASE_TABLET | Freq: Once | ORAL | Status: AC
  Administered 2024-03-06: 40 meq via ORAL
  Filled 2024-03-06: qty 2

## 2024-03-06 MED ORDER — AMIODARONE HCL IN DEXTROSE 360-4.14 MG/200ML-% IV SOLN
60.0000 mg/h | INTRAVENOUS | Status: AC
  Administered 2024-03-06 (×2): 60 mg/h via INTRAVENOUS
  Filled 2024-03-06 (×2): qty 200

## 2024-03-06 MED ORDER — AMIODARONE HCL IN DEXTROSE 360-4.14 MG/200ML-% IV SOLN
30.0000 mg/h | INTRAVENOUS | Status: DC
  Administered 2024-03-06 (×2): 30 mg/h via INTRAVENOUS
  Filled 2024-03-06: qty 200

## 2024-03-06 MED ORDER — SODIUM BICARBONATE 650 MG PO TABS
650.0000 mg | ORAL_TABLET | Freq: Two times a day (BID) | ORAL | Status: DC
  Administered 2024-03-06 – 2024-03-15 (×16): 650 mg via ORAL
  Filled 2024-03-06 (×19): qty 1

## 2024-03-06 MED ORDER — ACETAMINOPHEN 500 MG PO TABS
1000.0000 mg | ORAL_TABLET | Freq: Four times a day (QID) | ORAL | Status: DC | PRN
  Administered 2024-03-06: 1000 mg via ORAL
  Filled 2024-03-06: qty 2

## 2024-03-06 MED ORDER — PANTOPRAZOLE SODIUM 40 MG PO TBEC
40.0000 mg | DELAYED_RELEASE_TABLET | Freq: Two times a day (BID) | ORAL | Status: DC
  Administered 2024-03-06 – 2024-03-15 (×16): 40 mg via ORAL
  Filled 2024-03-06 (×19): qty 1

## 2024-03-06 MED ORDER — DICLOFENAC SODIUM 1 % EX GEL
2.0000 g | Freq: Four times a day (QID) | CUTANEOUS | Status: DC
  Administered 2024-03-09 – 2024-03-14 (×12): 2 g via TOPICAL
  Filled 2024-03-06 (×2): qty 100

## 2024-03-06 MED ORDER — QUETIAPINE FUMARATE 25 MG PO TABS: 25.0000 mg | ORAL_TABLET | Freq: Every day | ORAL | Status: DC

## 2024-03-06 MED ORDER — DILTIAZEM HCL ER COATED BEADS 180 MG PO CP24
180.0000 mg | ORAL_CAPSULE | Freq: Every day | ORAL | Status: DC
  Administered 2024-03-06: 180 mg via ORAL
  Filled 2024-03-06 (×2): qty 1

## 2024-03-06 MED ORDER — METOPROLOL SUCCINATE ER 50 MG PO TB24
50.0000 mg | ORAL_TABLET | Freq: Every day | ORAL | Status: DC
  Administered 2024-03-07 – 2024-03-10 (×4): 50 mg via ORAL
  Filled 2024-03-06 (×4): qty 1

## 2024-03-06 MED ORDER — QUETIAPINE FUMARATE 25 MG PO TABS
25.0000 mg | ORAL_TABLET | Freq: Every day | ORAL | Status: DC
  Administered 2024-03-07 – 2024-03-15 (×9): 25 mg via ORAL
  Filled 2024-03-06 (×10): qty 1

## 2024-03-06 MED ORDER — SODIUM CHLORIDE 0.9 % IV SOLN
1.0000 g | INTRAVENOUS | Status: AC
  Administered 2024-03-06 – 2024-03-08 (×3): 1 g via INTRAVENOUS
  Filled 2024-03-06 (×4): qty 10

## 2024-03-06 MED ORDER — AMIODARONE LOAD VIA INFUSION
150.0000 mg | Freq: Once | INTRAVENOUS | Status: AC
  Administered 2024-03-06: 150 mg via INTRAVENOUS
  Filled 2024-03-06: qty 83.34

## 2024-03-06 MED ORDER — QUETIAPINE FUMARATE 25 MG PO TABS
50.0000 mg | ORAL_TABLET | Freq: Every day | ORAL | Status: DC
  Administered 2024-03-06 – 2024-03-14 (×9): 50 mg via ORAL
  Filled 2024-03-06 (×2): qty 1
  Filled 2024-03-06 (×2): qty 2
  Filled 2024-03-06 (×2): qty 1
  Filled 2024-03-06 (×2): qty 2
  Filled 2024-03-06: qty 1
  Filled 2024-03-06: qty 2

## 2024-03-06 MED ORDER — HEPARIN BOLUS VIA INFUSION
2000.0000 [IU] | Freq: Once | INTRAVENOUS | Status: AC
  Administered 2024-03-06: 2000 [IU] via INTRAVENOUS
  Filled 2024-03-06: qty 2000

## 2024-03-06 MED ORDER — HEPARIN (PORCINE) 25000 UT/250ML-% IV SOLN
800.0000 [IU]/h | INTRAVENOUS | Status: DC
  Administered 2024-03-06: 550 [IU]/h via INTRAVENOUS
  Filled 2024-03-06: qty 250

## 2024-03-06 NOTE — Progress Notes (Signed)
 [facility]  PROGRESS NOTE  Monica Conway FMW:[id number] DOB: 1945/10/09 DOA: 03/03/2024 [facility]CP: [facility]   Brief HPI:   79 y.o. female with medical history significant for paroxysmal A-fib not on anticoagulation due to history of GI bleed, hypertension, history of CVA, Barrett's esophagus, osteoporosis, bilateral knee osteoarthritis, severe protein calorie malnutrition, presents to the ER due to failure to thrive in an adult.  Associated with generalized weakness and poor oral intake.  Needs SNF placement but is refusing-palliative care consultation for goals of care      Assessment/Plan:   A-fib with RVR - Was not restarted on Cardizem  and metoprolol  upon admission and went into A-fib with RVR on 1/11 - Will restart Cardizem  - Have temporarily started on heparin  drip and amiodarone  - Is not a candidate for long-term heparin  due to GI bleeding - Will restart metoprolol  in the a.m. and hopefully DC amiodarone   Failure to thrive in adult BMI 16 Severe protein mass loss. Albumin 3.3 Liberalize diet Nutritionist consult   AKI, prerenal in the setting of poor oral intake Presented with creatinine of 2.42 and BUN of 24 and GFR of 20 Baseline creatinine 0.8 with GFR greater than 60 -Creatinine has improved to 1.47 -Daily labs   High anion gap metabolic acidosis secondary to acute renal insufficiency -Resolved -Resume home medications   Elevated troponin, suspect demand ischemia No anginal symptoms reported No evidence of acute ischemia on twelve-lead EKG Presented with troponin 113, repeat 111. Continue to monitor on telemetry.   Generalized weakness PT OT evaluation-SNF placement Fall precautions   History of CVA Resume home regimen -High risk of repeat CVA with A-fib not on blood thinner   Hypokalemia Replete  Hypocalcemia - Resolved  corrected serum calcium  9.4   Hypomagnesemia Replete   Questionable UTI - Will treat x 3 days with  Rocephin       DVT prophylaxis:   Medications     B-complex with vitamin C  1 tablet Oral Daily   diclofenac  Sodium  2 g Topical QID   diltiazem   180 mg Oral Daily   feeding supplement  237 mL Oral BID BM   magnesium  oxide  400 mg Oral Daily   [START ON 03/07/2024] metoprolol  succinate  50 mg Oral Daily   multivitamin with minerals  1 tablet Oral Daily   pantoprazole   40 mg Oral BID   QUEtiapine   25 mg Oral Q2000   QUEtiapine   50 mg Oral QHS   sodium bicarbonate   650 mg Oral BID   thiamine   100 mg Oral Daily     Data Reviewed:   CBG:  No results for input(s): GLUCAP in the last 168 hours.  SpO2: 97 %    Vitals:   03/06/24 1220 03/06/24 1234 03/06/24 1315 03/06/24 1320  BP: 100/78 100/78 103/72 101/73  Pulse:    73  Resp: (!) 21  (!) 26 17  Temp:      TempSrc:      SpO2:    97%  Weight:      Height:          Data Reviewed:  Basic Metabolic Panel: Recent Labs  Lab 03/03/24 1817 03/04/24 0746 03/05/24 0227 03/06/24 0235  NA 136 140 132* 135  K 2.4* 2.4* 3.7 3.2*  CL 99 114* 105 109  CO2 14* 10* 14* 16*  GLUCOSE 84 60* 116* 88  BUN 34* 25* 27* 25*  CREATININE 2.42* 1.45* 1.47* 1.14*  CALCIUM  7.0* 5.3* 8.2* 7.9*  MG 1.2* 3.2*  --  1.9  PHOS  --   --   --  2.9    CBC: Recent Labs  Lab 03/03/24 1817 03/05/24 0227 03/06/24 0235  WBC 13.5* 11.0* 7.8  NEUTROABS 11.7*  --   --   HGB 15.1* 14.3 13.1  HCT 45.0 42.2 38.1  MCV 95.9 93.0 93.4  PLT 360 349 359    LFT Recent Labs  Lab 03/03/24 1817 03/04/24 0746 03/05/24 0227 03/06/24 0235  AST 30 27 44* 20  ALT 19 12 18 14   ALKPHOS 108 68 93 72  BILITOT 0.6 0.4 0.4 0.4  PROT 7.3 4.6* 6.0* 5.0*  ALBUMIN 3.3* 2.1* 2.5* 2.1*     Antibiotics: Anti-infectives (From admission, onward)    Start     Dose/Rate Route Frequency Ordered Stop   03/06/24 0915  cefTRIAXone  (ROCEPHIN ) 1 g in sodium chloride  0.9 % 100 mL IVPB        1 g 200 mL/hr over 30 Minutes Intravenous Every 24 hours 03/06/24  0818 03/09/24 0914        CONSULTS   Code Status: DNR  Family Communication: No family at bedside     Subjective   Complaining of knee pain   Objective    Physical Examination:   General-appears in no acute distress Heart-S1-S2, regular, no murmur auscultated Lungs-clear to auscultation bilaterally, no wheezing or crackles auscultated Abdomen-soft, nontender, no organomegaly Extremities-bilateral 1+ edema in the lower extremities Neuro-alert, oriented x3, no focal deficit noted           Monica Conway   [facility] If 7PM-7AM, please contact night-coverage at [email or web address], Office  [phone]   03/06/2024, 2:04 PM  LOS: 3 days

## 2024-03-06 NOTE — Progress Notes (Signed)
 PHARMACY - ANTICOAGULATION CONSULT NOTE  Pharmacy Consult for heparin  infusion Indication: atrial fibrillation  Allergies[1]  Patient Measurements: Height: 5' 2 (157.5 cm) Weight: 40 kg (88 lb 2.9 oz) IBW/kg (Calculated) : 50.1 HEPARIN  DW (KG): 40.3  Vital Signs: Temp: 97.6 F (36.4 C) (01/11 2000) Temp Source: Oral (01/11 2000) BP: 103/69 (01/11 2000) Pulse Rate: 71 (01/11 2000)  Labs: Recent Labs    03/04/24 0746 03/05/24 0227 03/06/24 0235 03/06/24 1349 03/06/24 2204  HGB  --  14.3 13.1  --   --   HCT  --  42.2 38.1  --   --   PLT  --  349 359  --   --   HEPARINUNFRC  --   --   --  0.36 0.16*  CREATININE 1.45* 1.47* 1.14*  --   --     Estimated Creatinine Clearance: 26.1 mL/min (A) (by C-G formula based on SCr of 1.14 mg/dL (H)).   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 Patient admitted 03/03/2024 with a chief complaint of generalized weakness, poor oral intake, and worsening leg swelling. Given history of GI bleed, will target a tighter heparin  level range of 0.3-0.5.   Heparin  level 0.16, subtherapeutic  No issues with infusion per RN. No overt bleeding noted.   Goal of Therapy:  Heparin  level 0.3-0.5 units/ml Monitor platelets by anticoagulation protocol: Yes   Plan:  Increase heparin  infusion at 700 units/hr Check heparin   level in 8 hours and daily while on heparin  Continue to monitor H&H and platelets  Thank you for allowing pharmacy to participate in this patient's care.  Leonor GORMAN Bash, PharmD Emergency Medicine Clinical Pharmacist 03/06/2024,10:52 PM        [1]  Allergies Allergen Reactions   Pine Shortness Of Breath   Pollen Extract Shortness Of Breath   Caduet [Amlodipine -Atorvastatin] Other (See Comments)    Unknown reaction

## 2024-03-06 NOTE — Progress Notes (Signed)
 Patient has become severely agitated and combative, is unable to be redirected. Non-pharmacologic interventions and treatment of her reported pain have been ineffective. Plan to give a dose of Haldol .

## 2024-03-06 NOTE — Progress Notes (Signed)
 PHARMACY - ANTICOAGULATION CONSULT NOTE  Pharmacy Consult for heparin  infusion Indication: atrial fibrillation  Allergies[1]  Patient Measurements: Height: 5' 2 (157.5 cm) Weight: 40 kg (88 lb 2.9 oz) IBW/kg (Calculated) : 50.1 HEPARIN  DW (KG): 40.3  Vital Signs: Temp: 98 F (36.7 C) (01/11 1207) Temp Source: Oral (01/11 1207) BP: 100/78 (01/11 1234) Pulse Rate: 136 (01/11 1207)  Labs: Recent Labs    03/03/24 1817 03/04/24 0746 03/05/24 0227 03/06/24 0235  HGB 15.1*  --  14.3 13.1  HCT 45.0  --  42.2 38.1  PLT 360  --  349 359  CREATININE 2.42* 1.45* 1.47* 1.14*    Estimated Creatinine Clearance: 26.1 mL/min (A) (by C-G formula based on SCr of 1.14 mg/dL (H)).   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 Patient admitted 03/03/2024 with a chief complaint of generalized weakness, poor oral intake, and worsening leg swelling.   Given history of GI bleed, will target a tighter heparin  level range of 0.3-0.5. CBC appears numerically stable (Hgb 13.1, platelets wnl). No documented signs/symptoms of bleeding.   Goal of Therapy:  Heparin  level 0.3-0.5 units/ml Monitor platelets by anticoagulation protocol: Yes   Plan:  Give 2000 units bolus x 1 Start heparin  infusion at 550 units/hr Check heparin  level in 8 hours and daily while on  heparin  Continue to monitor H&H and platelets  Feliciano Close, PharmD PGY2 Infectious Diseases Pharmacy Resident       [1]  Allergies Allergen Reactions   Pine Shortness Of Breath   Pollen Extract Shortness Of Breath   Caduet [Amlodipine -Atorvastatin] Other (See Comments)    Unknown reaction

## 2024-03-06 NOTE — TOC Initial Note (Signed)
 Transition of Care [facility]) - Initial/Assessment Note    Patient Details  Name: Monica Conway MRN: [id number] Date of Birth: [birth year]  [facility]) CM/SW Contact:    Montie LOISE Louder, LCSW Phone Number: 03/06/2024, 12:43 PM  Clinical Narrative:           CSW met with patient at bedside, introduced self and explained role. CSW discussed with patient recommendations for short term rehab at [facility]. Patient declined SNF. She reports she receives assistance in the home w/ [facility] 7 days per week, 6am-12:30 pm, 12:30 pm - 5:30 pm & 5:30 pm to 8pm. She states she will need PTAR transport home.   Per chart review:   Pt was total A +2 for bed mobility and required maximal physical assistance of 2 for transfers via HHA on either side of patient. HX with SNF: d/c to [facility] on 12/29/23, [facility] 09/02,0/25  [facility] 04/09/23, if d/c to SNF she may be in co-pay status.    Palliative care consulted for goal of care   TOC will continue to follow and assist with d/c planning.     Montie Louder, MSW, LCSW Clinical Social Worker     Expected Discharge Plan: Home w Home Health Services Barriers to Discharge: Continued Medical Work up   Patient Goals and CMS Choice            Expected Discharge Plan and Services In-house Referral: Clinical Social Work                                            Prior Living Arrangements/Services   Lives with:: Self          Need for Family Participation in Patient Care: Yes (Comment) Care giver support system in place?: Yes (comment)   Criminal Activity/Legal Involvement Pertinent to Current Situation/Hospitalization: No - Comment as needed  Activities of Daily Living   ADL Screening (condition at time of admission) Independently performs ADLs?: No Does the patient have a NEW difficulty with bathing/dressing/toileting/self-feeding that is expected to last >3 days?: Yes (Initiates electronic  notice to provider for possible OT consult) Does the patient have a NEW difficulty with getting in/out of bed, walking, or climbing stairs that is expected to last >3 days?: Yes (Initiates electronic notice to provider for possible PT consult) Does the patient have a NEW difficulty with communication that is expected to last >3 days?: No Is the patient deaf or have difficulty hearing?: No Does the patient have difficulty seeing, even when wearing glasses/contacts?: No Does the patient have difficulty concentrating, remembering, or making decisions?: No  Permission Sought/Granted Permission sought to share information with : Family Supports Permission granted to share information with : Yes, Verbal Permission Granted  Share Information with NAME: [name] Alfredia     Permission granted to share info w Relationship: brother     Emotional Assessment Appearance:: Appears stated age     Orientation: : Oriented to Self, Oriented to Place, Oriented to  Time, Oriented to Situation Alcohol  / Substance Use: Not Applicable Psych Involvement: No (comment)  Admission diagnosis:  Dehydration [E86.0] Hypokalemia [E87.6] Hypomagnesemia [E83.42] Failure to thrive in adult [R62.7] AKI (acute kidney injury) [N17.9] Acute on chronic congestive heart failure, unspecified heart failure type [facility]) [I50.9] Patient Active Problem List   Diagnosis Date Noted   Acute on chronic congestive heart failure (  HCC) 03/04/2024   Failure to thrive in adult 03/03/2024   Cognitive impairment 12/29/2023   Lumbar compression fracture (HCC) 10/25/2023   History of stroke - 01-2023. felt to be due to afib. 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 coli UTI 02/05/2014   PCP:  [facility] Pharmacy:   [id number] [address], [place] - [address] AT [facility] & [address] [place] [address] Phone: [phone] Fax: [phone]     Social Drivers of Health (SDOH) Social History: SDOH Screenings   Food Insecurity: No Food Insecurity (03/05/2024)  Housing: Low Risk (03/05/2024)  Transportation Needs: Unmet Transportation Needs (03/05/2024)  Utilities: Not At Risk (03/05/2024)  Social Connections: Patient Declined (03/06/2024)  Tobacco Use: Low Risk (12/20/2023)   SDOH Interventions:     Readmission Risk Interventions    10/26/2023    1:57 PM 04/06/2023    8:44 AM  Readmission Risk Prevention Plan  Post Dischage Appt Complete   Medication Screening Complete   Transportation Screening Complete Complete  PCP or Specialist Appt within 3-5 Days  Complete  HRI or Home Care Consult  Complete  Social Work Consult for Recovery Care Planning/Counseling  Complete  Palliative Care Screening  Not Applicable  Medication Review Oceanographer)  Complete

## 2024-03-07 DIAGNOSIS — Z515 Encounter for palliative care: Secondary | ICD-10-CM

## 2024-03-07 DIAGNOSIS — R627 Adult failure to thrive: Secondary | ICD-10-CM | POA: Diagnosis not present

## 2024-03-07 LAB — CBC
HCT: 36.5 % (ref 36.0–46.0)
Hemoglobin: 12.1 g/dL (ref 12.0–15.0)
MCH: 31.6 pg (ref 26.0–34.0)
MCHC: 33.2 g/dL (ref 30.0–36.0)
MCV: 95.3 fL (ref 80.0–100.0)
Platelets: 316 K/uL (ref 150–400)
RBC: 3.83 MIL/uL — ABNORMAL LOW (ref 3.87–5.11)
RDW: 19 % — ABNORMAL HIGH (ref 11.5–15.5)
WBC: 6.4 K/uL (ref 4.0–10.5)
nRBC: 0 % (ref 0.0–0.2)

## 2024-03-07 LAB — COMPREHENSIVE METABOLIC PANEL WITH GFR
ALT: 14 U/L (ref 0–44)
AST: 21 U/L (ref 15–41)
Albumin: 2.1 g/dL — ABNORMAL LOW (ref 3.5–5.0)
Alkaline Phosphatase: 66 U/L (ref 38–126)
Anion gap: 10 (ref 5–15)
BUN: 27 mg/dL — ABNORMAL HIGH (ref 8–23)
CO2: 17 mmol/L — ABNORMAL LOW (ref 22–32)
Calcium: 7.7 mg/dL — ABNORMAL LOW (ref 8.9–10.3)
Chloride: 110 mmol/L (ref 98–111)
Creatinine, Ser: 1.15 mg/dL — ABNORMAL HIGH (ref 0.44–1.00)
GFR, Estimated: 49 mL/min — ABNORMAL LOW
Glucose, Bld: 84 mg/dL (ref 70–99)
Potassium: 4 mmol/L (ref 3.5–5.1)
Sodium: 137 mmol/L (ref 135–145)
Total Bilirubin: <0.2 mg/dL (ref 0.0–1.2)
Total Protein: 4.9 g/dL — ABNORMAL LOW (ref 6.5–8.1)

## 2024-03-07 LAB — HEPARIN LEVEL (UNFRACTIONATED): Heparin Unfractionated: 0.13 [IU]/mL — ABNORMAL LOW (ref 0.30–0.70)

## 2024-03-07 LAB — PHOSPHORUS: Phosphorus: 3.1 mg/dL (ref 2.5–4.6)

## 2024-03-07 LAB — MAGNESIUM: Magnesium: 1.7 mg/dL (ref 1.7–2.4)

## 2024-03-07 MED ORDER — DILTIAZEM HCL ER COATED BEADS 120 MG PO CP24
120.0000 mg | ORAL_CAPSULE | Freq: Every day | ORAL | Status: DC
  Administered 2024-03-09 – 2024-03-10 (×2): 120 mg via ORAL
  Filled 2024-03-07 (×4): qty 1

## 2024-03-07 MED ORDER — HALOPERIDOL LACTATE 5 MG/ML IJ SOLN
1.0000 mg | Freq: Four times a day (QID) | INTRAMUSCULAR | Status: DC | PRN
  Administered 2024-03-07 – 2024-03-08 (×2): 1 mg via INTRAVENOUS
  Filled 2024-03-07 (×2): qty 1

## 2024-03-07 NOTE — Discharge Instructions (Signed)
 TRANSPORTATION: -[place] Department of Health: Call [place] and [employer] at [phone] for details. [email or web address]  -[employer] [place]: [employer] [place] is the [employer]'s shared-ride transportation service for eligible riders who have a disability that prevents them from riding the fixed route bus. Call [phone]. [employer] [place] riders must pay a fare of $1.50 per trip, or may purchase a 10-ride punch card for $14.00 ($1.40 per ride) or a 40-ride punch card for $48.00 ($1.20 per ride).  -[employer] rideshare transportation service is provided for senior citizens (60+) who live independently within [place] city limits and are unable to drive or have limited access to transportation. Call [phone] to schedule an appointment.  -[place] Transportation: For Medicare or Medicaid recipients call 4452349620?[facility], wheelchair fleeta, and ambulatory quotes available.   MEDICAID TRANSPORTATION: -If you have a Medicaid blue card or pink card and have no other means for transportation to doctor's offices, clinics, dentists, hospitals, and other health related trip needs.  -Transportation services are available to all [place] locations. Trips to [facility] and [place] are provided in association with [employer]. -Services are provided between 6:00AM and 9:00PM Monday-Friday. -Call [phone] to schedule a trip or request further information.

## 2024-03-07 NOTE — Plan of Care (Signed)
" °  Problem: Clinical Measurements: Goal: Will remain free from infection Outcome: Progressing   Problem: Elimination: Goal: Will not experience complications related to bowel motility Outcome: Progressing Goal: Will not experience complications related to urinary retention Outcome: Progressing   Problem: Pain Managment: Goal: General experience of comfort will improve and/or be controlled Outcome: Progressing   Problem: Skin Integrity: Goal: Risk for impaired skin integrity will decrease Outcome: Progressing   Problem: Education: Goal: Knowledge of General Education information will improve Description: Including pain rating scale, medication(s)/side effects and non-pharmacologic comfort measures Outcome: Not Progressing   Problem: Health Behavior/Discharge Planning: Goal: Ability to manage health-related needs will improve Outcome: Not Progressing   Problem: Clinical Measurements: Goal: Ability to maintain clinical measurements within normal limits will improve Outcome: Not Progressing   Problem: Activity: Goal: Risk for activity intolerance will decrease Outcome: Not Progressing   Problem: Nutrition: Goal: Adequate nutrition will be maintained Outcome: Not Progressing   Problem: Coping: Goal: Level of anxiety will decrease Outcome: Not Progressing   Problem: Safety: Goal: Ability to remain free from injury will improve Outcome: Not Progressing   "

## 2024-03-07 NOTE — TOC Progression Note (Signed)
 Transition of Care [facility]) - Progression Note    Patient Details  Name: Monica Conway MRN: [id number] Date of Birth: 1945-08-12  Transition [facility] Care [facility]) CM/SW Contact  [name], [place] Phone Number: 03/07/2024, 3:54 PM  Clinical Narrative:   CSW informed by Palliative that patients brother is agreeable to SNF rehab for patient. CSW to do SNF workup at this time and provide bed offers.  CSW will continue to follow.    Expected Discharge Plan: Skilled Nursing Facility Barriers to Discharge: Continued Medical Work up, SNF Pending bed offer, Insurance Authorization               Expected Discharge Plan and Services In-house Referral: Clinical Social Work                                             Social Drivers of Health (SDOH) Interventions SDOH Screenings   Food Insecurity: No Food Insecurity (03/05/2024)  Housing: Low Risk (03/05/2024)  Transportation Needs: Unmet Transportation Needs (03/05/2024)  Utilities: Not At Risk (03/05/2024)  Social Connections: Patient Declined (03/06/2024)  Tobacco Use: Low Risk (12/20/2023)    Readmission Risk Interventions    10/26/2023    1:57 PM 04/06/2023    8:44 AM  Readmission Risk Prevention Plan  Post Dischage Appt Complete   Medication Screening Complete   Transportation Screening Complete Complete  PCP or Specialist Appt within 3-5 Days  Complete  HRI or Home Care Consult  Complete  Social Work Consult for Recovery Care Planning/Counseling  Complete  Palliative Care Screening  Not Applicable  Medication Review Oceanographer)  Complete

## 2024-03-07 NOTE — Consult Note (Signed)
 "                                                                                   Consultation Note Date: 03/07/2024   Patient Name: Monica Conway  DOB: Jan 02, 1946  MRN: [id number]  Age / Sex: 79 y.o., female  [facility]P: [facility] Referring Physician: Juvenal Harlene PENNER, DO  Reason for Consultation: Establishing goals of care  HPI/Patient Profile: 79 y.o. female  with past medical history of paroxysmal atrial fibrillation not on anticoagulation due to GIB, HTN, CVA, Barrett's esophagus, osteoporosis, bilateral knee osteoarthritis, severe protein calorie malnutrition (BMI < 16), failure to thrive admitted on 03/03/2024 with weakness with worsening bilateral lower leg edema. Also with AKI. Palliative consulted to assist with goals of care.   Clinical Assessment and Goals of Care: Consult received and chart review completed. I discussed with RN who reports that Monica Conway has been sleepy today after receiving haldol . She was yelling out often yesterday and last night. RN reports that she becomes fixated and requires much attention.   I met today with Monica Conway. She awakens. She agrees to try and eat some lunch. I assisted her to get set up and she feeds herself some meatloaf. She declines many other items on her tray telling me I can't eat that. I attempted to discuss her wishes and health. She does not engage in conversation or answer my questions. She dismisses me from her room.   I called and spoke with brother, [name]. [name] confirms that only he and a niece are family for Monica Conway. [name] shares that [name] has struggled with understanding and frustration. He reports she needs time and everything to be repeated 2-3 times in simple terms to process and understand. He reports that she has an extremely difficult time with previous UTIs. He acknowledges declining health and it is unclear to him what she is able to do for herself of not. She has caregivers daily 9a-12p and 3p-6p. Family  are not able to provide more assistance. He agrees that she should not be at home alone. He would like to pursue SNF. He reports that she did well last time she was at SNF at [facility] as she was motivated to improve and return home.   We further discussed goals of care. I discussed artificial feeding and he agrees that she would not want or tolerate a feeding tube. Acknowledging decline we also discussed goal to continue with current treatment with hopes of improvement. No desire for escalation of care to invasive, aggressive, ICU level care. Give her control over food choices and reassure her that the food is not spicy and she can tolerate. We agree to continue to treat what we can and keep her as happy and comfortable as possible.   All questions/concerns addressed. Emotional support provided.   Primary Decision Maker NEXT OF KIN    SUMMARY OF RECOMMENDATIONS   - DNR/DNI - No feeding tube - Continue treatment (avoid escalation to ICU level care) - Pursue SNF rehab  Code Status/Advance Care Planning: DNR   Symptom Management:  Per attending  Prognosis:  Overall prognosis poor.   Discharge Planning:  Skilled Nursing Facility for rehab with Palliative care service follow-up      Primary Diagnoses: Present on Admission:  Failure to thrive in adult   I have reviewed the medical record, interviewed the patient and family, and examined the patient. The following aspects are pertinent.  Past Medical History:  Diagnosis Date   Acute on chronic congestive heart failure (HCC) 03/04/2024   Arthritis    Asthma    Atrial fibrillation with RVR (HCC) 12/29/2023   Blood transfusion without reported diagnosis    Cerebrovascular accident (CVA) (HCC) 01/31/2023   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 (HCC) 01/26/2023   Closed left hip fracture (HCC) 08/03/2015   Hypertension    Hypokalemia 04/08/2023   Hypomagnesemia 12/29/2023   Metabolic acidosis 01/26/2023    Osteoporosis    Sepsis secondary to UTI (HCC) 12/18/2023   Social History   Socioeconomic History   Marital status: Divorced    Spouse name: Not on file   Number of children: Not on file   Years of education: Not on file   Highest education level: Not on file  Occupational History   Not on file  Tobacco Use   Smoking status: Never   Smokeless tobacco: Never  Substance and Sexual Activity   Alcohol  use: No   Drug use: No   Sexual activity: Not Currently  Other Topics Concern   Not on file  Social History Narrative   Not on file   Social Drivers of Health   Tobacco Use: Low Risk (12/20/2023)   Patient History    Smoking Tobacco Use: Never    Smokeless Tobacco Use: Never    Passive Exposure: Not on file  Financial Resource Strain: Not on file  Food Insecurity: No Food Insecurity (03/05/2024)   Epic    Worried About Programme Researcher, Broadcasting/film/video in the Last Year: Never true    Ran Out of Food in the Last Year: Never true  Transportation Needs: Unmet Transportation Needs (03/05/2024)   Epic    Lack of Transportation (Medical): Yes    Lack of Transportation (Non-Medical): Yes  Physical Activity: Not on file  Stress: Not on file  Social Connections: Patient Declined (03/06/2024)   Social Connection and Isolation Panel    Frequency of Communication with Friends and Family: Patient declined    Frequency of Social Gatherings with Friends and Family: Patient declined    Attends Religious Services: Patient declined    Active Member of Clubs or Organizations: Patient declined    Attends Banker Meetings: Patient declined    Marital Status: Patient declined  Depression (PHQ2-9): Not on file  Alcohol  Screen: Not on file  Housing: Low Risk (03/05/2024)   Epic    Unable to Pay for Housing in the Last Year: No    Number of Times Moved in the Last Year: 0    Homeless in the Last Year: No  Utilities: Not At Risk (03/05/2024)   Epic    Threatened with loss of utilities: No   Health Literacy: Not on file   Family History  Problem Relation Age of Onset   Cataracts Sister    Hypertension Sister    Hypertension Brother    Scheduled Meds:  B-complex with vitamin C  1 tablet Oral Daily   diclofenac  Sodium  2 g Topical QID   diltiazem   120 mg Oral Daily   feeding supplement  237 mL Oral BID BM   magnesium  oxide  400 mg Oral Daily  metoprolol  succinate  50 mg Oral Daily   multivitamin with minerals  1 tablet Oral Daily   pantoprazole   40 mg Oral BID   QUEtiapine   25 mg Oral Q2000   QUEtiapine   50 mg Oral QHS   sodium bicarbonate   650 mg Oral BID   thiamine   100 mg Oral Daily   Continuous Infusions:  cefTRIAXone  (ROCEPHIN )  IV Stopped (03/06/24 0953)   PRN Meds:.acetaminophen , melatonin, oxyCODONE , polyethylene glycol, prochlorperazine  Allergies[1] Review of Systems  Constitutional:  Positive for activity change and appetite change.    Physical Exam Vitals and nursing note reviewed.  Constitutional:      Appearance: She is cachectic. She is ill-appearing.  Cardiovascular:     Rate and Rhythm: Bradycardia present.  Pulmonary:     Effort: No tachypnea, accessory muscle usage or respiratory distress.  Abdominal:     Palpations: Abdomen is soft.  Neurological:     Mental Status: She is alert.     Comments: Uncooperative  Psychiatric:        Attention and Perception: She is inattentive.     Comments: Poor insight and understanding     Vital Signs: BP (!) 95/51 (BP Location: Left Leg)   Pulse (!) 59   Temp 97.9 F (36.6 C) (Oral)   Resp 20   Ht 5' 2 (1.575 m)   Wt 38.9 kg   SpO2 97%   BMI 15.69 kg/m  Pain Scale: 0-10   Pain Score: 3    SpO2: SpO2: 97 % O2 Device:SpO2: 97 % O2 Flow Rate: .   IO: Intake/output summary:  Intake/Output Summary (Last 24 hours) at 03/07/2024 0956 Last data filed at 03/06/2024 2000 Gross per 24 hour  Intake 295.08 ml  Output 250 ml  Net 45.08 ml    LBM: Last BM Date : 03/04/24 Baseline Weight:  Weight: 41.4 kg Most recent weight: Weight: 38.9 kg       Time Total: 80 min  Greater than 50%  of this time was spent counseling and coordinating care related to the above assessment and plan.  Signed by: Bernarda Kitty, NP Palliative Medicine Team Pager # [phone] (M-F 8a-5p) Team Phone # [phone] (Nights/Weekends)                     [1]  Allergies Allergen Reactions   Pine Shortness Of Breath   Pollen Extract Shortness Of Breath   Caduet [Amlodipine -Atorvastatin] Other (See Comments)    Unknown reaction   "

## 2024-03-07 NOTE — Care Management Important Message (Signed)
 Important Message  Patient Details  Name: Monica Conway MRN: [id number] Date of Birth: 11/15/1945   Important Message Given:  Yes - Medicare IM     [name] Sharps 03/07/2024, 11:41 AM

## 2024-03-07 NOTE — NC FL2 (Signed)
 " [place]  MEDICAID FL2 LEVEL OF CARE FORM     IDENTIFICATION  Patient Name: Monica Conway Birthdate: [birth year] Sex: female Admission Date (Current Location): 03/03/2024  [facility] [place] Number:  Producer, Television/film/video and Address:  [facility], [address] [place], [place] [address]      Provider Number: [id number]  Attending Physician Name and Address:  Juvenal Harlene PENNER, DO  Relative Name and Phone Number:  [name], Emergency Contact  [phone] [facility])    Current Level of Care: Hospital Recommended Level of Care: Skilled Nursing Facility Prior Approval Number:    Date Approved/Denied:   PASRR Number: [id number]  Discharge Plan: SNF    Current Diagnoses: Patient Active Problem List   Diagnosis Date Noted   Acute on chronic congestive heart failure (HCC) 03/04/2024   Failure to thrive in adult 03/03/2024   Cognitive impairment 12/29/2023   Lumbar compression fracture (HCC) 10/25/2023   History of stroke - 01-2023. felt to be due to afib. 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 coli UTI 02/05/2014    Orientation RESPIRATION BLADDER Height & Weight     Self, Time, Situation, Place  Normal Continent Weight: 85 lb 12.1 oz (38.9 kg) Height:  5' 2 (157.5 cm)  BEHAVIORAL SYMPTOMS/MOOD NEUROLOGICAL BOWEL NUTRITION STATUS      Continent Diet (Please see dc summary)  AMBULATORY STATUS COMMUNICATION OF NEEDS Skin   Extensive Assist Verbally Normal                       Personal Care Assistance Level of Assistance  Bathing, Feeding, Dressing Bathing Assistance: Maximum assistance Feeding assistance: Limited  assistance Dressing Assistance: Maximum assistance     Functional Limitations Info  Sight, Speech, Hearing Sight Info: Adequate Hearing Info: Impaired Speech Info: Adequate    SPECIAL CARE FACTORS FREQUENCY  PT (By licensed PT), OT (By licensed OT)     PT Frequency: 5x week OT Frequency: 5x week            Contractures Contractures Info: Not present    Additional Factors Info  Code Status, Allergies, Psychotropic Code Status Info: DNR limited Allergies Info: Pine, Pollen Extract, Caduet (Amlodipine -atorvastatin) Psychotropic Info: seroquel          Current Medications (03/07/2024):  This is the current hospital active medication list Current Facility-Administered Medications  Medication Dose Route Frequency Provider Last Rate Last Admin   acetaminophen  (TYLENOL ) tablet 1,000 mg  1,000 mg Oral Q6H PRN Vann, Jessica U, DO   1,000 mg at 03/06/24 2028   B-complex with vitamin C tablet 1 tablet  1 tablet Oral Daily Lama, Gagan S, MD   1 tablet at 03/06/24 0949   cefTRIAXone  (ROCEPHIN ) 1 g in sodium chloride  0.9 % 100 mL IVPB  1 g Intravenous Q24H Vann, Jessica U, DO   Stopped at 03/07/24 1103   diclofenac  Sodium (VOLTAREN ) 1 % topical gel 2 g  2 g Topical QID Vann, Jessica U, DO       diltiazem  (CARDIZEM  CD) 24 hr capsule 120 mg  120 mg Oral Daily Vann, Jessica U, DO       feeding  supplement (ENSURE PLUS HIGH PROTEIN) liquid 237 mL  237 mL Oral BID BM Drusilla, Sabas RAMAN, MD       haloperidol  lactate (HALDOL ) injection 1 mg  1 mg Intravenous Q6H PRN Vann, Jessica U, DO       magnesium  oxide (MAG-OX) tablet 400 mg  400 mg Oral Daily Vann, Jessica U, DO   400 mg at 03/06/24 1633   melatonin tablet 5 mg  5 mg Oral QHS PRN Shona Laurence N, DO   5 mg at 03/06/24 2028   metoprolol  succinate (TOPROL -XL) 24 hr tablet 50 mg  50 mg Oral Daily Vann, Jessica U, DO   50 mg at 03/07/24 1311   multivitamin with minerals tablet 1 tablet  1 tablet Oral Daily Drusilla Sabas RAMAN, MD   1 tablet at 03/06/24  9050   oxyCODONE  (Oxy IR/ROXICODONE ) immediate release tablet 2.5-5 mg  2.5-5 mg Oral BID PRN Vann, Jessica U, DO   5 mg at 03/06/24 2029   pantoprazole  (PROTONIX ) EC tablet 40 mg  40 mg Oral BID Vann, Jessica U, DO   40 mg at 03/06/24 2029   polyethylene glycol (MIRALAX  / GLYCOLAX ) packet 17 g  17 g Oral Daily PRN Shona Laurence N, DO       prochlorperazine  (COMPAZINE ) injection 5 mg  5 mg Intravenous Q6H PRN Hall, Carole N, DO   5 mg at 03/06/24 2030   QUEtiapine  (SEROQUEL ) tablet 25 mg  25 mg Oral Q2000 Reome, Earle J, RPH   25 mg at 03/07/24 1311   QUEtiapine  (SEROQUEL ) tablet 50 mg  50 mg Oral QHS Vann, Jessica U, DO   50 mg at 03/06/24 2030   sodium bicarbonate  tablet 650 mg  650 mg Oral BID Vann, Jessica U, DO   650 mg at 03/06/24 2028   thiamine  (VITAMIN B1) tablet 100 mg  100 mg Oral Daily Lama, Gagan S, MD   100 mg at 03/07/24 1311     Discharge Medications: Please see discharge summary for a list of discharge medications.  Relevant Imaging Results:  Relevant Lab Results:   Additional Information [id number]  Jex Strausbaugh C Aamina Skiff, LCSWA     "

## 2024-03-07 NOTE — Progress Notes (Signed)
 PHARMACY - ANTICOAGULATION  Pharmacy Consult for heparin  Indication: atrial fibrillation Brief A/P: Heparin  level subtherapeutic Increase Heparin  rate  Allergies[1]  Patient Measurements: Height: 5' 2 (157.5 cm) Weight: 40 kg (88 lb 2.9 oz) IBW/kg (Calculated) : 50.1 HEPARIN  DW (KG): 40.3  Vital Signs: Temp: 97.9 F (36.6 C) (01/11 2300) Temp Source: Oral (01/11 2300) BP: 99/53 (01/11 2300) Pulse Rate: 57 (01/11 2300)  Labs: Recent Labs    03/05/24 0227 03/06/24 0235 03/06/24 1349 03/06/24 2204 03/07/24 0205  HGB 14.3 13.1  --   --  12.1  HCT 42.2 38.1  --   --  36.5  PLT 349 359  --   --  316  HEPARINUNFRC  --   --  0.36 0.16* 0.13*  CREATININE 1.47* 1.14*  --   --  1.15*    Estimated Creatinine Clearance: 25.9 mL/min (A) (by C-G formula based on SCr of 1.15 mg/dL (H)).  Assessment: 79 y.o. female with Afib for heparin   Goal of Therapy:  Heparin  level 0.3-0.5 units/ml Monitor platelets by anticoagulation protocol: Yes   Plan:  Increase Heparin  800 units/hr Check heparin  level in 8 hours.   Cathlyn Arrant, PharmD, BCPS  03/07/2024,3:57 AM         [1]  Allergies Allergen Reactions   Pine Shortness Of Breath   Pollen Extract Shortness Of Breath   Caduet [Amlodipine -Atorvastatin] Other (See Comments)    Unknown reaction

## 2024-03-07 NOTE — Progress Notes (Signed)
 [facility]  PROGRESS NOTE  Turquoise Esch FMW:[id number] DOB: 12-14-1945 DOA: 03/03/2024 PCP: [facility]   Brief HPI:   79 y.o. female with medical history significant for paroxysmal A-fib not on anticoagulation due to history of GI bleed, hypertension, history of CVA, Barrett's esophagus, osteoporosis, bilateral knee osteoarthritis, severe protein calorie malnutrition, presents to the ER due to failure to thrive in an adult.  Associated with generalized weakness and poor oral intake.  Needs SNF placement but is refusing-palliative care consultation for goals of care.  Hospital stay has been complicated by A-fib with RVR and delirium.      Assessment/Plan:   A-fib with RVR - Was not restarted on Cardizem  and metoprolol  upon admission and went into A-fib with RVR on 1/11 - Will restart Cardizem  - Is not a candidate for long-term heparin  due to GI bleeding - Will restart metoprolol  with holding parameters  Failure to thrive in adult BMI 16 Severe protein mass loss. Albumin 3.3 Liberalize diet Nutritionist consult   AKI, prerenal in the setting of poor oral intake Presented with creatinine of 2.42 and BUN of 24 and GFR of 20 Baseline creatinine 0.8 with GFR greater than 60 -Resolved with IV fluids   High anion gap metabolic acidosis secondary to acute renal insufficiency -Resolved -Resume home medications   Elevated troponin, suspect demand ischemia No anginal symptoms reported No evidence of acute ischemia on twelve-lead EKG Presented with troponin 113, repeat 111. Continue to monitor on telemetry.   Generalized weakness PT OT evaluation-SNF placement but patient refused Fall precautions   History of CVA Resume home regimen -High risk of repeat CVA with A-fib not on blood thinner   Hypokalemia Replete  Hypocalcemia - Resolved  corrected serum calcium  9.4   Hypomagnesemia Replete   Questionable UTI - Will treat x 3 days with  Rocephin   Currently patient does not have the capacity to refuse placement.  This is due to delirium.  This will need to be reassessed daily    DVT prophylaxis:   Medications     B-complex with vitamin C  1 tablet Oral Daily   diclofenac  Sodium  2 g Topical QID   diltiazem   120 mg Oral Daily   feeding supplement  237 mL Oral BID BM   magnesium  oxide  400 mg Oral Daily   metoprolol  succinate  50 mg Oral Daily   multivitamin with minerals  1 tablet Oral Daily   pantoprazole   40 mg Oral BID   QUEtiapine   25 mg Oral Q2000   QUEtiapine   50 mg Oral QHS   sodium bicarbonate   650 mg Oral BID   thiamine   100 mg Oral Daily     Data Reviewed:   CBG:  No results for input(s): GLUCAP in the last 168 hours.  SpO2: 96 %    Vitals:   03/07/24 0300 03/07/24 0608 03/07/24 0755 03/07/24 1220  BP: (!) 83/43 (!) 119/52 (!) 95/51 105/66  Pulse: (!) 54 72 (!) 59 73  Resp: 15 13 20 13   Temp: 97.9 F (36.6 C)   (!) 97.3 F (36.3 C)  TempSrc: Oral   Oral  SpO2: 96% 97%  96%  Weight: 38.9 kg     Height: 5' 2 (1.575 m)         Data Reviewed:  Basic Metabolic Panel: Recent Labs  Lab 03/03/24 1817 03/04/24 0746 03/05/24 0227 03/06/24 0235 03/07/24 0205  NA 136 140 132* 135 137  K 2.4* 2.4* 3.7 3.2* 4.0  CL 99 114* 105 109 110  CO2 14* 10* 14* 16* 17*  GLUCOSE 84 60* 116* 88 84  BUN 34* 25* 27* 25* 27*  CREATININE 2.42* 1.45* 1.47* 1.14* 1.15*  CALCIUM  7.0* 5.3* 8.2* 7.9* 7.7*  MG 1.2* 3.2*  --  1.9 1.7  PHOS  --   --   --  2.9 3.1    CBC: Recent Labs  Lab 03/03/24 1817 03/05/24 0227 03/06/24 0235 03/07/24 0205  WBC 13.5* 11.0* 7.8 6.4  NEUTROABS 11.7*  --   --   --   HGB 15.1* 14.3 13.1 12.1  HCT 45.0 42.2 38.1 36.5  MCV 95.9 93.0 93.4 95.3  PLT 360 349 359 316    LFT Recent Labs  Lab 03/03/24 1817 03/04/24 0746 03/05/24 0227 03/06/24 0235 03/07/24 0205  AST 30 27 44* 20 21  ALT 19 12 18 14 14   ALKPHOS 108 68 93 72 66  BILITOT 0.6 0.4 0.4 0.4  <0.2  PROT 7.3 4.6* 6.0* 5.0* 4.9*  ALBUMIN 3.3* 2.1* 2.5* 2.1* 2.1*     Antibiotics: Anti-infectives (From admission, onward)    Start     Dose/Rate Route Frequency Ordered Stop   03/06/24 0915  cefTRIAXone  (ROCEPHIN ) 1 g in sodium chloride  0.9 % 100 mL IVPB        1 g 200 mL/hr over 30 Minutes Intravenous Every 24 hours 03/06/24 0818 03/09/24 0914        CONSULTS   Code Status: DNR  Family Communication: No family at bedside     Subjective   Confused-required Haldol  overnight   Objective    Physical Examination:   General: Appearance:    Thin female in no acute distress         Lungs:      respirations unlabored  Heart:    Normal heart rate.-Appears to be back in sinus  MS:   All extremities are intact.   Neurologic: Sleepy and confused             [name]   [facility] If 7PM-7AM, please contact night-coverage at [email or web address], Office  [phone]   03/07/2024, 1:14 PM  LOS: 4 days

## 2024-03-07 NOTE — Progress Notes (Signed)
" °   03/07/24 1617  Spiritual Encounters  Type of Visit Initial  Care provided to: Patient  Referral source Nurse (RN/NT/LPN)  Reason for visit Routine spiritual support  OnCall Visit No  Spiritual Framework  Presenting Themes Impactful experiences and emotions  Community/Connection None  Patient Stress Factors Health changes  Family Stress Factors None identified  Interventions  Spiritual Care Interventions Made Compassionate presence;Established relationship of care and support  Intervention Outcomes  Outcomes Awareness of support   Chaplain attempted visit; Pt was sleeping. Chaplain briefly engaged PT and asked if a follow-up visit could be offered tomorrow. Pt agreed. "

## 2024-03-08 LAB — COMPREHENSIVE METABOLIC PANEL WITH GFR
ALT: 19 U/L (ref 0–44)
AST: 37 U/L (ref 15–41)
Albumin: 2.4 g/dL — ABNORMAL LOW (ref 3.5–5.0)
Alkaline Phosphatase: 84 U/L (ref 38–126)
Anion gap: 13 (ref 5–15)
BUN: 21 mg/dL (ref 8–23)
CO2: 16 mmol/L — ABNORMAL LOW (ref 22–32)
Calcium: 8.2 mg/dL — ABNORMAL LOW (ref 8.9–10.3)
Chloride: 112 mmol/L — ABNORMAL HIGH (ref 98–111)
Creatinine, Ser: 1.15 mg/dL — ABNORMAL HIGH (ref 0.44–1.00)
GFR, Estimated: 49 mL/min — ABNORMAL LOW
Glucose, Bld: 83 mg/dL (ref 70–99)
Potassium: 4.5 mmol/L (ref 3.5–5.1)
Sodium: 142 mmol/L (ref 135–145)
Total Bilirubin: 0.2 mg/dL (ref 0.0–1.2)
Total Protein: 5.9 g/dL — ABNORMAL LOW (ref 6.5–8.1)

## 2024-03-08 LAB — CBC
HCT: 41.9 % (ref 36.0–46.0)
Hemoglobin: 13.8 g/dL (ref 12.0–15.0)
MCH: 31.5 pg (ref 26.0–34.0)
MCHC: 32.9 g/dL (ref 30.0–36.0)
MCV: 95.7 fL (ref 80.0–100.0)
Platelets: 394 K/uL (ref 150–400)
RBC: 4.38 MIL/uL (ref 3.87–5.11)
RDW: 19.6 % — ABNORMAL HIGH (ref 11.5–15.5)
WBC: 7.7 K/uL (ref 4.0–10.5)
nRBC: 0 % (ref 0.0–0.2)

## 2024-03-08 LAB — CULTURE, BLOOD (ROUTINE X 2)
Culture: NO GROWTH
Culture: NO GROWTH
Special Requests: ADEQUATE

## 2024-03-08 LAB — MAGNESIUM: Magnesium: 1.6 mg/dL — ABNORMAL LOW (ref 1.7–2.4)

## 2024-03-08 LAB — PHOSPHORUS: Phosphorus: 4 mg/dL (ref 2.5–4.6)

## 2024-03-08 MED ORDER — SENNOSIDES-DOCUSATE SODIUM 8.6-50 MG PO TABS
2.0000 | ORAL_TABLET | Freq: Every day | ORAL | Status: DC
  Administered 2024-03-08 – 2024-03-14 (×4): 2 via ORAL
  Filled 2024-03-08 (×5): qty 2

## 2024-03-08 MED ORDER — BISACODYL 10 MG RE SUPP: 10.0000 mg | Freq: Every day | RECTAL | Status: DC | PRN

## 2024-03-08 MED ORDER — HALOPERIDOL LACTATE 5 MG/ML IJ SOLN
3.0000 mg | Freq: Once | INTRAMUSCULAR | Status: AC | PRN
  Administered 2024-03-08: 3 mg via INTRAVENOUS
  Filled 2024-03-08: qty 1

## 2024-03-08 MED ORDER — HALOPERIDOL LACTATE 5 MG/ML IJ SOLN
3.0000 mg | Freq: Four times a day (QID) | INTRAMUSCULAR | Status: AC | PRN
  Administered 2024-03-09: 3 mg via INTRAVENOUS
  Filled 2024-03-08: qty 1

## 2024-03-08 NOTE — Progress Notes (Signed)
 Physical Therapy Treatment Patient Details Name: Monica Conway MRN: [id number] DOB: 09/28/1945 Today's Date: 03/08/2024   History of Present Illness 79 y.o. female presents to [facility] 03/04/23 with failure to thrive, generalized weakness, BLE edema, hypocalcemia and hypokalemia. PMHx: paroxysmal A-fib not on anticoagulation due to history of GI bleed, hypertension, history of CVA, Barrett's esophagus, osteoporosis, bilateral knee osteoarthritis, severe protein calorie malnutrition    PT Comments  Pt resting in bed on arrival, waxing and waning with agitation towards this PTA and participatory with PT session. Pt requiring mod A +2 to come to sitting EOB. Pt with good sitting balance, able to maintain without LE support. Pt with noted fear of falling, tearful and declining standing attempts this session despite max encouragement and reassurance. Pt able to scoot along EOB to [facility] with good LE/UE use with pt clearing hips each scoot x3. Patient will benefit from continued inpatient follow up therapy, <3 hours/day, will continue to follow acutely.    If plan is discharge home, recommend the following: Two people to help with walking and/or transfers;Two people to help with bathing/dressing/bathroom;Assistance with cooking/housework;Direct supervision/assist for medications management;Direct supervision/assist for financial management;Assist for transportation   Can travel by private vehicle     No  Equipment Recommendations  Hospital bed;Hoyer lift    Recommendations for Other Services       Precautions / Restrictions Precautions Precautions: Fall Recall of Precautions/Restrictions: Impaired Restrictions Weight Bearing Restrictions Per Provider Order: No     Mobility  Bed Mobility Overal bed mobility: Needs Assistance Bed Mobility: Supine to Sit, Sit to Supine     Supine to sit: Max assist, +2 for physical assistance Sit to supine: Min assist   General bed mobility comments: pt  needing mod A +2 for all aspects to come to stiting EOB, pt able to return trunk to supine and begin to bring LEs into bed but needing assist to lift up over EOB to complete    Transfers Overall transfer level: Needs assistance Equipment used: None Transfers: Bed to chair/wheelchair/BSC   Stand pivot transfers: Contact guard assist         General transfer comment: pt completing x3 lateral scoots along EOB with good UE/LE use with pt able to elevate hips from EOB to clear bottom to perform    Ambulation/Gait               General Gait Details: pt declining attempts   Stairs             Wheelchair Mobility     Tilt Bed    Modified Rankin (Stroke Patients Only)       Balance Overall balance assessment: Needs assistance Sitting-balance support: Bilateral upper extremity supported, Feet unsupported Sitting balance-Leahy Scale: Fair Sitting balance - Comments: requires BUE support for stability                                    Communication Communication Communication: Impaired Factors Affecting Communication: Hearing impaired  Cognition Arousal: Alert Behavior During Therapy: Restless, Agitated, Anxious   PT - Cognitive impairments: No family/caregiver present to determine baseline, Memory, Attention, Initiation, Sequencing, Problem solving, Safety/Judgement                       PT - Cognition Comments: Pt notably agitated throughout session, asking therapists to quit asking questions and to stop providing physical assistance despite needing physical  assistance for all mobility tasks. Following commands: Impaired Following commands impaired: Follows one step commands inconsistently    Cueing Cueing Techniques: Verbal cues, Tactile cues, Visual cues  Exercises      General Comments General comments (skin integrity, edema, etc.): VSS on RA, RN in room throughout session      Pertinent Vitals/Pain Pain Assessment Pain  Assessment: Faces Faces Pain Scale: Hurts a little bit Pain Location: generalized with mobility Pain Descriptors / Indicators: Discomfort, Grimacing, Guarding, Moaning Pain Intervention(s): Monitored during session, Limited activity within patient's tolerance    Home Living                          Prior Function            PT Goals (current goals can now be found in the care plan section) Acute Rehab PT Goals Patient Stated Goal: none stated this date PT Goal Formulation: With patient Time For Goal Achievement: 03/19/24 Progress towards PT goals: Progressing toward goals    Frequency    Min 2X/week      PT Plan      Co-evaluation              AM-PAC PT 6 Clicks Mobility   Outcome Measure  Help needed turning from your back to your side while in a flat bed without using bedrails?: A Lot Help needed moving from lying on your back to sitting on the side of a flat bed without using bedrails?: A Lot Help needed moving to and from a bed to a chair (including a wheelchair)?: Total Help needed standing up from a chair using your arms (e.g., wheelchair or bedside chair)?: Total Help needed to walk in hospital room?: Total Help needed climbing 3-5 steps with a railing? : Total 6 Click Score: 8    End of Session   Activity Tolerance: Treatment limited secondary to agitation Patient left: in bed;with call bell/phone within reach;with bed alarm set Nurse Communication: Mobility status PT Visit Diagnosis: Unsteadiness on feet (R26.81);Other abnormalities of gait and mobility (R26.89);Muscle weakness (generalized) (M62.81);Adult, failure to thrive (R62.7);Pain Pain - part of body: Leg     Time: 0940-1004 PT Time Calculation (min) (ACUTE ONLY): 24 min  Charges:    $Therapeutic Activity: 23-37 mins PT General Charges $$ ACUTE PT VISIT: 1 Visit                     Kelcy Baeten R. PTA Acute Rehabilitation Services Office: [phone]   Therisa CHRISTELLA Boor 03/08/2024, 1:52 PM

## 2024-03-08 NOTE — TOC Progression Note (Addendum)
 Transition of Care [facility]) - Progression Note    Patient Details  Name: Monica Conway MRN: [id number] Date of Birth: September 24, 1945  Transition of Care [facility]) CM/SW Contact  [name], [place] Phone Number: 03/08/2024, 2:01 PM  Clinical Narrative:   Per PT note, patient decline attempts to ambulate.   CSW spoke with patients brother Monica Conway and discussed bed offers for SNF. Monica Conway stated patient has history at [facility] and would like patient to go there at discharge. CSW reviewed insurance auth process and patients sessions with therapy. CSW informed Monica Conway that patient is declining to ambulate with therapy. CSW informed Monica Conway that if insurance does not approve SNF, may can look into an appeal, private pay costs at a SNF, or may need to look at patient going home. Per Palliative, patient has care from 9a-12p and 3p-6p .   4:04 PM CSW to await when patient is more medically stable to submit for insurance auth.  CSW will continue to follow.    Expected Discharge Plan: Skilled Nursing Facility Barriers to Discharge: Continued Medical Work up, SNF Pending bed offer, Insurance Authorization               Expected Discharge Plan and Services In-house Referral: Clinical Social Work                                             Social Drivers of Health (SDOH) Interventions SDOH Screenings   Food Insecurity: No Food Insecurity (03/05/2024)  Housing: Low Risk (03/05/2024)  Transportation Needs: Unmet Transportation Needs (03/05/2024)  Utilities: Not At Risk (03/05/2024)  Social Connections: Patient Declined (03/06/2024)  Tobacco Use: Low Risk (12/20/2023)    Readmission Risk Interventions    10/26/2023    1:57 PM 04/06/2023    8:44 AM  Readmission Risk Prevention Plan  Post Dischage Appt Complete   Medication Screening Complete   Transportation Screening Complete Complete  PCP or Specialist Appt within 3-5 Days  Complete  HRI or Home Care Consult  Complete   Social Work Consult for Recovery Care Planning/Counseling  Complete  Palliative Care Screening  Not Applicable  Medication Review Oceanographer)  Complete

## 2024-03-08 NOTE — Progress Notes (Signed)
 VAT: Consult for PIV insertion  Arrived to room and noted PIV in LUA. Assessed site and noted leaking at insertion site. LUA PIV removed. Attempted assessment for new PIV placement; no viable veins identified. No IV meds or fluids currently ordered. PIV deferred at this time. Care RN notified via secure chat. Care RN to place new consult if pt condition changes  Consult complete.

## 2024-03-08 NOTE — Progress Notes (Signed)
 Patient continues to have issues with delirium.  Have adjusted haldol  dose.  No BM since 1/9 per chart so bowel regimen has been ordered. No urinary retention.  Will remove tele to avoid agitating patient further Harlene Bowl DO

## 2024-03-08 NOTE — Progress Notes (Signed)
 Patient agitated and confused overnight, difficult to redirect or reorient.  Only intermittently aware that she is hospitalized.   Unable to give HS medications or keep tele on.  MD aware.  PRN haldol  given around 2300 with minimal effect.  One time order by doctor for extra 3mg  of haldol .

## 2024-03-08 NOTE — Progress Notes (Signed)
 [facility]  PROGRESS NOTE  Mckenzey Parcell FMW:[id number] DOB: Oct 08, 1945 DOA: 03/03/2024 PCP: [facility]   Brief HPI:   79 y.o. female with medical history significant for paroxysmal A-fib not on anticoagulation due to history of GI bleed, hypertension, history of CVA, Barrett's esophagus, osteoporosis, bilateral knee osteoarthritis, severe protein calorie malnutrition, presents to the ER due to failure to thrive in an adult.  Associated with generalized weakness and poor oral intake.    Hospital stay has been complicated by A-fib with RVR and delirium.  Palliative care following and plan is for SNF.  No artificial feeding.    Assessment/Plan:   A-fib with RVR - Was not restarted on Cardizem  and metoprolol  upon admission and went into A-fib with RVR on 1/11 - Will restart Cardizem  - Is not a candidate for long-term heparin  due to GI bleeding - Will restart metoprolol  with holding parameters  Failure to thrive in adult BMI 16 Severe protein mass loss. Nutritionist consult -Palliative care consultation: Family would like to pursue SNF for now.  He also states that she would not want a feeding tube and she would like to be DNR.   AKI, prerenal in the setting of poor oral intake Presented with creatinine of 2.42 and BUN of 24 and GFR of 20 Baseline creatinine 0.8 with GFR greater than 60 - Improved with IV fluids   High anion gap metabolic acidosis secondary to acute renal insufficiency -Resolved -Resume home medications   Elevated troponin, suspect demand ischemia No anginal symptoms reported No evidence of acute ischemia on twelve-lead EKG   Generalized weakness PT OT evaluation-SNF placement but patient refused Fall precautions   History of CVA Resume home regimen -High risk of repeat CVA with A-fib not on blood thinner   Hypokalemia Replete  Hypocalcemia - Resolved  corrected serum calcium  9.4   Hypomagnesemia Replete   Questionable UTI -  Will treat x 3 days with Rocephin   Delirium - Delirium precautions - Telemetry sitter - Can DC telemetry if patient continues to remove  Currently patient does not have the capacity to refuse placement.  This is due to delirium.  This will need to be reassessed daily    DVT prophylaxis:   Medications     B-complex with vitamin C  1 tablet Oral Daily   diclofenac  Sodium  2 g Topical QID   diltiazem   120 mg Oral Daily   feeding supplement  237 mL Oral BID BM   magnesium  oxide  400 mg Oral Daily   metoprolol  succinate  50 mg Oral Daily   multivitamin with minerals  1 tablet Oral Daily   pantoprazole   40 mg Oral BID   QUEtiapine   25 mg Oral Q2000   QUEtiapine   50 mg Oral QHS   sodium bicarbonate   650 mg Oral BID   thiamine   100 mg Oral Daily     Data Reviewed:   CBG:  No results for input(s): GLUCAP in the last 168 hours.  SpO2: 98 %    Vitals:   03/07/24 1934 03/07/24 2312 03/08/24 0500 03/08/24 0535  BP: 113/70 129/78  (!) 110/55  Pulse: (!) 52 60  62  Resp: 14 16  18   Temp: (!) 97.5 F (36.4 C) 97.6 F (36.4 C)  97.9 F (36.6 C)  TempSrc: Oral Oral  Axillary  SpO2: 98% 98%  98%  Weight:   38.9 kg   Height:          Data Reviewed:  Basic  Metabolic Panel: Recent Labs  Lab 03/03/24 1817 03/04/24 0746 03/05/24 0227 03/06/24 0235 03/07/24 0205  NA 136 140 132* 135 137  K 2.4* 2.4* 3.7 3.2* 4.0  CL 99 114* 105 109 110  CO2 14* 10* 14* 16* 17*  GLUCOSE 84 60* 116* 88 84  BUN 34* 25* 27* 25* 27*  CREATININE 2.42* 1.45* 1.47* 1.14* 1.15*  CALCIUM  7.0* 5.3* 8.2* 7.9* 7.7*  MG 1.2* 3.2*  --  1.9 1.7  PHOS  --   --   --  2.9 3.1    CBC: Recent Labs  Lab 03/03/24 1817 03/05/24 0227 03/06/24 0235 03/07/24 0205  WBC 13.5* 11.0* 7.8 6.4  NEUTROABS 11.7*  --   --   --   HGB 15.1* 14.3 13.1 12.1  HCT 45.0 42.2 38.1 36.5  MCV 95.9 93.0 93.4 95.3  PLT 360 349 359 316    LFT Recent Labs  Lab 03/03/24 1817 03/04/24 0746 03/05/24 0227  03/06/24 0235 03/07/24 0205  AST 30 27 44* 20 21  ALT 19 12 18 14 14   ALKPHOS 108 68 93 72 66  BILITOT 0.6 0.4 0.4 0.4 <0.2  PROT 7.3 4.6* 6.0* 5.0* 4.9*  ALBUMIN 3.3* 2.1* 2.5* 2.1* 2.1*     Antibiotics: Anti-infectives (From admission, onward)    Start     Dose/Rate Route Frequency Ordered Stop   03/06/24 0915  cefTRIAXone  (ROCEPHIN ) 1 g in sodium chloride  0.9 % 100 mL IVPB        1 g 200 mL/hr over 30 Minutes Intravenous Every 24 hours 03/06/24 0818 03/09/24 0914        CONSULTS   Code Status: DNR  Family Communication: No family at bedside     Subjective   Continues to have periods of agitation and confusion-required Haldol  overnight   Objective    Physical Examination:   General: Appearance:    Thin female in no acute distress         Lungs:      respirations unlabored  Heart:    Normal heart rate.-Appears to be back in sinus  MS:   All extremities are intact.   Neurologic: Sleepy and confused             [name]   [facility] If 7PM-7AM, please contact night-coverage at [email or web address], Office  [phone]   03/08/2024, 10:45 AM  LOS: 5 days

## 2024-03-08 NOTE — Plan of Care (Signed)
 PT agitated, verbally aggressive. PT is difficult to comfort. Tele sitter brought in to help keep patient in bed and from removing lines. CCMD notified of patient not keeping monitor attached.MD informed medication not helping to keep calm.

## 2024-03-08 NOTE — Progress Notes (Signed)
 Patient is unable to answer any admission questions due to confusion.  Admissions RN

## 2024-03-09 DIAGNOSIS — R627 Adult failure to thrive: Secondary | ICD-10-CM | POA: Diagnosis not present

## 2024-03-09 MED ORDER — MAGNESIUM SULFATE 2 GM/50ML IV SOLN
2.0000 g | Freq: Once | INTRAVENOUS | Status: DC
  Filled 2024-03-09: qty 50

## 2024-03-09 MED ORDER — FUROSEMIDE 10 MG/ML IJ SOLN
40.0000 mg | Freq: Once | INTRAMUSCULAR | Status: AC
  Administered 2024-03-09: 40 mg via INTRAVENOUS
  Filled 2024-03-09: qty 4

## 2024-03-09 NOTE — Progress Notes (Signed)
 Nutrition Follow-up  DOCUMENTATION CODES:  Severe malnutrition in context of chronic illness, Underweight  INTERVENTION:  Continue regular diet as ordered Support nutritional intake via nutrition supplements and encouragement; no desire for artificial nutrition Ensure Plus High Protein po BID, each supplement provides 350 kcal and 20 grams of protein. Magic cup TID with meals, each supplement provides 290 kcal and 9 grams of protein MVI with minerals daily Consider increase in bowel regimen given no documented BM x4 days  NUTRITION DIAGNOSIS:  Severe Malnutrition related to chronic illness (FTT) as evidenced by severe muscle depletion, percent weight loss, energy intake < or equal to 75% for > or equal to 1 month. - diagnosis updated 1/14  GOAL:  Patient will meet greater than or equal to 90% of their needs - goal unmet  MONITOR:  PO intake, Supplement acceptance, Labs, Weight trends, Skin, I & O's  REASON FOR ASSESSMENT:  Consult Assessment of nutrition requirement/status  ASSESSMENT:  79 y/o female with h/o CHF, HTN, PAF, stroke, PUD, proctocolitis, asthma, Barrett's esophagus, hiatal hernia, erosive gastritits, compression fracture and dementia who is admitted with FTT and AKI.  Admission c/b a-fib with RVR and delirium.  Palliative Care following. Plan for discharge to SNF once off haldol  x24 hours.  No desire for artificial nutrition support.   Checked in with patient at bedside. She is pleasant but a limited historian.  Completing medication in apple sauce at time of visit. She had breakfast delivered though denies wanting any. Discussed with RN. Plan to encourage pt to consume a bit of breakfast and an ensure as able.   Meal completions: 1/10: 0% breakfast 1/11: 5% dinner 1/13: 5% breakfast  Admit weight: 41.4 kg  Current weight: 38.2 kg  Of note between 04/09/23-03/09/24 pt is noted t have a weight loss of 30% which is clinically significant for time frame.    Medications: B-complex with C daily, mag-ox, MVI, senna 2 tablets daily, sodium bicarbonate , thiamine   Labs:   Chloride 112 Cr 1.15 Mg 1.6 GFR 49  NUTRITION - FOCUSED PHYSICAL EXAM: Flowsheet Row Most Recent Value  Orbital Region Moderate depletion  Upper Arm Region Severe depletion  Thoracic and Lumbar Region Unable to assess  [positioning in bed]  Buccal Region Moderate depletion  Temple Region Severe depletion  Clavicle Bone Region Severe depletion  Clavicle and Acromion Bone Region Severe depletion  Scapular Bone Region Severe depletion  Dorsal Hand Severe depletion  Patellar Region Severe depletion  Anterior Thigh Region Severe depletion  Posterior Calf Region Severe depletion  Edema (RD Assessment) Mild  [non-pitting BLE]  Hair Reviewed  Eyes Unable to assess  [kept eyes closed during assessment]  Mouth Reviewed  Skin Reviewed  Nails Reviewed    Diet Order:   Diet Order             Diet regular Room service appropriate? Yes; Fluid consistency: Thin  Diet effective now                   EDUCATION NEEDS:  Not appropriate for education at this time  Skin:  Skin Assessment: Reviewed RN Assessment (ecchymosis)  Last BM:  1/10  Height:  Ht Readings from Last 1 Encounters:  03/07/24 5' 2 (1.575 m)    Weight:  Wt Readings from Last 1 Encounters:  03/09/24 38.2 kg    Ideal Body Weight:  50 kg  BMI:  Body mass index is 15.4 kg/m.  Estimated Nutritional Needs:   Kcal:  1200-1400kcal/day  Protein:  60-70g/day  Fluid:  1.0-1.2L/day  Royce Maris, RDN, LDN Clinical Nutrition See AMiON for contact information.

## 2024-03-09 NOTE — Progress Notes (Signed)
 Physical Therapy Treatment Patient Details Name: Semiyah Newgent MRN: [id number] DOB: June 08, 1945 Today's Date: 03/09/2024   History of Present Illness 79 y.o. female presents to [facility] 03/04/23 with failure to thrive, generalized weakness, BLE edema, hypocalcemia and hypokalemia. PMHx: paroxysmal A-fib not on anticoagulation due to history of GI bleed, hypertension, history of CVA, Barrett's esophagus, osteoporosis, bilateral knee osteoarthritis, severe protein calorie malnutrition    PT Comments  Pt willing to trial taking steps towards [facility] this session, requiring maximal physical assistance for maintaining upright, and physical assistance for facilitating weight shift to advance opposite LE and for walker management. Heavy verbal cueing for step by step sequencing throughout. Pt would benefit from further transfer and pre-gait training. PT will continue to treat pt while she is admitted. Patient will benefit from continued inpatient follow up therapy, <3 hours/day.     If plan is discharge home, recommend the following: Two people to help with walking and/or transfers;Two people to help with bathing/dressing/bathroom;Assistance with cooking/housework;Direct supervision/assist for medications management;Direct supervision/assist for financial management;Assist for transportation   Can travel by private vehicle     No  Equipment Recommendations  Hospital bed;Hoyer lift    Recommendations for Other Services       Precautions / Restrictions Precautions Precautions: Fall Recall of Precautions/Restrictions: Impaired Restrictions Weight Bearing Restrictions Per Provider Order: No     Mobility  Bed Mobility Overal bed mobility: Needs Assistance Bed Mobility: Supine to Sit, Sit to Supine Rolling: Max assist, Used rails   Supine to sit: Max assist, +2 for physical assistance, HOB elevated Sit to supine: Max assist, +2 for physical assistance   General bed mobility comments: For supine to  sit on L side of bed, pt is able to advance BLEs towards EOB but has difficulty mobilizing them completely off EOB, requiring physical asssitance for BLE management and for trunk via bed pad. At end of session, pt completed sit to supine with HOB flat and maximal physical assistance of 2 for BLE and trunk management. Increased time to complete.    Transfers Overall transfer level: Needs assistance Equipment used: Rolling walker (2 wheels) Transfers: Sit to/from Stand Sit to Stand: Max assist, +2 physical assistance           General transfer comment: Pt completed sit to stand from EOB with RW and maximal physical assistance of 2 on each side of patient for initial power-up. Pt requires physical facilitation for increased knee flexion bilaterally, and VC for hand placement and to encourage forward trunk lean. Increased time to complete.    Ambulation/Gait             Pre-gait activities: Pt took lateral steps at EOB with maximal physical assistance of 1 to remain standing and second person providing physical facilitation of weight shift to advance opposite LE as well as physical assistance for walker management. VC for step by step sequencing; increased time to complete.     Stairs             Wheelchair Mobility     Tilt Bed    Modified Rankin (Stroke Patients Only)       Balance Overall balance assessment: Needs assistance Sitting-balance support: Bilateral upper extremity supported, Feet unsupported Sitting balance-Leahy Scale: Fair Sitting balance - Comments: able to sit EOB with BUE supported on walker and no LOB. When sitting without UE support, pt requires maximal physical assistance for remaining upright, as she tends to lean posteriorly. Postural control: Posterior lean Standing balance support: Bilateral  upper extremity supported, During functional activity, Reliant on assistive device for balance Standing balance-Leahy Scale: Zero Standing balance  comment: reliant on external support for maintaining upright                            Communication Communication Communication: Impaired Factors Affecting Communication: Hearing impaired  Cognition Arousal: Lethargic Behavior During Therapy: Restless, Anxious   PT - Cognitive impairments: No family/caregiver present to determine baseline, Memory, Attention, Initiation, Sequencing, Problem solving, Safety/Judgement                       PT - Cognition Comments: Pt repeating, please help me throughout session but is not able to verbalize specifically what she needs help with. Following commands: Impaired Following commands impaired: Follows one step commands inconsistently    Cueing Cueing Techniques: Verbal cues, Tactile cues, Gestural cues  Exercises      General Comments General comments (skin integrity, edema, etc.): VSS on RA      Pertinent Vitals/Pain Pain Assessment Pain Assessment: Faces Faces Pain Scale: Hurts little more Pain Location: bilateral knees Pain Descriptors / Indicators: Discomfort, Grimacing, Guarding, Moaning Pain Intervention(s): Limited activity within patient's tolerance, Monitored during session    Home Living                          Prior Function            PT Goals (current goals can now be found in the care plan section) Acute Rehab PT Goals Patient Stated Goal: none stated this date PT Goal Formulation: With patient Time For Goal Achievement: 03/19/24 Potential to Achieve Goals: Poor Progress towards PT goals: Not progressing toward goals - comment    Frequency    Min 2X/week      PT Plan      Co-evaluation              AM-PAC PT 6 Clicks Mobility   Outcome Measure  Help needed turning from your back to your side while in a flat bed without using bedrails?: A Lot Help needed moving from lying on your back to sitting on the side of a flat bed without using bedrails?: A Lot Help  needed moving to and from a bed to a chair (including a wheelchair)?: Total Help needed standing up from a chair using your arms (e.g., wheelchair or bedside chair)?: Total Help needed to walk in hospital room?: Total Help needed climbing 3-5 steps with a railing? : Total 6 Click Score: 8    End of Session Equipment Utilized During Treatment: Gait belt Activity Tolerance: Patient tolerated treatment well Patient left: in bed;with call bell/phone within reach;with bed alarm set Nurse Communication: Mobility status;Other (comment) (pt reporting pain in bilateral knees) PT Visit Diagnosis: Unsteadiness on feet (R26.81);Other abnormalities of gait and mobility (R26.89);Muscle weakness (generalized) (M62.81);Adult, failure to thrive (R62.7);Pain Pain - part of body: Knee     Time: 8598-8574 PT Time Calculation (min) (ACUTE ONLY): 24 min  Charges:    $Therapeutic Activity: 23-37 mins PT General Charges $$ ACUTE PT VISIT: 1 Visit                     Leontine Hilt DPT Acute Rehab Services [phone] Prefer contact via chat    Leontine NOVAK Champ Keetch 03/09/2024, 3:15 PM

## 2024-03-09 NOTE — Plan of Care (Signed)
   Problem: Clinical Measurements: Goal: Cardiovascular complication will be avoided Outcome: Progressing   Problem: Activity: Goal: Risk for activity intolerance will decrease Outcome: Progressing

## 2024-03-09 NOTE — Progress Notes (Signed)
 Patient asking about her dentures. Phoned brother [name]. He states he will go look at her house for them and bring them.  Monica JONETTA Collier, RN

## 2024-03-09 NOTE — Progress Notes (Signed)
 Haldol  was given pt confused pulling off her gown and trying to get out of bed.

## 2024-03-09 NOTE — TOC Progression Note (Addendum)
 Transition of Care [facility]) - Progression Note    Patient Details  Name: Monica Conway MRN: [id number] Date of Birth: 05-24-1945  Transition of [facility]) CM/SW Contact  [name], [place] Phone Number: 03/09/2024, 10:41 AM  Clinical Narrative:   CSW informed [facility] of families acceptance of facility. Per Jon, patient will have to remain off Haldol  for 24 hrs prior to discharging. CSW to submit for auth.  10:52 AM Auth pending at this time. ID [id number].   4:11 PM CSW uploaded updated PT note.    Expected Discharge Plan: Skilled Nursing Facility Barriers to Discharge: Continued Medical Work up, SNF Pending bed offer, Insurance Authorization               Expected Discharge Plan and Services In-house Referral: Clinical Social Work                                             Social Drivers of Health (SDOH) Interventions SDOH Screenings   Food Insecurity: No Food Insecurity (03/05/2024)  Housing: Low Risk (03/05/2024)  Transportation Needs: Unmet Transportation Needs (03/05/2024)  Utilities: Not At Risk (03/05/2024)  Social Connections: Patient Declined (03/06/2024)  Tobacco Use: Low Risk (12/20/2023)    Readmission Risk Interventions    10/26/2023    1:57 PM 04/06/2023    8:44 AM  Readmission Risk Prevention Plan  Post Dischage Appt Complete   Medication Screening Complete   Transportation Screening Complete Complete  PCP or Specialist Appt within 3-5 Days  Complete  HRI or Home Care Consult  Complete  Social Work Consult for Recovery Care Planning/Counseling  Complete  Palliative Care Screening  Not Applicable  Medication Review [employer])  Complete

## 2024-03-09 NOTE — Progress Notes (Signed)
 Physical Therapy Treatment Patient Details Name: Monica Conway MRN: [id number] DOB: 08-06-1945 Today's Date: 03/09/2024   History of Present Illness 79 y.o. female presents to [facility] 03/04/23 with failure to thrive, generalized weakness, BLE edema, hypocalcemia and hypokalemia. PMHx: paroxysmal A-fib not on anticoagulation due to history of GI bleed, hypertension, history of CVA, Barrett's esophagus, osteoporosis, bilateral knee osteoarthritis, severe protein calorie malnutrition    PT Comments  Assisted nursing with changing pt's bed padding, facilitating rolling L and R with maximal physical assistance and max cueing for mobility tasks. Pt is able to hold position for ~30 seconds with moderate physical assistance. Pt left sitting up in bed for lunch. Plan to follow up later for further mobility progression. PT will continue to treat pt while she is admitted. Patient will benefit from continued inpatient follow up therapy, <3 hours/day.     If plan is discharge home, recommend the following: Two people to help with walking and/or transfers;Two people to help with bathing/dressing/bathroom;Assistance with cooking/housework;Direct supervision/assist for medications management;Direct supervision/assist for financial management;Assist for transportation   Can travel by private vehicle     No  Equipment Recommendations  Hospital bed;Hoyer lift    Recommendations for Other Services       Precautions / Restrictions Precautions Precautions: Fall Recall of Precautions/Restrictions: Impaired Restrictions Weight Bearing Restrictions Per Provider Order: No     Mobility  Bed Mobility Overal bed mobility: Needs Assistance Bed Mobility: Rolling Rolling: Max assist, Used rails         General bed mobility comments: Pt rolled L and R for new bed pad placement with maximal physical assistance for trunk management, and VC for reaching across body to opposite bed railing. Pt able to hold position  for ~30s with minimal physical assistance. Pt is then total A for situating towards HOB via bed pad. Increased time and effort to complete.    Transfers                        Ambulation/Gait                   Stairs             Wheelchair Mobility     Tilt Bed    Modified Rankin (Stroke Patients Only)       Balance Overall balance assessment: Needs assistance                                          Communication Communication Communication: Impaired Factors Affecting Communication: Hearing impaired  Cognition Arousal: Alert Behavior During Therapy: Restless   PT - Cognitive impairments: No family/caregiver present to determine baseline, Memory, Attention, Initiation, Sequencing, Problem solving, Safety/Judgement                         Following commands: Impaired Following commands impaired: Follows one step commands inconsistently    Cueing Cueing Techniques: Verbal cues, Tactile cues, Gestural cues  Exercises      General Comments General comments (skin integrity, edema, etc.): VSS on RA      Pertinent Vitals/Pain Pain Assessment Pain Assessment: Faces Faces Pain Scale: Hurts a little bit Pain Location: generalized with mobility Pain Descriptors / Indicators: Discomfort, Grimacing, Guarding, Moaning Pain Intervention(s): Limited activity within patient's tolerance, Monitored during session    Home Living  Prior Function            PT Goals (current goals can now be found in the care plan section) Acute Rehab PT Goals Patient Stated Goal: none stated this date PT Goal Formulation: With patient Time For Goal Achievement: 03/19/24 Potential to Achieve Goals: Poor Progress towards PT goals: Not progressing toward goals - comment    Frequency    Min 2X/week      PT Plan      Co-evaluation              AM-PAC PT 6 Clicks Mobility   Outcome  Measure  Help needed turning from your back to your side while in a flat bed without using bedrails?: A Lot Help needed moving from lying on your back to sitting on the side of a flat bed without using bedrails?: A Lot Help needed moving to and from a bed to a chair (including a wheelchair)?: Total Help needed standing up from a chair using your arms (e.g., wheelchair or bedside chair)?: Total Help needed to walk in hospital room?: Total Help needed climbing 3-5 steps with a railing? : Total 6 Click Score: 8    End of Session   Activity Tolerance: Patient tolerated treatment well Patient left: in bed;with call bell/phone within reach;with bed alarm set;with nursing/sitter in room Nurse Communication: Mobility status PT Visit Diagnosis: Unsteadiness on feet (R26.81);Other abnormalities of gait and mobility (R26.89);Muscle weakness (generalized) (M62.81);Adult, failure to thrive (R62.7);Pain     Time: 1205-1220 PT Time Calculation (min) (ACUTE ONLY): 15 min  Charges:    $Therapeutic Activity: 8-22 mins PT General Charges $$ ACUTE PT VISIT: 1 Visit                     Leontine Hilt DPT Acute Rehab Services [phone] Prefer contact via chat    Leontine NOVAK Dejae Bernet 03/09/2024, 1:55 PM

## 2024-03-09 NOTE — Plan of Care (Signed)
" °  Problem: Health Behavior/Discharge Planning: Goal: Ability to manage health-related needs will improve Outcome: Progressing   Problem: Clinical Measurements: Goal: Diagnostic test results will improve Outcome: Progressing Goal: Cardiovascular complication will be avoided Outcome: Progressing   Problem: Coping: Goal: Level of anxiety will decrease Outcome: Progressing   Problem: Skin Integrity: Goal: Risk for impaired skin integrity will decrease Outcome: Progressing   "

## 2024-03-09 NOTE — Progress Notes (Addendum)
 " PROGRESS NOTE    Monica Conway  FMW:[id number] DOB: Jan 10, 1946 DOA: 03/03/2024 PCP: [facility]  79 y.o. female with medical history significant for paroxysmal A-fib not on anticoagulation due to history of GI bleed, hypertension, history of CVA, Barrett's esophagus, osteoporosis, bilateral knee osteoarthritis, severe protein calorie malnutrition, presents to the ER due to failure to thrive in an adult.  Associated with generalized weakness and poor oral intake.    Hospital stay has been complicated by A-fib with RVR and delirium.  Palliative care following and plan is for SNF.  No artificial feeding.   Subjective:   Assessment and Plan: A-fib with RVR - HR improved, echo this admit w/ preserved EF and mildly low RV -back on home dose of cardizem  and toprol  - Is not a candidate for long-term anticoagulation from before due to GI bleeding  Delirium, cognitive deficits - Delirium precautions - sitter now off -improving, continue seroquel  -plan for SNF, may need long term care w/ Palliative   Failure to thrive in adult BMI 16 Severe protein mass loss. Nutritionist consult -Palliative care consultation: Family would like to pursue SNF for now.  He also states that she would not want a feeding tube and she would like to be DNR.     AKI, prerenal in the setting of poor oral intake Presented with creatinine of 2.42 and BUN of 24 and GFR of 20 Baseline creatinine 0.8 with GFR greater than 60 - Improved with IV fluids -lasix  x1 now   High anion gap metabolic acidosis secondary to acute renal insufficiency -Resolved -Resume home medications   Elevated troponin, suspect demand ischemia No anginal symptoms reported No evidence of acute ischemia on twelve-lead EKG   Generalized weakness PT OT evaluation-SNF placement but patient refused Fall precautions   History of CVA Resume home regimen -High risk of repeat CVA with A-fib not on blood thinner    Hypokalemia Replete   Hypocalcemia - Resolved  corrected serum calcium  9.4   Hypomagnesemia Replete   Questionable UTI - Will treat x 3 days with Rocephin        DVT prophylaxis: add lovenox  Code Status: DNR Family Communication: none present Disposition Plan: SNF tomorrow   Consultants:    Procedures:   Antimicrobials:    Objective: Vitals:   03/08/24 1048 03/08/24 1615 03/08/24 1941 03/09/24 0500  BP: 117/67 109/77 128/80   Pulse: 61  76   Resp: 16 17 16    Temp: (!) 97.3 F (36.3 C)  97.6 F (36.4 C)   TempSrc: Oral  Oral   SpO2: 100%  99%   Weight:    38.2 kg  Height:        Intake/Output Summary (Last 24 hours) at 03/09/2024 0758 Last data filed at 03/08/2024 2300 Gross per 24 hour  Intake 360 ml  Output --  Net 360 ml   Filed Weights   03/07/24 0300 03/08/24 0500 03/09/24 0500  Weight: 38.9 kg 38.9 kg 38.2 kg    Examination:  General exam: AAOx2, cognitive deficits, frail and elderly Respiratory system: few basilar rales Cardiovascular system: S1 & S2 heard, RRR.  Abd: nondistended, soft and nontender.Normal bowel sounds heard. Central nervous system: Alert and oriented x2. No focal neurological deficits. Extremities: no edema Skin: No rashes Psychiatry:  flat affect    Data Reviewed:   CBC: Recent Labs  Lab 03/03/24 1817 03/05/24 0227 03/06/24 0235 03/07/24 0205 03/08/24 1046  WBC 13.5* 11.0* 7.8 6.4 7.7  NEUTROABS 11.7*  --   --   --   --  HGB 15.1* 14.3 13.1 12.1 13.8  HCT 45.0 42.2 38.1 36.5 41.9  MCV 95.9 93.0 93.4 95.3 95.7  PLT 360 349 359 316 394   Basic Metabolic Panel: Recent Labs  Lab 03/03/24 1817 03/04/24 0746 03/05/24 0227 03/06/24 0235 03/07/24 0205 03/08/24 1046  NA 136 140 132* 135 137 142  K 2.4* 2.4* 3.7 3.2* 4.0 4.5  CL 99 114* 105 109 110 112*  CO2 14* 10* 14* 16* 17* 16*  GLUCOSE 84 60* 116* 88 84 83  BUN 34* 25* 27* 25* 27* 21  CREATININE 2.42* 1.45* 1.47* 1.14* 1.15* 1.15*  CALCIUM   7.0* 5.3* 8.2* 7.9* 7.7* 8.2*  MG 1.2* 3.2*  --  1.9 1.7 1.6*  PHOS  --   --   --  2.9 3.1 4.0   GFR: Estimated Creatinine Clearance: 24.7 mL/min (A) (by C-G formula based on SCr of 1.15 mg/dL (H)). Liver Function Tests: Recent Labs  Lab 03/04/24 0746 03/05/24 0227 03/06/24 0235 03/07/24 0205 03/08/24 1046  AST 27 44* 20 21 37  ALT 12 18 14 14 19   ALKPHOS 68 93 72 66 84  BILITOT 0.4 0.4 0.4 <0.2 0.2  PROT 4.6* 6.0* 5.0* 4.9* 5.9*  ALBUMIN 2.1* 2.5* 2.1* 2.1* 2.4*   No results for input(s): LIPASE, AMYLASE in the last 168 hours. No results for input(s): AMMONIA in the last 168 hours. Coagulation Profile: No results for input(s): INR, PROTIME in the last 168 hours. Cardiac Enzymes: No results for input(s): CKTOTAL, CKMB, CKMBINDEX, TROPONINI in the last 168 hours. BNP (last 3 results) Recent Labs    03/03/24 1817  PROBNP 3,427.0*   HbA1C: No results for input(s): HGBA1C in the last 72 hours. CBG: No results for input(s): GLUCAP in the last 168 hours. Lipid Profile: No results for input(s): CHOL, HDL, LDLCALC, TRIG, CHOLHDL, LDLDIRECT in the last 72 hours. Thyroid Function Tests: No results for input(s): TSH, T4TOTAL, FREET4, T3FREE, THYROIDAB in the last 72 hours. Anemia Panel: No results for input(s): VITAMINB12, FOLATE, FERRITIN, TIBC, IRON , RETICCTPCT in the last 72 hours. Urine analysis:    Component Value Date/Time   COLORURINE YELLOW 03/03/2024 2255   APPEARANCEUR HAZY (A) 03/03/2024 2255   LABSPEC 1.025 03/03/2024 2255   PHURINE 6.0 03/03/2024 2255   GLUCOSEU NEGATIVE 03/03/2024 2255   HGBUR SMALL (A) 03/03/2024 2255   BILIRUBINUR MODERATE (A) 03/03/2024 2255   KETONESUR NEGATIVE 03/03/2024 2255   PROTEINUR 100 (A) 03/03/2024 2255   UROBILINOGEN 0.2 02/04/2014 2020   NITRITE NEGATIVE 03/03/2024 2255   LEUKOCYTESUR SMALL (A) 03/03/2024 2255   Sepsis  Labs: @LABRCNTIP (procalcitonin:4,lacticidven:4)  ) Recent Results (from the past 240 hours)  Blood culture (routine x 2)     Status: None   Collection Time: 03/03/24  5:20 PM   Specimen: BLOOD RIGHT ARM  Result Value Ref Range Status   Specimen Description BLOOD RIGHT ARM  Final   Special Requests   Final    BOTTLES DRAWN AEROBIC AND ANAEROBIC Blood Culture results may not be optimal due to an inadequate volume of blood received in culture bottles   Culture   Final    NO GROWTH 5 DAYS Performed at [facility], [address] [place], [place] [address]    Report Status 03/08/2024 FINAL  Final  Blood culture (routine x 2)     Status: None   Collection Time: 03/03/24  6:17 PM   Specimen: BLOOD  Result Value Ref Range Status   Specimen Description BLOOD RIGHT  ANTECUBITAL  Final   Special Requests   Final    BOTTLES DRAWN AEROBIC ONLY Blood Culture adequate volume   Culture   Final    NO GROWTH 5 DAYS Performed at [facility], [address] [place], [place] [address]    Report Status 03/08/2024 FINAL  Final  Urine Culture     Status: Abnormal   Collection Time: 03/03/24 10:55 PM   Specimen: Urine, Clean Catch  Result Value Ref Range Status   Specimen Description URINE, CLEAN CATCH  Final   Special Requests   Final    NONE Performed at [facility], [address] [place], [place] [address]    Culture >=100,000 COLONIES/mL MARINA MCCUNE (A)  Final   Report Status 03/06/2024 FINAL  Final   Organism ID, Bacteria CITROBACTER YOUNGAE (A)  Final      Susceptibility   Citrobacter youngae - MIC*    CEFEPIME  <=0.12 SENSITIVE Sensitive     ERTAPENEM <=0.12 SENSITIVE Sensitive     CEFTRIAXONE  <=0.25 SENSITIVE Sensitive     CIPROFLOXACIN <=0.06 SENSITIVE Sensitive     GENTAMICIN <=1 SENSITIVE Sensitive     NITROFURANTOIN <=16 SENSITIVE Sensitive     TRIMETH/SULFA <=20 SENSITIVE Sensitive     PIP/TAZO Value in next row Sensitive      <=4 SENSITIVEThis is  a modified FDA-approved test that has been validated and its performance characteristics determined by the reporting laboratory.  This laboratory is certified under the Clinical Laboratory Improvement Amendments CLIA as qualified to perform high complexity clinical laboratory testing.    MEROPENEM Value in next row Sensitive      <=4 SENSITIVEThis is a modified FDA-approved test that has been validated and its performance characteristics determined by the reporting laboratory.  This laboratory is certified under the Clinical Laboratory Improvement Amendments CLIA as qualified to perform high complexity clinical laboratory testing.    * >=100,000 COLONIES/mL CITROBACTER YOUNGAE     Radiology Studies: No results found.   Scheduled Meds:  B-complex with vitamin C  1 tablet Oral Daily   diclofenac  Sodium  2 g Topical QID   diltiazem   120 mg Oral Daily   feeding supplement  237 mL Oral BID BM   magnesium  oxide  400 mg Oral Daily   metoprolol  succinate  50 mg Oral Daily   multivitamin with minerals  1 tablet Oral Daily   pantoprazole   40 mg Oral BID   QUEtiapine   25 mg Oral Q2000   QUEtiapine   50 mg Oral QHS   senna-docusate  2 tablet Oral QHS   sodium bicarbonate   650 mg Oral BID   thiamine   100 mg Oral Daily   Continuous Infusions:   LOS: 6 days    Time spent:    Sigurd Pac, MD [facility]   03/09/2024, 7:58 AM         "

## 2024-03-10 ENCOUNTER — Other Ambulatory Visit (HOSPITAL_COMMUNITY): Payer: Self-pay

## 2024-03-10 DIAGNOSIS — E43 Unspecified severe protein-calorie malnutrition: Secondary | ICD-10-CM | POA: Insufficient documentation

## 2024-03-10 DIAGNOSIS — R627 Adult failure to thrive: Secondary | ICD-10-CM | POA: Diagnosis not present

## 2024-03-10 LAB — BASIC METABOLIC PANEL WITH GFR
Anion gap: 11 (ref 5–15)
BUN: 21 mg/dL (ref 8–23)
CO2: 21 mmol/L — ABNORMAL LOW (ref 22–32)
Calcium: 8.1 mg/dL — ABNORMAL LOW (ref 8.9–10.3)
Chloride: 112 mmol/L — ABNORMAL HIGH (ref 98–111)
Creatinine, Ser: 1.35 mg/dL — ABNORMAL HIGH (ref 0.44–1.00)
GFR, Estimated: 40 mL/min — ABNORMAL LOW
Glucose, Bld: 101 mg/dL — ABNORMAL HIGH (ref 70–99)
Potassium: 3.2 mmol/L — ABNORMAL LOW (ref 3.5–5.1)
Sodium: 145 mmol/L (ref 135–145)

## 2024-03-10 LAB — MAGNESIUM: Magnesium: 1.7 mg/dL (ref 1.7–2.4)

## 2024-03-10 MED ORDER — ENOXAPARIN SODIUM 300 MG/3ML IJ SOLN
20.0000 mg | INTRAMUSCULAR | Status: DC
  Administered 2024-03-10 – 2024-03-14 (×5): 20 mg via SUBCUTANEOUS
  Filled 2024-03-10 (×6): qty 0.2

## 2024-03-10 MED ORDER — POTASSIUM CHLORIDE CRYS ER 20 MEQ PO TBCR
40.0000 meq | EXTENDED_RELEASE_TABLET | Freq: Two times a day (BID) | ORAL | Status: AC
  Administered 2024-03-10 (×2): 40 meq via ORAL
  Filled 2024-03-10 (×2): qty 2

## 2024-03-10 MED ORDER — MAGNESIUM SULFATE 2 GM/50ML IV SOLN
2.0000 g | Freq: Once | INTRAVENOUS | Status: AC
  Administered 2024-03-10: 2 g via INTRAVENOUS
  Filled 2024-03-10: qty 50

## 2024-03-10 MED ORDER — METOPROLOL SUCCINATE ER 25 MG PO TB24
25.0000 mg | ORAL_TABLET | Freq: Every day | ORAL | Status: DC
  Administered 2024-03-12 – 2024-03-15 (×4): 25 mg via ORAL
  Filled 2024-03-10 (×5): qty 1

## 2024-03-10 NOTE — TOC Progression Note (Signed)
 Transition of Care [facility]) - Progression Note    Patient Details  Name: Monica Conway MRN: [id number] Date of Birth: December 09, 1945  Transition of Care [facility]) CM/SW Contact  Gwenn Frieze [place], [place] Phone Number: 03/10/2024, 9:55 AM  Clinical Narrative: Home and Community/[employer] auth remains pending for [facility]. Will provide updates as available.   Frieze Gwenn, MSW, LCSW [phone] (coverage)        Expected Discharge Plan: Skilled Nursing Facility Barriers to Discharge: Continued Medical Work up, SNF Pending bed offer, Insurance Authorization               Expected Discharge Plan and Services In-house Referral: Clinical Social Work                                             Social Drivers of Health (SDOH) Interventions SDOH Screenings   Food Insecurity: No Food Insecurity (03/05/2024)  Housing: Low Risk (03/05/2024)  Transportation Needs: Unmet Transportation Needs (03/05/2024)  Utilities: Not At Risk (03/05/2024)  Social Connections: Patient Declined (03/06/2024)  Tobacco Use: Low Risk (12/20/2023)    Readmission Risk Interventions    10/26/2023    1:57 PM 04/06/2023    8:44 AM  Readmission Risk Prevention Plan  Post Dischage Appt Complete   Medication Screening Complete   Transportation Screening Complete Complete  PCP or Specialist Appt within 3-5 Days  Complete  HRI or Home Care Consult  Complete  Social Work Consult for Recovery Care Planning/Counseling  Complete  Palliative Care Screening  Not Applicable  Medication Review Oceanographer)  Complete

## 2024-03-10 NOTE — TOC Progression Note (Addendum)
 Transition of Care [facility]) - Progression Note    Patient Details  Name: Monica Conway MRN: [id number] Date of Birth: 1946/01/07  Transition of Care [facility]) CM/SW Contact  Waddell Barnie Rama, RN Phone Number: 03/10/2024, 11:07 AM  Clinical Narrative:    Per MD patient said she wants to go home.  NCM spoke with patient, asked what hours does she have an aide, she states to call [facility] and check with them.  This NCM called [facility] and spoke with [facility], she states patient has an aide from 9 am to 12 pm , then from 3:30 to 6:30PM, so she just has 6 hours right now.  Shelia informed patient that she will need to increase her hours and would that be ok with her, just trying to have a safe dc plan.  Patient states yes, [facility] will work out the hours for 9 am to 8 pm which will cover the gap she has now, also it was explained to patient that this would increase her pmt to 725.00 more a month, patient is ok with this.  Patient informed Holli with [facility] to contact her brother [name] also to let him know what she will be doing.  Patient states she wants to go home and not to SNF.  Shelia with [facility] also asked if her medications could be streamline better, she gets meds form [facility] on [address] right now and some are mailed from [place].  Holli states it would be better to be bubbled packed.  This NCM will look into for patient.  Also will await call from [facility] to see when they will be able to cover the increased hours. Holli states they will be able to start on Monday 1/19 from 9 am to 8 pm Mon - Friday,  Patient states she has someone to be with her all day on Saturday and Sundays.   Patient states she is interested in streamlining her medications to bubble pack.  NCM contacted Redell at [employer], he states Shonna  helps with that [phone].  Shonna says patient will need to contact her when she is discharged so they can go over the AVS.     Expected Discharge Plan: Skilled Nursing Facility Barriers to Discharge: Continued Medical Work up, SNF Pending bed offer, English As A Second Language Teacher               Expected Discharge Plan and Services In-house Referral: Clinical Social Work                                             Social Drivers of Health (SDOH) Interventions SDOH Screenings   Food Insecurity: No Food Insecurity (03/05/2024)  Housing: Low Risk (03/05/2024)  Transportation Needs: Unmet Transportation Needs (03/05/2024)  Utilities: Not At Risk (03/05/2024)  Social Connections: Patient Declined (03/06/2024)  Tobacco Use: Low Risk (12/20/2023)    Readmission Risk Interventions    10/26/2023    1:57 PM 04/06/2023    8:44 AM  Readmission Risk Prevention Plan  Post Dischage Appt Complete   Medication Screening Complete   Transportation Screening Complete Complete  PCP or Specialist Appt within 3-5 Days  Complete  HRI or Home Care Consult  Complete  Social Work Consult for Recovery Care Planning/Counseling  Complete  Palliative Care Screening  Not Applicable  Medication Review Oceanographer)  Complete

## 2024-03-10 NOTE — Progress Notes (Addendum)
 Occupational Therapy Treatment Patient Details Name: Monica Conway MRN: [id number] DOB: [birth year] Today's Date: 03/10/2024   History of present illness 79 y.o. female presents to [facility] 03/04/23 with failure to thrive, generalized weakness, BLE edema, hypocalcemia and hypokalemia. PMHx: paroxysmal A-fib not on anticoagulation due to history of GI bleed, hypertension, history of CVA, Barrett's esophagus, osteoporosis, bilateral knee osteoarthritis, severe protein calorie malnutrition   OT comments  Pt confused, asking to go into the next room where it is warm. Pt disoriented to place and reports she is in the hospital due to her bad knees. Pt needing max assist for bed level mobility, set up for drinking (minimal interest in eating), total assist for LB dressing and combing her hair. Able to access call button to request nursing staff, needs assistance to problem solve finding a new tv channel. Continue to recommend inpatient follow up therapy, <3 hours/day.       If plan is discharge home, recommend the following:  Two people to help with walking and/or transfers;Two people to help with bathing/dressing/bathroom;Assist for transportation;Direct supervision/assist for financial management;Direct supervision/assist for medications management;Assistance with [facility]   Equipment Recommendations  Hospital bed;Hoyer lift    Recommendations for Other Services      Precautions / Restrictions Precautions Precautions: Fall Recall of Precautions/Restrictions: Impaired Restrictions Weight Bearing Restrictions Per Provider Order: No       Mobility Bed Mobility Overal bed mobility: Needs Assistance Bed Mobility: Rolling, Sidelying to Sit Rolling: Max assist, Used rails Sidelying to sit: Max assist   Sit to supine: Max assist   General bed mobility comments: assist for all aspects, pt grimacing with assist to move LEs, reports B knee pain    Transfers                    General transfer comment: pt declined     Balance                                           ADL either performed or assessed with clinical judgement   ADL Overall ADL's : Needs assistance/impaired Eating/Feeding: Set up;Bed level   Grooming: Brushing hair;Bed level               Lower Body Dressing: Total assistance;Bed level                      Extremity/Trunk Assessment              Vision       Perception     Praxis     Communication Communication Communication: Impaired Factors Affecting Communication: Hearing impaired   Cognition Arousal: Alert Behavior During Therapy: Flat affect (fidgety) Cognition: Cognition impaired   Orientation impairments: Situation, Time, Place Awareness: Intellectual awareness impaired, Online awareness impaired Memory impairment (select all impairments): Short-term memory, Working civil service fast streamer, Engineer, structural memory Attention impairment (select first level of impairment): Sustained attention Executive functioning impairment (select all impairments): Initiation, Organization, Reasoning, Problem solving OT - Cognition Comments: pt repeatedly asking to go into the next room because she was cold, unaware she is in the hospital, aware she is 79 years old                 Following commands: Impaired Following commands impaired: Follows one step commands inconsistently      Cueing   Cueing Techniques: Verbal cues,  Tactile cues, Gestural cues  Exercises      Shoulder Instructions       General Comments      Pertinent Vitals/ Pain       Pain Assessment Pain Assessment: Faces Faces Pain Scale: Hurts little more Pain Location: bilateral knees Pain Descriptors / Indicators: Discomfort, Grimacing, Guarding, Moaning Pain Intervention(s): Monitored during session, Repositioned  Home Living                                          Prior Functioning/Environment               Frequency  Min 2X/week        Progress Toward Goals  OT Goals(current goals can now be found in the care plan section)  Progress towards OT goals: Not progressing toward goals - comment  Acute Rehab OT Goals OT Goal Formulation: Patient unable to participate in goal setting Time For Goal Achievement: 03/19/24 Potential to Achieve Goals: Fair  Plan      Co-evaluation                 AM-PAC OT 6 Clicks Daily Activity     Outcome Measure   Help from another person eating meals?: A Little Help from another person taking care of personal grooming?: A Lot Help from another person toileting, which includes using toliet, bedpan, or urinal?: Total Help from another person bathing (including washing, rinsing, drying)?: A Lot Help from another person to put on and taking off regular upper body clothing?: A Lot Help from another person to put on and taking off regular lower body clothing?: Total 6 Click Score: 11    End of Session    OT Visit Diagnosis: Unsteadiness on feet (R26.81);Other abnormalities of gait and mobility (R26.89);Muscle weakness (generalized) (M62.81);History of falling (Z91.81);Other symptoms and signs involving cognitive function;Pain;Adult, failure to thrive (R62.7)   Activity Tolerance Other (comment) (self limiting)   Patient Left in bed;with call bell/phone within reach;with bed alarm set   Nurse Communication          Time: 8584-8567 OT Time Calculation (min): 17 min  Charges: OT General Charges $OT Visit: 1 Visit OT Treatments $Self Care/Home Management : 8-22 mins  Mliss HERO, OTR/L Acute Rehabilitation Services Office: [phone]   Kennth Mliss Helling 03/10/2024, 2:39 PM

## 2024-03-10 NOTE — Progress Notes (Addendum)
 " PROGRESS NOTE    Monica Conway  FMW:[id number] DOB: [birth year] DOA: 03/03/2024 [facility]: [facility]  79 y.o. female with medical history significant for paroxysmal A-fib not on anticoagulation due to history of GI bleed, hypertension, history of CVA, Barrett's esophagus, osteoporosis, bilateral knee osteoarthritis, severe protein calorie malnutrition, presents to the ER due to failure to thrive in an adult.  Associated with generalized weakness and poor oral intake.    Hospital stay has been complicated by A-fib with RVR and delirium.  Palliative care following and plan is for SNF.  No artificial feeding.   Subjective:   Assessment and Plan:  A-fib with RVR - HR improved, echo this admit w/ preserved EF and mildly low RV -back on home dose of cardizem  and toprol , heart rate now in the 50s will decrease Toprol  dose - Is not a candidate for long-term anticoagulation from before due to GI bleeding  Delirium, cognitive deficits Citrobacter UTI could be contributing - Delirium precautions - sitter now off -improving, continue seroquel  - Initially agreed to SNF, now wants to go home, TOC following, will need to clarify current home care services   Failure to thrive in adult BMI 16 Severe protein mass loss. Nutritionist consult -Palliative care consultation: Family would like to pursue SNF for now.  He also states that she would not want a feeding tube and she would like to be DNR.    AKI, prerenal in the setting of poor oral intake Presented with creatinine of 2.42 and BUN of 24 and GFR of 20 Baseline creatinine 0.8 with GFR greater than 60 - Improved with IV fluids - Given Lasix  X1 yesterday   Hypomagnesemia Hypokalemia Replete   Elevated troponin, suspect demand ischemia No anginal symptoms reported No evidence of acute ischemia on twelve-lead EKG   Generalized weakness PT OT evaluation-SNF placement but patient refused Fall precautions   History of CVA Resume  home regimen -High risk of repeat CVA with A-fib not on blood thinner   Hypokalemia Replete   Hypocalcemia - Resolved  corrected serum calcium  9.4   Hypomagnesemia Replete   Citrobacter UTI -, Completed 3 days of ceftriaxone        DVT prophylaxis:  lovenox  Code Status: DNR Family Communication: none present Disposition Plan: SNF tomorrow   Consultants:    Procedures:   Antimicrobials:    Objective: Vitals:   03/10/24 0005 03/10/24 0644 03/10/24 0740 03/10/24 1105  BP: 108/66 105/68 (!) 92/47 (!) 100/53  Pulse: 62 (!) 52 (!) 49 (!) 58  Resp: 18 18 18 20   Temp: 98 F (36.7 C) 98 F (36.7 C) 97.6 F (36.4 C) (!) 97.4 F (36.3 C)  TempSrc: Oral Oral Oral Oral  SpO2: 96% 97% 97% 98%  Weight:  37.7 kg    Height:        Intake/Output Summary (Last 24 hours) at 03/10/2024 1203 Last data filed at 03/10/2024 0815 Gross per 24 hour  Intake 468.33 ml  Output 300 ml  Net 168.33 ml   Filed Weights   03/08/24 0500 03/09/24 0500 03/10/24 0644  Weight: 38.9 kg 38.2 kg 37.7 kg    Examination:  General exam: AAOx2, cognitive deficits, frail and elderly Respiratory system: few basilar rales Cardiovascular system: S1 & S2 heard, RRR.  Abd: nondistended, soft and nontender.Normal bowel sounds heard. Central nervous system: Alert and oriented x2. No focal neurological deficits. Extremities: no edema Skin: No rashes Psychiatry:  flat affect    Data Reviewed:   CBC: Recent  Labs  Lab 03/03/24 1817 03/05/24 0227 03/06/24 0235 03/07/24 0205 03/08/24 1046  WBC 13.5* 11.0* 7.8 6.4 7.7  NEUTROABS 11.7*  --   --   --   --   HGB 15.1* 14.3 13.1 12.1 13.8  HCT 45.0 42.2 38.1 36.5 41.9  MCV 95.9 93.0 93.4 95.3 95.7  PLT 360 349 359 316 394   Basic Metabolic Panel: Recent Labs  Lab 03/04/24 0746 03/05/24 0227 03/06/24 0235 03/07/24 0205 03/08/24 1046 03/10/24 0228  NA 140 132* 135 137 142 145  K 2.4* 3.7 3.2* 4.0 4.5 3.2*  CL 114* 105 109 110 112*  112*  CO2 10* 14* 16* 17* 16* 21*  GLUCOSE 60* 116* 88 84 83 101*  BUN 25* 27* 25* 27* 21 21  CREATININE 1.45* 1.47* 1.14* 1.15* 1.15* 1.35*  CALCIUM  5.3* 8.2* 7.9* 7.7* 8.2* 8.1*  MG 3.2*  --  1.9 1.7 1.6* 1.7  PHOS  --   --  2.9 3.1 4.0  --    GFR: Estimated Creatinine Clearance: 20.8 mL/min (A) (by C-G formula based on SCr of 1.35 mg/dL (H)). Liver Function Tests: Recent Labs  Lab 03/04/24 0746 03/05/24 0227 03/06/24 0235 03/07/24 0205 03/08/24 1046  AST 27 44* 20 21 37  ALT 12 18 14 14 19   ALKPHOS 68 93 72 66 84  BILITOT 0.4 0.4 0.4 <0.2 0.2  PROT 4.6* 6.0* 5.0* 4.9* 5.9*  ALBUMIN 2.1* 2.5* 2.1* 2.1* 2.4*   No results for input(s): LIPASE, AMYLASE in the last 168 hours. No results for input(s): AMMONIA in the last 168 hours. Coagulation Profile: No results for input(s): INR, PROTIME in the last 168 hours. Cardiac Enzymes: No results for input(s): CKTOTAL, CKMB, CKMBINDEX, TROPONINI in the last 168 hours. BNP (last 3 results) Recent Labs    03/03/24 1817  PROBNP 3,427.0*   HbA1C: No results for input(s): HGBA1C in the last 72 hours. CBG: No results for input(s): GLUCAP in the last 168 hours. Lipid Profile: No results for input(s): CHOL, HDL, LDLCALC, TRIG, CHOLHDL, LDLDIRECT in the last 72 hours. Thyroid Function Tests: No results for input(s): TSH, T4TOTAL, FREET4, T3FREE, THYROIDAB in the last 72 hours. Anemia Panel: No results for input(s): VITAMINB12, FOLATE, FERRITIN, TIBC, IRON , RETICCTPCT in the last 72 hours. Urine analysis:    Component Value Date/Time   COLORURINE YELLOW 03/03/2024 2255   APPEARANCEUR HAZY (A) 03/03/2024 2255   LABSPEC 1.025 03/03/2024 2255   PHURINE 6.0 03/03/2024 2255   GLUCOSEU NEGATIVE 03/03/2024 2255   HGBUR SMALL (A) 03/03/2024 2255   BILIRUBINUR MODERATE (A) 03/03/2024 2255   KETONESUR NEGATIVE 03/03/2024 2255   PROTEINUR 100 (A) 03/03/2024 2255   UROBILINOGEN 0.2  02/04/2014 2020   NITRITE NEGATIVE 03/03/2024 2255   LEUKOCYTESUR SMALL (A) 03/03/2024 2255   Sepsis Labs: @LABRCNTIP (procalcitonin:4,lacticidven:4)  ) Recent Results (from the past 240 hours)  Blood culture (routine x 2)     Status: None   Collection Time: 03/03/24  5:20 PM   Specimen: BLOOD RIGHT ARM  Result Value Ref Range Status   Specimen Description BLOOD RIGHT ARM  Final   Special Requests   Final    BOTTLES DRAWN AEROBIC AND ANAEROBIC Blood Culture results may not be optimal due to an inadequate volume of blood received in culture bottles   Culture   Final    NO GROWTH 5 DAYS Performed at [facility], [address], [place], [place] [address]    Report Status 03/08/2024 FINAL  Final  Blood culture (routine x 2)     Status: None   Collection Time: 03/03/24  6:17 PM   Specimen: BLOOD  Result Value Ref Range Status   Specimen Description BLOOD RIGHT ANTECUBITAL  Final   Special Requests   Final    BOTTLES DRAWN AEROBIC ONLY Blood Culture adequate volume   Culture   Final    NO GROWTH 5 DAYS Performed at [facility], [address], [place], [place] [address]    Report Status 03/08/2024 FINAL  Final  Urine Culture     Status: Abnormal   Collection Time: 03/03/24 10:55 PM   Specimen: Urine, Clean Catch  Result Value Ref Range Status   Specimen Description URINE, CLEAN CATCH  Final   Special Requests   Final    NONE Performed at [facility], [address] [place], [place] [address]    Culture >=100,000 COLONIES/mL MARINA MCCUNE (A)  Final   Report Status 03/06/2024 FINAL  Final   Organism ID, Bacteria CITROBACTER YOUNGAE (A)  Final      Susceptibility   Citrobacter youngae - MIC*    CEFEPIME  <=0.12 SENSITIVE Sensitive     ERTAPENEM <=0.12 SENSITIVE Sensitive     CEFTRIAXONE  <=0.25 SENSITIVE Sensitive     CIPROFLOXACIN <=0.06 SENSITIVE Sensitive     GENTAMICIN <=1 SENSITIVE Sensitive     NITROFURANTOIN <=16 SENSITIVE Sensitive      TRIMETH/SULFA <=20 SENSITIVE Sensitive     PIP/TAZO Value in next row Sensitive      <=4 SENSITIVEThis is a modified FDA-approved test that has been validated and its performance characteristics determined by the reporting laboratory.  This laboratory is certified under the Clinical Laboratory Improvement Amendments CLIA as qualified to perform high complexity clinical laboratory testing.    MEROPENEM Value in next row Sensitive      <=4 SENSITIVEThis is a modified FDA-approved test that has been validated and its performance characteristics determined by the reporting laboratory.  This laboratory is certified under the Clinical Laboratory Improvement Amendments CLIA as qualified to perform high complexity clinical laboratory testing.    * >=100,000 COLONIES/mL CITROBACTER YOUNGAE     Radiology Studies: No results found.   Scheduled Meds:  B-complex with vitamin C  1 tablet Oral Daily   diclofenac  Sodium  2 g Topical QID   diltiazem   120 mg Oral Daily   feeding supplement  237 mL Oral BID BM   magnesium  oxide  400 mg Oral Daily   metoprolol  succinate  50 mg Oral Daily   multivitamin with minerals  1 tablet Oral Daily   pantoprazole   40 mg Oral BID   potassium chloride   40 mEq Oral BID   QUEtiapine   25 mg Oral Q2000   QUEtiapine   50 mg Oral QHS   senna-docusate  2 tablet Oral QHS   sodium bicarbonate   650 mg Oral BID   thiamine   100 mg Oral Daily   Continuous Infusions:   LOS: 7 days    Time spent:    Sigurd Pac, MD [facility]   03/10/2024, 12:03 PM         "

## 2024-03-11 DIAGNOSIS — R627 Adult failure to thrive: Secondary | ICD-10-CM | POA: Diagnosis not present

## 2024-03-11 LAB — BASIC METABOLIC PANEL WITH GFR
Anion gap: 8 (ref 5–15)
BUN: 19 mg/dL (ref 8–23)
CO2: 20 mmol/L — ABNORMAL LOW (ref 22–32)
Calcium: 7.9 mg/dL — ABNORMAL LOW (ref 8.9–10.3)
Chloride: 112 mmol/L — ABNORMAL HIGH (ref 98–111)
Creatinine, Ser: 1.09 mg/dL — ABNORMAL HIGH (ref 0.44–1.00)
GFR, Estimated: 52 mL/min — ABNORMAL LOW
Glucose, Bld: 90 mg/dL (ref 70–99)
Potassium: 4.7 mmol/L (ref 3.5–5.1)
Sodium: 140 mmol/L (ref 135–145)

## 2024-03-11 LAB — MAGNESIUM: Magnesium: 1.9 mg/dL (ref 1.7–2.4)

## 2024-03-11 MED ORDER — MAGNESIUM SULFATE IN D5W 1-5 GM/100ML-% IV SOLN
1.0000 g | Freq: Once | INTRAVENOUS | Status: AC
  Administered 2024-03-11: 1 g via INTRAVENOUS
  Filled 2024-03-11: qty 100

## 2024-03-11 NOTE — Plan of Care (Signed)
   Problem: Education: Goal: Knowledge of General Education information will improve Description: Including pain rating scale, medication(s)/side effects and non-pharmacologic comfort measures Outcome: Not Progressing   Problem: Health Behavior/Discharge Planning: Goal: Ability to manage health-related needs will improve Outcome: Not Progressing   Problem: Activity: Goal: Risk for activity intolerance will decrease Outcome: Not Progressing   Problem: Coping: Goal: Level of anxiety will decrease Outcome: Not Progressing

## 2024-03-11 NOTE — TOC Progression Note (Addendum)
 Transition of Care [facility]) - Progression Note    Patient Details  Name: Monica Conway MRN: [id number] Date of Birth: Mar 14, 1945  [facility] CM/SW Contact  Monica JONELLE [name], RN Phone Number: 03/11/2024, 10:32 AM  Clinical Narrative:    CM spoke with MSW and the patient was declined for SNF placement.  Patient would like to return home with caregiver services through [facility] that will be available for increased hours starting on Monday.  I called and left a message with the patient's brother at the patient's request so that I could discuss needs for the patient including likely need for hospital bed, hoyer lift, HH PT, OT choice.  I will provide the patient's brother with number to call [facility] to assist with bubble-packing patient's medications in the outpatient setting as well.  The patient's brother will be provided with pharmacy number to contact - [phone].  DME order for hospital bed/hoyer lift provided - to be co-signed by MD.  I will wait to hear from the brother regarding arrangements for delivery to the home if agreed.  03/11/2024 - [facility] called and they are currently active with [facility] services and will continue with patient when she returns home.  HH orders for PT, OT ordered for continued HH.  Plan is for patient to return home with home health services on Tuesday.  03/11/2024 1230 - I met with the patient's brother at the bedside and he states that patient lives alone.  He is aware that patient will need hospital bed and hoyer lift at the home and is agreeable to speak with [employer] deliver the hospital bed and hoyer to the home on Monday since [employer] is unable to deliver beds on the weekend.  Brother is having the patient's furniture moved out of the bedside over the weekend.  I called and left a detailed message with Monica Conway, coordinator with [facility] and asked for start of services for [facility] on Tuesday since hospital bed is  being delivered on Monday.  Patient is only able to afford [facility] from 9 am to 8 pm daily.  Patient will be alone at nighttime  - family is aware.  Brother asked about Medicaid screening for the patient.  I sent email to financial counseling to get this started.  [facility] was notified to add MSW and RN to the current PT and OT services.  Plan is to discharge the patient home on Tuesday by PTAR.  I will contact the brother so he is available to meet the patient at the home.  Expected Discharge Plan: Skilled Nursing Facility Barriers to Discharge: Continued Medical Work up, SNF Pending bed offer, Insurance Authorization               Expected Discharge Plan and Services In-house Referral: Clinical Social Work                                             Social Drivers of Health (SDOH) Interventions SDOH Screenings   Food Insecurity: No Food Insecurity (03/05/2024)  Housing: Low Risk (03/05/2024)  Transportation Needs: Unmet Transportation Needs (03/05/2024)  Utilities: Not At Risk (03/05/2024)  Social Connections: Patient Declined (03/06/2024)  Tobacco Use: Low Risk (12/20/2023)    Readmission Risk Interventions    03/11/2024   10:31 AM 10/26/2023    1:57 PM 04/06/2023  8:44 AM  Readmission Risk Prevention Plan  Post Dischage Appt  Complete   Medication Screening  Complete   Transportation Screening Complete Complete Complete  PCP or Specialist Appt within 3-5 Days   Complete  HRI or Home Care Consult   Complete  Social Work Consult for Recovery Care Planning/Counseling   Complete  Palliative Care Screening   Not Applicable  Medication Review Oceanographer) Complete  Complete  PCP or Specialist appointment within 3-5 days of discharge Complete    HRI or Home Care Consult Complete    SW Recovery Care/Counseling Consult Complete    Palliative Care Screening Complete    Skilled Nursing Facility Complete

## 2024-03-11 NOTE — Progress Notes (Signed)
 Physical Therapy Treatment Patient Details Name: Monica Conway MRN: [id number] DOB: 1945/09/11 Today's Date: 03/11/2024   History of Present Illness 79 y.o. female presents to [facility] 03/04/23 with failure to thrive, generalized weakness, BLE edema, hypocalcemia and hypokalemia. PMHx: paroxysmal A-fib not on anticoagulation due to history of GI bleed, hypertension, history of CVA, Barrett's esophagus, osteoporosis, bilateral knee osteoarthritis, severe protein calorie malnutrition    PT Comments  Patient admitting to fear of falling.  Able to stand and take steps with +2 A for safety.  She c/o dizziness though VSS throughout.  She maintains desire to go home despite noting difficulty with mobility today.  Still feel safest d/c plan is for inpatient rehab (<3 hours/day), though if home recommend HHPT, aide, SW.     If plan is discharge home, recommend the following: Assistance with cooking/housework;Direct supervision/assist for medications management;Direct supervision/assist for financial management;Assist for transportation;A lot of help with bathing/dressing/bathroom;A lot of help with walking and/or transfers   Can travel by private vehicle     No  Equipment Recommendations  Hospital bed;Hoyer lift    Recommendations for Other Services       Precautions / Restrictions Precautions Precautions: Fall Recall of Precautions/Restrictions: Impaired     Mobility  Bed Mobility Overal bed mobility: Needs Assistance Bed Mobility: Rolling, Sidelying to Sit Rolling: Min assist Sidelying to sit: Contact guard assist       General bed mobility comments: greatly increased time, pt asking for lower rail though cues for rolling and A to reach for rail with opposite hand, cues and time for lifting trunk, assist for balance as reaching across, min A initially to scoot to EOB though over time pt able to scoot on her own, assist to don shoes while sitting EOB    Transfers Overall transfer level:  Needs assistance Equipment used: Rolling walker (2 wheels) Transfers: Sit to/from Stand Sit to Stand: Mod assist, +2 safety/equipment           General transfer comment: cues for foot position, lifting assist and increased time for COG over BOS despite cues for leaning forward    Ambulation/Gait Ambulation/Gait assistance: Mod assist, +2 safety/equipment Gait Distance (Feet): 8 Feet Assistive device: Rolling walker (2 wheels) Gait Pattern/deviations: Step-to pattern, Decreased stride length, Trunk flexed       General Gait Details: assist for lateral weight shifts, moving walker, cues for stepping, assist for chair follow   Stairs             Wheelchair Mobility     Tilt Bed    Modified Rankin (Stroke Patients Only)       Balance Overall balance assessment: Needs assistance   Sitting balance-Leahy Scale: Fair Sitting balance - Comments: held rail part of time on EOB   Standing balance support: Bilateral upper extremity supported Standing balance-Leahy Scale: Poor Standing balance comment: super fearful of falling, A for balance with RW in standing                            Communication Communication Communication: Impaired Factors Affecting Communication: Hearing impaired  Cognition Arousal: Alert Behavior During Therapy: Flat affect   PT - Cognitive impairments: No family/caregiver present to determine baseline, Memory, Initiation, Sequencing, Problem solving, Safety/Judgement                       PT - Cognition Comments: fearful of falling, declined rehab, slow processing Following commands: Impaired  Following commands impaired: Follows one step commands with increased time, Only follows one step commands consistently    Cueing Cueing Techniques: Verbal cues  Exercises      General Comments General comments (skin integrity, edema, etc.): VSS despite pt c/o dizziness SpO2 100% on RA, HR 65, BP 132/86 seated EOB       Pertinent Vitals/Pain Pain Assessment Faces Pain Scale: Hurts a little bit Pain Location: generalized Pain Descriptors / Indicators: Discomfort Pain Intervention(s): Monitored during session, Repositioned    Home Living                          Prior Function            PT Goals (current goals can now be found in the care plan section) Progress towards PT goals: Progressing toward goals    Frequency    Min 2X/week      PT Plan      Co-evaluation              AM-PAC PT 6 Clicks Mobility   Outcome Measure  Help needed turning from your back to your side while in a flat bed without using bedrails?: A Lot Help needed moving from lying on your back to sitting on the side of a flat bed without using bedrails?: A Lot Help needed moving to and from a bed to a chair (including a wheelchair)?: A Lot Help needed standing up from a chair using your arms (e.g., wheelchair or bedside chair)?: A Lot Help needed to walk in hospital room?: Total Help needed climbing 3-5 steps with a railing? : Total 6 Click Score: 10    End of Session Equipment Utilized During Treatment: Gait belt Activity Tolerance: Patient tolerated treatment well Patient left: in chair;with call bell/phone within reach;with chair alarm set Nurse Communication: Mobility status PT Visit Diagnosis: Other abnormalities of gait and mobility (R26.89);Muscle weakness (generalized) (M62.81);Adult, failure to thrive (R62.7);History of falling (Z91.81)     Time: 8566-8495 PT Time Calculation (min) (ACUTE ONLY): 31 min  Charges:    $Therapeutic Activity: 23-37 mins PT General Charges $$ ACUTE PT VISIT: 1 Visit                     Micheline Conway, PT Acute Rehabilitation Services Office:[phone] 03/11/2024    Monica Conway 03/11/2024, 5:15 PM

## 2024-03-11 NOTE — Progress Notes (Signed)
" °  °  Durable Medical Equipment  (From admission, onward)           Start     Ordered   03/11/24 1018  For home use only DME Hospital bed  Once       Question Answer Comment  Length of Need 12 Months   Patient has (list medical condition): Atrial fibrillation with RVR, GI bleed, CVA, bilateral knee osteoporosis, FTT, calorie malnutrition   The above medical condition requires: Patient requires the ability to reposition frequently   Head must be elevated greater than: 30 degrees   Bed type Semi-electric   Hoyer Lift Yes   Support Surface: Gel Overlay      03/11/24 1020            "

## 2024-03-11 NOTE — Progress Notes (Signed)
 " PROGRESS NOTE    Monica Conway  FMW:[id number] DOB: 1945-07-30 DOA: 03/03/2024 PCP: [facility]  79 y.o. female with medical history significant for paroxysmal A-fib not on anticoagulation due to history of GI bleed, hypertension, history of CVA, Barrett's esophagus, osteoporosis, bilateral knee osteoarthritis, severe protein calorie malnutrition, presents to the ER due to failure to thrive in an adult.  Associated with generalized weakness and poor oral intake.    Hospital stay has been complicated by A-fib with RVR and delirium.  Palliative care following and plan is for SNF.   - Then declines rehab   Subjective: Feels okay, no events overnight, adamant about not going to rehab  Assessment and Plan:  A-fib with RVR - HR improved, echo this admit w/ preserved EF and mildly low RV -back on home dose of cardizem  and toprol , heart rate now in the 50s will decrease Toprol  dose -Discontinue diltiazem  today, heart rate remains low - Is not a candidate for long-term anticoagulation from before due to recurrent GI bleeding  Delirium, cognitive deficits Citrobacter UTI could be contributing - Delirium precautions - sitter now off -improving, continue seroquel  - Initially agreed to SNF, now wants to go home, TOC following, her home care service is working on increased support in the home setting, plan for DC suggested Monday per TOC team   Failure to thrive in adult BMI 16 Severe protein mass loss. Nutritionist consult -Palliative care consultation: Family would like to pursue SNF for now.  He also states that she would not want a feeding tube and she would like to be DNR. -Now patient has changed her mind and adamant about going home    AKI, prerenal in the setting of poor oral intake Presented with creatinine of 2.42 and BUN of 24 and GFR of 20 Baseline creatinine 0.8 with GFR greater than 60 - Likely cardiorenal, improved - Given Lasix  X1 earlier, euvolemic    Hypomagnesemia Hypokalemia Replete   Elevated troponin, suspect demand ischemia No anginal symptoms reported No evidence of acute ischemia on twelve-lead EKG   Generalized weakness PT OT evaluation-SNF placement but patient refused Fall precautions   History of CVA Resume home regimen -High risk of repeat CVA with A-fib not on blood thinner   Hypokalemia Replete   Hypocalcemia - Resolved  corrected serum calcium  9.4   Hypomagnesemia Replete   Citrobacter UTI -, Completed 3 days of ceftriaxone        DVT prophylaxis:  lovenox  Code Status: DNR Family Communication: none present Disposition Plan: Home when arrangements completed with caregivers, adamantly declines rehab   Consultants:    Procedures:   Antimicrobials:    Objective: Vitals:   03/11/24 0021 03/11/24 0500 03/11/24 0509 03/11/24 0807  BP: 126/81  112/75 126/69  Pulse: (!) 52  (!) 55 (!) 46  Resp: 20  20 18   Temp: 97.8 F (36.6 C)  97.8 F (36.6 C) 98 F (36.7 C)  TempSrc: Oral  Oral   SpO2: 97%  97% 100%  Weight:  41.8 kg    Height:        Intake/Output Summary (Last 24 hours) at 03/11/2024 1051 Last data filed at 03/10/2024 2209 Gross per 24 hour  Intake 290 ml  Output --  Net 290 ml   Filed Weights   03/09/24 0500 03/10/24 0644 03/11/24 0500  Weight: 38.2 kg 37.7 kg 41.8 kg    Examination:  General exam: AAOx2, cognitive deficits, frail and elderly Respiratory system: few basilar rales Cardiovascular system:  S1 & S2 heard, RRR.  Abd: nondistended, soft and nontender.Normal bowel sounds heard. Central nervous system: Alert and oriented x2. No focal neurological deficits. Extremities: no edema Skin: No rashes Psychiatry:  flat affect    Data Reviewed:   CBC: Recent Labs  Lab 03/05/24 0227 03/06/24 0235 03/07/24 0205 03/08/24 1046  WBC 11.0* 7.8 6.4 7.7  HGB 14.3 13.1 12.1 13.8  HCT 42.2 38.1 36.5 41.9  MCV 93.0 93.4 95.3 95.7  PLT 349 359 316 394   Basic  Metabolic Panel: Recent Labs  Lab 03/06/24 0235 03/07/24 0205 03/08/24 1046 03/10/24 0228 03/11/24 0301  NA 135 137 142 145 140  K 3.2* 4.0 4.5 3.2* 4.7  CL 109 110 112* 112* 112*  CO2 16* 17* 16* 21* 20*  GLUCOSE 88 84 83 101* 90  BUN 25* 27* 21 21 19   CREATININE 1.14* 1.15* 1.15* 1.35* 1.09*  CALCIUM  7.9* 7.7* 8.2* 8.1* 7.9*  MG 1.9 1.7 1.6* 1.7 1.9  PHOS 2.9 3.1 4.0  --   --    GFR: Estimated Creatinine Clearance: 28.5 mL/min (A) (by C-G formula based on SCr of 1.09 mg/dL (H)). Liver Function Tests: Recent Labs  Lab 03/05/24 0227 03/06/24 0235 03/07/24 0205 03/08/24 1046  AST 44* 20 21 37  ALT 18 14 14 19   ALKPHOS 93 72 66 84  BILITOT 0.4 0.4 <0.2 0.2  PROT 6.0* 5.0* 4.9* 5.9*  ALBUMIN 2.5* 2.1* 2.1* 2.4*   No results for input(s): LIPASE, AMYLASE in the last 168 hours. No results for input(s): AMMONIA in the last 168 hours. Coagulation Profile: No results for input(s): INR, PROTIME in the last 168 hours. Cardiac Enzymes: No results for input(s): CKTOTAL, CKMB, CKMBINDEX, TROPONINI in the last 168 hours. BNP (last 3 results) Recent Labs    03/03/24 1817  PROBNP 3,427.0*   HbA1C: No results for input(s): HGBA1C in the last 72 hours. CBG: No results for input(s): GLUCAP in the last 168 hours. Lipid Profile: No results for input(s): CHOL, HDL, LDLCALC, TRIG, CHOLHDL, LDLDIRECT in the last 72 hours. Thyroid Function Tests: No results for input(s): TSH, T4TOTAL, FREET4, T3FREE, THYROIDAB in the last 72 hours. Anemia Panel: No results for input(s): VITAMINB12, FOLATE, FERRITIN, TIBC, IRON , RETICCTPCT in the last 72 hours. Urine analysis:    Component Value Date/Time   COLORURINE YELLOW 03/03/2024 2255   APPEARANCEUR HAZY (A) 03/03/2024 2255   LABSPEC 1.025 03/03/2024 2255   PHURINE 6.0 03/03/2024 2255   GLUCOSEU NEGATIVE 03/03/2024 2255   HGBUR SMALL (A) 03/03/2024 2255   BILIRUBINUR MODERATE (A)  03/03/2024 2255   KETONESUR NEGATIVE 03/03/2024 2255   PROTEINUR 100 (A) 03/03/2024 2255   UROBILINOGEN 0.2 02/04/2014 2020   NITRITE NEGATIVE 03/03/2024 2255   LEUKOCYTESUR SMALL (A) 03/03/2024 2255   Sepsis Labs: @LABRCNTIP (procalcitonin:4,lacticidven:4)  ) Recent Results (from the past 240 hours)  Blood culture (routine x 2)     Status: None   Collection Time: 03/03/24  5:20 PM   Specimen: BLOOD RIGHT ARM  Result Value Ref Range Status   Specimen Description BLOOD RIGHT ARM  Final   Special Requests   Final    BOTTLES DRAWN AEROBIC AND ANAEROBIC Blood Culture results may not be optimal due to an inadequate volume of blood received in culture bottles   Culture   Final    NO GROWTH 5 DAYS Performed at [facility], [address] [place], [place] [address]    Report Status 03/08/2024 FINAL  Final  Blood  culture (routine x 2)     Status: None   Collection Time: 03/03/24  6:17 PM   Specimen: BLOOD  Result Value Ref Range Status   Specimen Description BLOOD RIGHT ANTECUBITAL  Final   Special Requests   Final    BOTTLES DRAWN AEROBIC ONLY Blood Culture adequate volume   Culture   Final    NO GROWTH 5 DAYS Performed at [facility], [address] [place], [place] [address]    Report Status 03/08/2024 FINAL  Final  Urine Culture     Status: Abnormal   Collection Time: 03/03/24 10:55 PM   Specimen: Urine, Clean Catch  Result Value Ref Range Status   Specimen Description URINE, CLEAN CATCH  Final   Special Requests   Final    NONE Performed at [facility], [address] [place], [place] [address]    Culture >=100,000 COLONIES/mL Monica Conway (A)  Final   Report Status 03/06/2024 FINAL  Final   Organism ID, Bacteria CITROBACTER YOUNGAE (A)  Final      Susceptibility   Citrobacter youngae - MIC*    CEFEPIME  <=0.12 SENSITIVE Sensitive     ERTAPENEM <=0.12 SENSITIVE Sensitive     CEFTRIAXONE  <=0.25 SENSITIVE Sensitive     CIPROFLOXACIN <=0.06  SENSITIVE Sensitive     GENTAMICIN <=1 SENSITIVE Sensitive     NITROFURANTOIN <=16 SENSITIVE Sensitive     TRIMETH/SULFA <=20 SENSITIVE Sensitive     PIP/TAZO Value in next row Sensitive      <=4 SENSITIVEThis is a modified FDA-approved test that has been validated and its performance characteristics determined by the reporting laboratory.  This laboratory is certified under the Clinical Laboratory Improvement Amendments CLIA as qualified to perform high complexity clinical laboratory testing.    MEROPENEM Value in next row Sensitive      <=4 SENSITIVEThis is a modified FDA-approved test that has been validated and its performance characteristics determined by the reporting laboratory.  This laboratory is certified under the Clinical Laboratory Improvement Amendments CLIA as qualified to perform high complexity clinical laboratory testing.    * >=100,000 COLONIES/mL CITROBACTER YOUNGAE     Radiology Studies: No results found.   Scheduled Meds:  B-complex with vitamin C  1 tablet Oral Daily   diclofenac  Sodium  2 g Topical QID   diltiazem   120 mg Oral Daily   enoxaparin  (LOVENOX ) injection  20 mg Subcutaneous Q24H   feeding supplement  237 mL Oral BID BM   magnesium  oxide  400 mg Oral Daily   metoprolol  succinate  25 mg Oral Daily   multivitamin with minerals  1 tablet Oral Daily   pantoprazole   40 mg Oral BID   QUEtiapine   25 mg Oral Q2000   QUEtiapine   50 mg Oral QHS   senna-docusate  2 tablet Oral QHS   sodium bicarbonate   650 mg Oral BID   thiamine   100 mg Oral Daily   Continuous Infusions:  magnesium  sulfate bolus IVPB       LOS: 8 days    Time spent:    Sigurd Pac, MD [facility]   03/11/2024, 10:51 AM         "

## 2024-03-12 LAB — RENAL FUNCTION PANEL
Albumin: 2.4 g/dL — ABNORMAL LOW (ref 3.5–5.0)
Anion gap: 9 (ref 5–15)
BUN: 15 mg/dL (ref 8–23)
CO2: 20 mmol/L — ABNORMAL LOW (ref 22–32)
Calcium: 8.3 mg/dL — ABNORMAL LOW (ref 8.9–10.3)
Chloride: 107 mmol/L (ref 98–111)
Creatinine, Ser: 0.92 mg/dL (ref 0.44–1.00)
GFR, Estimated: >60 mL/min
Glucose, Bld: 84 mg/dL (ref 70–99)
Phosphorus: 2.4 mg/dL — ABNORMAL LOW (ref 2.5–4.6)
Potassium: 4.5 mmol/L (ref 3.5–5.1)
Sodium: 136 mmol/L (ref 135–145)

## 2024-03-12 LAB — MAGNESIUM: Magnesium: 1.7 mg/dL (ref 1.7–2.4)

## 2024-03-12 NOTE — Progress Notes (Signed)
 Patient seen and examined, cognitively more stable now, heart rate controlled -Medically stable, she will go home with increased caregiver support on Monday, adamantly declines rehab  Sigurd Pac, MD

## 2024-03-12 NOTE — Plan of Care (Signed)

## 2024-03-13 MED ORDER — HALOPERIDOL LACTATE 5 MG/ML IJ SOLN
3.0000 mg | Freq: Once | INTRAMUSCULAR | Status: AC | PRN
  Administered 2024-03-13: 3 mg via INTRAVENOUS
  Filled 2024-03-13: qty 1

## 2024-03-13 NOTE — Progress Notes (Signed)
 Patient seen, no events overnight, remains medically stable, plan for discharge home with increased caregiver support tomorrow, continues to adamantly decline rehab, cognitively a bit clearer now  Sigurd Pac, MD

## 2024-03-13 NOTE — Plan of Care (Signed)

## 2024-03-14 ENCOUNTER — Encounter (HOSPITAL_COMMUNITY): Payer: Self-pay | Admitting: Internal Medicine

## 2024-03-14 DIAGNOSIS — R627 Adult failure to thrive: Secondary | ICD-10-CM | POA: Diagnosis not present

## 2024-03-14 MED ORDER — DILTIAZEM HCL ER COATED BEADS 120 MG PO CP24: 120.0000 mg | ORAL_CAPSULE | Freq: Every day | ORAL | 0 refills | Status: DC

## 2024-03-14 MED ORDER — DILTIAZEM HCL ER COATED BEADS 120 MG PO CP24
120.0000 mg | ORAL_CAPSULE | Freq: Every day | ORAL | Status: DC
  Administered 2024-03-15: 120 mg via ORAL
  Filled 2024-03-14 (×3): qty 1

## 2024-03-14 MED ORDER — HALOPERIDOL LACTATE 5 MG/ML IJ SOLN
3.0000 mg | Freq: Once | INTRAMUSCULAR | Status: AC
  Administered 2024-03-14: 3 mg via INTRAVENOUS
  Filled 2024-03-14: qty 1

## 2024-03-14 MED ORDER — METOPROLOL SUCCINATE ER 25 MG PO TB24: 25.0000 mg | ORAL_TABLET | Freq: Every day | ORAL | 0 refills | Status: DC

## 2024-03-14 NOTE — Plan of Care (Signed)

## 2024-03-14 NOTE — TOC Progression Note (Addendum)
 Transition of Care [facility]) - Progression Note    Patient Details  Name: Monica Conway MRN: [id number] Date of Birth: Apr 24, 1945  Transition of Care [facility] CM/SW Contact  Rosaline JONELLE [name], RN Phone Number: 03/14/2024, 10:08 AM  Clinical Narrative:    CM spoke with the attending MD and patient will not have personal care services available at the home until tomorrow.  Patient lives alone and has hired personal care services through [facility] from 9 am to 8 pm daily.    [employer] has delivered the hospital bed to the home today at 9:30 am.  I called and reached out to [place], CM with [employer] to confirm start of services tomorrow.  I left a message with [employer] as well to request hospital sheets be purchased as well.  The patient's brother states that the patient has a key to the home and [facility] can be arranged tomorrow for transport to the home.  I spoke with the brother and he has spoken with [facility] and they will be available tomorrow to provide personal care services at the home.  The brother called [facility] and left detailed voicemail to arrange blister packs for the home medications that will be arranged in the outpatient setting.  The patient's medications are in pill organizers in the home at this time.  [facility] will be arranged for transport to home tomorrow and brother is aware.   Expected Discharge Plan: Skilled Nursing Facility Barriers to Discharge: Continued Medical Work up, SNF Pending bed offer, Insurance Authorization               Expected Discharge Plan and Services In-house Referral: Clinical Social Work       Expected Discharge Date: 03/14/24                                     Social Drivers of Health (SDOH) Interventions SDOH Screenings   Food Insecurity: No Food Insecurity (03/05/2024)  Housing: Low Risk (03/05/2024)  Transportation Needs: Unmet Transportation Needs (03/05/2024)  Utilities: Not At Risk (03/05/2024)   Social Connections: Patient Declined (03/06/2024)  Tobacco Use: Low Risk (12/20/2023)    Readmission Risk Interventions    03/11/2024   10:31 AM 10/26/2023    1:57 PM 04/06/2023    8:44 AM  Readmission Risk Prevention Plan  Post Dischage Appt  Complete   Medication Screening  Complete   Transportation Screening Complete Complete Complete  PCP or Specialist Appt within 3-5 Days   Complete  HRI or Home Care Consult   Complete  Social Work Consult for Recovery Care Planning/Counseling   Complete  Palliative Care Screening   Not Applicable  Medication Review Ocean[employer]) Complete  Complete  PCP or Specialist appointment within 3-5 days of discharge Complete    HRI or Home Care Consult Complete    SW Recovery Care/Counseling Consult Complete    Palliative Care Screening Complete    Skilled Nursing Facility Complete

## 2024-03-14 NOTE — Discharge Summary (Addendum)
 Physician Discharge Summary  Monica Conway FMW:[id number] DOB: 1945/06/16 DOA: 03/03/2024  PCP: [facility]  Admit date: 03/03/2024 Discharge date: 03/14/2024  Time spent:45 minutes  Recommendations for Outpatient Follow-up:  Home health services PCP in 1 week Outpatient palliative care   Discharge Diagnoses:  Principal Problem:   Failure to thrive in adult Delirium Cognitive deficits A-fib RVR   Protein-calorie malnutrition, severe AKI/CKD 3a History of CVA  Discharge Condition: Improved  Diet recommendation: Heart healthy  Filed Weights   03/11/24 0500 03/13/24 0500 03/14/24 0500  Weight: 41.8 kg 41.8 kg 41.8 kg    History of present illness:  79 y.o. female with medical history significant for paroxysmal A-fib not on anticoagulation due to history of GI bleed, hypertension, history of CVA, Barrett's esophagus, osteoporosis, bilateral knee osteoarthritis, severe protein calorie malnutrition, presents to the ER due to failure to thrive in an adult.  Associated with generalized weakness and poor oral intake.    Hospital stay has been complicated by A-fib with RVR and delirium.  Palliative care following and plan is for SNF.   - Then adamantly declined rehab - Family working on additional home care services/support  Hospital Course:   A-fib with RVR - HR improved, echo this admit w/ preserved EF and mildly low RV -back on home dose of cardizem  and toprol , heart rate then in the 50s , decreased Toprol  dose - Diltiazem  dose decreased - Is not a candidate for long-term anticoagulation from before due to recurrent GI bleeding   Delirium, cognitive deficits Citrobacter UTI could be contributing - Delirium precautions - sitter now off, completed Abx for UTI -  improving, continue seroquel  - Initially agreed to SNF, now wants to go home, TOC following, her home care service is working on increased support in the home setting, plan for DC suggested Monday per TOC  team   Failure to thrive in adult BMI 16 Severe protein mass loss. Nutritionist consult -Palliative care consultation: Family would like to pursue SNF for now.  He also states that she would not want a feeding tube and she would like to be DNR. -Now patient has changed her mind and adamant about going home -Outpatient palliative care referral sent    AKI, prerenal in the setting of poor oral intake Presented with creatinine of 2.42 and BUN of 24 and GFR of 20 Baseline creatinine 0.8 with GFR greater than 60 - Likely cardiorenal, improved - Given Lasix  X1 earlier, has remained euvolemic since then   Hypomagnesemia Hypokalemia Replete   Elevated troponin, suspect demand ischemia No anginal symptoms reported No evidence of acute ischemia on twelve-lead EKG   Generalized weakness PT OT evaluation-SNF placement but patient refused Fall precautions   History of CVA Resume home regimen -High risk of repeat CVA with A-fib not on blood thinner   Hypokalemia Replete   Hypocalcemia - Resolved  corrected serum calcium  9.4   Hypomagnesemia Replete   Citrobacter UTI -, Completed 3 days of ceftriaxone   Discharge Exam: Vitals:   03/13/24 2108 03/14/24 0828  BP: 128/82 124/69  Pulse: 74 71  Resp: 16 16  Temp: 98.6 F (37 C) 98.7 F (37.1 C)  SpO2: 95% 96%   General exam: AAOx2, cognitive deficits, frail and elderly Respiratory system: clear Cardiovascular system: S1 & S2 heard, RRR.  Abd: nondistended, soft and nontender.Normal bowel sounds heard. Central nervous system: Alert and oriented x2. No focal neurological deficits. Extremities: no edema Skin: No rashes Psychiatry:  flat affect  Discharge Instructions  Discharge Instructions     Diet - low sodium heart healthy   Complete by: As directed    Increase activity slowly   Complete by: As directed       Allergies as of 03/14/2024       Reactions   Pine Shortness Of Breath   Pollen Extract Shortness Of  Breath   Caduet [amlodipine -atorvastatin] Other (See Comments)   Unknown reaction        Medication List     TAKE these medications    acetaminophen  500 MG tablet Commonly known as: TYLENOL  Take 2 tablets (1,000 mg total) by mouth every 8 (eight) hours.   albuterol  108 (90 Base) MCG/ACT inhaler Commonly known as: VENTOLIN  HFA Inhale 2 puffs into the lungs every 6 (six) hours as needed for wheezing or shortness of breath.   diltiazem  120 MG 24 hr capsule Commonly known as: CARDIZEM  CD Take 1 capsule (120 mg total) by mouth daily. What changed:  medication strength how much to take   feeding supplement Liqd Take 237 mLs by mouth 2 (two) times daily between meals.   iron  polysaccharides 150 MG capsule Commonly known as: Nu-Iron  Take 1 capsule (150 mg total) by mouth daily.   lidocaine  5 % Commonly known as: LIDODERM  Place 1 patch onto the skin daily. Remove & Discard patch within 12 hours or as directed by MD   loperamide  2 MG capsule Commonly known as: IMODIUM  Take 1 capsule (2 mg total) by mouth daily as needed for diarrhea or loose stools.   magnesium  oxide 400 (240 Mg) MG tablet Commonly known as: MAG-OX Take 1 tablet (400 mg total) by mouth daily.   melatonin 5 MG Tabs Take 1 tablet (5 mg total) by mouth at bedtime.   metoprolol  succinate 25 MG 24 hr tablet Commonly known as: TOPROL -XL Take 1 tablet (25 mg total) by mouth daily. Take with or immediately following a meal. What changed:  medication strength how much to take   Myrbetriq  25 MG Tb24 tablet Generic drug: mirabegron  ER Take 25 mg by mouth daily. What changed: Another medication with the same name was removed. Continue taking this medication, and follow the directions you see here.   oxyCODONE  5 MG immediate release tablet Commonly known as: Oxy IR/ROXICODONE  Take 2.5-5 mg by mouth 2 (two) times daily as needed for severe pain (pain score 7-10).   pantoprazole  40 MG tablet Commonly known  as: PROTONIX  Take 1 tablet (40 mg total) by mouth 2 (two) times daily.   potassium chloride  SA 20 MEQ tablet Commonly known as: KLOR-CON  M Take 1 tablet (20 mEq total) by mouth daily.   QUEtiapine  25 MG tablet Commonly known as: SEROQUEL  Take 1 tablet (25 mg total) by mouth daily at 8 pm.   QUEtiapine  50 MG tablet Commonly known as: SEROQUEL  Take 1 tablet (50 mg total) by mouth at bedtime.   sodium bicarbonate  650 MG tablet Take 1 tablet (650 mg total) by mouth 2 (two) times daily.   traMADol  50 MG tablet Commonly known as: ULTRAM  Take 1 tablet (50 mg total) by mouth every 6 (six) hours as needed for moderate pain (pain score 4-6).               Durable Medical Equipment  (From admission, onward)           Start     Ordered   03/11/24 1018  For home use only DME Hospital bed  Once  Question Answer Comment  Length of Need 12 Months   Patient has (list medical condition): Atrial fibrillation with RVR, GI bleed, CVA, bilateral knee osteoporosis, FTT, calorie malnutrition   The above medical condition requires: Patient requires the ability to reposition frequently   Head must be elevated greater than: 30 degrees   Bed type Semi-electric   Hoyer Lift Yes   Support Surface: Gel Overlay      03/11/24 1020           Allergies[1]  Contact information for follow-up providers     [facility].   Specialty: Home Health Services Why: [facility] will continue to provide Personal Care Services at the home. Contact information: [address] [place] [address] [phone]              Contact information for after-discharge care     Destination     [facility] Preferred SNF .   Service: Skilled Nursing Contact information: [address] [place]  [phone]             Home Medical Care     [facility]) .   Service: Home  Health Services Contact information: [address] [place]  [phone]                      The results of significant diagnostics from this hospitalization (including imaging, microbiology, ancillary and laboratory) are listed below for reference.    Significant Diagnostic Studies: ECHOCARDIOGRAM COMPLETE Result Date: 03/04/2024    ECHOCARDIOGRAM REPORT   Patient Name:   Monica Conway Date of Exam: 03/04/2024 Medical Rec #:  [id number]         Height:       62.0 in Accession #:    [id number]        Weight:       91.3 lb Date of Birth:  [birth year]         BSA:          1.370 m Patient Age:    78 years          BP:           154/95 mmHg Patient Gender: F                 HR:           56 bpm. Exam Location:  Inpatient Procedure: 2D Echo, Cardiac Doppler and Color Doppler (Both Spectral and Color            Flow Doppler were utilized during procedure). Indications:    Elevated troponin  History:        Patient has prior history of Echocardiogram examinations, most                 recent 01/25/2023. Arrythmias:Atrial Fibrillation; Risk                 Factors:Hypertension.  Sonographer:    Merlynn Argyle Referring Phys: [id number] CAROLE N HALL  Sonographer Comments: Technically difficult study due to poor echo windows. Image acquisition challenging due to uncooperative patient and Image acquisition challenging due to respiratory motion. IMPRESSIONS  1. Left ventricular ejection fraction, by estimation, is 55 to 60%. The left ventricle has normal function. The left ventricle has no regional wall motion abnormalities. Left ventricular diastolic parameters were normal.  2. Right ventricular systolic function is low  normal. The right ventricular size is mildly enlarged. Tricuspid regurgitation signal is inadequate for assessing PA pressure.  3. The mitral valve is degenerative. No evidence of mitral valve regurgitation. No evidence of mitral stenosis.  4. The  aortic valve is calcified. Aortic valve regurgitation is not visualized. Mild aortic valve stenosis. Aortic valve area, by VTI measures 1.20 cm. Aortic valve mean gradient measures 9.0 mmHg. Aortic valve Vmax measures 2.28 m/s. Comparison(s): A prior study was performed on 01/25/2023. There was grade I diastolic dysfunction with similar systolic function and mild to moderate aortic stenosis with an AV Vmax 2.8 m/s. FINDINGS  Left Ventricle: Left ventricular ejection fraction, by estimation, is 55 to 60%. The left ventricle has normal function. The left ventricle has no regional wall motion abnormalities. The left ventricular internal cavity size was normal in size. There is  no left ventricular hypertrophy. Left ventricular diastolic parameters were normal. Right Ventricle: The right ventricular size is mildly enlarged. No increase in right ventricular wall thickness. Right ventricular systolic function is low normal. Tricuspid regurgitation signal is inadequate for assessing PA pressure. Left Atrium: Left atrial size was normal in size. Right Atrium: Right atrial size was normal in size. Pericardium: There is no evidence of pericardial effusion. Mitral Valve: The mitral valve is degenerative in appearance. Mild mitral annular calcification. No evidence of mitral valve regurgitation. No evidence of mitral valve stenosis. Tricuspid Valve: The tricuspid valve is normal in structure. Tricuspid valve regurgitation is not demonstrated. No evidence of tricuspid stenosis. Aortic Valve: The aortic valve is calcified. Aortic valve regurgitation is not visualized. Mild aortic stenosis is present. Aortic valve mean gradient measures 9.0 mmHg. Aortic valve peak gradient measures 20.8 mmHg. Aortic valve area, by VTI measures 1.20 cm. Pulmonic Valve: The pulmonic valve was normal in structure. Pulmonic valve regurgitation is not visualized. No evidence of pulmonic stenosis. Aorta: The aortic root is normal in size and  structure. Venous: The inferior vena cava was not well visualized. IAS/Shunts: No atrial level shunt detected by color flow Doppler.  LEFT VENTRICLE PLAX 2D LVOT diam:     1.70 cm   Diastology LV SV:         47        LV e' medial:    7.07 cm/s LV SV Index:   34        LV E/e' medial:  10.6 LVOT Area:     2.27 cm  LV e' lateral:   8.70 cm/s                          LV E/e' lateral: 8.6  RIGHT VENTRICLE RV Basal diam:  3.00 cm RV S prime:     12.20 cm/s TAPSE (M-mode): 1.6 cm LEFT ATRIUM             Index        RIGHT ATRIUM          Index LA Vol (A2C):   12.5 ml 9.13 ml/m   RA Area:     7.59 cm LA Vol (A4C):   13.1 ml 9.56 ml/m   RA Volume:   13.80 ml 10.08 ml/m LA Biplane Vol: 13.9 ml 10.15 ml/m  AORTIC VALVE AV Area (Vmax):    0.85 cm AV Area (Vmean):   0.86 cm AV Area (VTI):     1.20 cm AV Vmax:           228.00 cm/s AV Vmean:  132.000 cm/s AV VTI:            0.394 m AV Peak Grad:      20.8 mmHg AV Mean Grad:      9.0 mmHg LVOT Vmax:         85.40 cm/s LVOT Vmean:        50.000 cm/s LVOT VTI:          0.208 m LVOT/AV VTI ratio: 0.53  AORTA Ao Root diam: 3.00 cm MITRAL VALVE MV Area (PHT): 2.90 cm    SHUNTS MV Decel Time: 262 msec    Systemic VTI:  0.21 m MV E velocity: 75.20 cm/s  Systemic Diam: 1.70 cm MV A velocity: 83.60 cm/s MV E/A ratio:  0.90 Emeline Calender Electronically signed by Emeline Calender Signature Date/Time: 03/04/2024/6:03:09 PM    Final    DG Chest Portable 1 View Result Date: 03/03/2024 EXAM: 1 VIEW(S) XRAY OF THE CHEST 03/03/2024 08:13:00 PM COMPARISON: 12/18/2023 CLINICAL HISTORY: sepsis FINDINGS: LUNGS AND PLEURA: No focal pulmonary opacity. No pleural effusion. No pneumothorax. HEART AND MEDIASTINUM: Aortic arch calcifications. BONES AND SOFT TISSUES: Large hiatal hernia. No acute osseous abnormality. IMPRESSION: 1. No acute cardiopulmonary abnormality. Electronically signed by: Pinkie Pebbles MD MD 03/03/2024 08:22 PM EST RP Workstation: [id number]   CT Head Wo  Contrast Result Date: 03/03/2024 CLINICAL DATA:  Altered mental status EXAM: CT HEAD WITHOUT CONTRAST TECHNIQUE: Contiguous axial images were obtained from the base of the skull through the vertex without intravenous contrast. RADIATION DOSE REDUCTION: This exam was performed according to the departmental dose-optimization program which includes automated exposure control, adjustment of the mA and/or kV according to patient size and/or use of iterative reconstruction technique. COMPARISON:  Head CT 12/18/2023, MRI 01/24/2023 FINDINGS: Brain: Limited by scan technique and patient positioning. Allowing for this, no gross territorial infarct, hemorrhage or intracranial mass is seen. Chronic infarct in the right basal ganglia. Chronic right occipital infarct. Chronic small vessel ischemic changes of the white matter. No ventricular enlargement is seen Vascular: No unexpected calcification Skull: No obvious fracture Sinuses/Orbits: No acute finding Other: None IMPRESSION: 1. Limited exam due to scan technique and patient positioning. Allowing for this, no gross acute intracranial abnormality is seen. 2. Chronic infarcts in the right basal ganglia and right occipital lobe. Chronic small vessel ischemic changes of the white matter. Electronically Signed   By: Luke Bun M.D.   On: 03/03/2024 17:48    Microbiology: No results found for this or any previous visit (from the past 240 hours).   Labs: Basic Metabolic Panel: Recent Labs  Lab 03/08/24 1046 03/10/24 0228 03/11/24 0301 03/12/24 0534  NA 142 145 140 136  K 4.5 3.2* 4.7 4.5  CL 112* 112* 112* 107  CO2 16* 21* 20* 20*  GLUCOSE 83 101* 90 84  BUN 21 21 19 15   CREATININE 1.15* 1.35* 1.09* 0.92  CALCIUM  8.2* 8.1* 7.9* 8.3*  MG 1.6* 1.7 1.9 1.7  PHOS 4.0  --   --  2.4*   Liver Function Tests: Recent Labs  Lab 03/08/24 1046 03/12/24 0534  AST 37  --   ALT 19  --   ALKPHOS 84  --   BILITOT 0.2  --   PROT 5.9*  --   ALBUMIN 2.4* 2.4*    No results for input(s): LIPASE, AMYLASE in the last 168 hours. No results for input(s): AMMONIA in the last 168 hours. CBC: Recent Labs  Lab 03/08/24 1046  WBC 7.7  HGB 13.8  HCT 41.9  MCV 95.7  PLT 394   Cardiac Enzymes: No results for input(s): CKTOTAL, CKMB, CKMBINDEX, TROPONINI in the last 168 hours. BNP: BNP (last 3 results) No results for input(s): BNP in the last 8760 hours.  ProBNP (last 3 results) Recent Labs    03/03/24 1817  PROBNP 3,427.0*    CBG: No results for input(s): GLUCAP in the last 168 hours.     Signed:  Sigurd Pac MD.  [facility] 03/14/2024, 9:32 AM        [1]  Allergies Allergen Reactions   Pine Shortness Of Breath   Pollen Extract Shortness Of Breath   Caduet [Amlodipine -Atorvastatin] Other (See Comments)    Unknown reaction

## 2024-03-14 NOTE — Plan of Care (Signed)
  Problem: Clinical Measurements: Goal: Ability to maintain clinical measurements within normal limits will improve Outcome: Progressing Goal: Will remain free from infection Outcome: Progressing Goal: Diagnostic test results will improve Outcome: Progressing Goal: Respiratory complications will improve Outcome: Progressing Goal: Cardiovascular complication will be avoided Outcome: Progressing   Problem: Activity: Goal: Risk for activity intolerance will decrease Outcome: Progressing   Problem: Nutrition: Goal: Adequate nutrition will be maintained Outcome: Progressing   Problem: Coping: Goal: Level of anxiety will decrease Outcome: Progressing   Problem: Elimination: Goal: Will not experience complications related to bowel motility Outcome: Progressing

## 2024-03-15 NOTE — Progress Notes (Signed)
 Patient seen, no changes from my discharge summary yesterday -Arrangements made for increased home care services, discharging home today  Sigurd Pac, MD

## 2024-03-15 NOTE — TOC Progression Note (Signed)
 Transition of Care [facility]) - Progression Note    Patient Details  Name: Monica Conway MRN: [id number] Date of Birth: 21-Oct-1945  [facility] CM/SW Contact  Rosaline JONELLE [name], RN Phone Number: 03/15/2024, 9:02 AM  Clinical Narrative:    CM spoke with attending MD and bedside nursing and patient is able to discharge home today.  I called and spoke with Marylou, CM at [facility] and she has provided a nursing assistant at the home today starting at 10 am today.     The patient's brother was called and he is aware that patient is being discharged home by [facility] at 10 am.  He called and left a message with [facility] pharmacy requesting patient's medications be placed in blister packs in the outpatient setting.  Patient's hospital bed was delivered yesterday to the home per brother.  [facility] was called and transportation is set up for transport to the home at 10 am.  DNR was placed in the transport packet.   Expected Discharge Plan: Skilled Nursing Facility Barriers to Discharge: Continued Medical Work up, SNF Pending bed offer, Insurance Authorization               Expected Discharge Plan and Services In-house Referral: Clinical Social Work       Expected Discharge Date: 03/15/24                                     Social Drivers of Health (SDOH) Interventions SDOH Screenings   Food Insecurity: No Food Insecurity (03/05/2024)  Housing: Low Risk (03/05/2024)  Transportation Needs: Unmet Transportation Needs (03/05/2024)  Utilities: Not At Risk (03/05/2024)  Social Connections: Patient Declined (03/06/2024)  Tobacco Use: Low Risk (03/14/2024)    Readmission Risk Interventions    03/11/2024   10:31 AM 10/26/2023    1:57 PM 04/06/2023    8:44 AM  Readmission Risk Prevention Plan  Post Dischage Appt  Complete   Medication Screening  Complete   Transportation Screening Complete Complete Complete  PCP or Specialist Appt within 3-5 Days   Complete  HRI or  Home Care Consult   Complete  Social Work Consult for Recovery Care Planning/Counseling   Complete  Palliative Care Screening   Not Applicable  Medication Review Oceanographer) Complete  Complete  PCP or Specialist appointment within 3-5 days of discharge Complete    HRI or Home Care Consult Complete    SW Recovery Care/Counseling Consult Complete    Palliative Care Screening Complete    Skilled Nursing Facility Complete

## 2024-03-22 ENCOUNTER — Emergency Department (HOSPITAL_COMMUNITY)

## 2024-03-22 ENCOUNTER — Other Ambulatory Visit: Payer: Self-pay

## 2024-03-22 ENCOUNTER — Inpatient Hospital Stay (HOSPITAL_COMMUNITY)
Admission: EM | Admit: 2024-03-22 | Discharge: 2024-04-01 | Disposition: A | Discharge status: Skilled Nursing Facility | Source: Home / Self Care | Attending: Internal Medicine | Admitting: Internal Medicine

## 2024-03-22 DIAGNOSIS — Z7189 Other specified counseling: Secondary | ICD-10-CM

## 2024-03-22 DIAGNOSIS — I959 Hypotension, unspecified: Secondary | ICD-10-CM

## 2024-03-22 DIAGNOSIS — I5033 Acute on chronic diastolic (congestive) heart failure: Secondary | ICD-10-CM | POA: Diagnosis present

## 2024-03-22 DIAGNOSIS — I480 Paroxysmal atrial fibrillation: Secondary | ICD-10-CM | POA: Diagnosis present

## 2024-03-22 DIAGNOSIS — E8770 Fluid overload, unspecified: Principal | ICD-10-CM

## 2024-03-22 DIAGNOSIS — N3000 Acute cystitis without hematuria: Secondary | ICD-10-CM

## 2024-03-22 LAB — URINALYSIS, W/ REFLEX TO CULTURE (INFECTION SUSPECTED)
Bilirubin Urine: NEGATIVE
Glucose, UA: NEGATIVE mg/dL
Hgb urine dipstick: NEGATIVE
Ketones, ur: NEGATIVE mg/dL
Nitrite: POSITIVE — AB
Protein, ur: 30 mg/dL — AB
Specific Gravity, Urine: 1.017 (ref 1.005–1.030)
pH: 7 (ref 5.0–8.0)

## 2024-03-22 LAB — COMPREHENSIVE METABOLIC PANEL WITH GFR
ALT: 19 U/L (ref 0–44)
AST: 28 U/L (ref 15–41)
Albumin: 3.3 g/dL — ABNORMAL LOW (ref 3.5–5.0)
Alkaline Phosphatase: 108 U/L (ref 38–126)
Anion gap: 12 (ref 5–15)
BUN: 34 mg/dL — ABNORMAL HIGH (ref 8–23)
CO2: 23 mmol/L (ref 22–32)
Calcium: 9.1 mg/dL (ref 8.9–10.3)
Chloride: 105 mmol/L (ref 98–111)
Creatinine, Ser: 1.26 mg/dL — ABNORMAL HIGH (ref 0.44–1.00)
GFR, Estimated: 44 mL/min — ABNORMAL LOW
Glucose, Bld: 86 mg/dL (ref 70–99)
Potassium: 5.1 mmol/L (ref 3.5–5.1)
Sodium: 140 mmol/L (ref 135–145)
Total Bilirubin: 0.4 mg/dL (ref 0.0–1.2)
Total Protein: 7.5 g/dL (ref 6.5–8.1)

## 2024-03-22 LAB — CBC WITH DIFFERENTIAL/PLATELET
Abs Immature Granulocytes: 0.05 10*3/uL (ref 0.00–0.07)
Basophils Absolute: 0.1 10*3/uL (ref 0.0–0.1)
Basophils Relative: 1 %
Eosinophils Absolute: 0.2 10*3/uL (ref 0.0–0.5)
Eosinophils Relative: 2 %
HCT: 41.2 % (ref 36.0–46.0)
Hemoglobin: 13.3 g/dL (ref 12.0–15.0)
Immature Granulocytes: 1 %
Lymphocytes Relative: 14 %
Lymphs Abs: 1.5 10*3/uL (ref 0.7–4.0)
MCH: 31.7 pg (ref 26.0–34.0)
MCHC: 32.3 g/dL (ref 30.0–36.0)
MCV: 98.1 fL (ref 80.0–100.0)
Monocytes Absolute: 0.8 10*3/uL (ref 0.1–1.0)
Monocytes Relative: 8 %
Neutro Abs: 8 10*3/uL — ABNORMAL HIGH (ref 1.7–7.7)
Neutrophils Relative %: 74 %
Platelets: 493 10*3/uL — ABNORMAL HIGH (ref 150–400)
RBC: 4.2 MIL/uL (ref 3.87–5.11)
RDW: 18.2 % — ABNORMAL HIGH (ref 11.5–15.5)
WBC: 10.7 10*3/uL — ABNORMAL HIGH (ref 4.0–10.5)
nRBC: 0 % (ref 0.0–0.2)

## 2024-03-22 LAB — I-STAT CG4 LACTIC ACID, ED: Lactic Acid, Venous: 0.8 mmol/L (ref 0.5–1.9)

## 2024-03-22 LAB — PRO BRAIN NATRIURETIC PEPTIDE: Pro Brain Natriuretic Peptide: 4058 pg/mL — ABNORMAL HIGH

## 2024-03-22 LAB — TSH: TSH: 5.24 u[IU]/mL — ABNORMAL HIGH (ref 0.350–4.500)

## 2024-03-22 MED ORDER — OXYCODONE-ACETAMINOPHEN 5-325 MG PO TABS
1.0000 | ORAL_TABLET | Freq: Once | ORAL | Status: AC
  Administered 2024-03-22: 1 via ORAL
  Filled 2024-03-22: qty 1

## 2024-03-22 MED ORDER — SODIUM CHLORIDE 0.9 % IV SOLN
1.0000 g | Freq: Once | INTRAVENOUS | Status: AC
  Administered 2024-03-22: 1 g via INTRAVENOUS
  Filled 2024-03-22: qty 10

## 2024-03-22 MED ORDER — SODIUM CHLORIDE 0.9 % IV SOLN: 2.0000 g | Freq: Once | INTRAVENOUS | Status: DC

## 2024-03-22 MED ORDER — FUROSEMIDE 10 MG/ML IJ SOLN
20.0000 mg | Freq: Once | INTRAMUSCULAR | Status: AC
  Administered 2024-03-22: 20 mg via INTRAVENOUS
  Filled 2024-03-22: qty 2

## 2024-03-22 NOTE — ED Triage Notes (Signed)
 Pt bib ems from home; a and o x 4; c/o bilateral feet swelling, weeping that started yesterday; hx same; denies cp, sob, n/v; 130 palp, 96 HR, 93% RA

## 2024-03-22 NOTE — ED Provider Notes (Incomplete)
 " [place] [facility] Provider Note   CSN: [id number] Arrival date & time: 03/22/24  1820     Patient presents with: No chief complaint on file.   Monica Conway is a 79 y.o. female with history of E. coli UTI, cognitive impairment, paroxysmal A-fib not anticoagulated, acute GI bleeding, acute on chronic CHF, failure to thrive, hypertension.  Presents to ED complaining of bilateral leg swelling and weeping.  Reports that this was noted yesterday.  Patient recently admitted for heart failure exacerbation with echocardiogram EF of 55 to 60%.  Patient does not take diuretics.  She reports that she has been more short of breath recently especially when she ambulates and perform certain tasks around the house such as vacuuming.  She denies any chest pain, nausea, vomiting or fevers at home.  She denies any falls.  She reports she lives at home and has home health aides come to assist with her medications.  She also complains of pain on her buttocks and reports that she has been sitting more recently.  Patient does endorse dysuria and reports this has been occurring since she was last hospitalized.  She attributes this burning to being wiped too hard upon her last hospitalization.  HPI     Prior to Admission medications  Medication Sig Start Date End Date Taking? Authorizing Provider  acetaminophen  (TYLENOL ) 500 MG tablet Take 2 tablets (1,000 mg total) by mouth every 8 (eight) hours. 12/29/23   Fausto Burnard LABOR, DO  albuterol  (VENTOLIN  HFA) 108 (90 Base) MCG/ACT inhaler Inhale 2 puffs into the lungs every 6 (six) hours as needed for wheezing or shortness of breath.    [provider]  diltiazem  (CARDIZEM  CD) 120 MG 24 hr capsule Take 1 capsule (120 mg total) by mouth daily. 03/14/24   Fairy Frames, MD  feeding supplement (ENSURE PLUS HIGH PROTEIN) LIQD Take 237 mLs by mouth 2 (two) times daily between meals. 12/29/23   Fausto Burnard A, DO  iron   polysaccharides (NU-IRON ) 150 MG capsule Take 1 capsule (150 mg total) by mouth daily. 04/09/23 10/26/23  Laurence Locus, DO  lidocaine  (LIDODERM ) 5 % Place 1 patch onto the skin daily. Remove & Discard patch within 12 hours or as directed by MD 10/28/23   [place], Locus PARAS, DO  loperamide  (IMODIUM ) 2 MG capsule Take 1 capsule (2 mg total) by mouth daily as needed for diarrhea or loose stools. 01/31/23   Jolaine Fairy, DO  magnesium  oxide (MAG-OX) 400 (240 Mg) MG tablet Take 1 tablet (400 mg total) by mouth daily. 12/29/23   Fausto Burnard A, DO  melatonin 5 MG TABS Take 1 tablet (5 mg total) by mouth at bedtime. 01/31/23   Jolaine Fairy, DO  metoprolol  succinate (TOPROL -XL) 25 MG 24 hr tablet Take 1 tablet (25 mg total) by mouth daily. Take with or immediately following a meal. 03/14/24   Fairy Frames, MD  MYRBETRIQ  25 MG TB24 tablet Take 25 mg by mouth daily. 09/29/23   [provider]  oxyCODONE  (OXY IR/ROXICODONE ) 5 MG immediate release tablet Take 2.5-5 mg by mouth 2 (two) times daily as needed for severe pain (pain score 7-10).    [provider]  pantoprazole  (PROTONIX ) 40 MG tablet Take 1 tablet (40 mg total) by mouth 2 (two) times daily. 04/09/23 04/08/24  Laurence Locus, DO  potassium chloride  SA (KLOR-CON  M) 20 MEQ tablet Take 1 tablet (20 mEq total) by mouth daily. 12/29/23   Fausto Burnard LABOR,  DO  QUEtiapine  (SEROQUEL ) 25 MG tablet Take 1 tablet (25 mg total) by mouth daily at 8 pm. 12/29/23   Fausto Burnard LABOR, DO  QUEtiapine  (SEROQUEL ) 50 MG tablet Take 1 tablet (50 mg total) by mouth at bedtime. 12/29/23   Fausto Burnard LABOR, DO  sodium bicarbonate  650 MG tablet Take 1 tablet (650 mg total) by mouth 2 (two) times daily. 12/29/23   Fausto Burnard LABOR, DO  traMADol  (ULTRAM ) 50 MG tablet Take 1 tablet (50 mg total) by mouth every 6 (six) hours as needed for moderate pain (pain score 4-6). 12/29/23   Fausto Burnard LABOR, DO    Allergies: Pine, Pollen extract, and Caduet  [amlodipine -atorvastatin]    Review of Systems  Updated Vital Signs BP (!) 166/95   Pulse 100   Temp 98.1 F (36.7 C) (Oral)   Resp 19   SpO2 100%   Physical Exam  (all labs ordered are listed, but only abnormal results are displayed) Labs Reviewed  CBC WITH DIFFERENTIAL/PLATELET - Abnormal; Notable for the following components:      Result Value   WBC 10.7 (*)    RDW 18.2 (*)    Platelets 493 (*)    Neutro Abs 8.0 (*)    All other components within normal limits  COMPREHENSIVE METABOLIC PANEL WITH GFR - Abnormal; Notable for the following components:   BUN 34 (*)    Creatinine, Ser 1.26 (*)    Albumin 3.3 (*)    GFR, Estimated 44 (*)    All other components within normal limits  PRO BRAIN NATRIURETIC PEPTIDE - Abnormal; Notable for the following components:   Pro Brain Natriuretic Peptide 4,058.0 (*)    All other components within normal limits  TSH - Abnormal; Notable for the following components:   TSH 5.240 (*)    All other components within normal limits  URINALYSIS, W/ REFLEX TO CULTURE (INFECTION SUSPECTED) - Abnormal; Notable for the following components:   Protein, ur 30 (*)    Nitrite POSITIVE (*)    Leukocytes,Ua TRACE (*)    Bacteria, UA FEW (*)    All other components within normal limits  URINE CULTURE  T3, FREE  I-STAT CG4 LACTIC ACID, ED  I-STAT CG4 LACTIC ACID, ED    EKG: None  Radiology: No results found.  {Document cardiac monitor, telemetry assessment procedure when appropriate:32947} Procedures   Medications Ordered in the ED  oxyCODONE -acetaminophen  (PERCOCET/ROXICET) 5-325 MG per tablet 1 tablet (has no administration in time range)  furosemide  (LASIX ) injection 20 mg (has no administration in time range)      {Click here for ABCD2, HEART and other calculators REFRESH Note before signing:1}                              Medical Decision Making Amount and/or Complexity of Data Reviewed Labs: ordered. Radiology:  ordered.  Risk Prescription drug management.   ***  {Document critical care time when appropriate  Document review of labs and clinical decision tools ie CHADS2VASC2, etc  Document your independent review of radiology images and any outside records  Document your discussion with family members, caretakers and with consultants  Document social determinants of health affecting pt's care  Document your decision making why or why not admission, treatments were needed:32947:::1}   Final diagnoses:  None    ED Discharge Orders     None        "

## 2024-03-22 NOTE — ED Provider Notes (Signed)
 " [place] [facility] Provider Note   CSN: [id number] Arrival date & time: 03/22/24  1820     Patient presents with: No chief complaint on file.   Monica Conway is a 79 y.o. female with history of E. coli UTI, cognitive impairment, paroxysmal A-fib not anticoagulated, acute GI bleeding, acute on chronic CHF, failure to thrive, hypertension.  Presents to ED complaining of bilateral leg swelling and weeping.  Reports that this was noted yesterday.  Patient recently admitted for heart failure exacerbation with echocardiogram EF of 55 to 60%.  Patient does not take diuretics.  She reports that she has been more short of breath recently especially when she ambulates and perform certain tasks around the house such as vacuuming.  She denies any chest pain, nausea, vomiting or fevers at home.  She denies any falls.  She reports she lives at home and has home health aides come to assist with her medications.  She also complains of pain on her buttocks and reports that she has been sitting more recently.  Patient does endorse dysuria and reports this has been occurring since she was last hospitalized.  She attributes this burning to being wiped too hard upon her last hospitalization.  HPI     Prior to Admission medications  Medication Sig Start Date End Date Taking? Authorizing Provider  mirabegron  ER (MYRBETRIQ ) 50 MG TB24 tablet Take 50 mg by mouth daily.   Yes [provider]  acetaminophen  (TYLENOL ) 500 MG tablet Take 2 tablets (1,000 mg total) by mouth every 8 (eight) hours. 12/29/23   Fausto Burnard LABOR, DO  albuterol  (VENTOLIN  HFA) 108 (90 Base) MCG/ACT inhaler Inhale 2 puffs into the lungs every 6 (six) hours as needed for wheezing or shortness of breath.    [provider]  diltiazem  (CARDIZEM  CD) 120 MG 24 hr capsule Take 1 capsule (120 mg total) by mouth daily. 03/14/24   Fairy Frames, MD  feeding supplement (ENSURE PLUS HIGH PROTEIN)  LIQD Take 237 mLs by mouth 2 (two) times daily between meals. 12/29/23   Fausto Burnard A, DO  lidocaine  (LIDODERM ) 5 % Place 1 patch onto the skin daily. Remove & Discard patch within 12 hours or as directed by MD 10/28/23   [place], Camellia PARAS, DO  magnesium  oxide (MAG-OX) 400 (240 Mg) MG tablet Take 1 tablet (400 mg total) by mouth daily. 12/29/23   Fausto Burnard LABOR, DO  metoprolol  succinate (TOPROL -XL) 25 MG 24 hr tablet Take 1 tablet (25 mg total) by mouth daily. Take with or immediately following a meal. 03/14/24   Fairy Frames, MD  pantoprazole  (PROTONIX ) 40 MG tablet Take 1 tablet (40 mg total) by mouth 2 (two) times daily. 04/09/23 04/08/24  Laurence [place], DO  potassium chloride  SA (KLOR-CON  M) 20 MEQ tablet Take 1 tablet (20 mEq total) by mouth daily. 12/29/23   Fausto Burnard A, DO  QUEtiapine  (SEROQUEL ) 25 MG tablet Take 1 tablet (25 mg total) by mouth daily at 8 pm. 12/29/23   Fausto Burnard A, DO  QUEtiapine  (SEROQUEL ) 50 MG tablet Take 1 tablet (50 mg total) by mouth at bedtime. 12/29/23   Fausto Burnard LABOR, DO  sodium bicarbonate  650 MG tablet Take 1 tablet (650 mg total) by mouth 2 (two) times daily. 12/29/23   Fausto Burnard A, DO  traMADol  (ULTRAM ) 50 MG tablet Take 1 tablet (50 mg total) by mouth every 6 (six) hours as needed for moderate pain (pain score 4-6). 12/29/23  Fausto Burnard LABOR, DO    Allergies: Pine, Pollen extract, and Caduet [amlodipine -atorvastatin]    Review of Systems  Respiratory:  Positive for shortness of breath.   Genitourinary:  Positive for dysuria.  All other systems reviewed and are negative.   Updated Vital Signs BP (!) 139/97   Pulse 97   Temp 98 F (36.7 C)   Resp (!) 21   SpO2 98%   Physical Exam Vitals and nursing note reviewed.  Constitutional:      General: She is not in acute distress.    Appearance: She is well-developed.  HENT:     Head: Normocephalic and atraumatic.  Eyes:     Conjunctiva/sclera: Conjunctivae normal.  Cardiovascular:      Rate and Rhythm: Normal rate and regular rhythm.     Heart sounds: No murmur heard. Pulmonary:     Effort: Pulmonary effort is normal. No respiratory distress.     Breath sounds: Normal breath sounds.  Abdominal:     Palpations: Abdomen is soft.     Tenderness: There is no abdominal tenderness.  Musculoskeletal:        General: No swelling.     Cervical back: Neck supple.     Right lower leg: Edema present.     Left lower leg: Edema present.     Comments: 2+ pitting bilateral lower extremity edema  Skin:    General: Skin is warm and dry.     Capillary Refill: Capillary refill takes less than 2 seconds.  Neurological:     Mental Status: She is alert and oriented to person, place, and time. Mental status is at baseline.  Psychiatric:        Mood and Affect: Mood normal.     (all labs ordered are listed, but only abnormal results are displayed) Labs Reviewed  CBC WITH DIFFERENTIAL/PLATELET - Abnormal; Notable for the following components:      Result Value   WBC 10.7 (*)    RDW 18.2 (*)    Platelets 493 (*)    Neutro Abs 8.0 (*)    All other components within normal limits  COMPREHENSIVE METABOLIC PANEL WITH GFR - Abnormal; Notable for the following components:   BUN 34 (*)    Creatinine, Ser 1.26 (*)    Albumin 3.3 (*)    GFR, Estimated 44 (*)    All other components within normal limits  PRO BRAIN NATRIURETIC PEPTIDE - Abnormal; Notable for the following components:   Pro Brain Natriuretic Peptide 4,058.0 (*)    All other components within normal limits  TSH - Abnormal; Notable for the following components:   TSH 5.240 (*)    All other components within normal limits  URINALYSIS, W/ REFLEX TO CULTURE (INFECTION SUSPECTED) - Abnormal; Notable for the following components:   Protein, ur 30 (*)    Nitrite POSITIVE (*)    Leukocytes,Ua TRACE (*)    Bacteria, UA FEW (*)    All other components within normal limits  CBC WITH DIFFERENTIAL/PLATELET - Abnormal; Notable  for the following components:   WBC 10.8 (*)    RDW 18.2 (*)    Platelets 446 (*)    Neutro Abs 8.0 (*)    All other components within normal limits  URINE CULTURE  T3, FREE  COMPREHENSIVE METABOLIC PANEL WITH GFR  MAGNESIUM   PRO BRAIN NATRIURETIC PEPTIDE  I-STAT CG4 LACTIC ACID, ED    EKG: EKG Interpretation Date/Time:  Tuesday March 22 2024 18:45:29 EST Ventricular Rate:  102  PR Interval:  206 QRS Duration:  84 QT Interval:  374 QTC Calculation: 487 R Axis:   -33  Text Interpretation: Accelerated Junctional rhythm Left axis deviation Moderate voltage criteria for LVH, may be normal variant ( R in aVL , Cornell product ) Anterior infarct , age undetermined Abnormal ECG When compared with ECG of 22-Mar-2024 18:44, PREVIOUS ECG IS PRESENT Confirmed by Theadore Sharper [phone]) on 03/22/2024 11:01:35 PM  Radiology: [facility] Chest 2 View Result Date: 03/22/2024 EXAM: 2 VIEW(S) XRAY OF THE CHEST 03/22/2024 11:04:01 PM COMPARISON: 03/03/2024 CLINICAL HISTORY: Shortness of breath, volume overload. FINDINGS: LUNGS AND PLEURA: No focal pulmonary opacity. No pleural effusion. No pneumothorax. HEART AND MEDIASTINUM: Calcification of the aorta. Large esophageal hiatal hernia behind the heart. Heart size and pulmonary vascularity are normal. Mediastinal contours appear intact. BONES AND SOFT TISSUES: Degenerative changes in the spine and shoulders. Compression deformity of a lower thoracic vertebra, unchanged since the prior study. IMPRESSION: 1. No acute cardiopulmonary findings. 2. Large esophageal hiatal hernia. Electronically signed by: Elsie Gravely MD 03/22/2024 11:15 PM EST RP Workstation: [id number]    Procedures   Medications Ordered in the ED  metoprolol  succinate (TOPROL -XL) 24 hr tablet 25 mg (has no administration in time range)  acetaminophen  (TYLENOL ) tablet 650 mg (has no administration in time range)    Or  acetaminophen  (TYLENOL ) suppository 650 mg (has no administration in time  range)  melatonin tablet 3 mg (has no administration in time range)  ondansetron  (ZOFRAN ) injection 4 mg (has no administration in time range)  furosemide  (LASIX ) injection 20 mg (has no administration in time range)  oxyCODONE -acetaminophen  (PERCOCET/ROXICET) 5-325 MG per tablet 1 tablet (1 tablet Oral Given 03/22/24 2247)  furosemide  (LASIX ) injection 20 mg (20 mg Intravenous Given 03/22/24 2341)  cefTRIAXone  (ROCEPHIN ) 1 g in sodium chloride  0.9 % 100 mL IVPB (0 g Intravenous Stopped 03/23/24 0127)  phenazopyridine  (PYRIDIUM ) tablet 100 mg (100 mg Oral Given 03/23/24 0138)    Clinical Course as of 03/23/24 0609  Tue Mar 22, 2024  2256 CITROBACTER YOUNGAE Abnormal  [CG]  Wed Mar 23, 2024  0257 Elevated BNP, dyspnea on exertion [CG]    Clinical Course User Index [CG] Ruthell Lonni FALCON, PA-C   Medical Decision Making Amount and/or Complexity of Data Reviewed Labs: ordered. Radiology: ordered. ECG/medicine tests: ordered.  Risk Prescription drug management. Decision regarding hospitalization.   79 year old female presents to the ED for evaluation.  On exam, HD stable.  Lung sounds clear bilaterally, no hypoxia.  Abdomen soft and compressible.  Neuroexam at baseline.  Patient is alert and oriented x 4.  She has 2+ pitting edema bilaterally to lower extremities.  Assessed with CBC, CMP, urinalysis, BNP, lactic acid, TSH, chest x-ray.  Patient given oxycodone  for pain.  CBC with leukocytosis 10.7, no anemia.  Metabolic panel with creatinine 1.26 which is improving from acute kidney injury which patient was admitted for the beginning of this month.  Her BUN is 34, GFR 44.  No electrolyte derangement.  No elevated LFT.  Urinalysis is nitrate positive, leukocytes and bacteria also present.  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  Given 2 g Rocephin  at this time.  BNP elevated to 4058.  Given 20 mg Lasix  and patient is  diuresing well.  Chest x-ray collected without findings of effusions.  EKG collected which is nonischemic, does show sinus tachycardia possibly in atrial fibrillation.  This could be because of patient elevated  BNP, there is the possibility patient is not taking medications correctly as she has all of her medications in 1 bag and seems slightly confused about which medications to take.  Lactic acid 0.8.  At this time patient does require admission to hospital secondary to volume overload.  Patient requires diuresis and does not take diuretics, requires observation during thi. Period was admitted to hospitalist, Dr. Marcene.  Patient amenable to plan.   Final diagnoses:  Hypervolemia, unspecified hypervolemia type  Acute cystitis without hematuria    ED Discharge Orders     None          Ruthell Lonni JULIANNA DEVONNA 03/23/24 9390    Theadore Ozell HERO, MD 03/23/24 [phone]  "

## 2024-03-22 NOTE — ED Notes (Signed)
 Helped patient to the restroom in the lobby. UA has been obtained. Pt is unable to walk by herself.

## 2024-03-22 NOTE — ED Provider Triage Note (Signed)
 Emergency Medicine Provider Triage Evaluation Note  Monica Conway , a 79 y.o. female  was evaluated in triage.  Pt complains of worsening peripheral edema in the legs and redness and pain.  Review of Systems  Positive: Several days of worsening peripheral edema in both lower extremities with weeping and pain and redness and warmth.  History of this but never this bad Negative: No fevers, chills, congestion, cough, nausea, vomiting, constipation, diarrhea, or urinary changes.  Physical Exam  BP (!) 141/127 (BP Location: Right Arm)   Pulse 99   Temp 98.2 F (36.8 C) (Oral)   Resp 19   SpO2 100%  Gen:   Awake, no distress   Resp:  Normal effort  MSK:   Very edematous legs bilaterally with tenderness and erythema.  It is weeping.  Medical Decision Making  Medically screening exam initiated at 6:36 PM.  Appropriate orders placed.  Monica Conway was informed that the remainder of the evaluation will be completed by another provider, this initial triage assessment does not replace that evaluation, and the importance of remaining in the ED until their evaluation is complete.  Monica Conway is a 79 y.o. female with a past medical history atrial fibrillation, previous GI bleeding, previous stroke, and heart failure who presents with several days of rapidly worsening peripheral edema pain redness warmth and weeping of fluid from both legs.  Patient reports that she is having severe pain in both legs and they are red and weeping bilaterally.  She says that she has had some edema in the past but this is much worse.  She denies any injuries.  She reports no fevers, chills, chest pain shortness of breath and does not otherwise feel more fatigued and tired but is feeling worsened edema in her legs.  She denies any injuries to them.  On exam, lungs clear.  Chest nontender.  Abdomen nontender.  Patient has tenderness in both legs with erythema and edema bilaterally.  She could move them.  Will  get labs including proBNP and get an EKG to look for some new arrhythmia.  As she has no chest pain or shortness of breath we will hold on imaging of her chest at this time.  Patient tells me she does think she takes diuretics and has not missed them.  Anticipate reassessment after workup to determine disposition.      Maverick Patman, Lonni PARAS, MD 03/22/24 [phone]

## 2024-03-23 ENCOUNTER — Observation Stay (HOSPITAL_COMMUNITY)

## 2024-03-23 DIAGNOSIS — I959 Hypotension, unspecified: Secondary | ICD-10-CM

## 2024-03-23 DIAGNOSIS — I5033 Acute on chronic diastolic (congestive) heart failure: Secondary | ICD-10-CM | POA: Diagnosis present

## 2024-03-23 DIAGNOSIS — Z7189 Other specified counseling: Secondary | ICD-10-CM

## 2024-03-23 LAB — COMPREHENSIVE METABOLIC PANEL WITH GFR
ALT: 16 U/L (ref 0–44)
AST: 25 U/L (ref 15–41)
Albumin: 3.1 g/dL — ABNORMAL LOW (ref 3.5–5.0)
Alkaline Phosphatase: 106 U/L (ref 38–126)
Anion gap: 12 (ref 5–15)
BUN: 32 mg/dL — ABNORMAL HIGH (ref 8–23)
CO2: 22 mmol/L (ref 22–32)
Calcium: 9 mg/dL (ref 8.9–10.3)
Chloride: 106 mmol/L (ref 98–111)
Creatinine, Ser: 1.31 mg/dL — ABNORMAL HIGH (ref 0.44–1.00)
GFR, Estimated: 42 mL/min — ABNORMAL LOW
Glucose, Bld: 78 mg/dL (ref 70–99)
Potassium: 5.3 mmol/L — ABNORMAL HIGH (ref 3.5–5.1)
Sodium: 140 mmol/L (ref 135–145)
Total Bilirubin: 0.3 mg/dL (ref 0.0–1.2)
Total Protein: 6.9 g/dL (ref 6.5–8.1)

## 2024-03-23 LAB — CBC WITH DIFFERENTIAL/PLATELET
Abs Immature Granulocytes: 0.05 10*3/uL (ref 0.00–0.07)
Basophils Absolute: 0.1 10*3/uL (ref 0.0–0.1)
Basophils Relative: 1 %
Eosinophils Absolute: 0.1 10*3/uL (ref 0.0–0.5)
Eosinophils Relative: 1 %
HCT: 38.8 % (ref 36.0–46.0)
Hemoglobin: 12.4 g/dL (ref 12.0–15.0)
Immature Granulocytes: 1 %
Lymphocytes Relative: 14 %
Lymphs Abs: 1.5 10*3/uL (ref 0.7–4.0)
MCH: 31.2 pg (ref 26.0–34.0)
MCHC: 32 g/dL (ref 30.0–36.0)
MCV: 97.5 fL (ref 80.0–100.0)
Monocytes Absolute: 1 10*3/uL (ref 0.1–1.0)
Monocytes Relative: 9 %
Neutro Abs: 8 10*3/uL — ABNORMAL HIGH (ref 1.7–7.7)
Neutrophils Relative %: 74 %
Platelets: 446 10*3/uL — ABNORMAL HIGH (ref 150–400)
RBC: 3.98 MIL/uL (ref 3.87–5.11)
RDW: 18.2 % — ABNORMAL HIGH (ref 11.5–15.5)
WBC: 10.8 10*3/uL — ABNORMAL HIGH (ref 4.0–10.5)
nRBC: 0 % (ref 0.0–0.2)

## 2024-03-23 LAB — PRO BRAIN NATRIURETIC PEPTIDE: Pro Brain Natriuretic Peptide: 4790 pg/mL — ABNORMAL HIGH

## 2024-03-23 LAB — MAGNESIUM: Magnesium: 1.8 mg/dL (ref 1.7–2.4)

## 2024-03-23 MED ORDER — FUROSEMIDE 10 MG/ML IJ SOLN
20.0000 mg | Freq: Two times a day (BID) | INTRAMUSCULAR | Status: DC
  Administered 2024-03-23: 20 mg via INTRAVENOUS
  Filled 2024-03-23: qty 2

## 2024-03-23 MED ORDER — OXYCODONE-ACETAMINOPHEN 5-325 MG PO TABS
1.0000 | ORAL_TABLET | Freq: Four times a day (QID) | ORAL | Status: DC | PRN
  Administered 2024-03-23 – 2024-03-25 (×5): 1 via ORAL
  Filled 2024-03-23 (×5): qty 1

## 2024-03-23 MED ORDER — AMIODARONE LOAD VIA INFUSION
150.0000 mg | Freq: Once | INTRAVENOUS | Status: AC
  Administered 2024-03-23: 150 mg via INTRAVENOUS
  Filled 2024-03-23: qty 83.34

## 2024-03-23 MED ORDER — QUETIAPINE FUMARATE 50 MG PO TABS
50.0000 mg | ORAL_TABLET | Freq: Every day | ORAL | Status: DC
  Administered 2024-03-23: 50 mg via ORAL
  Filled 2024-03-23: qty 1

## 2024-03-23 MED ORDER — DILTIAZEM HCL ER COATED BEADS 120 MG PO CP24: 120.0000 mg | ORAL_CAPSULE | Freq: Every day | ORAL | Status: DC

## 2024-03-23 MED ORDER — METOPROLOL SUCCINATE ER 25 MG PO TB24
25.0000 mg | ORAL_TABLET | Freq: Every day | ORAL | Status: DC
  Administered 2024-03-23: 25 mg via ORAL
  Filled 2024-03-23: qty 1

## 2024-03-23 MED ORDER — PHENAZOPYRIDINE HCL 100 MG PO TABS
95.0000 mg | ORAL_TABLET | Freq: Once | ORAL | Status: AC
  Administered 2024-03-23: 100 mg via ORAL
  Filled 2024-03-23: qty 1

## 2024-03-23 MED ORDER — SENNOSIDES-DOCUSATE SODIUM 8.6-50 MG PO TABS
1.0000 | ORAL_TABLET | Freq: Every day | ORAL | Status: DC
  Administered 2024-03-24 – 2024-03-31 (×5): 1 via ORAL
  Filled 2024-03-23 (×7): qty 1

## 2024-03-23 MED ORDER — SODIUM CHLORIDE 0.9 % IV SOLN: INTRAVENOUS | Status: DC

## 2024-03-23 MED ORDER — MELATONIN 3 MG PO TABS
3.0000 mg | ORAL_TABLET | Freq: Every evening | ORAL | Status: DC | PRN
  Administered 2024-03-23 – 2024-03-31 (×5): 3 mg via ORAL
  Filled 2024-03-23 (×5): qty 1

## 2024-03-23 MED ORDER — ONDANSETRON HCL 4 MG/2ML IJ SOLN: 4.0000 mg | Freq: Four times a day (QID) | INTRAMUSCULAR | Status: DC | PRN

## 2024-03-23 MED ORDER — MAGNESIUM OXIDE -MG SUPPLEMENT 400 (240 MG) MG PO TABS
400.0000 mg | ORAL_TABLET | Freq: Every day | ORAL | Status: DC
  Administered 2024-03-23 – 2024-03-24 (×2): 400 mg via ORAL
  Filled 2024-03-23 (×3): qty 1

## 2024-03-23 MED ORDER — SODIUM ZIRCONIUM CYCLOSILICATE 10 G PO PACK
10.0000 g | PACK | Freq: Once | ORAL | Status: AC
  Administered 2024-03-23: 10 g via ORAL
  Filled 2024-03-23: qty 1

## 2024-03-23 MED ORDER — ACETAMINOPHEN 325 MG PO TABS
650.0000 mg | ORAL_TABLET | Freq: Four times a day (QID) | ORAL | Status: DC | PRN
  Administered 2024-03-23 – 2024-03-29 (×4): 650 mg via ORAL
  Filled 2024-03-23 (×6): qty 2

## 2024-03-23 MED ORDER — PANTOPRAZOLE SODIUM 40 MG PO TBEC
40.0000 mg | DELAYED_RELEASE_TABLET | Freq: Two times a day (BID) | ORAL | Status: DC
  Administered 2024-03-23 – 2024-04-01 (×17): 40 mg via ORAL
  Filled 2024-03-23 (×19): qty 1

## 2024-03-23 MED ORDER — DILTIAZEM HCL-DEXTROSE 125-5 MG/125ML-% IV SOLN (PREMIX)
5.0000 mg/h | INTRAVENOUS | Status: DC
  Administered 2024-03-23 (×2): 5 mg/h via INTRAVENOUS
  Filled 2024-03-23: qty 125

## 2024-03-23 MED ORDER — DIGOXIN 0.25 MG/ML IJ SOLN
0.2500 mg | Freq: Once | INTRAMUSCULAR | Status: AC
  Administered 2024-03-23: 0.25 mg via INTRAVENOUS
  Filled 2024-03-23: qty 2

## 2024-03-23 MED ORDER — METOPROLOL SUCCINATE ER 25 MG PO TB24: 25.0000 mg | ORAL_TABLET | Freq: Every day | ORAL | Status: DC

## 2024-03-23 MED ORDER — SODIUM CHLORIDE 0.9 % IV SOLN
1.0000 g | INTRAVENOUS | Status: DC
  Administered 2024-03-24: 1 g via INTRAVENOUS
  Filled 2024-03-23 (×2): qty 10

## 2024-03-23 MED ORDER — POLYETHYLENE GLYCOL 3350 17 G PO PACK
17.0000 g | PACK | Freq: Every day | ORAL | Status: DC
  Administered 2024-03-25 – 2024-03-28 (×2): 17 g via ORAL
  Filled 2024-03-23 (×5): qty 1

## 2024-03-23 MED ORDER — AMIODARONE HCL IN DEXTROSE 360-4.14 MG/200ML-% IV SOLN
30.0000 mg/h | INTRAVENOUS | Status: DC
  Administered 2024-03-24 (×2): 30 mg/h via INTRAVENOUS
  Filled 2024-03-23 (×5): qty 200

## 2024-03-23 MED ORDER — ACETAMINOPHEN 650 MG RE SUPP: 650.0000 mg | Freq: Four times a day (QID) | RECTAL | Status: DC | PRN

## 2024-03-23 MED ORDER — AMIODARONE HCL IN DEXTROSE 360-4.14 MG/200ML-% IV SOLN
60.0000 mg/h | INTRAVENOUS | Status: DC
  Administered 2024-03-23: 60 mg/h via INTRAVENOUS
  Filled 2024-03-23: qty 200

## 2024-03-23 MED ORDER — ENOXAPARIN SODIUM 30 MG/0.3ML IJ SOSY
30.0000 mg | PREFILLED_SYRINGE | INTRAMUSCULAR | Status: DC
  Administered 2024-03-23 – 2024-03-24 (×2): 30 mg via SUBCUTANEOUS
  Filled 2024-03-23 (×2): qty 0.3

## 2024-03-23 MED ORDER — ENSURE PLUS HIGH PROTEIN PO LIQD
237.0000 mL | Freq: Two times a day (BID) | ORAL | Status: DC
  Administered 2024-03-24 (×2): 237 mL via ORAL

## 2024-03-23 MED ORDER — QUETIAPINE FUMARATE 25 MG PO TABS
25.0000 mg | ORAL_TABLET | Freq: Every day | ORAL | Status: DC
  Administered 2024-03-23: 25 mg via ORAL
  Filled 2024-03-23: qty 1

## 2024-03-23 NOTE — ED Notes (Signed)
 RN attempted to feel dorsalis pedis pulses. Pt started to scream from pain. Pt has sensation in both feet.

## 2024-03-23 NOTE — TOC CM/SW Note (Addendum)
 Transition of Care [facility]) - Inpatient Brief Assessment   Patient Details  Name: Monica Conway MRN: [id number] Date of Birth: August 07, 1945  Transition of Care [facility] CM/SW Contact:    Waddell Barnie Rama, RN Phone Number: 03/23/2024, 1:25 PM   Clinical Narrative: From home with aide services from 9 am to 8pm ([facility]), has PCP and insurance on file, states has no HH services in place at this time , has hospital bed at home.  She will need ambulance transport home at dc and family is support system, .  Pta self ambulatory.   From last admit her brother contacted [facility] about doing medications in bubble packs.  ICM will continue to follow. Await pt eval.   Transition of Care Asessment: Insurance and Status: Insurance coverage has been reviewed Patient has primary care physician: Yes Home environment has been reviewed: home with care givers from 9 am to 8 pm ([facility]) Prior level of function:: ambulatory Prior/Current Home Services: Current home services (hosp bed) Social Drivers of Health Review: SDOH reviewed no interventions necessary Readmission risk has been reviewed: Yes Transition of care needs: transition of care needs identified, TOC will continue to follow

## 2024-03-23 NOTE — ED Notes (Signed)
 Pt saturated in urine. RN and NT changed pts chuck, diaper, and gown. Pt repositioned in bed and purwick in place.

## 2024-03-23 NOTE — Progress Notes (Signed)
Heart rate too high for accurate echo at this time. 

## 2024-03-23 NOTE — Progress Notes (Signed)
 Patient arrived on unit at 1130, HR was sustained in 140-150s, called pharmacy for diltizem, and consulted doctor, and decided on diltizem drip.    Started dilt drip at 1156. Drip was going for 30 minutes and found BP to be too soft to continue drip. Stopped.  Cariology master paged at 1323, said they will arrive to look at patient when they can. Patient has been in Afib RVR for 2.5 hours on unit, and there are concerns of her heart getting tired.  Concerned to start amio 'cause may convert to sinus and throw a clot. BP has been too soft for cardizem  drip.

## 2024-03-23 NOTE — Consult Note (Signed)
 "  Cardiology Consultation   Patient ID: Monica Conway MRN: [id number]; DOB: 03-25-1945  Admit date: 03/22/2024 Date of Consult: 03/23/2024  PCP:  [facility]   [facility] Providers Cardiologist:  None        Patient Profile: Monica Conway is a 79 y.o. 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 Marsha Ada.  History of Present Illness: Monica Conway has medical history as stated above. Of note, no history of outpatient cardiology follow-up on file. Patient was hospitalized in November 2024 for acute CVA thought to be cardioembolic in nature due to new onset atrial fibrillation noted on telemetry.  Was on Xarelto earlier to arrival, switched to Eliquis  during this hospitalization.  She was admitted in February 2025 for acute GI bleed on Eliquis .  Reversed with Kcentra  and transfused 3 units of PRBCs.  CTA abdomen consistent with proctocolitis and acute pancreatitis; no findings to specifically localize GI bleeding.  EGD showed erosive gastritis with nonbleeding gastric and duodenal ulcers.  Discharged with plan to follow-up with outpatient cardiology to determine when Eliquis  could be restarted.    Seen in the ED in August 2025 for mechanical fall.  Imaging revealed L2 compression fracture and remote pubic rami fractures.  Noted to be in sinus rhythm at that time.  Of note, it is unclear when patient restarted on Eliquis , however, it is documented in the chart that she was not on Eliquis  at this time.  Was discharged home in stable condition but admitted the following day due to intractable back pain and difficulty with ambulation.  Managed with pain control, seen by PT/OT with the recommendation of SNF placement and  discharge to rehab.  Hospitalized in October 2025 for acute metabolic encephalopathy thought to be secondary to urosepsis and rapid A-fib.  Rate control achieved with Cardizem  drip and metoprolol .  Patient was discharged on PO diltiazem  180 mg daily and metoprolol  succinate 50 mg daily.  Recently hospitalized again in early January 2026 for failure to thrive in an adult in the setting of Citrobacter UTI.  Initially noted to be in rapid A-fib, then bradycardic to the 50s on home Cardizem  and metoprolol .  Echo showed EF 55-60%, no RWMA, normal RV. Outpatient palliative care referral was sent during this hospitalization.  Temporary rehab at SNF was recommended but patient declined.  She was discharged home on reduced doses diltiazem  120 mg daily and metoprolol  succinate 25 mg daily.  Presently, patient was BIB EMS from home on 1/27 complaining of bilateral leg swelling and weeping.  proBNP 4,058.  No effusions on CXR.  UA positive for nitrites, leukocytes and bacteria also present.  Given IV Lasix  20 mg x1 and started on IV diltiazem  in the ED.  Admitted for ADHF and acute cystitis.  Cardiology was consulted for further evaluation and management of acute on chronic HFpEF.  Upon speaking to the patient today, patient exhibited some confusion although oriented to person and place. She was in distress due to severe pain in both of her knees and legs. States that she has had lower extremity swelling for at least a week prior to arrival. Does not take any diuretic at home. Admits that she had been eating more salty foods as of late. States that she has  had good urine output after receiving IV Lasix  in the ED, however, she is unable to note any improvement in her lower extremity swelling. Denies any chest pain or dyspnea at this time. Patient requested to end interview due to her pain and exhaustion.   Past Medical History:  Diagnosis Date   Acute on chronic congestive heart failure (HCC) 03/04/2024   Arthritis     Asthma    Atrial fibrillation with RVR (HCC) 12/29/2023   Blood transfusion without reported diagnosis    Cerebrovascular accident (CVA) (HCC) 01/31/2023   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 (HCC) 01/26/2023   Closed left hip fracture (HCC) 08/03/2015   Hypertension    Hypokalemia 04/08/2023   Hypomagnesemia 12/29/2023   Metabolic acidosis 01/26/2023   Osteoporosis    Sepsis secondary to UTI (HCC) 12/18/2023    Past Surgical History:  Procedure Laterality Date   COLON SURGERY     ESOPHAGOGASTRODUODENOSCOPY N/A 04/04/2023   Procedure: ESOPHAGOGASTRODUODENOSCOPY (EGD);  Surgeon: Rosalie Kitchens, MD;  Location: [facility];  Service: Gastroenterology;  Laterality: N/A;   TOTAL HIP ARTHROPLASTY Left 08/03/2015   Procedure: TOTAL LEFT HIP ARTHROPLASTY ANTERIOR APPROACH;  Surgeon: Redell Shoals, MD;  Location: [facility];  Service: Orthopedics;  Laterality: Left;   TUBAL LIGATION         Scheduled Meds:  enoxaparin  (LOVENOX ) injection  30 mg Subcutaneous Q24H   feeding supplement  237 mL Oral BID BM   magnesium  oxide  400 mg Oral Daily   pantoprazole   40 mg Oral BID   polyethylene glycol  17 g Oral Daily   QUEtiapine   25 mg Oral Q2000   QUEtiapine   50 mg Oral QHS   senna-docusate  1 tablet Oral QHS   Continuous Infusions:  sodium chloride      [START ON 03/24/2024] cefTRIAXone  (ROCEPHIN )  IV     diltiazem  (CARDIZEM ) infusion 5 mg/hr (03/23/24 1428)   PRN Meds: acetaminophen  **OR** acetaminophen , melatonin, ondansetron  (ZOFRAN ) IV, oxyCODONE -acetaminophen   Allergies:   Allergies[1]  Social History:   Social History   Socioeconomic History   Marital status: Divorced    Spouse name: Not on file   Number of children: Not on file   Years of education: Not on file   Highest education level: Not on file  Occupational History   Not on file  Tobacco Use   Smoking status: Never   Smokeless tobacco: Never  Substance and Sexual Activity   Alcohol  use: No   Drug  use: No   Sexual activity: Not Currently  Other Topics Concern   Not on file  Social History Narrative   Not on file   Social Drivers of Health   Tobacco Use: Low Risk (03/14/2024)   Patient History    Smoking Tobacco Use: Never    Smokeless Tobacco Use: Never    Passive Exposure: Not on file  Financial Resource Strain: Not on file  Food Insecurity: No Food Insecurity (03/05/2024)   Epic    Worried About Programme Researcher, Broadcasting/film/video in the Last Year: Never true    Ran Out of Food in the Last Year: Never true  Transportation Needs: Unmet Transportation Needs (03/05/2024)   Epic    Lack of Transportation (Medical): Yes    Lack of Transportation (Non-Medical): Yes  Physical Activity: Not on file  Stress: Not on file  Social Connections: Patient Declined (03/06/2024)   Social Connection and Isolation Panel    Frequency of Communication with Friends and Family: Patient declined  Frequency of Social Gatherings with Friends and Family: Patient declined    Attends Religious Services: Patient declined    Active Member of Clubs or Organizations: Patient declined    Attends Banker Meetings: Patient declined    Marital Status: Patient declined  Intimate Partner Violence: Not At Risk (03/05/2024)   Epic    Fear of Current or Ex-Partner: No    Emotionally Abused: No    Physically Abused: No    Sexually Abused: No  Depression (PHQ2-9): Not on file  Alcohol  Screen: Not on file  Housing: Low Risk (03/05/2024)   Epic    Unable to Pay for Housing in the Last Year: No    Number of Times Moved in the Last Year: 0    Homeless in the Last Year: No  Utilities: Not At Risk (03/05/2024)   Epic    Threatened with loss of utilities: No  Health Literacy: Not on file    Family History:    Family History  Problem Relation Age of Onset   Cataracts Sister    Hypertension Sister    Hypertension Brother      ROS:  Please see the history of present illness.   All other ROS reviewed and  negative.     Physical Exam/Data: Vitals:   03/23/24 1400 03/23/24 1425 03/23/24 1440 03/23/24 1442  BP: 91/69 100/64 (!) 84/65 (!) 80/62  Pulse: (!) 149 (!) 136 (!) 138 (!) 142  Resp: 16 17 13 19   Temp:      TempSrc:      SpO2: 96% 91% 98% 93%  Weight:      Height:       No intake or output data in the 24 hours ending 03/23/24 1607    03/23/2024   11:36 AM 03/15/2024    5:00 AM 03/14/2024    5:00 AM  Last 3 Weights  Weight (lbs) 87 lb 11.9 oz 86 lb 13.8 oz 92 lb 2.4 oz  Weight (kg) 39.8 kg 39.4 kg 41.8 kg     Body mass index is 16.05 kg/m.  General: Frail chronically ill-appearing elderly female, in fetal position on bed, in acute distress due to severe pain Neck: Unable to assess/pt declined exam due to severe pain Vascular: Attempted to assess distal pulses but pt immediately protested due to pain Cardiac: Unable to assess/pt declined exam due to severe pain Lungs: Unable to assess/pt declined exam due to severe pain Ext: Unable to fully assess due to pt pain, however, overtly edematous bilateral lower extremities by visual examination Musculoskeletal: No deformities Skin: Bilateral lower extremities erythematous with some peeling of the skin and weeping blisters   EKG: The EKG was personally reviewed and demonstrates: ?Atrial flutter vs atrial tachycardia with significant artifact, rate 105 bpm  Telemetry: Telemetry was personally reviewed and demonstrates: Rapid atrial fibrillation/flutter in the 140s  Relevant CV Studies:  Echo [03/04/24]: 1. Left ventricular ejection fraction, by estimation, is 55 to 60% . The left ventricle has normal function. The left ventricle has no regional wall motion abnormalities. Left ventricular diastolic parameters were normal. 2. Right ventricular systolic function is low normal. The right ventricular size is mildly enlarged. Tricuspid regurgitation signal is inadequate for assessing PA pressure. 3. The mitral valve is degenerative. No evidence  of mitral valve regurgitation. No evidence of mitral stenosis. 4. The aortic valve is calcified. Aortic valve regurgitation is not visualized. Mild aortic valve stenosis. Aortic valve area, by VTI measures 1. 20 cm . Aortic valve  mean gradient measures 9. 0 mmHg. Aortic valve Vmax measures 2. 28 m/ s.  CTA GIB [04/03/23]: FINDINGS: VASCULAR Normal contour and caliber of the abdominal aorta. Moderate mixed calcific atherosclerosis. No evidence of aneurysm, dissection, or other acute aortic pathology. Standard branching pattern of the abdominal aorta with solitary bilateral renal arteries.   Laboratory Data: High Sensitivity Troponin:  No results for input(s): TROPONINIHS in the last 720 hours.  Recent Labs  Lab 03/03/24 1817 03/03/24 1829  TRNPT 113* 111*      Chemistry Recent Labs  Lab 03/22/24 1901 03/23/24 0525  NA 140 140  K 5.1 5.3*  CL 105 106  CO2 23 22  GLUCOSE 86 78  BUN 34* 32*  CREATININE 1.26* 1.31*  CALCIUM  9.1 9.0  MG  --  1.8  GFRNONAA 44* 42*  ANIONGAP 12 12    Recent Labs  Lab 03/22/24 1901 03/23/24 0525  PROT 7.5 6.9  ALBUMIN 3.3* 3.1*  AST 28 25  ALT 19 16  ALKPHOS 108 106  BILITOT 0.4 0.3   Lipids No results for input(s): CHOL, TRIG, HDL, LABVLDL, LDLCALC, CHOLHDL in the last 168 hours.  Hematology Recent Labs  Lab 03/22/24 1901 03/23/24 0525  WBC 10.7* 10.8*  RBC 4.20 3.98  HGB 13.3 12.4  HCT 41.2 38.8  MCV 98.1 97.5  MCH 31.7 31.2  MCHC 32.3 32.0  RDW 18.2* 18.2*  PLT 493* 446*   Thyroid  Recent Labs  Lab 03/22/24 1901  TSH 5.240*    BNP Recent Labs  Lab 03/22/24 1901 03/23/24 0525  PROBNP 4,058.0* 4,790.0*    DDimer No results for input(s): DDIMER in the last 168 hours.  Radiology/Studies:  DG Chest 2 View Result Date: 03/22/2024 EXAM: 2 VIEW(S) XRAY OF THE CHEST 03/22/2024 11:04:01 PM COMPARISON: 03/03/2024 CLINICAL HISTORY: Shortness of breath, volume overload. FINDINGS: LUNGS AND PLEURA: No focal  pulmonary opacity. No pleural effusion. No pneumothorax. HEART AND MEDIASTINUM: Calcification of the aorta. Large esophageal hiatal hernia behind the heart. Heart size and pulmonary vascularity are normal. Mediastinal contours appear intact. BONES AND SOFT TISSUES: Degenerative changes in the spine and shoulders. Compression deformity of a lower thoracic vertebra, unchanged since the prior study. IMPRESSION: 1. No acute cardiopulmonary findings. 2. Large esophageal hiatal hernia. Electronically signed by: Elsie Gravely MD 03/22/2024 11:15 PM EST RP Workstation: [id number]     Assessment and Plan: Acute on chronic HFpEF Presented with acute onset bilateral leg swelling and pain proBNP 4,058 No pleural effusion on CXR S/p IV Lasix  20 mg x2, no I/O documented in chart Most recent echo 1/9 showed EF 55-60%, normal RV, mild AV stenosis (AVA 1.20 cm2, mean gradient 9.0 mmHg, Vmax 2.28 m/s) Echo this admission pending, postponed due to rapid HR Pt declined examination due to severe pain/distress, therefore unable to fully assess volume status. However, by visual inspection bilateral lower extremities overtly edematous with weeping blisters. Will likely need additional diuresis, however, cautious of rising creatinine and hypotension, will discuss with MD for further recs Continue strict I/Os, daily weights, daily BMPs  Paroxysmal atrial fibrillation with RVR History of CVA in 12/2022 thought to be cardioembolic/secondary to Afib. Was taken off Eliquis  for prior GI bleed in 03/2023 Home meds: Diltiazem  and metoprolol  succinate, although patient is unable to confirm at what dose and frequency she has been taking these medications ED EKG showed ?atrial flutter vs atrial tachycardia with significant artifact, rate 105 bpm TSH 5.24, free T3 pending Normal K, mag Telemetry shows rapid Afib/flutter in  the 140s Not a good candidate for antiarrhythmic due to risks of chemical cardioversion in  non-anticoagulated patient with history of GI bleed while on Eliquis . Will focus on rate control at this time Started on diltiazem  drip this morning, stopped shortly thereafter due to hypotension. Given IV digoxin  0.25 mg x1. If still unable to achieve rate control, can start metoprolol  tartrate 50 mg BID though please see MD note for final recs  Per primary Abnormal UA/presumed acute cystitis Bilateral knee osteoarthritis Osteoporosis FTT   Risk Assessment/Risk Scores:       New [place]  Heart Association (NYHA) Functional Class NYHA Class IV  CHA2DS2-VASc Score = 8   This indicates a 10.8% annual risk of stroke. The patient's score is based upon: CHF History: 1 HTN History: 1 Diabetes History: 0 Stroke History: 2 Vascular Disease History: 1 Age Score: 2 Gender Score: 1      For questions or updates, please contact [facility] Please consult [email or web address] for contact info under      Signed, Owen MARLA Daniels, PA-C  03/23/2024 4:07 PM     [1]  Allergies Allergen Reactions   Pine Shortness Of Breath   Pollen Extract Shortness Of Breath   Caduet [Amlodipine -Atorvastatin] Other (See Comments)    Unknown reaction   "

## 2024-03-23 NOTE — Progress Notes (Signed)
" °  Carryover admission to the Day Admitter.  I discussed this case with the EDP, Lonni Lites, PA.  Per these discussions:   This is a 79 year old female with history of chronic diastolic heart failure, paroxysmal atrial fibrillation not on chronic anticoagulation in the setting of a history of gastrointestinal bleed, E. coli UTI, who is being admitted with suspected acute on chronic diastolic heart failure as well as potential acute cystitis after presenting with 3 to 4 days of progressive shortness of breath associated with worsening of edema in the bilateral lower extremities, in the absence of any reported chest pain.  She is also noting new onset dysuria over the last few days.  Has a reported history of chronic diastolic heart failure, with most recent echocardiogram occurring on 03/04/2024, with LVEF reported to be 55 to 60% at that time.  Not on any diuretic medications as an outpatient.  She was recently hospitalized in January 2026 in the [facility] health system for acute kidney injury as well as multiple electrolyte derangements.  Temporary rehab at SNF was recommended to the patient although she septally refuses recommendation was ultimately discharged to home on 03/14/2024.  Her creatinine today is reported to be 1.26, reported to be consistent with baseline.  Today's labs notable for elevated BNP of 4058.  In the setting of her report of recent dysuria, urinalysis was obtained, and showed 11-20 white blood cells.  Urine specimen sent for culture and the patient was started on Rocephin  for potential urinary tract infection.  In the ED this evening, she has received Lasix  20 mg IV x 1 dose, Rocephin  for potential urinary tract infection, and her home metoprolol  succinate 25 mg p.o. daily has been resumed.  I have placed an order for observation for further evaluation management of suspected acute on chronic diastolic heart failure, as above.  I have placed some additional preliminary admit  orders via the adult multi-morbid admission order set. I have also ordered Lasix  20 mg IV twice daily, along with repeat proBNP to be checked in the morning along with additional morning labs that include CMP, CBC, and magnesium  level.  I will defer to the admitting hospitalist decision making regarding additional antibiotics for potential acute cystitis.  Of note, per code status documentation from most recent prior hospitalizations, I have set the patient's code status as DNR.     Eva Pore, DO Hospitalist  "

## 2024-03-23 NOTE — ED Notes (Signed)
MD notified about pts HR  

## 2024-03-23 NOTE — H&P (Addendum)
 " History and Physical    Patient: Monica Conway FMW:[id number] DOB: [birth year] DOA: 03/22/2024 DOS: the patient was seen and examined on 03/23/2024 PCP: [facility]  Patient coming from: Home  Chief Complaint: No chief complaint on file.  HPI: Monica Conway is a 79 y.o. female with medical history significant of paroxysmal A-fib not on anticoagulation due to history of GI bleed, hypertension, history of CVA, Barrett's esophagus, osteoporosis, bilateral knee osteoarthritis, severe protein calorie malnutrition and recent admission from 1/8-1/19 for FTT iso Citrobacter UTI c/b AFRVR (tx with IV, CTX x3 days, PO cardizem /Toprol , and SNF recs but pt/family opted for home instead) who p/w SOB and BLE edema iso AFRVR c/b HFpEF exacerbation.  Pt is a very limited historian. From what I can gather per brief interview, the pt presented with a complaint of bilateral leg swelling and pain. The swelling in the legs began within the last 24 hours and has been associated with dry, cracking skin and a burning, irritated sensation in the feet. The patient reported feeling lost, deserved, and scared upon arrival. The patient did not recall if the heart was racing. The swelling and pain in the legs were suspected to be due to fluid retention, possibly related to a cardiac issue, which was explained to the patient.  In the ED, pt tachycardic and tachypneic on RA. Labs notable for  Cr 1.26>1.31 (discharge Cr 0.92 on 1/17), pro-BNP 4790, and UA w/ pyuria/bacteruria, and nitrite positivity. EDP started IV CTX, IV diltiazem , IV lasix  20mg  x1 and requested medicine admission.   Review of Systems: As mentioned in the history of present illness. All other systems reviewed and are negative. Past Medical History:  Diagnosis Date   Acute on chronic congestive heart failure (HCC) 03/04/2024   Arthritis    Asthma    Atrial fibrillation with RVR (HCC) 12/29/2023   Blood transfusion without reported diagnosis     Cerebrovascular accident (CVA) (HCC) 01/31/2023   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 (HCC) 01/26/2023   Closed left hip fracture (HCC) 08/03/2015   Hypertension    Hypokalemia 04/08/2023   Hypomagnesemia 12/29/2023   Metabolic acidosis 01/26/2023   Osteoporosis    Sepsis secondary to UTI (HCC) 12/18/2023   Past Surgical History:  Procedure Laterality Date   COLON SURGERY     ESOPHAGOGASTRODUODENOSCOPY N/A 04/04/2023   Procedure: ESOPHAGOGASTRODUODENOSCOPY (EGD);  Surgeon: Rosalie Kitchens, MD;  Location: [facility];  Service: Gastroenterology;  Laterality: N/A;   TOTAL HIP ARTHROPLASTY Left 08/03/2015   Procedure: TOTAL LEFT HIP ARTHROPLASTY ANTERIOR APPROACH;  Surgeon: Redell Shoals, MD;  Location: [facility];  Service: Orthopedics;  Laterality: Left;   TUBAL LIGATION     Social History:  reports that she has never smoked. She has never used smokeless tobacco. She reports that she does not drink alcohol  and does not use drugs.  Allergies[1]  Family History  Problem Relation Age of Onset   Cataracts Sister    Hypertension Sister    Hypertension Brother     Prior to Admission medications  Medication Sig Start Date End Date Taking? Authorizing Provider  diltiazem  (CARDIZEM  CD) 180 MG 24 hr capsule Take 180 mg by mouth daily.   Yes [provider]  magnesium  oxide (MAG-OX) 400 (240 Mg) MG tablet Take 1 tablet (400 mg total) by mouth daily. 12/29/23  Yes Fausto Sor A, DO  metoprolol  succinate (TOPROL -XL) 50 MG 24 hr tablet Take 50 mg by mouth daily. Take with or immediately following a  meal.   Yes [provider]  mirabegron  ER (MYRBETRIQ ) 50 MG TB24 tablet Take 50 mg by mouth daily.   Yes [provider]  potassium chloride  SA (KLOR-CON  M) 20 MEQ tablet Take 1 tablet (20 mEq total) by mouth daily. 12/29/23  Yes Fausto Sor A, DO  QUEtiapine  (SEROQUEL ) 25 MG tablet Take 1 tablet (25 mg total) by mouth daily at 8 pm. 12/29/23  Yes Fausto Sor A, DO  sodium bicarbonate  650 MG tablet Take 1 tablet (650 mg total) by mouth 2 (two) times daily. 12/29/23  Yes Fausto Sor A, DO  traMADol  (ULTRAM ) 50 MG tablet Take 1 tablet (50 mg total) by mouth every 6 (six) hours as needed for moderate pain (pain score 4-6). 12/29/23  Yes Fausto Sor A, DO  acetaminophen  (TYLENOL ) 500 MG tablet Take 2 tablets (1,000 mg total) by mouth every 8 (eight) hours. Patient not taking: Reported on 03/23/2024 12/29/23   Fausto Sor A, DO  albuterol  (VENTOLIN  HFA) 108 (90 Base) MCG/ACT inhaler Inhale 2 puffs into the lungs every 6 (six) hours as needed for wheezing or shortness of breath. Patient not taking: Reported on 03/23/2024    [provider]  diltiazem  (CARDIZEM  CD) 120 MG 24 hr capsule Take 1 capsule (120 mg total) by mouth daily. Patient not taking: Reported on 03/23/2024 03/14/24   Fairy Frames, MD  feeding supplement (ENSURE PLUS HIGH PROTEIN) LIQD Take 237 mLs by mouth 2 (two) times daily between meals. Patient not taking: Reported on 03/23/2024 12/29/23   Fausto Sor A, DO  lidocaine  (LIDODERM ) 5 % Place 1 patch onto the skin daily. Remove & Discard patch within 12 hours or as directed by MD Patient not taking: Reported on 03/23/2024 10/28/23   [place], Camellia PARAS, DO  metoprolol  succinate (TOPROL -XL) 25 MG 24 hr tablet Take 1 tablet (25 mg total) by mouth daily. Take with or immediately following a meal. Patient not taking: Reported on 03/23/2024 03/14/24   Fairy Frames, MD  pantoprazole  (PROTONIX ) 40 MG tablet Take 1 tablet (40 mg total) by mouth 2 (two) times daily. Patient not taking: Reported on 03/23/2024 04/09/23 04/08/24  Laurence Camellia, DO  QUEtiapine  (SEROQUEL ) 50 MG tablet Take 1 tablet (50 mg total) by mouth at bedtime. Patient not taking: Reported on 03/23/2024 12/29/23   Fausto Sor LABOR, DO    Physical Exam: Vitals:   03/23/24 9075 03/23/24 1025 03/23/24 1100 03/23/24 1136  BP:  (!) 124/91 111/73 98/74  Pulse:   (!) 145  (!) 139  Resp:  14 14 16   Temp: 97.6 F (36.4 C)   99 F (37.2 C)  TempSrc: Oral   Oral  SpO2:   98% 98%  Weight:    39.8 kg  Height:    5' 2 (1.575 m)   General: Alert, oriented x3, resting comfortably in no acute distress Respiratory: Bibasilar rales; no wheezing Cardiovascular: Regular rate and rhythm w/o m/r/g MSK: BLE swelling with 2+ pitting edema to knees; notable dry-cracked skin over all extremities   Data Reviewed:  Lab Results  Component Value Date   WBC 10.8 (H) 03/23/2024   HGB 12.4 03/23/2024   HCT 38.8 03/23/2024   MCV 97.5 03/23/2024   PLT 446 (H) 03/23/2024   Lab Results  Component Value Date   GLUCOSE 78 03/23/2024   CALCIUM  9.0 03/23/2024   NA 140 03/23/2024   K 5.3 (H) 03/23/2024   CO2 22 03/23/2024   CL 106 03/23/2024   BUN 32 (H)  03/23/2024   CREATININE 1.31 (H) 03/23/2024   Lab Results  Component Value Date   ALT 16 03/23/2024   AST 25 03/23/2024   ALKPHOS 106 03/23/2024   BILITOT 0.3 03/23/2024   Lab Results  Component Value Date   INR 1.2 12/18/2023   INR 1.9 (H) 02/02/2023   INR 0.90 08/03/2015   Radiology: DG Chest 2 View Result Date: 03/22/2024 EXAM: 2 VIEW(S) XRAY OF THE CHEST 03/22/2024 11:04:01 PM COMPARISON: 03/03/2024 CLINICAL HISTORY: Shortness of breath, volume overload. FINDINGS: LUNGS AND PLEURA: No focal pulmonary opacity. No pleural effusion. No pneumothorax. HEART AND MEDIASTINUM: Calcification of the aorta. Large esophageal hiatal hernia behind the heart. Heart size and pulmonary vascularity are normal. Mediastinal contours appear intact. BONES AND SOFT TISSUES: Degenerative changes in the spine and shoulders. Compression deformity of a lower thoracic vertebra, unchanged since the prior study. IMPRESSION: 1. No acute cardiopulmonary findings. 2. Large esophageal hiatal hernia. Electronically signed by: Elsie Gravely MD 03/22/2024 11:15 PM EST RP Workstation: [id number]    Assessment and Plan: 89F h/o paroxysmal A-fib  not on anticoagulation due to history of GI bleed, hypertension, history of CVA, Barrett's esophagus, osteoporosis, bilateral knee osteoarthritis, severe protein calorie malnutrition and recent admission from 1/8-1/19 for FTT iso Citrobacter UTI c/b AFRVR (tx with IV, CTX x3 days, PO cardizem /Toprol , and SNF recs but pt/family opted for home instead) who p/w SOB and BLE edema iso AFRVR c/b HFpEF exacerbation.  AFRVR Pt not on OAC due to recurrent GIBs -Cards consulted; apprec eval/recs -IV diltiazem  for now; pt not a candidate for IV amiodarone  given lack of OAC and possible chemical conversion c/b increased CVA risk; will defer to Cards for rate control mgt -MIVF: NS at 75cc/h for now while pt hypotensive on diltiazem  gtt -Resume pta PO cardizem  and toprol  once able  HFpEF exacerbation -Defer IV lasix  for now given ongoing hypotension while o Cardizem ; pt will need IV diuretics prior to d/c; appreciate Cards input  Abnl UA Presumed acute cystitis -IV CTX 1g daily for now -F/u urine cultures, and adjust abx accordingly  FTT Pt admitted recently for FTT and continues to demonstrated inability to care for herself at home; would strongly recommend rehab -PT/OT consulted; apprec eval/recs  Hyperkalemia S/p Lokelma  10mg  x1 in ED -Senna/docusate daily and miralax  daily  -F/u BMP   Advance Care Planning:   Code Status: Full Code   Consults: N/A  Family Communication: Brother, [name]  Severity of Illness: The appropriate patient status for this patient is INPATIENT. Inpatient status is judged to be reasonable and necessary in order to provide the required intensity of service to ensure the patient's safety. The patient's presenting symptoms, physical exam findings, and initial radiographic and laboratory data in the context of their chronic comorbidities is felt to place them at high risk for further clinical deterioration. Furthermore, it is not anticipated that the patient will be  medically stable for discharge from the hospital within 2 midnights of admission.   * I certify that at the point of admission it is my clinical judgment that the patient will require inpatient hospital care spanning beyond 2 midnights from the point of admission due to high intensity of service, high risk for further deterioration and high frequency of surveillance required.*   ------- I spent 55 minutes reviewing previous notes, at the bedside counseling/discussing the treatment plan, and performing clinical documentation.  Author: Marsha Ada, MD 03/23/2024 12:05 PM  For on call review [email or web address].      [1]  Allergies Allergen Reactions   Pine Shortness Of Breath   Pollen Extract Shortness Of Breath   Caduet [Amlodipine -Atorvastatin] Other (See Comments)    Unknown reaction   "

## 2024-03-24 ENCOUNTER — Inpatient Hospital Stay (HOSPITAL_COMMUNITY)

## 2024-03-24 DIAGNOSIS — I5033 Acute on chronic diastolic (congestive) heart failure: Secondary | ICD-10-CM | POA: Diagnosis not present

## 2024-03-24 LAB — CBC
HCT: 36.8 % (ref 36.0–46.0)
Hemoglobin: 11.9 g/dL — ABNORMAL LOW (ref 12.0–15.0)
MCH: 31.2 pg (ref 26.0–34.0)
MCHC: 32.3 g/dL (ref 30.0–36.0)
MCV: 96.3 fL (ref 80.0–100.0)
Platelets: 394 10*3/uL (ref 150–400)
RBC: 3.82 MIL/uL — ABNORMAL LOW (ref 3.87–5.11)
RDW: 18.3 % — ABNORMAL HIGH (ref 11.5–15.5)
WBC: 8 10*3/uL (ref 4.0–10.5)
nRBC: 0 % (ref 0.0–0.2)

## 2024-03-24 LAB — BASIC METABOLIC PANEL WITH GFR
Anion gap: 12 (ref 5–15)
BUN: 35 mg/dL — ABNORMAL HIGH (ref 8–23)
CO2: 24 mmol/L (ref 22–32)
Calcium: 8.1 mg/dL — ABNORMAL LOW (ref 8.9–10.3)
Chloride: 104 mmol/L (ref 98–111)
Creatinine, Ser: 1.61 mg/dL — ABNORMAL HIGH (ref 0.44–1.00)
GFR, Estimated: 33 mL/min — ABNORMAL LOW
Glucose, Bld: 96 mg/dL (ref 70–99)
Potassium: 4 mmol/L (ref 3.5–5.1)
Sodium: 139 mmol/L (ref 135–145)

## 2024-03-24 LAB — ECHOCARDIOGRAM LIMITED
Area-P 1/2: 4.31 cm2
S' Lateral: 3 cm

## 2024-03-24 LAB — T3, FREE: T3, Free: 2.9 pg/mL (ref 2.0–4.4)

## 2024-03-24 MED ORDER — QUETIAPINE FUMARATE 25 MG PO TABS
25.0000 mg | ORAL_TABLET | Freq: Every day | ORAL | Status: DC
  Administered 2024-03-24 – 2024-03-31 (×8): 25 mg via ORAL
  Filled 2024-03-24 (×8): qty 1

## 2024-03-24 MED ORDER — MIDODRINE HCL 5 MG PO TABS
5.0000 mg | ORAL_TABLET | Freq: Two times a day (BID) | ORAL | Status: DC
  Administered 2024-03-24 – 2024-03-25 (×2): 5 mg via ORAL
  Filled 2024-03-24 (×2): qty 1

## 2024-03-24 MED ORDER — HALOPERIDOL LACTATE 5 MG/ML IJ SOLN
1.0000 mg | Freq: Four times a day (QID) | INTRAMUSCULAR | Status: DC | PRN
  Administered 2024-03-24: 1 mg via INTRAMUSCULAR
  Filled 2024-03-24: qty 1

## 2024-03-24 MED ORDER — FUROSEMIDE 10 MG/ML IJ SOLN
20.0000 mg | Freq: Two times a day (BID) | INTRAMUSCULAR | Status: DC
  Administered 2024-03-24 – 2024-03-26 (×5): 20 mg via INTRAVENOUS
  Filled 2024-03-24: qty 4
  Filled 2024-03-24 (×2): qty 2
  Filled 2024-03-24: qty 4
  Filled 2024-03-24: qty 2

## 2024-03-24 NOTE — Plan of Care (Signed)

## 2024-03-24 NOTE — Progress Notes (Signed)
 PT Cancellation Note  Patient Details Name: Monica Conway MRN: [id number] DOB: 1945-06-15   Cancelled Treatment:    Reason Eval/Treat Not Completed: Other (comment) (Pt adamantly refusing all mobility, reporting, No, no, no when asked if she would like to sit on the EOB for breakfast, then saying, Can you just leave me alone when therapist informed pt MD would like her to mobilize. Will attempt follow up later.)  Leontine Hilt DPT Acute Rehab Services [phone] Prefer contact via chat   [name] 03/24/2024, 7:46 AM

## 2024-03-24 NOTE — Progress Notes (Signed)
 OT Cancellation Note  Patient Details Name: Monica Conway MRN: [id number] DOB: 1945/11/09   Cancelled Treatment:    Reason Eval/Treat Not Completed: Other (comment);Patient declined, no reason specified (Pt yealling no to everything. Even when asking if she wants to rest will reattempt as time permits.) Monica Conway  Acute Rehab Services  [phone] office number   Monica Conway 03/24/2024, 7:46 AM

## 2024-03-24 NOTE — Progress Notes (Signed)
 "  Progress Note  Patient Name: Monica Conway Date of Encounter: 03/24/2024 [facility] Cardiologist: Shelda Bruckner, MD   Interval Summary   Patient appears more alert and aware today. States that she is feeling somewhat better, having good UOP despite holding diuretic. Still with extreme tenderness of lower extremities but she states that it is a little bit better today. Denies any chest pain, dyspnea, or palpitations at this time  Vital Signs Vitals:   03/24/24 0230 03/24/24 0300 03/24/24 0330 03/24/24 0400  BP: (!) 85/65 (!) 69/39 (!) 117/98   Pulse: 71 70    Resp: 20 20 19 16   Temp:  97.8 F (36.6 C)    TempSrc:  Oral    SpO2: 92% 94%    Weight:  38 kg    Height:  5' 2 (1.575 m)      Intake/Output Summary (Last 24 hours) at 03/24/2024 1120 Last data filed at 03/24/2024 1057 Gross per 24 hour  Intake 677.56 ml  Output 350 ml  Net 327.56 ml      03/24/2024    3:00 AM 03/23/2024   11:36 AM 03/15/2024    5:00 AM  Last 3 Weights  Weight (lbs) 83 lb 12.4 oz 87 lb 11.9 oz 86 lb 13.8 oz  Weight (kg) 38 kg 39.8 kg 39.4 kg      Telemetry/ECG  Sinus rhythm, rates in the 70s - Personally Reviewed  Physical Exam  GEN: Frail chronically ill-appearing elderly female, in contracted position in bed, in no acute distress. Mentation improved compared to yesterday Neck: Unable to assess due pt in fixed position and unable to move without pain Cardiac: RRR, 3/6 systolic murmur at LUSB Respiratory: CTA on right, unable to assess left due to pt pain with repositioning GI: Soft, nontender MS: Limited exam due to extreme tenderness to touch, overtly bilaterally erythematous and edematous with weeping blisters, warm to touch  Assessment & Plan  Acute on chronic HFpEF Presented with acute onset bilateral leg swelling and pain proBNP 4,058 No pleural effusion on CXR S/p IV Lasix  20 mg x2, no I/O documented in chart Most recent echo 1/9 showed EF 55-60%, normal RV,  mild AV stenosis (AVA 1.20 cm2, mean gradient 9.0 mmHg, Vmax 2.28 m/s) Echo this admission pending, postponed yesterday due to rapid HR Assessment of volume status limited due to pt in fixed position and pain with repositioning. However, by visual inspection bilateral lower extremities overtly erythematous and edematous with weeping blisters. Will likely need additional diuresis, however, cautious of rising creatinine and progressive hypotension so would hold diuresis at this time. Possible third spacing as well with albumin 2.4 prior to diuresis >> 3.1 after Will CTM BP and reassess with BMP this AM   Paroxysmal atrial fibrillation with RVR History of CVA in 12/2022 thought to be cardioembolic/secondary to Afib. Unable to anticoagulate due to history of GI bleed and frequent falls Home meds: Diltiazem  and metoprolol  succinate, although patient is unable to confirm at what dose and frequency she has been taking these medications ED EKG showed ?atrial flutter vs atrial tachycardia with significant artifact, rate 105 bpm TSH 5.24, free T3 pending Normal K, mag Started on diltiazem  drip yesterday morning, stopped shortly thereafter due to hypotension. Given IV digoxin  0.25 mg x1 but no improvement in rate. Options limited due to hypotension. Amiodarone  not ideal given that pt is not anticoagulated, however no alternatives with progressive hypotension   Amiodarone  drip started yesterday, telemetry shows sinus rhythm today  Per primary Abnormal UA/presumed acute cystitis Bilateral knee osteoarthritis Osteoporosis FTT  For questions or updates, please contact [facility] Please consult [email or web address] for contact info under         Signed, Owen MARLA Daniels, PA-C   "

## 2024-03-24 NOTE — Evaluation (Signed)
 Physical Therapy Evaluation Patient Details Name: Monica Conway MRN: [id number] DOB: [birth year] Today's Date: 03/24/2024  History of Present Illness  Pt is a 79 y.o. female presents to [facility] 03/22/24 due to SOB and BLE edema.Pt admitted due to acute on chronic diastolic heart failure. Pt had a recent admission from 1/8-1/19. PMHx: paroxysmal A-fib not on anticoagulation due to history of GI bleed, hypertension, history of CVA, Barrett's esophagus, osteoporosis, bilateral knee osteoarthritis, severe protein calorie malnutrition   Clinical Impression  Unsure of pt's prior level of function at this time as no family members are present. Pt presents to evaluation with deficits in mobility, strength, power, pain, activity tolerance, balance, and cognition. Pt able to roll L and R for peri-care needs with maximal physical assistance of 1, and then completed sidelying to and from sit with maximal physical assistance of 2. Further mobility deferred due to significant drop in blood pressure. Pt left with bed in chair position, with pillows positioned between knees, under distal calves, and under L ischial tube to reduce risk of pressure sores. PT will continue to treat pt while she is admitted. Patient will benefit from continued inpatient follow up therapy, <3 hours/day.         If plan is discharge home, recommend the following: Two people to help with walking and/or transfers;Two people to help with bathing/dressing/bathroom;Assistance with cooking/housework;Direct supervision/assist for medications management;Assistance with feeding;Direct supervision/assist for financial management;Help with stairs or ramp for entrance;Assist for transportation;Supervision due to cognitive status   Can travel by private vehicle   No    Equipment Recommendations Hospital bed;Hoyer lift  Recommendations for Other Services       Functional Status Assessment Patient has had a recent decline in their functional status  and/or demonstrates limited ability to make significant improvements in function in a reasonable and predictable amount of time     Precautions / Restrictions Precautions Precautions: Fall Recall of Precautions/Restrictions: Impaired Restrictions Weight Bearing Restrictions Per Provider Order: No      Mobility  Bed Mobility Overal bed mobility: Needs Assistance Bed Mobility: Rolling, Sidelying to Sit, Sit to Supine Rolling: Max assist, Used rails Sidelying to sit: Max assist, +2 for physical assistance, HOB elevated   Sit to supine: Max assist, +2 for physical assistance   General bed mobility comments: Pt able to roll L and R for peri-care needs reaching across for opposite bed railing and requiring max A via bed pad. Pt then completed sidelying to sit with max A via bed pad for both trunk and BLE management. For sit to supine, pt requires maximal physical assitance via bed pad for trunk and BLE management. Pt positioned with pillows between knees, under distal calf, and under L sacrum.    Transfers                   General transfer comment: deferred at this time due to significant drop in BP    Ambulation/Gait                  Stairs            Wheelchair Mobility     Tilt Bed    Modified Rankin (Stroke Patients Only)       Balance Overall balance assessment: Needs assistance Sitting-balance support: Bilateral upper extremity supported, Feet supported Sitting balance-Leahy Scale: Poor Sitting balance - Comments: reliant on external support of therapist to remain upright with maximal physical assistance of 1 Postural control: Posterior lean  Pertinent Vitals/Pain Pain Assessment Pain Assessment: Faces Faces Pain Scale: Hurts little more Pain Location: generalized Pain Descriptors / Indicators: Discomfort, Grimacing, Guarding, Tender Pain Intervention(s): Limited activity within patient's  tolerance, Monitored during session    Home Living Family/patient expects to be discharged to:: Private residence Living Arrangements: Alone Available Help at Discharge:  (per last admission,  pt has caretakers 7 days per week, 5 hours per day) Type of Home: House Home Access: Ramped entrance       Home Layout: One level Home Equipment: Agricultural Consultant (2 wheels);Wheelchair - manual;Shower seat;Grab bars - tub/shower;Hand held shower head;BSC/3in1 Additional Comments: above info per recent admission 1 month    Prior Function Prior Level of Function : Patient poor historian/Family not available                     Extremity/Trunk Assessment   Upper Extremity Assessment Upper Extremity Assessment: Defer to OT evaluation    Lower Extremity Assessment Lower Extremity Assessment: Generalized weakness;RLE deficits/detail;LLE deficits/detail RLE Deficits / Details: knee flexion contracture LLE Deficits / Details: knee flexion contracture    Cervical / Trunk Assessment Cervical / Trunk Assessment: Kyphotic;Other exceptions (hx of back pain)  Communication   Communication Communication: Impaired Factors Affecting Communication: Hearing impaired    Cognition Arousal: Lethargic Behavior During Therapy: Flat affect   PT - Cognitive impairments: No family/caregiver present to determine baseline, Memory, Initiation, Sequencing, Problem solving, Safety/Judgement                       PT - Cognition Comments: Pt crying out, Help, help and then  No, no, no periodically throughout session. Pt continues to have slowed processing and require increased time for all mobility tasks. Following commands: Impaired Following commands impaired: Follows one step commands inconsistently     Cueing Cueing Techniques: Verbal cues, Gestural cues, Tactile cues     General Comments General comments (skin integrity, edema, etc.): BP Supine: 95/76, BP Seated: 69/44, BP supine:  85/55, BP with HOB elevated and bed in chair position: 102/58    Exercises     Assessment/Plan    PT Assessment Patient needs continued PT services  PT Problem List Decreased strength;Decreased range of motion;Decreased activity tolerance;Decreased balance;Decreased mobility;Decreased knowledge of use of DME;Decreased safety awareness;Pain       PT Treatment Interventions DME instruction;Gait training;Functional mobility training;Therapeutic activities;Therapeutic exercise;Balance training;Patient/family education;Wheelchair mobility training;Manual techniques;Modalities    PT Goals (Current goals can be found in the Care Plan section)  Acute Rehab PT Goals Patient Stated Goal: none stated this date PT Goal Formulation: With patient Time For Goal Achievement: 04/07/24 Potential to Achieve Goals: Poor    Frequency Min 1X/week     Co-evaluation PT/OT/SLP Co-Evaluation/Treatment: Yes Reason for Co-Treatment: Necessary to address cognition/behavior during functional activity;For patient/therapist safety;To address functional/ADL transfers           AM-PAC PT 6 Clicks Mobility  Outcome Measure Help needed turning from your back to your side while in a flat bed without using bedrails?: A Lot Help needed moving from lying on your back to sitting on the side of a flat bed without using bedrails?: Total Help needed moving to and from a bed to a chair (including a wheelchair)?: Total Help needed standing up from a chair using your arms (e.g., wheelchair or bedside chair)?: Total Help needed to walk in hospital room?: Total Help needed climbing 3-5 steps with a railing? : Total 6 Click Score: 7  End of Session   Activity Tolerance: Other (comment) (orthostatic hypotension and cognition) Patient left: in bed;with call bell/phone within reach;with bed alarm set (bed in chair position) Nurse Communication: Mobility status;Other (comment) (orthostatics, early development of bed  sore under L ischial tube) PT Visit Diagnosis: Other abnormalities of gait and mobility (R26.89);Muscle weakness (generalized) (M62.81);Adult, failure to thrive (R62.7);History of falling (Z91.81)    Time: 9070-9047 PT Time Calculation (min) (ACUTE ONLY): 23 min   Charges:   PT Evaluation $PT Eval Moderate Complexity: 1 Mod   PT General Charges $$ ACUTE PT VISIT: 1 Visit         Leontine Hilt DPT Acute Rehab Services [phone] Prefer contact via chat   Kaziyah Parkison B Adarrius Graeff 03/24/2024, 10:14 AM

## 2024-03-24 NOTE — Evaluation (Signed)
 Occupational Therapy Evaluation Patient Details Name: Monica Conway MRN: [id number] DOB: Aug 09, 1945 Today's Date: 03/24/2024   History of Present Illness   Pt is a 79 y.o. female presents to [facility] 03/22/24 due to SOB and BLE edema.Pt admitted due to acute on chronic diastolic heart failure. Pt had a recent admission from 1/8-1/19. PMHx: paroxysmal A-fib not on anticoagulation due to history of GI bleed, hypertension, history of CVA, Barrett's esophagus, osteoporosis, bilateral knee osteoarthritis, severe protein calorie malnutrition     Clinical Impressions Pt presented yelling out help and presented no family members in the room. She required max assist with rolling while completion of peri care with total assist. Attempted to have pt sit at EOB with MAX x2 assist but then was placed back into supine due to BP (nursing aware). Pt then reposition into chair position in bed. At this time Acute Occupational Therapy to follow. Patient will benefit from continued inpatient follow up therapy, <3 hours/day.  BP Supine: 95/76, BP Seated: 69/44, BP supine: 85/55, BP with HOB elevated and bed in chair position: 102/58    If plan is discharge home, recommend the following:   Two people to help with walking and/or transfers;Two people to help with bathing/dressing/bathroom;Assistance with cooking/housework;Assistance with feeding;Direct supervision/assist for medications management;Direct supervision/assist for financial management;Assist for transportation;Help with stairs or ramp for entrance;Supervision due to cognitive status     Functional Status Assessment   Patient has had a recent decline in their functional status and demonstrates the ability to make significant improvements in function in a reasonable and predictable amount of time.     Equipment Recommendations   Other (comment) (TBD at next venue)     Recommendations for Other Services         Precautions/Restrictions    Precautions Precautions: Fall Recall of Precautions/Restrictions: Impaired Restrictions Weight Bearing Restrictions Per Provider Order: No     Mobility Bed Mobility Overal bed mobility: Needs Assistance Bed Mobility: Supine to Sit, Sit to Supine Rolling: Max assist, Used rails Sidelying to sit: Max assist, +2 for physical assistance, HOB elevated Supine to sit: Max assist, +2 for physical assistance, HOB elevated Sit to supine: Max assist, +2 for physical assistance   General bed mobility comments: Pt able to roll L and R for peri-care needs reaching across for opposite bed railing and requiring max A via bed pad. Pt then completed sidelying to sit with max A via bed pad for both trunk and BLE management. For sit to supine, pt requires maximal physical assitance via bed pad for trunk and BLE management. Pt positioned with pillows between knees, under distal calf, and under L sacrum.    Transfers                          Balance Overall balance assessment: Needs assistance Sitting-balance support: Bilateral upper extremity supported, Feet supported Sitting balance-Leahy Scale: Poor Sitting balance - Comments: reliant on external support of therapist to remain upright with maximal physical assistance of 1 Postural control: Posterior lean                                 ADL either performed or assessed with clinical judgement   ADL Overall ADL's : Needs assistance/impaired   Eating/Feeding Details (indicate cue type and reason): refusing Grooming: Total assistance   Upper Body Bathing: Total assistance   Lower Body Bathing: Total assistance;+2 for  physical assistance;+2 for safety/equipment   Upper Body Dressing : Total assistance   Lower Body Dressing: Total assistance;+2 for physical assistance;+2 for safety/equipment   Toilet Transfer: Total assistance;+2 for physical assistance;+2 for safety/equipment   Toileting- Clothing Manipulation and  Hygiene: Total assistance;+2 for physical assistance;+2 for safety/equipment   Tub/ Shower Transfer: Total assistance;+2 for physical assistance;+2 for safety/equipment   Functional mobility during ADLs: Total assistance;+2 for physical assistance;+2 for safety/equipment       Vision         Perception         Praxis         Pertinent Vitals/Pain Pain Assessment Pain Assessment: Faces Faces Pain Scale: Hurts little more Breathing: normal Negative Vocalization: none Facial Expression: smiling or inexpressive Consolability: distracted or reassured by voice/touch Pain Location: generalized Pain Descriptors / Indicators: Discomfort, Grimacing, Guarding, Tender Pain Intervention(s): Limited activity within patient's tolerance, Monitored during session, Repositioned     Extremity/Trunk Assessment Upper Extremity Assessment Upper Extremity Assessment: Difficult to assess due to impaired cognition   Lower Extremity Assessment Lower Extremity Assessment: Defer to PT evaluation RLE Deficits / Details: knee flexion contracture LLE Deficits / Details: knee flexion contracture   Cervical / Trunk Assessment Cervical / Trunk Assessment: Kyphotic;Other exceptions (hx of back pain)   Communication Communication Communication: Impaired Factors Affecting Communication: Hearing impaired   Cognition Arousal: Lethargic Behavior During Therapy: Flat affect Cognition: Cognition impaired   Orientation impairments: Place, Time, Situation Awareness: Intellectual awareness impaired, Online awareness impaired Memory impairment (select all impairments): Short-term memory, Working civil service fast streamer, Non-declarative long-term memory, Geneticist, Molecular long-term memory Attention impairment (select first level of impairment): Sustained attention Executive functioning impairment (select all impairments): Initiation, Organization, Sequencing, Reasoning, Problem solving                   Following  commands: Impaired Following commands impaired: Follows one step commands inconsistently     Cueing  General Comments   Cueing Techniques: Verbal cues;Gestural cues;Tactile cues  BP Supine: 95/76, BP Seated: 69/44, BP supine: 85/55, BP with HOB elevated and bed in chair position: 102/58   Exercises     Shoulder Instructions      Home Living Family/patient expects to be discharged to:: Private residence Living Arrangements: Alone (per chart has a caregiver  7 days a week x 5 hours) Available Help at Discharge: Other (Comment) Type of Home: House Home Access: Ramped entrance     Home Layout: One level     Bathroom Shower/Tub: Chief Strategy Officer: Handicapped height Bathroom Accessibility: Yes How Accessible: Accessible via walker Home Equipment: Rolling Walker (2 wheels);Wheelchair - manual;Shower seat;Grab bars - tub/shower;Hand held shower head;BSC/3in1   Additional Comments: above info per recent admission 1 month      Prior Functioning/Environment Prior Level of Function : Patient poor historian/Family not available             Mobility Comments: Pt rpeorts he uses both walker and WC for mobilty. ADLs Comments: Pt inconsistent in her report, reporting caregiver helps her with all ADLs and also that she doesn't need assistance for ADLs.    OT Problem List: Decreased strength;Decreased range of motion;Decreased activity tolerance;Impaired balance (sitting and/or standing);Decreased cognition;Decreased safety awareness;Decreased knowledge of use of DME or AE;Decreased knowledge of precautions;Cardiopulmonary status limiting activity   OT Treatment/Interventions: Self-care/ADL training;Therapeutic exercise;Energy conservation;DME and/or AE instruction;Therapeutic activities;Cognitive remediation/compensation;Patient/family education;Balance training      OT Goals(Current goals can be found in the care plan section)  Acute Rehab OT Goals Patient  Stated Goal: none OT Goal Formulation: Patient unable to participate in goal setting Time For Goal Achievement: 04/07/24 Potential to Achieve Goals: Fair   OT Frequency:  Min 2X/week    Co-evaluation PT/OT/SLP Co-Evaluation/Treatment: Yes Reason for Co-Treatment: Necessary to address cognition/behavior during functional activity;For patient/therapist safety;To address functional/ADL transfers   OT goals addressed during session: ADL's and self-care      AM-PAC OT 6 Clicks Daily Activity     Outcome Measure Help from another person eating meals?: Total Help from another person taking care of personal grooming?: Total Help from another person toileting, which includes using toliet, bedpan, or urinal?: Total Help from another person bathing (including washing, rinsing, drying)?: Total Help from another person to put on and taking off regular upper body clothing?: Total Help from another person to put on and taking off regular lower body clothing?: Total 6 Click Score: 6   End of Session Nurse Communication: Mobility status (agitation)  Activity Tolerance: Other (comment) (agitated) Patient left: in bed;with call bell/phone within reach;with bed alarm set  OT Visit Diagnosis: Muscle weakness (generalized) (M62.81);Unsteadiness on feet (R26.81);Other abnormalities of gait and mobility (R26.89)                Time: 9070-9047 OT Time Calculation (min): 23 min Charges:  OT General Charges $OT Visit: 1 Visit OT Evaluation $OT Eval Moderate Complexity: 1 Mod  Cynethia Schindler K OTR/L  Acute Rehab Services  [phone] office number   Warrick Berber 03/24/2024, 10:26 AM

## 2024-03-24 NOTE — NC FL2 (Signed)
 " [place]  MEDICAID FL2 LEVEL OF CARE FORM     IDENTIFICATION  Patient Name: Monica Conway Birthdate: [birth year] Sex: female Admission Date (Current Location): 03/22/2024  [facility] and [place] Number:  Producer, Television/film/video and Address:  [facility] [place], [address] [place], [place] [address]      Provider Number: [id number]  Attending Physician Name and Address:  Fairy Frames, MD  Relative Name and Phone Number:  [name], Emergency Contact [phone] [facility])    Current Level of Care: Hospital Recommended Level of Care: Skilled Nursing Facility Prior Approval Number:    Date Approved/Denied:   PASRR Number: [id number]  Discharge Plan: SNF    Current Diagnoses: Patient Active Problem List   Diagnosis Date Noted   Acute on chronic diastolic heart failure (HCC) 03/23/2024   Hypotension 03/23/2024   Goals of care, counseling/discussion 03/23/2024   Protein-calorie malnutrition, severe 03/10/2024   Acute on chronic congestive heart failure (HCC) 03/04/2024   Failure to thrive in adult 03/03/2024   Cognitive impairment 12/29/2023   Lumbar compression fracture (HCC) 10/25/2023   History of stroke - 01-2023. felt to be due to afib. 04/08/2023   Gastric ulcer 04/08/2023   Proctocolitis 04/08/2023   AKI (acute kidney injury) 04/08/2023   Delirium due to multiple etiologies, acute, hyperactive 04/08/2023   Anisocoria - chronic. 04/08/2023   Acute GI bleeding 04/03/2023   Paroxysmal atrial fibrillation with RVR (HCC) 01/26/2023   Essential hypertension    Osteoporosis    E. coli UTI 02/05/2014    Orientation RESPIRATION BLADDER Height & Weight     Place, Situation  Normal Incontinent, External catheter Weight: 83 lb 12.4 oz (38 kg) Height:  5' 2 (157.5 cm)  BEHAVIORAL SYMPTOMS/MOOD NEUROLOGICAL BOWEL NUTRITION STATUS      Continent Diet (Please see dc summary)  AMBULATORY STATUS COMMUNICATION OF NEEDS Skin   Extensive  Assist Verbally Normal                       Personal Care Assistance Level of Assistance  Bathing, Feeding, Dressing Bathing Assistance: Maximum assistance Feeding assistance: Limited assistance Dressing Assistance: Maximum assistance     Functional Limitations Info  Sight, Hearing, Speech Sight Info: Adequate Hearing Info: Impaired Speech Info: Adequate    SPECIAL CARE FACTORS FREQUENCY  OT (By licensed OT), PT (By licensed PT)     PT Frequency: 5x week OT Frequency: 5x week            Contractures Contractures Info: Not present    Additional Factors Info  Code Status, Allergies, Psychotropic Code Status Info: DNR limited Allergies Info: Pine, Pollen Extract, Caduet (Amlodipine -atorvastatin) Psychotropic Info: seroquel          Current Medications (03/24/2024):  This is the current hospital active medication list Current Facility-Administered Medications  Medication Dose Route Frequency Provider Last Rate Last Admin   acetaminophen  (TYLENOL ) tablet 650 mg  650 mg Oral Q6H PRN Howerter, Justin B, DO   650 mg at 03/23/24 1448   Or   acetaminophen  (TYLENOL ) suppository 650 mg  650 mg Rectal Q6H PRN Howerter, Justin B, DO       amiodarone  (NEXTERONE  PREMIX) 360-4.14 MG/200ML-% (1.8 mg/mL) IV infusion  30 mg/hr Intravenous Continuous Georgina Basket, MD 16.67 mL/hr at 03/24/24 0842 30 mg/hr at 03/24/24 0842   cefTRIAXone  (ROCEPHIN ) 1 g in sodium chloride  0.9 % 100 mL IVPB  1 g Intravenous Q24H Georgina Basket, MD 200 mL/hr at 03/24/24  1015 1 g at 03/24/24 1015   diltiazem  (CARDIZEM ) 125 mg in dextrose  5% 125 mL (1 mg/mL) infusion  5-15 mg/hr Intravenous Titrated Georgina Basket, MD   Stopped at 03/23/24 1442   enoxaparin  (LOVENOX ) injection 30 mg  30 mg Subcutaneous Q24H Georgina Basket, MD   30 mg at 03/23/24 2215   feeding supplement (ENSURE PLUS HIGH PROTEIN) liquid 237 mL  237 mL Oral BID BM Georgina Basket, MD   237 mL at 03/24/24 1416   furosemide  (LASIX ) injection 20 mg   20 mg Intravenous BID Joseph, Preetha, MD   20 mg at 03/24/24 1416   magnesium  oxide (MAG-OX) tablet 400 mg  400 mg Oral Daily Georgina Basket, MD   400 mg at 03/24/24 1000   melatonin tablet 3 mg  3 mg Oral QHS PRN Howerter, Justin B, DO   3 mg at 03/23/24 2203   midodrine  (PROAMATINE ) tablet 5 mg  5 mg Oral BID WC Joseph, Preetha, MD   5 mg at 03/24/24 1416   ondansetron  (ZOFRAN ) injection 4 mg  4 mg Intravenous Q6H PRN Howerter, Justin B, DO       oxyCODONE -acetaminophen  (PERCOCET/ROXICET) 5-325 MG per tablet 1 tablet  1 tablet Oral Q6H PRN Howerter, Justin B, DO   1 tablet at 03/23/24 2202   pantoprazole  (PROTONIX ) EC tablet 40 mg  40 mg Oral BID Moore, Willie, MD   40 mg at 03/24/24 1001   polyethylene glycol (MIRALAX  / GLYCOLAX ) packet 17 g  17 g Oral Daily Georgina Basket, MD       QUEtiapine  (SEROQUEL ) tablet 25 mg  25 mg Oral QHS Fairy Frames, MD       senna-docusate (Senokot-S) tablet 1 tablet  1 tablet Oral QHS Georgina Basket, MD         Discharge Medications: Please see discharge summary for a list of discharge medications.  Relevant Imaging Results:  Relevant Lab Results:   Additional Information [id number]  Christiona Siddique C Cuthbert Turton, LCSWA     "

## 2024-03-24 NOTE — Progress Notes (Signed)
 " PROGRESS NOTE    Monica Conway  FMW:[id number] DOB: 1945/05/16 DOA: 03/22/2024 [facility]CP: [facility]  [facility] debilitated [name] with history of CVA, recurrent GI bleeds, paroxysmal A-fib not on anticoagulation, osteoarthritis, severe malnutrition, recent admission with A-fib RVR delirium, Citrobacter UTI, seen by palliative care, initially agreed to SNF and then patient insisted on discharge home with additional home care services. - Back in the ED 1/27 with leg swelling, in the ER tachycardic creatinine 1.26, pro proBNP 4790, UA with pyuria bacteriuria   Subjective: Refused labs and telemetry this morning  Assessment and Plan:  Acute on chronic diastolic CHF Hypoalbuminemia, third spacing - Recent echo 1/9 noted EF 55-60%, normal RV - Volume up mildly this admission, compounded by third spacing - Cards following, wide fluctuation in blood pressure, significantly low overnight, now improving - Add midodrine  and low-dose IV Lasix  today  Paroxysmal A-fib with RVR - Briefly used Cardizem  gtt. yesterday, stopped for hypotension, then started on Amio gtt. - Appears to be in sinus rhythm this morning - Not on anticoagulation due to history of GI bleed and frequent falls - Limited options  Abnormal UA Recent Citrobacter UTI, delirium - Mild leukocytosis on admission, UA is abnormal, started on ceftriaxone , follow-up urine cultures - Very poor historian, patient unable to provide any meaningful history regarding urinary symptoms  Hyperkalemia -Resolved  Failure to thrive Severe malnutrition Cognitive decline - Do not anticipate significant improvement in her overall condition, palliative reconsulted  - Continue Seroquel  -Overall prognosis is poor, called and discussed this with her brother [name], he was agreeable to start palliative care discussions again including hospice etc.  DVT prophylaxis: lovenox  Code Status: DNR Family Communication: NO family at bedside, called  and discussed with brother [name] Disposition Plan: To be determined  Consultants:    Procedures:   Antimicrobials:    Objective: Vitals:   03/24/24 0230 03/24/24 0300 03/24/24 0330 03/24/24 0400  BP: (!) 85/65 (!) 69/39 (!) 117/98   Pulse: 71 70    Resp: 20 20 19 16   Temp:  97.8 F (36.6 C)    TempSrc:  Oral    SpO2: 92% 94%    Weight:  38 kg    Height:  5' 2 (1.575 m)      Intake/Output Summary (Last 24 hours) at 03/24/2024 1144 Last data filed at 03/24/2024 1057 Gross per 24 hour  Intake 677.56 ml  Output 350 ml  Net 327.56 ml   Filed Weights   03/23/24 1136 03/24/24 0300  Weight: 39.8 kg 38 kg    Examination:  General exam: Frail chronically ill cachectic Respiratory system: Few basilar rales Cardiovascular system: S1 & S2 heard, irregular Abd: nondistended, soft and nontender.Normal bowel sounds heard. Central nervous system: Awake, irritable, wants to be left alone, moves all extremities no localizing signs Extremities: 1-2+ edema, chronic skin changes Skin: As above Psychiatry: Irritable, flat affect    Data Reviewed:   CBC: Recent Labs  Lab 03/22/24 1901 03/23/24 0525 03/24/24 1103  WBC 10.7* 10.8* 8.0  NEUTROABS 8.0* 8.0*  --   HGB 13.3 12.4 11.9*  HCT 41.2 38.8 36.8  MCV 98.1 97.5 96.3  PLT 493* 446* 394   Basic Metabolic Panel: Recent Labs  Lab 03/22/24 1901 03/23/24 0525  NA 140 140  K 5.1 5.3*  CL 105 106  CO2 23 22  GLUCOSE 86 78  BUN 34* 32*  CREATININE 1.26* 1.31*  CALCIUM  9.1 9.0  MG  --  1.8  GFR: Estimated Creatinine Clearance: 21.6 mL/min (A) (by C-G formula based on SCr of 1.31 mg/dL (H)). Liver Function Tests: Recent Labs  Lab 03/22/24 1901 03/23/24 0525  AST 28 25  ALT 19 16  ALKPHOS 108 106  BILITOT 0.4 0.3  PROT 7.5 6.9  ALBUMIN 3.3* 3.1*   No results for input(s): LIPASE, AMYLASE in the last 168 hours. No results for input(s): AMMONIA in the last 168 hours. Coagulation Profile: No  results for input(s): INR, PROTIME in the last 168 hours. Cardiac Enzymes: No results for input(s): CKTOTAL, CKMB, CKMBINDEX, TROPONINI in the last 168 hours. BNP (last 3 results) Recent Labs    03/03/24 1817 03/22/24 1901 03/23/24 0525  PROBNP 3,427.0* 4,058.0* 4,790.0*   HbA1C: No results for input(s): HGBA1C in the last 72 hours. CBG: No results for input(s): GLUCAP in the last 168 hours. Lipid Profile: No results for input(s): CHOL, HDL, LDLCALC, TRIG, CHOLHDL, LDLDIRECT in the last 72 hours. Thyroid Function Tests: Recent Labs    03/22/24 1901 03/22/24 2316  TSH 5.240*  --   T3FREE  --  2.9   Anemia Panel: No results for input(s): VITAMINB12, FOLATE, FERRITIN, TIBC, IRON , RETICCTPCT in the last 72 hours. Urine analysis:    Component Value Date/Time   COLORURINE YELLOW 03/22/2024 2116   APPEARANCEUR CLEAR 03/22/2024 2116   LABSPEC 1.017 03/22/2024 2116   PHURINE 7.0 03/22/2024 2116   GLUCOSEU NEGATIVE 03/22/2024 2116   HGBUR NEGATIVE 03/22/2024 2116   BILIRUBINUR NEGATIVE 03/22/2024 2116   KETONESUR NEGATIVE 03/22/2024 2116   PROTEINUR 30 (A) 03/22/2024 2116   UROBILINOGEN 0.2 02/04/2014 2020   NITRITE POSITIVE (A) 03/22/2024 2116   LEUKOCYTESUR TRACE (A) 03/22/2024 2116   Sepsis Labs: @LABRCNTIP (procalcitonin:4,lacticidven:4)  ) Recent Results (from the past 240 hours)  Urine Culture     Status: Abnormal (Preliminary result)   Collection Time: 03/22/24  9:16 PM   Specimen: Urine, Random  Result Value Ref Range Status   Specimen Description URINE, RANDOM  Final   Special Requests NONE Reflexed from [id number]  Final   Culture (A)  Final    >=100,000 COLONIES/mL STAPHYLOCOCCUS HAEMOLYTICUS SUSCEPTIBILITIES TO FOLLOW Performed at [facility], [address], [place], [place] [address]    Report Status PENDING  Incomplete     Radiology Studies: DG Chest 2 View Result Date: 03/22/2024 EXAM: 2 VIEW(S) XRAY OF  THE CHEST 03/22/2024 11:04:01 PM COMPARISON: 03/03/2024 CLINICAL HISTORY: Shortness of breath, volume overload. FINDINGS: LUNGS AND PLEURA: No focal pulmonary opacity. No pleural effusion. No pneumothorax. HEART AND MEDIASTINUM: Calcification of the aorta. Large esophageal hiatal hernia behind the heart. Heart size and pulmonary vascularity are normal. Mediastinal contours appear intact. BONES AND SOFT TISSUES: Degenerative changes in the spine and shoulders. Compression deformity of a lower thoracic vertebra, unchanged since the prior study. IMPRESSION: 1. No acute cardiopulmonary findings. 2. Large esophageal hiatal hernia. Electronically signed by: Elsie Gravely MD 03/22/2024 11:15 PM EST RP Workstation: [id number]     Scheduled Meds:  enoxaparin  (LOVENOX ) injection  30 mg Subcutaneous Q24H   feeding supplement  237 mL Oral BID BM   magnesium  oxide  400 mg Oral Daily   pantoprazole   40 mg Oral BID   polyethylene glycol  17 g Oral Daily   QUEtiapine   25 mg Oral Q2000   QUEtiapine   50 mg Oral QHS   senna-docusate  1 tablet Oral QHS   Continuous Infusions:  amiodarone  30 mg/hr (03/24/24 0842)   cefTRIAXone  (ROCEPHIN )  IV 1  g (03/24/24 1015)   diltiazem  (CARDIZEM ) infusion Stopped (03/23/24 1442)     LOS: 1 day    Time spent:    Sigurd Pac, MD [facility]   03/24/2024, 11:44 AM    "

## 2024-03-24 NOTE — Progress Notes (Signed)
 Echocardiogram 2D Echocardiogram has been performed.  Juliene JINNY Rucks 03/24/2024, 4:26 PM

## 2024-03-24 NOTE — TOC Initial Note (Addendum)
 Transition of Care [facility]) - Initial/Assessment Note    Patient Details  Name: Monica Conway MRN: [id number] Date of Birth: 1945-08-19  Transition of Care [facility]) CM/SW Contact:    [name], LCSWA Phone Number: 03/24/2024, 12:56 PM  Clinical Narrative:     CSW spoke to patients brother, [name], about PT rec for STR. [name] is agreeable. [name] is future planning for his sister and inquired about patient being screened for [place] Medicaid in the event patient needs ltc. CSW provided information to [name] and consulted FC for a screening.   [name] would like patient to go to STR at this time. Patient is from home with [facility] caregivers from 9a-8p.   2:31 PM CSW followed up with [name] about patient. Per [name] he is POA and he was informed by MD that patient may need hospice. Palliative has been consulted. CSW advised [name] to bring POA information to hospital the next time he visits. [name] stated ok.   CSW will continue to follow.    Expected Discharge Plan: Skilled Nursing Facility Barriers to Discharge: Continued Medical Work up, SNF Pending bed offer   Patient Goals and CMS Choice Patient states their goals for this hospitalization and ongoing recovery are:: Unable to assess CMS Medicare.gov Compare Post Acute Care list provided to:: Other (Comment Required) Choice offered to / list presented to : Sibling [place] ownership interest in [facility].provided to:: Sibling    Expected Discharge Plan and Services In-house Referral: Clinical Social Work   Post Acute Care Choice: Skilled Nursing Facility Living arrangements for the past 2 months: Single Family Home                                      Prior Living Arrangements/Services Living arrangements for the past 2 months: Single Family Home Lives with:: Self Patient language and need for interpreter reviewed:: Yes Do you feel safe going back to the place where you live?: Yes      Need for  Family Participation in Patient Care: Yes (Comment) Care giver support system in place?: Yes (comment) Current home services: DME, Homehealth aide (Cane, walker, wheelchair ; [facility] aides) Criminal Activity/Legal Involvement Pertinent to Current Situation/Hospitalization: No - Comment as needed  Activities of Daily Living      Permission Sought/Granted Permission sought to share information with : Family Supports Permission granted to share information with : No (Family contact info in chart)  Share Information with NAME: [name] Alfredia  Permission granted to share info w AGENCY: SNFs  Permission granted to share info w Relationship: brother     Emotional Assessment Appearance:: Appears stated age Attitude/Demeanor/Rapport: Unable to Assess Affect (typically observed): Unable to Assess Orientation: : Oriented to Place, Oriented to Situation Alcohol  / Substance Use: Not Applicable Psych Involvement: No (comment)  Admission diagnosis:  Acute on chronic diastolic heart failure (HCC) [I50.33] Acute cystitis without hematuria [N30.00] Hypervolemia, unspecified hypervolemia type [E87.70] Patient Active Problem List   Diagnosis Date Noted   Acute on chronic diastolic heart failure (HCC) 03/23/2024   Hypotension 03/23/2024   Goals of care, counseling/discussion 03/23/2024   Protein-calorie malnutrition, severe 03/10/2024   Acute on chronic congestive heart failure (HCC) 03/04/2024   Failure to thrive in adult 03/03/2024   Cognitive impairment 12/29/2023   Lumbar compression fracture (HCC) 10/25/2023   History of stroke - 01-2023. felt to be due to afib. 04/08/2023  Gastric ulcer 04/08/2023   Proctocolitis 04/08/2023   AKI (acute kidney injury) 04/08/2023   Delirium due to multiple etiologies, acute, hyperactive 04/08/2023   Anisocoria - chronic. 04/08/2023   Acute GI bleeding 04/03/2023   Paroxysmal atrial fibrillation with RVR (HCC) 01/26/2023   Essential hypertension     Osteoporosis    E. coli UTI 02/05/2014   PCP:  [facility]:   [facility] - [facility] [address], [address] [place] [address] Phone: [phone] Fax: [phone]     Social Drivers of Health (SDOH) Social History: SDOH Screenings   Food Insecurity: No Food Insecurity (03/24/2024)  Housing: Low Risk (03/24/2024)  Transportation Needs: Unmet Transportation Needs (03/24/2024)  Utilities: Not At Risk (03/24/2024)  Social Connections: Patient Declined (03/24/2024)  Tobacco Use: Low Risk (03/14/2024)   SDOH Interventions: Food Insecurity Interventions: Intervention Not Indicated Housing Interventions: Intervention Not Indicated Transportation Interventions: Inpatient TOC, Patient Unable to Answer Utilities Interventions: Intervention Not Indicated Social Connections Interventions: Intervention Not Indicated   Readmission Risk Interventions    03/11/2024   10:31 AM 10/26/2023    1:57 PM 04/06/2023    8:44 AM  Readmission Risk Prevention Plan  Post Dischage Appt  Complete   Medication Screening  Complete   Transportation Screening Complete Complete Complete  PCP or Specialist Appt within 3-5 Days   Complete  HRI or Home Care Consult   Complete  Social Work Consult for Recovery Care Planning/Counseling   Complete  Palliative Care Screening   Not Applicable  Medication Review Oceanographer) Complete  Complete  PCP or Specialist appointment within 3-5 days of discharge Complete    HRI or Home Care Consult Complete    SW Recovery Care/Counseling Consult Complete    Palliative Care Screening Complete    Skilled Nursing Facility Complete

## 2024-03-24 NOTE — Progress Notes (Signed)
 Heart Failure Navigator Progress Note  Assessed for Heart & Vascular TOC clinic readiness.  Patient does not meet criteria due to last EF 55-60%, Per Dr. Fairy patient with increase in cognitive impairment, frail and FTT. A palliative consult was placed. No HF TOC. .   Navigator will sign off at this time.   Stephane Haddock, BSN, Scientist, Clinical (histocompatibility And Immunogenetics) Only

## 2024-03-25 DIAGNOSIS — I5033 Acute on chronic diastolic (congestive) heart failure: Secondary | ICD-10-CM | POA: Diagnosis not present

## 2024-03-25 LAB — URINE CULTURE: Culture: >=100000 — AB

## 2024-03-25 LAB — BASIC METABOLIC PANEL WITH GFR
Anion gap: 10 (ref 5–15)
BUN: 32 mg/dL — ABNORMAL HIGH (ref 8–23)
CO2: 25 mmol/L (ref 22–32)
Calcium: 7.5 mg/dL — ABNORMAL LOW (ref 8.9–10.3)
Chloride: 104 mmol/L (ref 98–111)
Creatinine, Ser: 1.44 mg/dL — ABNORMAL HIGH (ref 0.44–1.00)
GFR, Estimated: 37 mL/min — ABNORMAL LOW
Glucose, Bld: 79 mg/dL (ref 70–99)
Potassium: 3.4 mmol/L — ABNORMAL LOW (ref 3.5–5.1)
Sodium: 139 mmol/L (ref 135–145)

## 2024-03-25 LAB — CBC
HCT: 31.8 % — ABNORMAL LOW (ref 36.0–46.0)
Hemoglobin: 10.4 g/dL — ABNORMAL LOW (ref 12.0–15.0)
MCH: 31.1 pg (ref 26.0–34.0)
MCHC: 32.7 g/dL (ref 30.0–36.0)
MCV: 95.2 fL (ref 80.0–100.0)
Platelets: 331 10*3/uL (ref 150–400)
RBC: 3.34 MIL/uL — ABNORMAL LOW (ref 3.87–5.11)
RDW: 18 % — ABNORMAL HIGH (ref 11.5–15.5)
WBC: 8.3 10*3/uL (ref 4.0–10.5)
nRBC: 0 % (ref 0.0–0.2)

## 2024-03-25 LAB — GLUCOSE, CAPILLARY: Glucose-Capillary: 85 mg/dL (ref 70–99)

## 2024-03-25 MED ORDER — ONDANSETRON HCL 4 MG/2ML IJ SOLN: 4.0000 mg | Freq: Four times a day (QID) | INTRAMUSCULAR | Status: DC | PRN

## 2024-03-25 MED ORDER — LORAZEPAM 2 MG/ML PO CONC: 1.0000 mg | ORAL | Status: DC | PRN

## 2024-03-25 MED ORDER — ONDANSETRON 4 MG PO TBDP: 4.0000 mg | ORAL_TABLET | Freq: Four times a day (QID) | ORAL | Status: DC | PRN

## 2024-03-25 MED ORDER — HALOPERIDOL 1 MG PO TABS: 2.0000 mg | ORAL_TABLET | Freq: Four times a day (QID) | ORAL | Status: DC | PRN

## 2024-03-25 MED ORDER — LORAZEPAM 1 MG PO TABS
1.0000 mg | ORAL_TABLET | ORAL | Status: DC | PRN
  Administered 2024-03-26 – 2024-04-01 (×10): 1 mg via ORAL
  Filled 2024-03-25 (×10): qty 1

## 2024-03-25 MED ORDER — HYDROMORPHONE HCL 1 MG/ML IJ SOLN
0.5000 mg | INTRAMUSCULAR | Status: DC | PRN
  Administered 2024-03-26 – 2024-03-27 (×3): 1 mg via INTRAVENOUS
  Filled 2024-03-25 (×3): qty 1

## 2024-03-25 MED ORDER — OXYCODONE HCL 5 MG PO TABS
5.0000 mg | ORAL_TABLET | ORAL | Status: DC | PRN
  Administered 2024-03-27 – 2024-04-01 (×7): 5 mg via ORAL
  Filled 2024-03-25 (×7): qty 1

## 2024-03-25 MED ORDER — POLYVINYL ALCOHOL 1.4 % OP SOLN: 1.0000 [drp] | Freq: Four times a day (QID) | OPHTHALMIC | Status: DC | PRN

## 2024-03-25 MED ORDER — AMIODARONE HCL 200 MG PO TABS
200.0000 mg | ORAL_TABLET | Freq: Every day | ORAL | Status: DC
  Administered 2024-03-25 – 2024-04-01 (×7): 200 mg via ORAL
  Filled 2024-03-25 (×8): qty 1

## 2024-03-25 MED ORDER — POTASSIUM CHLORIDE CRYS ER 20 MEQ PO TBCR
40.0000 meq | EXTENDED_RELEASE_TABLET | Freq: Two times a day (BID) | ORAL | Status: DC
  Filled 2024-03-25: qty 2

## 2024-03-25 MED ORDER — DIPHENHYDRAMINE HCL 50 MG/ML IJ SOLN
25.0000 mg | INTRAMUSCULAR | Status: DC | PRN
  Filled 2024-03-25: qty 1

## 2024-03-25 MED ORDER — HALOPERIDOL LACTATE 5 MG/ML IJ SOLN
2.0000 mg | INTRAMUSCULAR | Status: DC | PRN
  Administered 2024-03-27: 2 mg via INTRAVENOUS
  Filled 2024-03-25: qty 1

## 2024-03-25 MED ORDER — LORAZEPAM 2 MG/ML IJ SOLN
1.0000 mg | INTRAMUSCULAR | Status: DC | PRN
  Administered 2024-03-26 – 2024-03-28 (×4): 1 mg via INTRAVENOUS
  Filled 2024-03-25 (×4): qty 1

## 2024-03-25 MED ORDER — GLYCOPYRROLATE 1 MG PO TABS: 1.0000 mg | ORAL_TABLET | ORAL | Status: DC | PRN

## 2024-03-25 MED ORDER — BIOTENE DRY MOUTH MT LIQD
15.0000 mL | Freq: Two times a day (BID) | OROMUCOSAL | Status: DC
  Administered 2024-03-26 – 2024-04-01 (×10): 15 mL via TOPICAL

## 2024-03-25 MED ORDER — HALOPERIDOL LACTATE 2 MG/ML PO CONC: 2.0000 mg | Freq: Four times a day (QID) | ORAL | Status: DC | PRN

## 2024-03-25 MED ORDER — PHENAZOPYRIDINE HCL 100 MG PO TABS
100.0000 mg | ORAL_TABLET | Freq: Three times a day (TID) | ORAL | Status: DC
  Administered 2024-03-26 – 2024-04-01 (×18): 100 mg via ORAL
  Filled 2024-03-25 (×19): qty 1

## 2024-03-25 MED ORDER — GLYCOPYRROLATE 0.2 MG/ML IJ SOLN: 0.2000 mg | INTRAMUSCULAR | Status: DC | PRN

## 2024-03-25 NOTE — TOC Progression Note (Addendum)
 Transition of Care [facility]) - Progression Note    Patient Details  Name: Guynell Kleiber MRN: [id number] Date of Birth: 1945-03-09  Transition [facility]) CM/SW Contact  [name], [place] Phone Number: 03/25/2024, 11:31 AM  Clinical Narrative:  Palliative following.  1:12 PM Per Palliative, brother would like patient to be comfort care at this time. Per Palliative, they will reassess in 1-2 days for inpatient hospice eligibility. If not, brother is interested in LTC with hospice. CSW followed up with [name] ([email or web address]) and will provided information for DSS/medicaid workers. CSW will follow up with facilities about LTC bed availability if that is what patient ends up needing.   1:59 PM CSW inquired with the following facilities about a LTC bed in the event patient needs it:  [facility] - they would like her to come in under short term rehab and then transition to long term care.  [facility] - has long term care beds [facility] - has long term care beds, would need proof of financials  [facility] - has long term care beds, the facility admin would run an assets check just like Medicaid would.   The following facilities have no available LTC bed: [facility], [facility], [place].   CSW will continue to follow.    Expected Discharge Plan: Skilled Nursing Facility Barriers to Discharge: Continued Medical Work up, SNF Pending bed offer               Expected Discharge Plan and Services In-house Referral: Clinical Social Work   Post Acute Care Choice: Skilled Nursing Facility Living arrangements for the past 2 months: Single Family Home                                       Social Drivers of Health (SDOH) Interventions SDOH Screenings   Food Insecurity: No Food Insecurity (03/24/2024)  Housing: Low Risk (03/24/2024)  Transportation Needs: Unmet Transportation Needs (03/24/2024)  Utilities: Not At Risk (03/24/2024)  Social  Connections: Patient Declined (03/24/2024)  Tobacco Use: Low Risk (03/14/2024)    Readmission Risk Interventions    03/11/2024   10:31 AM 10/26/2023    1:57 PM 04/06/2023    8:44 AM  Readmission Risk Prevention Plan  Post Dischage Appt  Complete   Medication Screening  Complete   Transportation Screening Complete Complete Complete  PCP or Specialist Appt within 3-5 Days   Complete  HRI or Home Care Consult   Complete  Social Work Consult for Recovery Care Planning/Counseling   Complete  Palliative Care Screening   Not Applicable  Medication Review Oceanographer) Complete  Complete  PCP or Specialist appointment within 3-5 days of discharge Complete    HRI or Home Care Consult Complete    SW Recovery Care/Counseling Consult Complete    Palliative Care Screening Complete    Skilled Nursing Facility Complete

## 2024-03-25 NOTE — Progress Notes (Signed)
 " PROGRESS NOTE    Monica Conway  FMW:[id number] DOB: 12-Aug-1945 DOA: 03/22/2024 PCP: [facility]  Frail debilitated 79/F with history of CVA, recurrent GI bleeds, paroxysmal A-fib not on anticoagulation, osteoarthritis, severe malnutrition, recent admission with A-fib RVR delirium, Citrobacter UTI, seen by palliative care, initially agreed to SNF and then patient insisted on discharge home with additional home care services. - Back in the ED 1/27 with leg swelling, in the ER tachycardic creatinine 1.26, pro proBNP 4790, UA with pyuria bacteriuria   Subjective: More alert and interactive this morning  Assessment and Plan:  Acute on chronic diastolic CHF Hypoalbuminemia, third spacing - Recent echo 1/9 noted EF 55-60%, normal RV - Volume up mildly this admission, compounded by third spacing - Cards following, wide fluctuation in blood pressure, significantly low overnight, now improving - On low-dose Lasix  with midodrine  - Palliative care meeting today, discussed concerns with brother yesterday  Paroxysmal A-fib with RVR - Briefly used Cardizem  gtt. yesterday, stopped for hypotension, then started on Amio gtt. - Appears to be in sinus rhythm this morning - Not on anticoagulation due to history of GI bleed and frequent falls - Limited options  Abnormal UA Staph hemolyticus UTI Recent Citrobacter UTI, delirium - Mild leukocytosis on admission, UA is abnormal, started on ceftriaxone , -Urine culture with staph hemolyticus, likely a contaminant, will complete 3-day course of ceftriaxone  - Very poor historian, patient unable to provide any meaningful history regarding urinary symptoms  Hyperkalemia -Resolved  Failure to thrive Severe malnutrition Cognitive decline - Do not anticipate significant improvement in her overall condition, palliative reconsulted  - Continue Seroquel  -Overall prognosis is poor, called and discussed this with her brother Monica, he was  agreeable to start palliative care discussions again including hospice etc. for family meeting today  DVT prophylaxis: lovenox  Code Status: DNR Family Communication: NO family at bedside, called and discussed with brother Monica Conway yesterday Disposition Plan: To be determined  Consultants:    Procedures:   Antimicrobials:    Objective: Vitals:   03/24/24 2302 03/25/24 0326 03/25/24 0437 03/25/24 0814  BP: (!) 102/55 102/72  (!) 93/54  Pulse: 67 71  96  Resp: 14 17  17   Temp: 98 F (36.7 C) 98.5 F (36.9 C)  (!) 97.4 F (36.3 C)  TempSrc: Oral Oral  Axillary  SpO2: 96% 100%  95%  Weight:   38 kg   Height:        Intake/Output Summary (Last 24 hours) at 03/25/2024 1146 Last data filed at 03/25/2024 9373 Gross per 24 hour  Intake 275.93 ml  Output 200 ml  Net 75.93 ml   Filed Weights   03/23/24 1136 03/24/24 0300 03/25/24 0437  Weight: 39.8 kg 38 kg 38 kg    Examination:  General exam: Frail chronically ill cachectic, more alert and interactive today Respiratory system: Few basilar rales Cardiovascular system: S1 & S2 heard, irregular Abd: nondistended, soft and nontender.Normal bowel sounds heard. Central nervous system: More awake and appropriate this morning, oriented to self and partly to place, moves all extremities no localizing signs Extremities: 1+ edema, chronic skin changes Skin: As above Psychiatry: Irritable, flat affect    Data Reviewed:   CBC: Recent Labs  Lab 03/22/24 1901 03/23/24 0525 03/24/24 1103 03/25/24 0247  WBC 10.7* 10.8* 8.0 8.3  NEUTROABS 8.0* 8.0*  --   --   HGB 13.3 12.4 11.9* 10.4*  HCT 41.2 38.8 36.8 31.8*  MCV 98.1 97.5 96.3 95.2  PLT 493* 446*  394 331   Basic Metabolic Panel: Recent Labs  Lab 03/22/24 1901 03/23/24 0525 03/24/24 1103 03/25/24 0247  NA 140 140 139 139  K 5.1 5.3* 4.0 3.4*  CL 105 106 104 104  CO2 23 22 24 25   GLUCOSE 86 78 96 79  BUN 34* 32* 35* 32*  CREATININE 1.26* 1.31* 1.61* 1.44*   CALCIUM  9.1 9.0 8.1* 7.5*  MG  --  1.8  --   --    GFR: Estimated Creatinine Clearance: 19.6 mL/min (A) (by C-G formula based on SCr of 1.44 mg/dL (H)). Liver Function Tests: Recent Labs  Lab 03/22/24 1901 03/23/24 0525  AST 28 25  ALT 19 16  ALKPHOS 108 106  BILITOT 0.4 0.3  PROT 7.5 6.9  ALBUMIN 3.3* 3.1*   No results for input(s): LIPASE, AMYLASE in the last 168 hours. No results for input(s): AMMONIA in the last 168 hours. Coagulation Profile: No results for input(s): INR, PROTIME in the last 168 hours. Cardiac Enzymes: No results for input(s): CKTOTAL, CKMB, CKMBINDEX, TROPONINI in the last 168 hours. BNP (last 3 results) Recent Labs    03/03/24 1817 03/22/24 1901 03/23/24 0525  PROBNP 3,427.0* 4,058.0* 4,790.0*   HbA1C: No results for input(s): HGBA1C in the last 72 hours. CBG: No results for input(s): GLUCAP in the last 168 hours. Lipid Profile: No results for input(s): CHOL, HDL, LDLCALC, TRIG, CHOLHDL, LDLDIRECT in the last 72 hours. Thyroid Function Tests: Recent Labs    03/22/24 1901 03/22/24 2316  TSH 5.240*  --   T3FREE  --  2.9   Anemia Panel: No results for input(s): VITAMINB12, FOLATE, FERRITIN, TIBC, IRON , RETICCTPCT in the last 72 hours. Urine analysis:    Component Value Date/Time   COLORURINE YELLOW 03/22/2024 2116   APPEARANCEUR CLEAR 03/22/2024 2116   LABSPEC 1.017 03/22/2024 2116   PHURINE 7.0 03/22/2024 2116   GLUCOSEU NEGATIVE 03/22/2024 2116   HGBUR NEGATIVE 03/22/2024 2116   BILIRUBINUR NEGATIVE 03/22/2024 2116   KETONESUR NEGATIVE 03/22/2024 2116   PROTEINUR 30 (A) 03/22/2024 2116   UROBILINOGEN 0.2 02/04/2014 2020   NITRITE POSITIVE (A) 03/22/2024 2116   LEUKOCYTESUR TRACE (A) 03/22/2024 2116   Sepsis Labs: @LABRCNTIP (procalcitonin:4,lacticidven:4)  ) Recent Results (from the past 240 hours)  Urine Culture     Status: Abnormal   Collection Time: 03/22/24  9:16 PM    Specimen: Urine, Random  Result Value Ref Range Status   Specimen Description URINE, RANDOM  Final   Special Requests   Final    NONE Reflexed from [phone] [id number] Performed at [facility], [address] [place], [place] [address]    Culture >=100,000 COLONIES/mL STAPHYLOCOCCUS HAEMOLYTICUS (A)  Final   Report Status 03/25/2024 FINAL  Final   Organism ID, Bacteria STAPHYLOCOCCUS HAEMOLYTICUS (A)  Final      Susceptibility   Staphylococcus haemolyticus - MIC*    CIPROFLOXACIN >=8 RESISTANT Resistant     GENTAMICIN >=16 RESISTANT Resistant     NITROFURANTOIN <=16 SENSITIVE Sensitive     OXACILLIN >=4 RESISTANT Resistant     TETRACYCLINE >=16 RESISTANT Resistant     VANCOMYCIN  1 SENSITIVE Sensitive     TRIMETH/SULFA 160 RESISTANT Resistant     RIFAMPIN >=32 RESISTANT Resistant     Inducible Clindamycin NEGATIVE Sensitive     * >=100,000 COLONIES/mL STAPHYLOCOCCUS HAEMOLYTICUS     Radiology Studies: ECHOCARDIOGRAM LIMITED Result Date: 03/24/2024    ECHOCARDIOGRAM LIMITED REPORT   Patient Name:   RONAL ALFREDIA SHARPS Date of Exam:  03/24/2024 Medical Rec #:  [id number]         Height:       62.0 in Accession #:    [id number]        Weight:       83.8 lb Date of Birth:  08-22-1945         BSA:          1.321 m Patient Age:    78 years          BP:           102/61 mmHg Patient Gender: F                 HR:           72 bpm. Exam Location:  Inpatient Procedure: Limited Echo, Cardiac Doppler and Color Doppler (Both Spectral and            Color Flow Doppler were utilized during procedure). Indications:    Acute on chronic heart failure  History:        Patient has prior history of Echocardiogram examinations, most                 recent 03/04/2024. CHF, Arrythmias:Atrial Fibrillation,                 Signs/Symptoms:Hypotension; Risk Factors:Hypertension.  Sonographer:    Juliene Rucks Referring Phys: [id number] MARSHA ADA  Sonographer Comments: Technically difficult study due to poor echo windows. Image  acquisition challenging due to uncooperative patient. IMPRESSIONS  1. Left ventricular ejection fraction, by estimation, is 60 to 65%. The left ventricle has normal function. The left ventricle has no regional wall motion abnormalities. There is mild left ventricular hypertrophy of the basal-septal segment. Left ventricular diastolic parameters are consistent with Grade II diastolic dysfunction (pseudonormalization). Elevated left atrial pressure.  2. Right ventricular systolic function was not well visualized.  3. The aortic valve was not well visualized. There is moderate calcification of the aortic valve. There is moderate thickening of the aortic valve. Doppler of the AV was not performed. FINDINGS  Left Ventricle: Left ventricular ejection fraction, by estimation, is 60 to 65%. The left ventricle has normal function. The left ventricle has no regional wall motion abnormalities. There is mild left ventricular hypertrophy of the basal-septal segment. Left ventricular diastolic parameters are consistent with Grade II diastolic dysfunction (pseudonormalization). Elevated left atrial pressure. Right Ventricle: Right ventricular systolic function was not well visualized. Aortic Valve: The aortic valve was not well visualized. There is moderate calcification of the aortic valve. There is moderate thickening of the aortic valve. LEFT VENTRICLE PLAX 2D LVIDd:         4.10 cm   Diastology LVIDs:         3.00 cm   LV e' medial:    4.33 cm/s LV PW:         0.90 cm   LV E/e' medial:  18.3 LV IVS:        1.20 cm   LV e' lateral:   6.36 cm/s LVOT diam:     1.90 cm   LV E/e' lateral: 12.4 LVOT Area:     2.84 cm  LEFT ATRIUM             Index LA diam:        3.10 cm 2.35 cm/m LA Vol (A2C):   17.9 ml 13.55 ml/m LA Vol (A4C):   29.1 ml 22.03 ml/m LA Biplane Vol: 22.4 ml 16.96  ml/m   AORTA Ao Root diam: 2.90 cm MITRAL VALVE MV Area (PHT): 4.31 cm    SHUNTS MV Decel Time: 176 msec    Systemic Diam: 1.90 cm MV E velocity:  79.10 cm/s MV A velocity: 83.40 cm/s MV E/A ratio:  0.95 Wilbert Bihari MD Electronically signed by Wilbert Bihari MD Signature Date/Time: 03/24/2024/7:32:46 PM    Final      Scheduled Meds:  antiseptic oral rinse  15 mL Topical BID   furosemide   20 mg Intravenous BID   pantoprazole   40 mg Oral BID   polyethylene glycol  17 g Oral Daily   QUEtiapine   25 mg Oral QHS   senna-docusate  1 tablet Oral QHS   Continuous Infusions:     LOS: 2 days    Time spent:    Sigurd Pac, MD [facility]   03/25/2024, 11:46 AM    "

## 2024-03-25 NOTE — Progress Notes (Signed)
 "  Progress Note  Patient Name: Monica Conway Date of Encounter: 03/25/2024 [facility] Cardiologist: Shelda Bruckner, MD   Interval Summary   Patient only allows limited interview/exam before requesting to be left alone. States that she is still having considerable pain in her knees. Feels a bit better regarding leg swelling but still tender. Denies any chest pain, dyspnea at this time  Vital Signs Vitals:   03/24/24 2302 03/25/24 0326 03/25/24 0437 03/25/24 0814  BP: (!) 102/55 102/72  (!) 93/54  Pulse: 67 71  96  Resp: 14 17  17   Temp: 98 F (36.7 C) 98.5 F (36.9 C)  (!) 97.4 F (36.3 C)  TempSrc: Oral Oral  Axillary  SpO2: 96% 100%  95%  Weight:   38 kg   Height:        Intake/Output Summary (Last 24 hours) at 03/25/2024 9082 Last data filed at 03/25/2024 9373 Gross per 24 hour  Intake 515.93 ml  Output 200 ml  Net 315.93 ml      03/25/2024    4:37 AM 03/24/2024    3:00 AM 03/23/2024   11:36 AM  Last 3 Weights  Weight (lbs) 83 lb 12.4 oz 83 lb 12.4 oz 87 lb 11.9 oz  Weight (kg) 38 kg 38 kg 39.8 kg      Telemetry/ECG  Sinus rhythm, rates in the 70s - Personally Reviewed  Physical Exam  GEN: Frail chronically ill-appearing elderly female, resting in bed, in no acute distress Neck: Difficult to fully assess due to patient positioning limited by pain Cardiac: RRR, 3/6 systolic murmur Respiratory: Patient only allowed brief exam, CTA bilaterally at apices GI: Soft, nontender MS: Patient declined exam due to tenderness, however lower extremities still erythematous and edematous bilaterally by visual inspection, appears mildly improved compared to yesterday's exam  Assessment & Plan  Acute on chronic HFpEF Presented with acute onset bilateral leg swelling and pain proBNP 4,058 No pleural effusion on CXR S/p IV Lasix  20 mg x2, no I/O documented in chart Echo this admission showed preserved EF, mild LVH, G2DD, elevated LA pressure, unable to assess  RV or AV Assessment of volume status limited due to pt in fixed position and pain with repositioning. However, by visual inspection bilateral lower extremities still erythematous and edematous albeit improved slightly. Diuresis limited by hypotension and steadily rising creatinine. Likely third spacing as well with albumin 2.4 prior to diuresis >> 3.1 after. On midodrine  for BP support and started back on IV Lasix  20 mg by primary team, however if renal function continues to decline, will need to hold diuretic.    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 Unable to anticoagulate due to history of GI bleed and frequent falls Home meds: Diltiazem  and metoprolol  succinate, although patient is unable to confirm at what dose and frequency she has been taking these medications ED EKG showed ?atrial flutter vs atrial tachycardia with significant artifact, rate 105 bpm TSH 5.24, free T3 normal Normal K, mag on admission Converted to sinus, will continue amiodarone  for now and may consider longterm amiodarone  although this is not ideal for her. Will await palliative care discussion to gain more insight on goals of care  Hypokalemia Will replace with KCl 40 mEq BID  Failure to thrive Goals of care Patient is chronically ill with multiple comorbidities, multiple recent admissions, and high risk of decompensation. Options for management are limited regarding her Afib and heart failure  due to hypotension and inability to tolerate anticoagulation in the past. CHA2DS2-VASc score of 7, however cautious of risks with anticoagulation due to history of GI bleed and frequent falls. May consider heparin  trial and monitor for bleeding, if able to tolerate can start DOAC at that point. Appreciate consult to palliative who has seen patient during prior admissions, will adjust approach to medical therapy based on their discussion with patient on goals of  care   Per primary Abnormal UA/presumed acute cystitis Bilateral knee osteoarthritis Osteoporosis  For questions or updates, please contact [facility] Please consult [email or web address] for contact info under         Signed, Owen MARLA Daniels, PA-C   "

## 2024-03-25 NOTE — Consult Note (Cosign Needed Addendum)
 "                                                  Palliative Care Consult Note                                  Date: 03/25/2024   Patient Name: Monica Conway  DOB: 01/15/1946  MRN: [id number]  Age / Sex: 79 y.o., female  PCP: [facility] Referring Physician: Fairy Frames, MD  Reason for Consultation: Establishing goals of care  HPI/Patient Profile: 79 y.o. female  with past medical history of cognitive impairment, CVA, recurrent GI bleeds, paroxysmal A-fib not on anticoagulation, osteoarthritis, severe malnutrition (BMI < 16), and failure to thrive admitted on 03/22/2024 with leg swelling. Patient had recent admission with A-fib with RVR, delirium, and Citrobacter UTI.  In the ER, patient was tachycardic with creatinine 1.26, pro proBNP 4790, and UA with pyuria bacteriuria.   Patient admitted with acute on chronic diastolic CHF, paroxysmal a-fib with RVR and abnormal UA.  Past Medical History:  Diagnosis Date   Acute on chronic congestive heart failure (HCC) 03/04/2024   Arthritis    Asthma    Atrial fibrillation with RVR (HCC) 12/29/2023   Blood transfusion without reported diagnosis    Cerebrovascular accident (CVA) (HCC) 01/31/2023   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 (HCC) 01/26/2023   Closed left hip fracture (HCC) 08/03/2015   Hypertension    Hypokalemia 04/08/2023   Hypomagnesemia 12/29/2023   Metabolic acidosis 01/26/2023   Osteoporosis    Sepsis secondary to UTI (HCC) 12/18/2023    Subjective:   I have reviewed medical records including EPIC notes, labs and imaging, received updates from nursing and attending provider, assessed the patient and then spoke with her brother Monica Conway to discuss diagnosis prognosis, GOC, EOL wishes, disposition and options.  I introduced Palliative Medicine as specialized medical care for people living with serious illness. It focuses on providing relief from symptoms and stress of a serious illness.  The goal is to improve quality of life for both the patient and the family.  Today's Discussion: This morning the patient was sitting up in bed with her eyes closed.  She appeared comfortable and in no distress.  When I called out her name she opened her eyes but quickly closes them again.  She was very sleepy and could not carry on a conversation.  She had eaten most of her breakfast.  No family at bedside.  Spoke with patient's brother Monica.  He is her next of kin and proxy decision-maker.  He has a good understanding of her chronic diseases.  We discussed her chronic diseases, failure to thrive, recent hospitalization, and current hospitalization.  We discussed her poor overall prognosis and limited options for managing her heart failure and atrial fibrillation.  Patient's brother Monica shared that the patient had cognitive impairment at baseline when her health was good but this has worsened as her health worsened.  We discussed that the patient does not have capacity to make healthcare decisions and as her next of kin Monica is her decision-maker.  After the last hospitalization the patient discharged home with hired caregivers but this was not sufficient help. Monica understands patient will likely need 24/7 care at discharge.  Patient does not have advanced directives.  Brother confirms DNR/DNI status.  We discussed the difference between an aggressive medical intervention path and a comfort focussed  path for this patient.  Patient's brother would like to transition patient to comfort focused care.  We discussed that the patient would no longer receive aggressive medical interventions such as continuous vital signs, lab work, radiology testing, or medications not focused on comfort. All care would focus on how the patient is looking and feeling. This would include management of any symptoms that may cause discomfort, pain, shortness of breath, cough, nausea, agitation, anxiety, and/or secretions  etc. Symptoms would be managed with medications and other non-pharmacological interventions.  We discussed hospice philosophy and potential locations for end-of-life care.    Monica is hopeful the patient will be eligible for inpatient hospice in the next couple days.  We discussed SNF with hospice if she is ineligible.  We will transition to comfort measures today.  Emotional support and therapeutic listening provided.  Discussed the importance of continued conversation with family and the medical providers regarding overall plan of care and treatment options, ensuring decisions are within the context of the patient's values and GOCs.  Questions and concerns were addressed. Hard Choices booklet left for review. The family was encouraged to call with questions or concerns. PMT will continue to support holistically.  1600 Received a message from nursing that patient had questions about the plan. Patient was alert and oriented sitting up in bed. She said,  I do not want to die. She went on to say she was scared, her nerves were bad, and that she was not in the right mindset to make decisions. She was under the impression we were giving her medications to cause her death. I reassured her that we were not giving her any medications to cause her death. I shared that we were continuing her lasix  to help with her swelling and provide comfort.  I attempted to reassure her that her brother was trying to make decisions that he knew aligned with her wishes. Requested prn medication for anxiety. Patient shared she has urinary symptoms including burning. Will add pyridium  for UTI symptoms.  1700 Attempted to call patient's brother to update him. Left voicemail with callback information.  Review of Systems  Unable to perform ROS   Objective:   Primary Diagnoses: Present on Admission:  Acute on chronic diastolic heart failure (HCC)  Paroxysmal atrial fibrillation with RVR (HCC)   Physical Exam Vitals  reviewed.  Constitutional:      General: She is sleeping. She is not in acute distress.    Appearance: She is ill-appearing.  HENT:     Head: Normocephalic and atraumatic.  Cardiovascular:     Rate and Rhythm: Normal rate.  Pulmonary:     Effort: Pulmonary effort is normal.  Skin:    General: Skin is dry.     Vital Signs:  BP (!) 93/54 (BP Location: Right Leg)   Pulse 96   Temp (!) 97.4 F (36.3 C) (Axillary)   Resp 17   Ht 5' 2 (1.575 m)   Wt 38 kg   SpO2 95%   BMI 15.32 kg/m    Advanced Care Planning:   Existing Vynca/ACP Documentation: Goals of care 03/08/24  Primary Decision Maker: NEXT OF KIN  Code Status/Advance Care Planning: DNR   Assessment & Plan:   SUMMARY OF RECOMMENDATIONS   Comfort focused care Family hopeful for inpatient hospice eligibility in next couple days- if  not then SNF with hospice- TOC notified Continued PMT support  Symptom Management:  Dilaudid  PRN for pain/air hunger/comfort Robinul  PRN for excessive secretions Ativan  PRN for agitation/anxiety Zofran  PRN for nausea Liquifilm tears PRN for dry eyes Haldol  PRN for agitation/anxiety May have comfort feeding Comfort cart for family Unrestricted visitations in the setting of EOL (per policy) Oxygen PRN 2L or less for comfort. No escalation.     Discussed with: bedside RN, TOC, Dr. Fairy, and Erich Daniels PA Cardiology  Time Total: 105 minutes    Thank you for allowing us  to participate in the care of Ronal Alfredia Sharps PMT will continue to support holistically.     Signed by: Stephane Palin, NP Palliative Medicine Team  Team Phone # [phone] (Nights/Weekends)  03/25/2024, 12:55 PM   "

## 2024-03-26 DIAGNOSIS — I5033 Acute on chronic diastolic (congestive) heart failure: Secondary | ICD-10-CM | POA: Diagnosis not present

## 2024-03-26 DIAGNOSIS — I480 Paroxysmal atrial fibrillation: Secondary | ICD-10-CM | POA: Diagnosis not present

## 2024-03-26 NOTE — Plan of Care (Signed)

## 2024-03-27 DIAGNOSIS — I5033 Acute on chronic diastolic (congestive) heart failure: Secondary | ICD-10-CM | POA: Diagnosis not present

## 2024-03-27 NOTE — Progress Notes (Deleted)
 " PROGRESS NOTE    Monica Conway  FMW:[id number] DOB: 1945-10-24 DOA: 03/22/2024 PCP: [facility]  Frail debilitated [name] with history of CVA, recurrent GI bleeds, paroxysmal A-fib not on anticoagulation, osteoarthritis, severe malnutrition, recent admission with A-fib RVR delirium, Citrobacter UTI, seen by palliative care, initially agreed to SNF and then patient insisted on discharge home with additional home care services. - Back in the ED 1/27 with leg swelling, in the ER tachycardic creatinine 1.26, pro proBNP 4790, UA with pyuria bacteriuria -Admitted, started on antibiotics and diuretics - 1/30: Palliative care meeting, switched to comfort care   Subjective: -Sleeping when I walked in, then wanted to get up and get ready to go to the doctor's office today, required much convincing that she was in the hospital  Assessment and Plan:  Acute on chronic diastolic CHF Hypoalbuminemia, third spacing - Recent echo 1/9 noted EF 55-60%, normal RV - Volume up mildly this admission, compounded by third spacing - Cards following, GDMT limited by hypotension - Now comfort care  Paroxysmal A-fib with RVR - Briefly used Cardizem  gtt. stopped for hypotension, then started on Amio gtt. - Not on anticoagulation due to history of GI bleed and frequent falls - Limited options - Now comfort care  Abnormal UA Staph hemolyticus UTI Recent Citrobacter UTI, delirium - Mild leukocytosis on admission, UA is abnormal, started on ceftriaxone , -Urine culture with staph hemolyticus, likely a contaminant, completed 3 days of ceftriaxone  -Very poor historian, patient unable to provide any meaningful history regarding urinary symptoms  Hyperkalemia -Resolved  Failure to thrive Severe malnutrition Cognitive decline - Do not anticipate significant improvement in her overall condition, palliative reconsulted  - Continue Seroquel  -Overall prognosis is poor, called and discussed this with her  brother [name], palliative meeting completed , now comfort care, anticipate need for SNF with hospice  DVT prophylaxis: lovenox  Code Status: DNR Family Communication: NO family at bedside, called and discussed with brother [name] yesterday Disposition Plan: To be determined  Consultants:    Procedures:   Antimicrobials:    Objective: Vitals:   03/26/24 0440 03/26/24 0722 03/26/24 1712 03/27/24 0831  BP: 108/67 91/61 (!) 154/98 115/60  Pulse: 72 69 85 81  Resp: 18 18 18 18   Temp: 98 F (36.7 C) 98.7 F (37.1 C) 97.6 F (36.4 C) 97.8 F (36.6 C)  TempSrc: Oral Oral  Oral  SpO2: 96% 95% 99% 98%  Weight:      Height:        Intake/Output Summary (Last 24 hours) at 03/27/2024 1137 Last data filed at 03/27/2024 0900 Gross per 24 hour  Intake 240 ml  Output --  Net 240 ml   Filed Weights   03/23/24 1136 03/24/24 0300 03/25/24 0437  Weight: 39.8 kg 38 kg 38 kg    Examination:  General exam: Frail chronically ill, laying in bed, somewhat confused Respiratory system: Clear anteriorly Cardiovascular system: S1 & S2 heard, irregular Abd: nondistended, soft and nontender.Normal bowel sounds heard. Central nervous system: Awake and alert, confused moves all extremities no localizing signs Extremities: No edema, chronic skin changes Skin: As above Psychiatry: Irritable, flat affect    Data Reviewed:   CBC: Recent Labs  Lab 03/22/24 1901 03/23/24 0525 03/24/24 1103 03/25/24 0247  WBC 10.7* 10.8* 8.0 8.3  NEUTROABS 8.0* 8.0*  --   --   HGB 13.3 12.4 11.9* 10.4*  HCT 41.2 38.8 36.8 31.8*  MCV 98.1 97.5 96.3 95.2  PLT 493* 446* 394 331  Basic Metabolic Panel: Recent Labs  Lab 03/22/24 1901 03/23/24 0525 03/24/24 1103 03/25/24 0247  NA 140 140 139 139  K 5.1 5.3* 4.0 3.4*  CL 105 106 104 104  CO2 23 22 24 25   GLUCOSE 86 78 96 79  BUN 34* 32* 35* 32*  CREATININE 1.26* 1.31* 1.61* 1.44*  CALCIUM  9.1 9.0 8.1* 7.5*  MG  --  1.8  --   --     GFR: Estimated Creatinine Clearance: 19.6 mL/min (A) (by C-G formula based on SCr of 1.44 mg/dL (H)). Liver Function Tests: Recent Labs  Lab 03/22/24 1901 03/23/24 0525  AST 28 25  ALT 19 16  ALKPHOS 108 106  BILITOT 0.4 0.3  PROT 7.5 6.9  ALBUMIN 3.3* 3.1*   No results for input(s): LIPASE, AMYLASE in the last 168 hours. No results for input(s): AMMONIA in the last 168 hours. Coagulation Profile: No results for input(s): INR, PROTIME in the last 168 hours. Cardiac Enzymes: No results for input(s): CKTOTAL, CKMB, CKMBINDEX, TROPONINI in the last 168 hours. BNP (last 3 results) Recent Labs    03/03/24 1817 03/22/24 1901 03/23/24 0525  PROBNP 3,427.0* 4,058.0* 4,790.0*   HbA1C: No results for input(s): HGBA1C in the last 72 hours. CBG: Recent Labs  Lab 03/25/24 1607  GLUCAP 85   Lipid Profile: No results for input(s): CHOL, HDL, LDLCALC, TRIG, CHOLHDL, LDLDIRECT in the last 72 hours. Thyroid Function Tests: No results for input(s): TSH, T4TOTAL, FREET4, T3FREE, THYROIDAB in the last 72 hours.  Anemia Panel: No results for input(s): VITAMINB12, FOLATE, FERRITIN, TIBC, IRON , RETICCTPCT in the last 72 hours. Urine analysis:    Component Value Date/Time   COLORURINE YELLOW 03/22/2024 2116   APPEARANCEUR CLEAR 03/22/2024 2116   LABSPEC 1.017 03/22/2024 2116   PHURINE 7.0 03/22/2024 2116   GLUCOSEU NEGATIVE 03/22/2024 2116   HGBUR NEGATIVE 03/22/2024 2116   BILIRUBINUR NEGATIVE 03/22/2024 2116   KETONESUR NEGATIVE 03/22/2024 2116   PROTEINUR 30 (A) 03/22/2024 2116   UROBILINOGEN 0.2 02/04/2014 2020   NITRITE POSITIVE (A) 03/22/2024 2116   LEUKOCYTESUR TRACE (A) 03/22/2024 2116   Sepsis Labs: @LABRCNTIP (procalcitonin:4,lacticidven:4)  ) Recent Results (from the past 240 hours)  Urine Culture     Status: Abnormal   Collection Time: 03/22/24  9:16 PM   Specimen: Urine, Random  Result Value Ref Range  Status   Specimen Description URINE, RANDOM  Final   Special Requests   Final    NONE Reflexed from [id number] Performed at [facility], [address] [place], [place] [address]    Culture >=100,000 COLONIES/mL STAPHYLOCOCCUS HAEMOLYTICUS (A)  Final   Report Status 03/25/2024 FINAL  Final   Organism ID, Bacteria STAPHYLOCOCCUS HAEMOLYTICUS (A)  Final      Susceptibility   Staphylococcus haemolyticus - MIC*    CIPROFLOXACIN >=8 RESISTANT Resistant     GENTAMICIN >=16 RESISTANT Resistant     NITROFURANTOIN <=16 SENSITIVE Sensitive     OXACILLIN >=4 RESISTANT Resistant     TETRACYCLINE >=16 RESISTANT Resistant     VANCOMYCIN  1 SENSITIVE Sensitive     TRIMETH/SULFA 160 RESISTANT Resistant     RIFAMPIN >=32 RESISTANT Resistant     Inducible Clindamycin NEGATIVE Sensitive     * >=100,000 COLONIES/mL STAPHYLOCOCCUS HAEMOLYTICUS     Radiology Studies: No results found.    Scheduled Meds:  amiodarone   200 mg Oral Daily   antiseptic oral rinse  15 mL Topical BID   pantoprazole   40 mg Oral BID  phenazopyridine   100 mg Oral TID WC   polyethylene glycol  17 g Oral Daily   QUEtiapine   25 mg Oral QHS   senna-docusate  1 tablet Oral QHS   Continuous Infusions:   LOS: 4 days    Time spent:    Sigurd Pac, MD [facility]   03/27/2024, 11:37 AM    "

## 2024-03-27 NOTE — Progress Notes (Signed)
 " PROGRESS NOTE    Monica Conway  FMW:[id number] DOB: May 18, 1945 DOA: 03/22/2024 PCP: [facility]  Frail debilitated [name] with history of CVA, recurrent GI bleeds, paroxysmal A-fib not on anticoagulation, osteoarthritis, severe malnutrition, recent admission with A-fib RVR delirium, Citrobacter UTI, seen by palliative care, initially agreed to SNF and then patient insisted on discharge home with additional home care services. - Back in the ED 1/27 with leg swelling, in the ER tachycardic creatinine 1.26, pro proBNP 4790, UA with pyuria bacteriuria -Admitted, started on antibiotics and diuretics - 1/30: Palliative care meeting, switched to comfort care   Subjective: -Sleeping when I walked in, then wanted to get up and get ready to go to the doctor's office today, required much convincing that she was in the hospital  Assessment and Plan:  Acute on chronic diastolic CHF Hypoalbuminemia, third spacing - Recent echo 1/9 noted EF 55-60%, normal RV - Volume up mildly this admission, compounded by third spacing - Cards following, GDMT limited by hypotension - Now comfort care  Paroxysmal A-fib with RVR - Briefly used Cardizem  gtt. stopped for hypotension, then started on Amio gtt. - Not on anticoagulation due to history of GI bleed and frequent falls - Limited options - Now comfort care  Abnormal UA Staph hemolyticus UTI Recent Citrobacter UTI, delirium - Mild leukocytosis on admission, UA is abnormal, started on ceftriaxone , -Urine culture with staph hemolyticus, likely a contaminant, completed 3 days of ceftriaxone  -Very poor historian, patient unable to provide any meaningful history regarding urinary symptoms  Hyperkalemia -Resolved  Failure to thrive Severe malnutrition Cognitive decline - Do not anticipate significant improvement in her overall condition, palliative reconsulted  - Continue Seroquel  -Overall prognosis is poor, called and discussed this with her  brother [name], palliative meeting completed , now comfort care, anticipate need for SNF with hospice  DVT prophylaxis: lovenox  Code Status: DNR Family Communication: NO family at bedside, called and discussed with brother [name] yesterday Disposition Plan: To be determined  Consultants:    Procedures:   Antimicrobials:    Objective: Vitals:   03/26/24 0440 03/26/24 0722 03/26/24 1712 03/27/24 0831  BP: 108/67 91/61 (!) 154/98 115/60  Pulse: 72 69 85 81  Resp: 18 18 18 18   Temp: 98 F (36.7 C) 98.7 F (37.1 C) 97.6 F (36.4 C) 97.8 F (36.6 C)  TempSrc: Oral Oral  Oral  SpO2: 96% 95% 99% 98%  Weight:      Height:        Intake/Output Summary (Last 24 hours) at 03/27/2024 1135 Last data filed at 03/27/2024 0900 Gross per 24 hour  Intake 240 ml  Output --  Net 240 ml   Filed Weights   03/23/24 1136 03/24/24 0300 03/25/24 0437  Weight: 39.8 kg 38 kg 38 kg    Examination:  General exam: Frail chronically ill, laying in bed, somewhat confused Respiratory system: Clear anteriorly Cardiovascular system: S1 & S2 heard, irregular Abd: nondistended, soft and nontender.Normal bowel sounds heard. Central nervous system: Awake and alert, confused moves all extremities no localizing signs Extremities: No edema, chronic skin changes Skin: As above Psychiatry: Irritable, flat affect    Data Reviewed:   CBC: Recent Labs  Lab 03/22/24 1901 03/23/24 0525 03/24/24 1103 03/25/24 0247  WBC 10.7* 10.8* 8.0 8.3  NEUTROABS 8.0* 8.0*  --   --   HGB 13.3 12.4 11.9* 10.4*  HCT 41.2 38.8 36.8 31.8*  MCV 98.1 97.5 96.3 95.2  PLT 493* 446* 394 331  Basic Metabolic Panel: Recent Labs  Lab 03/22/24 1901 03/23/24 0525 03/24/24 1103 03/25/24 0247  NA 140 140 139 139  K 5.1 5.3* 4.0 3.4*  CL 105 106 104 104  CO2 23 22 24 25   GLUCOSE 86 78 96 79  BUN 34* 32* 35* 32*  CREATININE 1.26* 1.31* 1.61* 1.44*  CALCIUM  9.1 9.0 8.1* 7.5*  MG  --  1.8  --   --     GFR: Estimated Creatinine Clearance: 19.6 mL/min (A) (by C-G formula based on SCr of 1.44 mg/dL (H)). Liver Function Tests: Recent Labs  Lab 03/22/24 1901 03/23/24 0525  AST 28 25  ALT 19 16  ALKPHOS 108 106  BILITOT 0.4 0.3  PROT 7.5 6.9  ALBUMIN 3.3* 3.1*   No results for input(s): LIPASE, AMYLASE in the last 168 hours. No results for input(s): AMMONIA in the last 168 hours. Coagulation Profile: No results for input(s): INR, PROTIME in the last 168 hours. Cardiac Enzymes: No results for input(s): CKTOTAL, CKMB, CKMBINDEX, TROPONINI in the last 168 hours. BNP (last 3 results) Recent Labs    03/03/24 1817 03/22/24 1901 03/23/24 0525  PROBNP 3,427.0* 4,058.0* 4,790.0*   HbA1C: No results for input(s): HGBA1C in the last 72 hours. CBG: Recent Labs  Lab 03/25/24 1607  GLUCAP 85   Lipid Profile: No results for input(s): CHOL, HDL, LDLCALC, TRIG, CHOLHDL, LDLDIRECT in the last 72 hours. Thyroid Function Tests: No results for input(s): TSH, T4TOTAL, FREET4, T3FREE, THYROIDAB in the last 72 hours.  Anemia Panel: No results for input(s): VITAMINB12, FOLATE, FERRITIN, TIBC, IRON , RETICCTPCT in the last 72 hours. Urine analysis:    Component Value Date/Time   COLORURINE YELLOW 03/22/2024 2116   APPEARANCEUR CLEAR 03/22/2024 2116   LABSPEC 1.017 03/22/2024 2116   PHURINE 7.0 03/22/2024 2116   GLUCOSEU NEGATIVE 03/22/2024 2116   HGBUR NEGATIVE 03/22/2024 2116   BILIRUBINUR NEGATIVE 03/22/2024 2116   KETONESUR NEGATIVE 03/22/2024 2116   PROTEINUR 30 (A) 03/22/2024 2116   UROBILINOGEN 0.2 02/04/2014 2020   NITRITE POSITIVE (A) 03/22/2024 2116   LEUKOCYTESUR TRACE (A) 03/22/2024 2116   Sepsis Labs: @LABRCNTIP (procalcitonin:4,lacticidven:4)  ) Recent Results (from the past 240 hours)  Urine Culture     Status: Abnormal   Collection Time: 03/22/24  9:16 PM   Specimen: Urine, Random  Result Value Ref Range  Status   Specimen Description URINE, RANDOM  Final   Special Requests   Final    NONE Reflexed from [id number] Performed at [facility], [address] [place], [place] [address]    Culture >=100,000 COLONIES/mL STAPHYLOCOCCUS HAEMOLYTICUS (A)  Final   Report Status 03/25/2024 FINAL  Final   Organism ID, Bacteria STAPHYLOCOCCUS HAEMOLYTICUS (A)  Final      Susceptibility   Staphylococcus haemolyticus - MIC*    CIPROFLOXACIN >=8 RESISTANT Resistant     GENTAMICIN >=16 RESISTANT Resistant     NITROFURANTOIN <=16 SENSITIVE Sensitive     OXACILLIN >=4 RESISTANT Resistant     TETRACYCLINE >=16 RESISTANT Resistant     VANCOMYCIN  1 SENSITIVE Sensitive     TRIMETH/SULFA 160 RESISTANT Resistant     RIFAMPIN >=32 RESISTANT Resistant     Inducible Clindamycin NEGATIVE Sensitive     * >=100,000 COLONIES/mL STAPHYLOCOCCUS HAEMOLYTICUS     Radiology Studies: No results found.    Scheduled Meds:  amiodarone   200 mg Oral Daily   antiseptic oral rinse  15 mL Topical BID   pantoprazole   40 mg Oral BID  phenazopyridine   100 mg Oral TID WC   polyethylene glycol  17 g Oral Daily   QUEtiapine   25 mg Oral QHS   senna-docusate  1 tablet Oral QHS   Continuous Infusions:     LOS: 4 days    Time spent:    Sigurd Pac, MD [facility]   03/27/2024, 11:35 AM    "

## 2024-03-28 DIAGNOSIS — I5033 Acute on chronic diastolic (congestive) heart failure: Secondary | ICD-10-CM | POA: Diagnosis not present

## 2024-03-28 MED ORDER — CALCIUM CARBONATE ANTACID 500 MG PO CHEW: 1.0000 | CHEWABLE_TABLET | ORAL | Status: DC | PRN

## 2024-03-28 MED ORDER — GERHARDT'S BUTT CREAM
1.0000 | TOPICAL_CREAM | Freq: Four times a day (QID) | CUTANEOUS | Status: DC
  Administered 2024-03-29 – 2024-04-01 (×10): 1 via TOPICAL
  Filled 2024-03-28 (×2): qty 60

## 2024-03-28 MED ORDER — CARMEX CLASSIC LIP BALM EX OINT
1.0000 | TOPICAL_OINTMENT | CUTANEOUS | Status: DC | PRN
  Filled 2024-03-28: qty 10

## 2024-03-28 NOTE — Plan of Care (Signed)

## 2024-03-28 NOTE — Progress Notes (Signed)
 "                                                                                                                                                                                                          Daily Progress Note   Patient Name: Monica Conway       Date: 03/28/2024 DOB: 1946/02/14  Age: 79 y.o. MRN#: [id number] Attending Physician: Fairy Frames, MD Primary Care Physician: [facility]mit Date: 03/22/2024  Reason for Consultation/Follow-up: Establishing goals of care   Length of Stay: 5  Current Medications: Scheduled Meds:   amiodarone   200 mg Oral Daily   antiseptic oral rinse  15 mL Topical BID   pantoprazole   40 mg Oral BID   phenazopyridine   100 mg Oral TID WC   polyethylene glycol  17 g Oral Daily   QUEtiapine   25 mg Oral QHS   senna-docusate  1 tablet Oral QHS    Continuous Infusions:   PRN Meds: acetaminophen  **OR** acetaminophen , artificial tears, calcium  carbonate, diphenhydrAMINE , glycopyrrolate  **OR** glycopyrrolate  **OR** glycopyrrolate , haloperidol  **OR** haloperidol  **OR** haloperidol  lactate, HYDROmorphone  (DILAUDID ) injection, LORazepam  **OR** LORazepam  **OR** LORazepam , melatonin, ondansetron  **OR** ondansetron  (ZOFRAN ) IV, oxyCODONE   Physical Exam Vitals reviewed.  Constitutional:      General: She is not in acute distress.    Appearance: She is ill-appearing.  HENT:     Head: Normocephalic and atraumatic.  Cardiovascular:     Rate and Rhythm: Normal rate.  Pulmonary:     Effort: Pulmonary effort is normal.  Skin:    General: Skin is warm and dry.  Neurological:     Mental Status: She is alert. She is disoriented.  Psychiatric:        Mood and Affect: Mood is anxious.             Vital Signs: BP 120/64 (BP Location: Left Arm)   Pulse 76   Temp 97.6 F (36.4 C) (Oral)   Resp 18   Ht 5' 2 (1.575 m)   Wt 38 kg   SpO2 98%   BMI 15.32 kg/m  SpO2: SpO2: 98 % O2 Device: O2 Device: Room Air O2 Flow Rate:             Patient Active Problem List   Diagnosis Date Noted   Acute on chronic diastolic heart failure (HCC) 03/23/2024   Hypotension 03/23/2024   Goals of care, counseling/discussion 03/23/2024   Protein-calorie malnutrition, severe 03/10/2024   Acute on chronic congestive heart failure (HCC) 03/04/2024   Failure to thrive in adult 03/03/2024  Cognitive impairment 12/29/2023   Lumbar compression fracture (HCC) 10/25/2023   History of stroke - 01-2023. felt to be due to afib. 04/08/2023   Gastric ulcer 04/08/2023   Proctocolitis 04/08/2023   AKI (acute kidney injury) 04/08/2023   Delirium due to multiple etiologies, acute, hyperactive 04/08/2023   Anisocoria - chronic. 04/08/2023   Acute GI bleeding 04/03/2023   Paroxysmal atrial fibrillation with RVR (HCC) 01/26/2023   Essential hypertension    Osteoporosis    E. coli UTI 02/05/2014    Palliative Care Assessment & Plan   Patient Profile: 79 y.o. female  with past medical history of cognitive impairment, CVA, recurrent GI bleeds, paroxysmal A-fib not on anticoagulation, osteoarthritis, severe malnutrition (BMI < 16), and failure to thrive admitted on 03/22/2024 with leg swelling. Patient had recent admission with A-fib with RVR, delirium, and Citrobacter UTI.   In the ER, patient was tachycardic with creatinine 1.26, pro proBNP 4790, and UA with pyuria bacteriuria.    Patient admitted with acute on chronic diastolic CHF, paroxysmal a-fib with RVR and abnormal UA.  On 03/25/24 patient's brother/proxy decision maker transitioned patient to comfort focused care.  Today's Discussion: Patient sitting up in bed she is looking for her dentures. She is more confused today and having trouble understanding the NA is cleaning them. Patient reports discomfort from her acid reflux. Added Tums and notified nursing. Patient required several comfort medications over the last 24 hours including ativan  three times, oxycodone  twice, and dilaudid  once.  Encouraged patient to request prn medications as needed for symptom relief. No family at bedside.  Called patient's brother [name]. Updated him on patient's increased confusion and symptom needs over the last 24 hours. I shared that I do not believe the patient would qualify at this time for inpatient hospice. She continues to eat/drink and her vitals have been stable. We discussed hospice with SNF. He would like to start looking at SNF options. Notified TOC.  Emotional support and therapeutic listening provided. Encouraged family to call PMT with questions or concerns. PMT will continue to follow.   Recommendations/Plan: Comfort focused care Likely dc to SNF with hospice services PMT will continue to support  Symptom Management:  Dilaudid  PRN for pain/air hunger/comfort Robinul  PRN for excessive secretions Ativan  PRN for agitation/anxiety Zofran  PRN for nausea Liquifilm tears PRN for dry eyes Haldol  PRN for agitation/anxiety May have comfort feeding Comfort cart for family Unrestricted visitations in the setting of EOL (per policy) Oxygen PRN 2L or less for comfort. No escalation.    Code Status:    Code Status Orders  (From admission, onward)           Start     Ordered   03/25/24 1127  Do not attempt resuscitation (DNR) - Comfort care  Continuous       Question Answer Comment  If patient has no pulse and is not breathing Do Not Attempt Resuscitation   In Pre-Arrest Conditions (Patient Is Breathing and Has a Pulse) Provide comfort measures. Relieve any mechanical airway obstruction. Avoid transfer unless required for comfort.   Consent: Discussion documented in EHR or advanced directives reviewed      03/25/24 1130           Extensive chart review has been completed prior to seeing the patient including labs, vital signs, progress/consult notes, orders, medications, and available advance directive documents.  Care plan was discussed with bedside RN, TOC, and Dr.  Fairy  Time spent: 35 minutes  Thank you for allowing  the Palliative Medicine Team to assist in the care of this patient.     Stephane CHRISTELLA Palin, NP  Please contact Palliative Medicine Team phone at [phone] for questions and concerns.       "

## 2024-03-28 NOTE — TOC Progression Note (Signed)
 Transition of Care [facility]) - Progression Note    Patient Details  Name: Monica Conway MRN: [id number] Date of Birth: 1945-06-30  Transition [facility]) CM/SW Contact  Glenna Brunkow SHAUNNA [place], [place] Phone Number: 03/28/2024, 11:35 AM  Clinical Narrative:     CSW updated by PMT that pt not appropriate for residential hospice facility and that brother is interested in pursuing private pay SNF with hospice.   CSW spoke with pt's brother and confirmed this plan. He understands SNF would be private pay if pt were to go with hospice. CSW explained medicaid application/spend down process. CSW emailed brother list of SNF offers to review. Will follow up for SNF choice.   Expected Discharge Plan: Skilled Nursing Facility Barriers to Discharge: Continued Medical Work up, SNF Pending bed offer               Expected Discharge Plan and Services In-house Referral: Clinical Social Work   Post Acute Care Choice: Skilled Nursing Facility Living arrangements for the past 2 months: Single Family Home                                       Social Drivers of Health (SDOH) Interventions SDOH Screenings   Food Insecurity: No Food Insecurity (03/24/2024)  Housing: Low Risk (03/24/2024)  Transportation Needs: Unmet Transportation Needs (03/24/2024)  Utilities: Not At Risk (03/24/2024)  Social Connections: Patient Declined (03/24/2024)  Tobacco Use: Low Risk (03/14/2024)    Readmission Risk Interventions    03/11/2024   10:31 AM 10/26/2023    1:57 PM 04/06/2023    8:44 AM  Readmission Risk Prevention Plan  Post Dischage Appt  Complete   Medication Screening  Complete   Transportation Screening Complete Complete Complete  PCP or Specialist Appt within 3-5 Days   Complete  HRI or Home Care Consult   Complete  Social Work Consult for Recovery Care Planning/Counseling   Complete  Palliative Care Screening   Not Applicable  Medication Review Oceanographer) Complete  Complete  PCP or Specialist  appointment within 3-5 days of discharge Complete    HRI or Home Care Consult Complete    SW Recovery Care/Counseling Consult Complete    Palliative Care Screening Complete    Skilled Nursing Facility Complete

## 2024-03-29 DIAGNOSIS — I5033 Acute on chronic diastolic (congestive) heart failure: Secondary | ICD-10-CM | POA: Diagnosis not present

## 2024-03-29 NOTE — Plan of Care (Signed)
 Palliative:  I came by to visit. Ms. Monica Conway was resting in bed and talking on the phone. Notes and MAR reviewed. Agree with LTC with hospice plans.   No charge  Monica Kitty, NP Palliative Medicine Team Pager [phone] (Please see [email or web address] for schedule) Team Phone [phone]

## 2024-03-29 NOTE — Plan of Care (Signed)

## 2024-03-30 ENCOUNTER — Other Ambulatory Visit (HOSPITAL_COMMUNITY): Payer: Self-pay

## 2024-03-30 MED ORDER — QUETIAPINE FUMARATE 25 MG PO TABS: 25.0000 mg | ORAL_TABLET | Freq: Every day | ORAL | Status: AC

## 2024-03-30 MED ORDER — SENNOSIDES-DOCUSATE SODIUM 8.6-50 MG PO TABS: 1.0000 | ORAL_TABLET | Freq: Every day | ORAL | Status: AC

## 2024-03-30 MED ORDER — ACETAMINOPHEN 500 MG PO TABS
500.0000 mg | ORAL_TABLET | Freq: Three times a day (TID) | ORAL | 0 refills | Status: AC | PRN
  Filled 2024-03-30: qty 30, 10d supply, fill #0

## 2024-03-30 NOTE — Plan of Care (Signed)

## 2024-03-30 NOTE — TOC Progression Note (Signed)
 Transition of Care [facility]) - Progression Note    Patient Details  Name: Tyja Gortney MRN: [id number] Date of Birth: 1945-05-29  Transition of Care [facility]) CM/SW Contact  [name], [place] Phone Number: 03/30/2024, 12:03 PM  Clinical Narrative:     [facility] notified CSW that they had a sprinkler system malfunction. They had to shutdown a whole hall and move those residents to different rooms. They no longer have any bed availability.   CSW called and spoke with pt's brother and explained a 2nd choice would need to be made. He agreed to review list and notify CSW with second choice.    Expected Discharge Plan: Skilled Nursing Facility Barriers to Discharge: Continued Medical Work up, SNF Pending bed offer               Expected Discharge Plan and Services In-house Referral: Clinical Social Work   Post Acute Care Choice: Skilled Nursing Facility Living arrangements for the past 2 months: Single Family Home                                       Social Drivers of Health (SDOH) Interventions SDOH Screenings   Food Insecurity: No Food Insecurity (03/24/2024)  Housing: Low Risk (03/24/2024)  Transportation Needs: Unmet Transportation Needs (03/24/2024)  Utilities: Not At Risk (03/24/2024)  Social Connections: Patient Declined (03/24/2024)  Tobacco Use: Low Risk (03/14/2024)    Readmission Risk Interventions    03/11/2024   10:31 AM 10/26/2023    1:57 PM 04/06/2023    8:44 AM  Readmission Risk Prevention Plan  Post Dischage Appt  Complete   Medication Screening  Complete   Transportation Screening Complete Complete Complete  PCP or Specialist Appt within 3-5 Days   Complete  HRI or Home Care Consult   Complete  Social Work Consult for Recovery Care Planning/Counseling   Complete  Palliative Care Screening   Not Applicable  Medication Review Oceanographer) Complete  Complete  PCP or Specialist appointment within 3-5 days of discharge Complete    HRI or Home  Care Consult Complete    SW Recovery Care/Counseling Consult Complete    Palliative Care Screening Complete    Skilled Nursing Facility Complete

## 2024-03-30 NOTE — TOC Progression Note (Signed)
 Transition of Care [facility]) - Progression Note    Patient Details  Name: Monica Conway MRN: [id number] Date of Birth: 10/06/1945  Transition of Care [facility]) CM/SW Contact  [name], [place] Phone Number: 03/30/2024, 4:26 PM  Clinical Narrative:     Pt's brother's alternative SNF choice is [facility]. [facility] confirmed bed offer. They will call pt's brother to discuss next steps.   Expected Discharge Plan: Skilled Nursing Facility Barriers to Discharge: Continued Medical Work up, SNF Pending bed offer               Expected Discharge Plan and Services In-house Referral: Clinical Social Work   Post Acute Care Choice: Skilled Nursing Facility Living arrangements for the past 2 months: Single Family Home                                       Social Drivers of Health (SDOH) Interventions SDOH Screenings   Food Insecurity: No Food Insecurity (03/24/2024)  Housing: Low Risk (03/24/2024)  Transportation Needs: Unmet Transportation Needs (03/24/2024)  Utilities: Not At Risk (03/24/2024)  Social Connections: Patient Declined (03/24/2024)  Tobacco Use: Low Risk (03/14/2024)    Readmission Risk Interventions    03/11/2024   10:31 AM 10/26/2023    1:57 PM 04/06/2023    8:44 AM  Readmission Risk Prevention Plan  Post Dischage Appt  Complete   Medication Screening  Complete   Transportation Screening Complete Complete Complete  PCP or Specialist Appt within 3-5 Days   Complete  HRI or Home Care Consult   Complete  Social Work Consult for Recovery Care Planning/Counseling   Complete  Palliative Care Screening   Not Applicable  Medication Review Oceanographer) Complete  Complete  PCP or Specialist appointment within 3-5 days of discharge Complete    HRI or Home Care Consult Complete    SW Recovery Care/Counseling Consult Complete    Palliative Care Screening Complete    Skilled Nursing Facility Complete

## 2024-03-30 NOTE — Discharge Summary (Signed)
 Physician Discharge Summary  [name] FMW:[id number] DOB: September 25, 1945 DOA: 03/22/2024  PCP: [facility]  Admit date: 03/22/2024 Discharge date: 03/30/2024  Time spent:45 minutes  Recommendations for Outpatient Follow-up:  SNF/long-term care with hospice   Discharge Diagnoses:  Principal Problem:   Acute on chronic diastolic heart failure (HCC)   Paroxysmal atrial fibrillation with RVR (HCC) Delirium, cognitive deficits Malnutrition History of CVA History of recurrent GI bleeds History of UTI Adult failure to thrive   Hypotension   Goals of care, counseling/discussion   Discharge Condition: Fair, guarded prognosis  Diet recommendation: Comfort feeds  Filed Weights   03/23/24 1136 03/24/24 0300 03/25/24 0437  Weight: 39.8 kg 38 kg 38 kg    History of present illness:  Frail debilitated [name] with history of CVA, cognitive deficits, recurrent GI bleeds, paroxysmal A-fib not on anticoagulation, osteoarthritis, severe malnutrition, recent admission with A-fib RVR delirium, Citrobacter UTI, seen by palliative care, initially agreed to SNF and then patient insisted on discharge home with additional home care services. - Back in the ED 1/27 with leg swelling, in the ER tachycardic creatinine 1.26, pro proBNP 4790, UA with pyuria bacteriuria -Admitted, started on antibiotics and diuretics - 1/30: Palliative care meeting, switched to comfort care - TOC following, plan for long-term care with hospice  Hospital Course:   Acute on chronic diastolic CHF Hypoalbuminemia, third spacing - Recent echo 1/9 noted EF 55-60%, normal RV - Volume up mildly this admission, compounded by third spacing - GDMT limited by hypotension - Now comfort care, plan for long-term care with hospice, TOC following   Paroxysmal A-fib with RVR - Briefly used Cardizem  gtt. stopped for hypotension, then started on Amio gtt. - Not on anticoagulation due to history of GI bleed and frequent  falls - Limited options - Now comfort care   Abnormal UA Staph hemolyticus UTI Recent Citrobacter UTI, delirium - Mild leukocytosis on admission, UA is abnormal, started on ceftriaxone , -Urine culture with staph hemolyticus, likely a contaminant, completed 3 days of ceftriaxone  - Now comfort care   Hyperkalemia -Resolved   Failure to thrive Severe malnutrition Cognitive decline, delirium - Do not anticipate significant improvement in her overall condition, palliative reconsulted  - Continue Seroquel  - Seen by palliative care this admission, now comfort care, disposition is being pursued, plan for long-term care with hospice  Discharge Exam: Vitals:   03/29/24 2011 03/30/24 0800  BP: 132/73 (!) 147/91  Pulse: 79 75  Resp: 18 18  Temp: 98.7 F (37.1 C) 98.1 F (36.7 C)  SpO2: 97% 98%   Frail elderly female laying in bed, awake alert, oriented to self and partly to place, cognitive deficits CVS: S1-S2, irregular rhythm Lungs: Clear anteriorly Abdomen: Soft, nontender, bowel sounds present Extremities: No edema, chronic skin changes  Discharge Instructions    Allergies as of 03/30/2024       Reactions   Pine Shortness Of Breath   Pollen Extract Shortness Of Breath   Caduet [amlodipine -atorvastatin] Other (See Comments)   Unknown reaction        Medication List     STOP taking these medications    diltiazem  120 MG 24 hr capsule Commonly known as: CARDIZEM  CD   diltiazem  180 MG 24 hr capsule Commonly known as: CARDIZEM  CD   lidocaine  5 % Commonly known as: LIDODERM    magnesium  oxide 400 (240 Mg) MG tablet Commonly known as: MAG-OX   metoprolol  succinate 25 MG 24 hr tablet Commonly known as: TOPROL -XL   metoprolol  succinate  50 MG 24 hr tablet Commonly known as: TOPROL -XL   potassium chloride  SA 20 MEQ tablet Commonly known as: KLOR-CON  M   sodium bicarbonate  650 MG tablet   traMADol  50 MG tablet Commonly known as: ULTRAM        TAKE these  medications    acetaminophen  500 MG tablet Commonly known as: TYLENOL  Take 1 tablet (500 mg total) by mouth every 8 (eight) hours as needed for mild pain (pain score 1-3). What changed:  how much to take when to take this reasons to take this   albuterol  108 (90 Base) MCG/ACT inhaler Commonly known as: VENTOLIN  HFA Inhale 2 puffs into the lungs every 6 (six) hours as needed for wheezing or shortness of breath.   feeding supplement Liqd Take 237 mLs by mouth 2 (two) times daily between meals.   mirabegron  ER 50 MG Tb24 tablet Commonly known as: MYRBETRIQ  Take 50 mg by mouth daily.   pantoprazole  40 MG tablet Commonly known as: PROTONIX  Take 1 tablet (40 mg total) by mouth 2 (two) times daily.   QUEtiapine  25 MG tablet Commonly known as: SEROQUEL  Take 1 tablet (25 mg total) by mouth at bedtime. What changed:  when to take this Another medication with the same name was removed. Continue taking this medication, and follow the directions you see here.   senna-docusate 8.6-50 MG tablet Commonly known as: Senokot-S Take 1 tablet by mouth at bedtime.       Allergies[1]    The results of significant diagnostics from this hospitalization (including imaging, microbiology, ancillary and laboratory) are listed below for reference.    Significant Diagnostic Studies: ECHOCARDIOGRAM LIMITED Result Date: 03/24/2024    ECHOCARDIOGRAM LIMITED REPORT   Patient Name:   Monica Conway Date of Exam: 03/24/2024 Medical Rec #:  [id number]         Height:       62.0 in Accession #:    [id number]        Weight:       83.8 lb Date of Birth:  1945-11-23         BSA:          1.321 m Patient Age:    78 years          BP:           102/61 mmHg Patient Gender: F                 HR:           72 bpm. Exam Location:  Inpatient Procedure: Limited Echo, Cardiac Doppler and Color Doppler (Both Spectral and            Color Flow Doppler were utilized during procedure). Indications:    Acute on chronic  heart failure  History:        Patient has prior history of Echocardiogram examinations, most                 recent 03/04/2024. CHF, Arrythmias:Atrial Fibrillation,                 Signs/Symptoms:Hypotension; Risk Factors:Hypertension.  Sonographer:    Juliene Rucks Referring Phys: [id number] MARSHA ADA  Sonographer Comments: Technically difficult study due to poor echo windows. Image acquisition challenging due to uncooperative patient. IMPRESSIONS  1. Left ventricular ejection fraction, by estimation, is 60 to 65%. The left ventricle has normal function. The left ventricle has no regional wall motion abnormalities. There is mild left ventricular hypertrophy of the  basal-septal segment. Left ventricular diastolic parameters are consistent with Grade II diastolic dysfunction (pseudonormalization). Elevated left atrial pressure.  2. Right ventricular systolic function was not well visualized.  3. The aortic valve was not well visualized. There is moderate calcification of the aortic valve. There is moderate thickening of the aortic valve. Doppler of the AV was not performed. FINDINGS  Left Ventricle: Left ventricular ejection fraction, by estimation, is 60 to 65%. The left ventricle has normal function. The left ventricle has no regional wall motion abnormalities. There is mild left ventricular hypertrophy of the basal-septal segment. Left ventricular diastolic parameters are consistent with Grade II diastolic dysfunction (pseudonormalization). Elevated left atrial pressure. Right Ventricle: Right ventricular systolic function was not well visualized. Aortic Valve: The aortic valve was not well visualized. There is moderate calcification of the aortic valve. There is moderate thickening of the aortic valve. LEFT VENTRICLE PLAX 2D LVIDd:         4.10 cm   Diastology LVIDs:         3.00 cm   LV e' medial:    4.33 cm/s LV PW:         0.90 cm   LV E/e' medial:  18.3 LV IVS:        1.20 cm   LV e' lateral:   6.36 cm/s LVOT  diam:     1.90 cm   LV E/e' lateral: 12.4 LVOT Area:     2.84 cm  LEFT ATRIUM             Index LA diam:        3.10 cm 2.35 cm/m LA Vol (A2C):   17.9 ml 13.55 ml/m LA Vol (A4C):   29.1 ml 22.03 ml/m LA Biplane Vol: 22.4 ml 16.96 ml/m   AORTA Ao Root diam: 2.90 cm MITRAL VALVE MV Area (PHT): 4.31 cm    SHUNTS MV Decel Time: 176 msec    Systemic Diam: 1.90 cm MV E velocity: 79.10 cm/s MV A velocity: 83.40 cm/s MV E/A ratio:  0.95 Wilbert Bihari MD Electronically signed by Wilbert Bihari MD Signature Date/Time: 03/24/2024/7:32:46 PM    Final    DG Chest 2 View Result Date: 03/22/2024 EXAM: 2 VIEW(S) XRAY OF THE CHEST 03/22/2024 11:04:01 PM COMPARISON: 03/03/2024 CLINICAL HISTORY: Shortness of breath, volume overload. FINDINGS: LUNGS AND PLEURA: No focal pulmonary opacity. No pleural effusion. No pneumothorax. HEART AND MEDIASTINUM: Calcification of the aorta. Large esophageal hiatal hernia behind the heart. Heart size and pulmonary vascularity are normal. Mediastinal contours appear intact. BONES AND SOFT TISSUES: Degenerative changes in the spine and shoulders. Compression deformity of a lower thoracic vertebra, unchanged since the prior study. IMPRESSION: 1. No acute cardiopulmonary findings. 2. Large esophageal hiatal hernia. Electronically signed by: Elsie Gravely MD 03/22/2024 11:15 PM EST RP Workstation: [id number]   ECHOCARDIOGRAM COMPLETE Result Date: 03/04/2024    ECHOCARDIOGRAM REPORT   Patient Name:   PATSIE MCCARDLE Date of Exam: 03/04/2024 Medical Rec #:  [id number]         Height:       62.0 in Accession #:    [id number]        Weight:       91.3 lb Date of Birth:  07-Dec-1945         BSA:          1.370 m Patient Age:    78 years          BP:  154/95 mmHg Patient Gender: F                 HR:           56 bpm. Exam Location:  Inpatient Procedure: 2D Echo, Cardiac Doppler and Color Doppler (Both Spectral and Color            Flow Doppler were utilized during procedure). Indications:     Elevated troponin  History:        Patient has prior history of Echocardiogram examinations, most                 recent 01/25/2023. Arrythmias:Atrial Fibrillation; Risk                 Factors:Hypertension.  Sonographer:    Merlynn Argyle Referring Phys: [id number] CAROLE N HALL  Sonographer Comments: Technically difficult study due to poor echo windows. Image acquisition challenging due to uncooperative patient and Image acquisition challenging due to respiratory motion. IMPRESSIONS  1. Left ventricular ejection fraction, by estimation, is 55 to 60%. The left ventricle has normal function. The left ventricle has no regional wall motion abnormalities. Left ventricular diastolic parameters were normal.  2. Right ventricular systolic function is low normal. The right ventricular size is mildly enlarged. Tricuspid regurgitation signal is inadequate for assessing PA pressure.  3. The mitral valve is degenerative. No evidence of mitral valve regurgitation. No evidence of mitral stenosis.  4. The aortic valve is calcified. Aortic valve regurgitation is not visualized. Mild aortic valve stenosis. Aortic valve area, by VTI measures 1.20 cm. Aortic valve mean gradient measures 9.0 mmHg. Aortic valve Vmax measures 2.28 m/s. Comparison(s): A prior study was performed on 01/25/2023. There was grade I diastolic dysfunction with similar systolic function and mild to moderate aortic stenosis with an AV Vmax 2.8 m/s. FINDINGS  Left Ventricle: Left ventricular ejection fraction, by estimation, is 55 to 60%. The left ventricle has normal function. The left ventricle has no regional wall motion abnormalities. The left ventricular internal cavity size was normal in size. There is  no left ventricular hypertrophy. Left ventricular diastolic parameters were normal. Right Ventricle: The right ventricular size is mildly enlarged. No increase in right ventricular wall thickness. Right ventricular systolic function is low normal. Tricuspid  regurgitation signal is inadequate for assessing PA pressure. Left Atrium: Left atrial size was normal in size. Right Atrium: Right atrial size was normal in size. Pericardium: There is no evidence of pericardial effusion. Mitral Valve: The mitral valve is degenerative in appearance. Mild mitral annular calcification. No evidence of mitral valve regurgitation. No evidence of mitral valve stenosis. Tricuspid Valve: The tricuspid valve is normal in structure. Tricuspid valve regurgitation is not demonstrated. No evidence of tricuspid stenosis. Aortic Valve: The aortic valve is calcified. Aortic valve regurgitation is not visualized. Mild aortic stenosis is present. Aortic valve mean gradient measures 9.0 mmHg. Aortic valve peak gradient measures 20.8 mmHg. Aortic valve area, by VTI measures 1.20 cm. Pulmonic Valve: The pulmonic valve was normal in structure. Pulmonic valve regurgitation is not visualized. No evidence of pulmonic stenosis. Aorta: The aortic root is normal in size and structure. Venous: The inferior vena cava was not well visualized. IAS/Shunts: No atrial level shunt detected by color flow Doppler.  LEFT VENTRICLE PLAX 2D LVOT diam:     1.70 cm   Diastology LV SV:         47        LV e' medial:    7.07 cm/s  LV SV Index:   34        LV E/e' medial:  10.6 LVOT Area:     2.27 cm  LV e' lateral:   8.70 cm/s                          LV E/e' lateral: 8.6  RIGHT VENTRICLE RV Basal diam:  3.00 cm RV S prime:     12.20 cm/s TAPSE (M-mode): 1.6 cm LEFT ATRIUM             Index        RIGHT ATRIUM          Index LA Vol (A2C):   12.5 ml 9.13 ml/m   RA Area:     7.59 cm LA Vol (A4C):   13.1 ml 9.56 ml/m   RA Volume:   13.80 ml 10.08 ml/m LA Biplane Vol: 13.9 ml 10.15 ml/m  AORTIC VALVE AV Area (Vmax):    0.85 cm AV Area (Vmean):   0.86 cm AV Area (VTI):     1.20 cm AV Vmax:           228.00 cm/s AV Vmean:          132.000 cm/s AV VTI:            0.394 m AV Peak Grad:      20.8 mmHg AV Mean Grad:      9.0  mmHg LVOT Vmax:         85.40 cm/s LVOT Vmean:        50.000 cm/s LVOT VTI:          0.208 m LVOT/AV VTI ratio: 0.53  AORTA Ao Root diam: 3.00 cm MITRAL VALVE MV Area (PHT): 2.90 cm    SHUNTS MV Decel Time: 262 msec    Systemic VTI:  0.21 m MV E velocity: 75.20 cm/s  Systemic Diam: 1.70 cm MV A velocity: 83.60 cm/s MV E/A ratio:  0.90 Emeline Calender Electronically signed by Emeline Calender Signature Date/Time: 03/04/2024/6:03:09 PM    Final    DG Chest Portable 1 View Result Date: 03/03/2024 EXAM: 1 VIEW(S) XRAY OF THE CHEST 03/03/2024 08:13:00 PM COMPARISON: 12/18/2023 CLINICAL HISTORY: sepsis FINDINGS: LUNGS AND PLEURA: No focal pulmonary opacity. No pleural effusion. No pneumothorax. HEART AND MEDIASTINUM: Aortic arch calcifications. BONES AND SOFT TISSUES: Large hiatal hernia. No acute osseous abnormality. IMPRESSION: 1. No acute cardiopulmonary abnormality. Electronically signed by: Pinkie Pebbles MD MD 03/03/2024 08:22 PM EST RP Workstation: [id number]   CT Head Wo Contrast Result Date: 03/03/2024 CLINICAL DATA:  Altered mental status EXAM: CT HEAD WITHOUT CONTRAST TECHNIQUE: Contiguous axial images were obtained from the base of the skull through the vertex without intravenous contrast. RADIATION DOSE REDUCTION: This exam was performed according to the departmental dose-optimization program which includes automated exposure control, adjustment of the mA and/or kV according to patient size and/or use of iterative reconstruction technique. COMPARISON:  Head CT 12/18/2023, MRI 01/24/2023 FINDINGS: Brain: Limited by scan technique and patient positioning. Allowing for this, no gross territorial infarct, hemorrhage or intracranial mass is seen. Chronic infarct in the right basal ganglia. Chronic right occipital infarct. Chronic small vessel ischemic changes of the white matter. No ventricular enlargement is seen Vascular: No unexpected calcification Skull: No obvious fracture Sinuses/Orbits: No acute finding Other:  None IMPRESSION: 1. Limited exam due to scan technique and patient positioning. Allowing for this, no gross acute intracranial abnormality is seen. 2. Chronic  infarcts in the right basal ganglia and right occipital lobe. Chronic small vessel ischemic changes of the white matter. Electronically Signed   By: Luke Bun M.D.   On: 03/03/2024 17:48    Microbiology: Recent Results (from the past 240 hours)  Urine Culture     Status: Abnormal   Collection Time: 03/22/24  9:16 PM   Specimen: Urine, Random  Result Value Ref Range Status   Specimen Description URINE, RANDOM  Final   Special Requests   Final    NONE Reflexed from [id number] Performed at [facility], [address], [place], [place] [address]    Culture >=100,000 COLONIES/mL STAPHYLOCOCCUS HAEMOLYTICUS (A)  Final   Report Status 03/25/2024 FINAL  Final   Organism ID, Bacteria STAPHYLOCOCCUS HAEMOLYTICUS (A)  Final      Susceptibility   Staphylococcus haemolyticus - MIC*    CIPROFLOXACIN >=8 RESISTANT Resistant     GENTAMICIN >=16 RESISTANT Resistant     NITROFURANTOIN <=16 SENSITIVE Sensitive     OXACILLIN >=4 RESISTANT Resistant     TETRACYCLINE >=16 RESISTANT Resistant     VANCOMYCIN  1 SENSITIVE Sensitive     TRIMETH/SULFA 160 RESISTANT Resistant     RIFAMPIN >=32 RESISTANT Resistant     Inducible Clindamycin NEGATIVE Sensitive     * >=100,000 COLONIES/mL STAPHYLOCOCCUS HAEMOLYTICUS     Labs: Basic Metabolic Panel: Recent Labs  Lab 03/24/24 1103 03/25/24 0247  NA 139 139  K 4.0 3.4*  CL 104 104  CO2 24 25  GLUCOSE 96 79  BUN 35* 32*  CREATININE 1.61* 1.44*  CALCIUM  8.1* 7.5*   Liver Function Tests: No results for input(s): AST, ALT, ALKPHOS, BILITOT, PROT, ALBUMIN in the last 168 hours. No results for input(s): LIPASE, AMYLASE in the last 168 hours. No results for input(s): AMMONIA in the last 168 hours. CBC: Recent Labs  Lab 03/24/24 1103 03/25/24 0247  WBC 8.0 8.3  HGB 11.9*  10.4*  HCT 36.8 31.8*  MCV 96.3 95.2  PLT 394 331   Cardiac Enzymes: No results for input(s): CKTOTAL, CKMB, CKMBINDEX, TROPONINI in the last 168 hours. BNP: BNP (last 3 results) No results for input(s): BNP in the last 8760 hours.  ProBNP (last 3 results) Recent Labs    03/03/24 1817 03/22/24 1901 03/23/24 0525  PROBNP 3,427.0* 4,058.0* 4,790.0*    CBG: Recent Labs  Lab 03/25/24 1607  GLUCAP 85       Signed:  Sigurd Pac MD.  [facility] 03/30/2024, 10:03 AM       [1]  Allergies Allergen Reactions   Pine Shortness Of Breath   Pollen Extract Shortness Of Breath   Caduet [Amlodipine -Atorvastatin] Other (See Comments)    Unknown reaction

## 2024-03-31 NOTE — Plan of Care (Signed)
" °  Problem: Health Behavior/Discharge Planning: Goal: Ability to manage health-related needs will improve Outcome: Progressing   Problem: Clinical Measurements: Goal: Ability to maintain clinical measurements within normal limits will improve Outcome: Progressing   Problem: Activity: Goal: Risk for activity intolerance will decrease Outcome: Progressing   Problem: Safety: Goal: Ability to remain free from injury will improve Outcome: Progressing   Problem: Skin Integrity: Goal: Risk for impaired skin integrity will decrease Outcome: Progressing   Problem: Pain Management: Goal: Satisfaction with pain management regimen will improve Outcome: Progressing   "

## 2024-03-31 NOTE — TOC Progression Note (Addendum)
 Transition of Care [facility] - Progression Note    Patient Details  Name: Monica Conway MRN: [id number] Date of Birth: 1945-05-20  [facility] CM/SW Contact  [name], [place] Phone Number: 03/31/2024, 10:03 AM  Clinical Narrative:     Pt's brother meeting with [facility] admissions to complete paperwork/financials.   1200: Notified by Kathlean Milian that they are good to admit pt but would like to pursue STR for initial admission and would require insurance auth to be started prior to pt admitting. PT to see pt and then auth can be initiated. CSW spoke with pt brother and he is agreeable to plan. Plan for pt to admit tomorrow. Maple Milian will arrange [facility]   Expected Discharge Plan: Skilled Nursing Facility Barriers to Discharge: SNF pending brother completing financials with facility               Expected Discharge Plan and Services In-house Referral: Clinical Social Work   Post Acute Care Choice: Skilled Nursing Facility Living arrangements for the past 2 months: Single Family Home                                       Social Drivers of Health (SDOH) Interventions SDOH Screenings   Food Insecurity: No Food Insecurity (03/24/2024)  Housing: Low Risk (03/24/2024)  Transportation Needs: Unmet Transportation Needs (03/24/2024)  Utilities: Not At Risk (03/24/2024)  Social Connections: Patient Declined (03/24/2024)  Tobacco Use: Low Risk (03/14/2024)    Readmission Risk Interventions    03/11/2024   10:31 AM 10/26/2023    1:57 PM 04/06/2023    8:44 AM  Readmission Risk Prevention Plan  Post Dischage Appt  Complete   Medication Screening  Complete   Transportation Screening Complete Complete Complete  PCP or Specialist Appt within 3-5 Days   Complete  HRI or Home Care Consult   Complete  Social Work Consult for Recovery Care Planning/Counseling   Complete  Palliative Care Screening   Not Applicable  Medication Review Oceanographer)  Complete  Complete  PCP or Specialist appointment within 3-5 days of discharge Complete    HRI or Home Care Consult Complete    SW Recovery Care/Counseling Consult Complete    Palliative Care Screening Complete    Skilled Nursing Facility Complete

## 2024-03-31 NOTE — Progress Notes (Signed)
 Physical Therapy Treatment Patient Details Name: Monica Conway MRN: [id number] DOB: 1945-11-16 Today's Date: 03/31/2024   History of Present Illness Pt is a 79 y.o. female presents to [facility] 03/22/24 due to SOB and BLE edema.Pt admitted due to acute on chronic diastolic heart failure. Pt had a recent admission from 1/8-1/19. PMHx: paroxysmal A-fib not on anticoagulation due to history of GI bleed, hypertension, history of CVA, Barrett's esophagus, osteoporosis, bilateral knee osteoarthritis, severe protein calorie malnutrition    PT Comments  Pt is currently Min A for bed mobility, 2 person Min A for sit to stand but unable to progress gait due to strength/balance deficits with heavy UE support on RW. Pt requires elevated EOB to perform sit to stand. Pt has limited assistance at home. Due to pt current functional status, home set up and available assistance at home recommending skilled physical therapy services < 3 hours/day in order to address strength, balance and functional mobility to decrease risk for falls, injury, immobility, skin break down and re-hospitalization.      If plan is discharge home, recommend the following: Help with stairs or ramp for entrance;Assist for transportation;Supervision due to cognitive status;A lot of help with walking and/or transfers;Assistance with cooking/housework   Can travel by private vehicle     No  Equipment Recommendations  BSC/3in1;Rolling walker (2 wheels)       Precautions / Restrictions Precautions Precautions: Fall Recall of Precautions/Restrictions: Impaired Restrictions Weight Bearing Restrictions Per Provider Order: No     Mobility  Bed Mobility Overal bed mobility: Needs Assistance Bed Mobility: Supine to Sit, Sit to Supine   Sidelying to sit: Min assist Supine to sit: Min assist     General bed mobility comments: Min A for trunk to mid line and for LE back to EOB with multi modal cues for sequencing in order to decrease need  for physical assist. pt was able to scoot up EOB with increased time at 1 person Min A    Transfers Overall transfer level: Needs assistance Equipment used: Rolling walker (2 wheels) Transfers: Sit to/from Stand Sit to Stand: Min assist, +2 safety/equipment, +2 physical assistance, From elevated surface           General transfer comment: 2 person assist for sit to stand from elevated EOB with increased time and bil feet blocked with posterior lean and cues for sequencing/body mechanics throughout performed 2x    Ambulation/Gait               General Gait Details: unable to progress feet anterior or laterally for stepping due to difficulty wgt shifting due to weakness      Balance Overall balance assessment: Needs assistance Sitting-balance support: Feet supported Sitting balance-Leahy Scale: Fair   Postural control: Posterior lean Standing balance support: Bilateral upper extremity supported, Reliant on assistive device for balance Standing balance-Leahy Scale: Poor Standing balance comment: Min A +2      Communication Communication Communication: Impaired Factors Affecting Communication: Hearing impaired  Cognition Arousal: Alert Behavior During Therapy: Agitated, Anxious   PT - Cognitive impairments: No family/caregiver present to determine baseline, Memory, Initiation, Sequencing, Problem solving, Safety/Judgement     PT - Cognition Comments: pt perseverating on the nurse stating she won't let her wear breifs and very concerned about what she can do about this situation despite education that pts are not to be left in bed with briefs per hospital policy Following commands: Impaired Following commands impaired: Follows one step commands inconsistently, Follows one step commands  with increased time    Cueing Cueing Techniques: Verbal cues, Gestural cues, Tactile cues     General Comments General comments (skin integrity, edema, etc.): Pt denies dizziness       Pertinent Vitals/Pain Pain Assessment Pain Assessment: No/denies pain    Home Living Family/patient expects to be discharged to:: Private residence Living Arrangements: Alone (per chart has a caregiver  7 days a week x 5 hours) Available Help at Discharge: Other (Comment) Type of Home: House Home Access: Ramped entrance       Home Layout: One level Home Equipment: Agricultural Consultant (2 wheels);Wheelchair - manual;Shower seat;Grab bars - tub/shower;Hand held shower head;BSC/3in1 Additional Comments: above info per recent admission 1 month        PT Goals (current goals can now be found in the care plan section) Acute Rehab PT Goals PT Goal Formulation: Patient unable to participate in goal setting Time For Goal Achievement: 04/14/24 Potential to Achieve Goals: Fair    Frequency    Min 1X/week      PT Plan  Updated frequency       AM-PAC PT 6 Clicks Mobility   Outcome Measure  Help needed turning from your back to your side while in a flat bed without using bedrails?: A Little Help needed moving from lying on your back to sitting on the side of a flat bed without using bedrails?: A Little Help needed moving to and from a bed to a chair (including a wheelchair)?: A Lot Help needed standing up from a chair using your arms (e.g., wheelchair or bedside chair)?: A Lot Help needed to walk in hospital room?: Total Help needed climbing 3-5 steps with a railing? : Total 6 Click Score: 12    End of Session Equipment Utilized During Treatment: Gait belt Activity Tolerance: Patient tolerated treatment well Patient left: in bed;with call bell/phone within reach;with bed alarm set Nurse Communication: Mobility status PT Visit Diagnosis: Other abnormalities of gait and mobility (R26.89);Muscle weakness (generalized) (M62.81);Adult, failure to thrive (R62.7);History of falling (Z91.81)     Time: 1421-1440 PT Time Calculation (min) (ACUTE ONLY): 19 min  Charges:     $Therapeutic Activity: 8-22 mins PT General Charges $$ ACUTE PT VISIT: 1 Visit                    Dorothyann Maier, DPT, CLT  Acute Rehabilitation Services Office: [phone] (Secure chat preferred)    Dorothyann VEAR Maier 03/31/2024, 3:25 PM

## 2024-03-31 NOTE — Progress Notes (Signed)
 " PROGRESS NOTE    Alizah Sills  FMW:[id number] DOB: Jan 16, 1946 DOA: 03/22/2024 PCP: [facility]  Subjective: Patient reports feeling okay.    Hospital Course: [name] with history of CVA, cognitive deficits, recurrent GI bleeds, paroxysmal A-fib not on anticoagulation, osteoarthritis, severe malnutrition, recent admission with A-fib RVR delirium, Citrobacter UTI, seen by palliative care, initially agreed to SNF and then patient insisted on discharge home with additional home care services. - Back in the ED 1/27 with leg swelling, in the ER tachycardic creatinine 1.26, pro proBNP 4790, UA with pyuria bacteriuria -Admitted, started on antibiotics and diuretics - 1/30: Palliative care meeting, switched to comfort care - TOC following, plan for long-term care with outpatient palliative services   Assessment and Plan:  Acute on chronic diastolic CHF Hypoalbuminemia, third spacing - Recent echo 1/9 noted EF 55-60%, normal RV - Volume up mildly this admission, compounded by third spacing - GDMT limited by hypotension - Now comfort care, plan for long-term care with palliative services (not hospice anymore), TOC following   Paroxysmal A-fib with RVR - Briefly used Cardizem  gtt. stopped for hypotension, then started on Amio gtt which was then stopped. - Not on anticoagulation due to history of GI bleed and frequent falls - Limited options   Abnormal UA Staph hemolyticus UTI Recent Citrobacter UTI, delirium - Mild leukocytosis on admission, UA is abnormal, started on ceftriaxone , -Urine culture with staph hemolyticus, likely a contaminant, completed 3 days of ceftriaxone  - Now comfort care   Hyperkalemia -Resolved   Failure to thrive Severe malnutrition Cognitive decline, delirium - Do not anticipate significant improvement in her overall condition - Continue Seroquel  - Seen by palliative care this admission, now comfort care, disposition is being pursued, plan for  long-term care with outpatient palliative care  Initial plan was for [facility] but they had a sprinkler system malfunction and no longer had bed availability. [facility] able to admit patient tomorrow and plan for discharge tomorrow     DVT prophylaxis:   None because of comfort care   Code Status: Do not attempt resuscitation (DNR) - Comfort care Disposition Plan: SNF Reason for continuing need for hospitalization: plan for discharge tomorrow  Objective: Vitals:   03/30/24 1951 03/31/24 0826 03/31/24 0851 03/31/24 1601  BP: 133/89 117/69 (!) 101/58 105/65  Pulse: 82 75 74 81  Resp: 16  16 16   Temp: 98 F (36.7 C)  97.8 F (36.6 C) 98.4 F (36.9 C)  TempSrc:    Oral  SpO2: 97% 99% 96% 98%  Weight:      Height:        Intake/Output Summary (Last 24 hours) at 03/31/2024 1643 Last data filed at 03/31/2024 1207 Gross per 24 hour  Intake 240 ml  Output 100 ml  Net 140 ml   Filed Weights   03/23/24 1136 03/24/24 0300 03/25/24 0437  Weight: 39.8 kg 38 kg 38 kg    Examination:  Physical Exam Vitals and nursing note reviewed.  Constitutional:      General: She is not in acute distress.    Comments: Frail appearing  Cardiovascular:     Rate and Rhythm: Normal rate.  Pulmonary:     Effort: No respiratory distress.  Abdominal:     General: There is no distension.     Data Reviewed: I have personally reviewed following labs and imaging studies  CBC: Recent Labs  Lab 03/25/24 0247  WBC 8.3  HGB 10.4*  HCT 31.8*  MCV 95.2  PLT  331   Basic Metabolic Panel: Recent Labs  Lab 03/25/24 0247  NA 139  K 3.4*  CL 104  CO2 25  GLUCOSE 79  BUN 32*  CREATININE 1.44*  CALCIUM  7.5*   GFR: Estimated Creatinine Clearance: 19.6 mL/min (A) (by C-G formula based on SCr of 1.44 mg/dL (H)). Liver Function Tests: No results for input(s): AST, ALT, ALKPHOS, BILITOT, PROT, ALBUMIN in the last 168 hours. No results for input(s): LIPASE, AMYLASE in the last  168 hours. No results for input(s): AMMONIA in the last 168 hours. Coagulation Profile: No results for input(s): INR, PROTIME in the last 168 hours. Cardiac Enzymes: No results for input(s): CKTOTAL, CKMB, CKMBINDEX, TROPONINI in the last 168 hours. ProBNP, BNP (last 5 results) Recent Labs    03/03/24 1817 03/22/24 1901 03/23/24 0525  PROBNP 3,427.0* 4,058.0* 4,790.0*   HbA1C: No results for input(s): HGBA1C in the last 72 hours. CBG: Recent Labs  Lab 03/25/24 1607  GLUCAP 85   Lipid Profile: No results for input(s): CHOL, HDL, LDLCALC, TRIG, CHOLHDL, LDLDIRECT in the last 72 hours. Thyroid Function Tests: No results for input(s): TSH, T4TOTAL, FREET4, T3FREE, THYROIDAB in the last 72 hours. Anemia Panel: No results for input(s): VITAMINB12, FOLATE, FERRITIN, TIBC, IRON , RETICCTPCT in the last 72 hours. Sepsis Labs: No results for input(s): PROCALCITON, LATICACIDVEN in the last 168 hours.  Recent Results (from the past 240 hours)  Urine Culture     Status: Abnormal   Collection Time: 03/22/24  9:16 PM   Specimen: Urine, Random  Result Value Ref Range Status   Specimen Description URINE, RANDOM  Final   Special Requests   Final    NONE Reflexed from [id number] Performed at [facility], [address] [place], [place] [address]    Culture >=100,000 COLONIES/mL STAPHYLOCOCCUS HAEMOLYTICUS (A)  Final   Report Status 03/25/2024 FINAL  Final   Organism ID, Bacteria STAPHYLOCOCCUS HAEMOLYTICUS (A)  Final      Susceptibility   Staphylococcus haemolyticus - MIC*    CIPROFLOXACIN >=8 RESISTANT Resistant     GENTAMICIN >=16 RESISTANT Resistant     NITROFURANTOIN <=16 SENSITIVE Sensitive     OXACILLIN >=4 RESISTANT Resistant     TETRACYCLINE >=16 RESISTANT Resistant     VANCOMYCIN  1 SENSITIVE Sensitive     TRIMETH/SULFA 160 RESISTANT Resistant     RIFAMPIN >=32 RESISTANT Resistant     Inducible Clindamycin NEGATIVE  Sensitive     * >=100,000 COLONIES/mL STAPHYLOCOCCUS HAEMOLYTICUS     Radiology Studies: No results found.  Scheduled Meds:  amiodarone   200 mg Oral Daily   antiseptic oral rinse  15 mL Topical BID   Gerhardt's butt cream  1 Application Topical QID   pantoprazole   40 mg Oral BID   phenazopyridine   100 mg Oral TID WC   polyethylene glycol  17 g Oral Daily   QUEtiapine   25 mg Oral QHS   senna-docusate  1 tablet Oral QHS   Continuous Infusions:   LOS: 8 days   Time spent: 40 minutes  Casimer Dare, MD  [facility]  03/31/2024, 4:43 PM   "

## 2024-03-31 NOTE — Progress Notes (Signed)
 Palliative:  HPI: 79 y.o. female  with past medical history of cognitive impairment, CVA, recurrent GI bleeds, paroxysmal A-fib not on anticoagulation, osteoarthritis, severe malnutrition (BMI < 16), and failure to thrive admitted on 03/22/2024 with leg swelling. Patient had recent admission with A-fib with RVR, delirium, and Citrobacter UTI. In the ER, patient was tachycardic with creatinine 1.26, pro proBNP 4790, and UA with pyuria bacteriuria. Patient admitted with acute on chronic diastolic CHF, paroxysmal a-fib with RVR and abnormal UA. On 03/25/24 patient's brother/proxy decision maker transitioned patient to comfort focused care. Awaiting LTC bed - CSW following.   Ms. Mohamud is known to me from previous admission. I met today with her brother, [name], as well as herself. She is dressed and sitting up in chair. She is pleasant and welcoming of my presence today. They are awaiting facility representative to come speak with them about LTC bed transition from [facility]. Ms. Kott asks about her medications and requests that her medications be limited to as minimal amount as possible - after review no changes made. [facility] representative present and Ms. Fencl asks about physical therapy. She expresses desire for physical therapy and willingness to participate in therapies. PT order for eval placed at their request. Ms. Fantroy shares that she believes she can walk again if she had more therapy. I did discuss with them plans for hospice and they are open to this but do not feel they need this at this current time as she is feeling improved in her current state. They will be open to hospice assistance in the future as indicated. At this time they are requesting transition to facility with goal for rehab and anticipation of transition to long term care. No plans for hospice at this time.   All questions/concerns addressed to the best of my ability for patient, brother, and facility representative. Emotional  support provided.   Exam: Alert, appropriate in conversation but forgetful. Pleasant interactions today. Cooperative. No distress. Thin, frail. Breathing regular, unlabored. Abd flat.   Plan: - DNR - Continue current care and symptom management - Patient requesting therapy and PT eval ordered - Due to her current stability (eating and without distress) they would like to pursue therapy and hold off on hospice for now - Recommend outpatient palliative care to follow up  35 min  Bernarda Kitty, NP Palliative Medicine Team Pager [phone] (Please see [email or web address] for schedule) Team Phone [phone]

## 2024-04-01 MED ORDER — METOPROLOL TARTRATE 25 MG PO TABS: 12.5000 mg | ORAL_TABLET | Freq: Two times a day (BID) | ORAL | Status: AC

## 2024-04-01 NOTE — Discharge Summary (Signed)
 " Physician Discharge Summary   Patient: Monica Conway MRN: [id number] DOB: 10-Aug-1945  Admit date:     03/22/2024  Discharge date: 04/01/24  Discharge Physician: [name]   PCP: [facility]   Recommendations at discharge:    SNF/long-term care with palliative care follow-up  Discharge Diagnoses: Principal Problem:   Acute on chronic diastolic heart failure (HCC) Active Problems:   Paroxysmal atrial fibrillation with RVR (HCC)   Hypotension   Goals of care, counseling/discussion  Resolved Problems:   * No resolved hospital problems. [facility] Course:  [name] with history of CVA, cognitive deficits, recurrent GI bleeds, paroxysmal A-fib not on anticoagulation, osteoarthritis, severe malnutrition, recent admission with A-fib RVR delirium, Citrobacter UTI, seen by palliative care, initially agreed to SNF and then patient insisted on discharge home with additional home care services. - Back in the ED 1/27 with leg swelling, in the ER tachycardic creatinine 1.26, pro proBNP 4790, UA with pyuria bacteriuria -Admitted, started on antibiotics and diuretics - 1/30: Palliative care meeting, switched to comfort care - TOC following, plan for long-term care with hospice    Assessment and Plan:  Acute on chronic diastolic CHF Hypoalbuminemia, third spacing - Recent echo 1/9 noted EF 55-60%, normal RV - Volume up mildly this admission, compounded by third spacing - GDMT limited by hypotension - Now comfort care, plan for long-term care with palliative  care follow-up   Paroxysmal A-fib with RVR - Briefly used Cardizem  gtt. stopped for hypotension, then started on Amio gtt. - Not on anticoagulation due to history of GI bleed and frequent falls -Will start low-dose metoprolol  12.5 mg p.o. twice daily - Limited options - Now comfort care   Abnormal UA Staph hemolyticus UTI Recent Citrobacter UTI, delirium - Mild leukocytosis on admission, UA is abnormal, started on  ceftriaxone , -Urine culture with staph hemolyticus, likely a contaminant, completed 3 days of ceftriaxone  - Now comfort care   Hyperkalemia -Resolved   Failure to thrive Severe malnutrition Cognitive decline, delirium - Do not anticipate significant improvement in her overall condition, palliative reconsulted  - Continue Seroquel  - Seen by palliative care this admission, now comfort care, disposition is being pursued, plan for long-term care with hospice        Consultants: Palliative care Procedures performed:  Disposition: Skilled nursing facility Diet recommendation:  Regular diet DISCHARGE MEDICATION: Allergies as of 04/01/2024       Reactions   Pine Shortness Of Breath   Pollen Extract Shortness Of Breath   Caduet [amlodipine -atorvastatin] Other (See Comments)   Unknown reaction        Medication List     STOP taking these medications    diltiazem  120 MG 24 hr capsule Commonly known as: CARDIZEM  CD   diltiazem  180 MG 24 hr capsule Commonly known as: CARDIZEM  CD   lidocaine  5 % Commonly known as: LIDODERM    magnesium  oxide 400 (240 Mg) MG tablet Commonly known as: MAG-OX   metoprolol  succinate 25 MG 24 hr tablet Commonly known as: TOPROL -XL   metoprolol  succinate 50 MG 24 hr tablet Commonly known as: TOPROL -XL   potassium chloride  SA 20 MEQ tablet Commonly known as: KLOR-CON  M   sodium bicarbonate  650 MG tablet   traMADol  50 MG tablet Commonly known as: ULTRAM        TAKE these medications    acetaminophen  500 MG tablet Commonly known as: TYLENOL  Take 1 tablet (500 mg total) by mouth every 8 (eight) hours as needed for mild pain (  pain score 1-3). What changed:  how much to take when to take this reasons to take this   albuterol  108 (90 Base) MCG/ACT inhaler Commonly known as: VENTOLIN  HFA Inhale 2 puffs into the lungs every 6 (six) hours as needed for wheezing or shortness of breath.   feeding supplement Liqd Take 237 mLs by mouth  2 (two) times daily between meals.   metoprolol  tartrate 25 MG tablet Commonly known as: LOPRESSOR  Take 0.5 tablets (12.5 mg total) by mouth 2 (two) times daily.   mirabegron  ER 50 MG Tb24 tablet Commonly known as: MYRBETRIQ  Take 50 mg by mouth daily.   pantoprazole  40 MG tablet Commonly known as: PROTONIX  Take 1 tablet (40 mg total) by mouth 2 (two) times daily.   QUEtiapine  25 MG tablet Commonly known as: SEROQUEL  Take 1 tablet (25 mg total) by mouth at bedtime. What changed:  when to take this Another medication with the same name was removed. Continue taking this medication, and follow the directions you see here.   senna-docusate 8.6-50 MG tablet Commonly known as: Senokot-S Take 1 tablet by mouth at bedtime.        Discharge Exam: Filed Weights   03/23/24 1136 03/24/24 0300 03/25/24 0437  Weight: 39.8 kg 38 kg 38 kg   Appears in no acute distress S1-S2, regular Lungs clear to auscultation bilaterally Abdomen is soft, nontender, no organomegaly  Condition at discharge: good  The results of significant diagnostics from this hospitalization (including imaging, microbiology, ancillary and laboratory) are listed below for reference.   Imaging Studies: ECHOCARDIOGRAM LIMITED Result Date: 03/24/2024    ECHOCARDIOGRAM LIMITED REPORT   Patient Name:   Monica Conway Date of Exam: 03/24/2024 Medical Rec #:  [id number]         Height:       62.0 in Accession #:    [id number]        Weight:       83.8 lb Date of Birth:  11-24-1945         BSA:          1.321 m Patient Age:    78 years          BP:           102/61 mmHg Patient Gender: F                 HR:           72 bpm. Exam Location:  Inpatient Procedure: Limited Echo, Cardiac Doppler and Color Doppler (Both Spectral and            Color Flow Doppler were utilized during procedure). Indications:    Acute on chronic heart failure  History:        Patient has prior history of Echocardiogram examinations, most                  recent 03/04/2024. CHF, Arrythmias:Atrial Fibrillation,                 Signs/Symptoms:Hypotension; Risk Factors:Hypertension.  Sonographer:    Juliene Rucks Referring Phys: [id number] MARSHA ADA  Sonographer Comments: Technically difficult study due to poor echo windows. Image acquisition challenging due to uncooperative patient. IMPRESSIONS  1. Left ventricular ejection fraction, by estimation, is 60 to 65%. The left ventricle has normal function. The left ventricle has no regional wall motion abnormalities. There is mild left ventricular hypertrophy of the basal-septal segment. Left ventricular diastolic parameters are consistent with Grade II diastolic  dysfunction (pseudonormalization). Elevated left atrial pressure.  2. Right ventricular systolic function was not well visualized.  3. The aortic valve was not well visualized. There is moderate calcification of the aortic valve. There is moderate thickening of the aortic valve. Doppler of the AV was not performed. FINDINGS  Left Ventricle: Left ventricular ejection fraction, by estimation, is 60 to 65%. The left ventricle has normal function. The left ventricle has no regional wall motion abnormalities. There is mild left ventricular hypertrophy of the basal-septal segment. Left ventricular diastolic parameters are consistent with Grade II diastolic dysfunction (pseudonormalization). Elevated left atrial pressure. Right Ventricle: Right ventricular systolic function was not well visualized. Aortic Valve: The aortic valve was not well visualized. There is moderate calcification of the aortic valve. There is moderate thickening of the aortic valve. LEFT VENTRICLE PLAX 2D LVIDd:         4.10 cm   Diastology LVIDs:         3.00 cm   LV e' medial:    4.33 cm/s LV PW:         0.90 cm   LV E/e' medial:  18.3 LV IVS:        1.20 cm   LV e' lateral:   6.36 cm/s LVOT diam:     1.90 cm   LV E/e' lateral: 12.4 LVOT Area:     2.84 cm  LEFT ATRIUM             Index LA diam:         3.10 cm 2.35 cm/m LA Vol (A2C):   17.9 ml 13.55 ml/m LA Vol (A4C):   29.1 ml 22.03 ml/m LA Biplane Vol: 22.4 ml 16.96 ml/m   AORTA Ao Root diam: 2.90 cm MITRAL VALVE MV Area (PHT): 4.31 cm    SHUNTS MV Decel Time: 176 msec    Systemic Diam: 1.90 cm MV E velocity: 79.10 cm/s MV A velocity: 83.40 cm/s MV E/A ratio:  0.95 Wilbert Bihari MD Electronically signed by Wilbert Bihari MD Signature Date/Time: 03/24/2024/7:32:46 PM    Final    DG Chest 2 View Result Date: 03/22/2024 EXAM: 2 VIEW(S) XRAY OF THE CHEST 03/22/2024 11:04:01 PM COMPARISON: 03/03/2024 CLINICAL HISTORY: Shortness of breath, volume overload. FINDINGS: LUNGS AND PLEURA: No focal pulmonary opacity. No pleural effusion. No pneumothorax. HEART AND MEDIASTINUM: Calcification of the aorta. Large esophageal hiatal hernia behind the heart. Heart size and pulmonary vascularity are normal. Mediastinal contours appear intact. BONES AND SOFT TISSUES: Degenerative changes in the spine and shoulders. Compression deformity of a lower thoracic vertebra, unchanged since the prior study. IMPRESSION: 1. No acute cardiopulmonary findings. 2. Large esophageal hiatal hernia. Electronically signed by: Elsie Gravely MD 03/22/2024 11:15 PM EST RP Workstation: [id number]   ECHOCARDIOGRAM COMPLETE Result Date: 03/04/2024    ECHOCARDIOGRAM REPORT   Patient Name:   CHAYNA SURRATT Date of Exam: 03/04/2024 Medical Rec #:  [id number]         Height:       62.0 in Accession #:    [id number]        Weight:       91.3 lb Date of Birth:  1945/12/18         BSA:          1.370 m Patient Age:    78 years          BP:           154/95 mmHg Patient Gender: F  HR:           56 bpm. Exam Location:  Inpatient Procedure: 2D Echo, Cardiac Doppler and Color Doppler (Both Spectral and Color            Flow Doppler were utilized during procedure). Indications:    Elevated troponin  History:        Patient has prior history of Echocardiogram examinations, most                  recent 01/25/2023. Arrythmias:Atrial Fibrillation; Risk                 Factors:Hypertension.  Sonographer:    Merlynn Argyle Referring Phys: [id number] CAROLE N HALL  Sonographer Comments: Technically difficult study due to poor echo windows. Image acquisition challenging due to uncooperative patient and Image acquisition challenging due to respiratory motion. IMPRESSIONS  1. Left ventricular ejection fraction, by estimation, is 55 to 60%. The left ventricle has normal function. The left ventricle has no regional wall motion abnormalities. Left ventricular diastolic parameters were normal.  2. Right ventricular systolic function is low normal. The right ventricular size is mildly enlarged. Tricuspid regurgitation signal is inadequate for assessing PA pressure.  3. The mitral valve is degenerative. No evidence of mitral valve regurgitation. No evidence of mitral stenosis.  4. The aortic valve is calcified. Aortic valve regurgitation is not visualized. Mild aortic valve stenosis. Aortic valve area, by VTI measures 1.20 cm. Aortic valve mean gradient measures 9.0 mmHg. Aortic valve Vmax measures 2.28 m/s. Comparison(s): A prior study was performed on 01/25/2023. There was grade I diastolic dysfunction with similar systolic function and mild to moderate aortic stenosis with an AV Vmax 2.8 m/s. FINDINGS  Left Ventricle: Left ventricular ejection fraction, by estimation, is 55 to 60%. The left ventricle has normal function. The left ventricle has no regional wall motion abnormalities. The left ventricular internal cavity size was normal in size. There is  no left ventricular hypertrophy. Left ventricular diastolic parameters were normal. Right Ventricle: The right ventricular size is mildly enlarged. No increase in right ventricular wall thickness. Right ventricular systolic function is low normal. Tricuspid regurgitation signal is inadequate for assessing PA pressure. Left Atrium: Left atrial size was normal in size. Right  Atrium: Right atrial size was normal in size. Pericardium: There is no evidence of pericardial effusion. Mitral Valve: The mitral valve is degenerative in appearance. Mild mitral annular calcification. No evidence of mitral valve regurgitation. No evidence of mitral valve stenosis. Tricuspid Valve: The tricuspid valve is normal in structure. Tricuspid valve regurgitation is not demonstrated. No evidence of tricuspid stenosis. Aortic Valve: The aortic valve is calcified. Aortic valve regurgitation is not visualized. Mild aortic stenosis is present. Aortic valve mean gradient measures 9.0 mmHg. Aortic valve peak gradient measures 20.8 mmHg. Aortic valve area, by VTI measures 1.20 cm. Pulmonic Valve: The pulmonic valve was normal in structure. Pulmonic valve regurgitation is not visualized. No evidence of pulmonic stenosis. Aorta: The aortic root is normal in size and structure. Venous: The inferior vena cava was not well visualized. IAS/Shunts: No atrial level shunt detected by color flow Doppler.  LEFT VENTRICLE PLAX 2D LVOT diam:     1.70 cm   Diastology LV SV:         47        LV e' medial:    7.07 cm/s LV SV Index:   34        LV E/e' medial:  10.6 LVOT Area:  2.27 cm  LV e' lateral:   8.70 cm/s                          LV E/e' lateral: 8.6  RIGHT VENTRICLE RV Basal diam:  3.00 cm RV S prime:     12.20 cm/s TAPSE (M-mode): 1.6 cm LEFT ATRIUM             Index        RIGHT ATRIUM          Index LA Vol (A2C):   12.5 ml 9.13 ml/m   RA Area:     7.59 cm LA Vol (A4C):   13.1 ml 9.56 ml/m   RA Volume:   13.80 ml 10.08 ml/m LA Biplane Vol: 13.9 ml 10.15 ml/m  AORTIC VALVE AV Area (Vmax):    0.85 cm AV Area (Vmean):   0.86 cm AV Area (VTI):     1.20 cm AV Vmax:           228.00 cm/s AV Vmean:          132.000 cm/s AV VTI:            0.394 m AV Peak Grad:      20.8 mmHg AV Mean Grad:      9.0 mmHg LVOT Vmax:         85.40 cm/s LVOT Vmean:        50.000 cm/s LVOT VTI:          0.208 m LVOT/AV VTI ratio: 0.53   AORTA Ao Root diam: 3.00 cm MITRAL VALVE MV Area (PHT): 2.90 cm    SHUNTS MV Decel Time: 262 msec    Systemic VTI:  0.21 m MV E velocity: 75.20 cm/s  Systemic Diam: 1.70 cm MV A velocity: 83.60 cm/s MV E/A ratio:  0.90 Emeline Calender Electronically signed by Emeline Calender Signature Date/Time: 03/04/2024/6:03:09 PM    Final    DG Chest Portable 1 View Result Date: 03/03/2024 EXAM: 1 VIEW(S) XRAY OF THE CHEST 03/03/2024 08:13:00 PM COMPARISON: 12/18/2023 CLINICAL HISTORY: sepsis FINDINGS: LUNGS AND PLEURA: No focal pulmonary opacity. No pleural effusion. No pneumothorax. HEART AND MEDIASTINUM: Aortic arch calcifications. BONES AND SOFT TISSUES: Large hiatal hernia. No acute osseous abnormality. IMPRESSION: 1. No acute cardiopulmonary abnormality. Electronically signed by: Pinkie Pebbles MD MD 03/03/2024 08:22 PM EST RP Workstation: [id number]   CT Head Wo Contrast Result Date: 03/03/2024 CLINICAL DATA:  Altered mental status EXAM: CT HEAD WITHOUT CONTRAST TECHNIQUE: Contiguous axial images were obtained from the base of the skull through the vertex without intravenous contrast. RADIATION DOSE REDUCTION: This exam was performed according to the departmental dose-optimization program which includes automated exposure control, adjustment of the mA and/or kV according to patient size and/or use of iterative reconstruction technique. COMPARISON:  Head CT 12/18/2023, MRI 01/24/2023 FINDINGS: Brain: Limited by scan technique and patient positioning. Allowing for this, no gross territorial infarct, hemorrhage or intracranial mass is seen. Chronic infarct in the right basal ganglia. Chronic right occipital infarct. Chronic small vessel ischemic changes of the white matter. No ventricular enlargement is seen Vascular: No unexpected calcification Skull: No obvious fracture Sinuses/Orbits: No acute finding Other: None IMPRESSION: 1. Limited exam due to scan technique and patient positioning. Allowing for this, no gross acute  intracranial abnormality is seen. 2. Chronic infarcts in the right basal ganglia and right occipital lobe. Chronic small vessel ischemic changes of the white matter. Electronically Signed   By:  Luke Bun M.D.   On: 03/03/2024 17:48    Microbiology: Results for orders placed or performed during the hospital encounter of 03/22/24  Urine Culture     Status: Abnormal   Collection Time: 03/22/24  9:16 PM   Specimen: Urine, Random  Result Value Ref Range Status   Specimen Description URINE, RANDOM  Final   Special Requests   Final    NONE Reflexed from [id number] Performed at [facility], [address], [place], [place] [address]    Culture >=100,000 COLONIES/mL STAPHYLOCOCCUS HAEMOLYTICUS (A)  Final   Report Status 03/25/2024 FINAL  Final   Organism ID, Bacteria STAPHYLOCOCCUS HAEMOLYTICUS (A)  Final      Susceptibility   Staphylococcus haemolyticus - MIC*    CIPROFLOXACIN >=8 RESISTANT Resistant     GENTAMICIN >=16 RESISTANT Resistant     NITROFURANTOIN <=16 SENSITIVE Sensitive     OXACILLIN >=4 RESISTANT Resistant     TETRACYCLINE >=16 RESISTANT Resistant     VANCOMYCIN  1 SENSITIVE Sensitive     TRIMETH/SULFA 160 RESISTANT Resistant     RIFAMPIN >=32 RESISTANT Resistant     Inducible Clindamycin NEGATIVE Sensitive     * >=100,000 COLONIES/mL STAPHYLOCOCCUS HAEMOLYTICUS    Labs: CBC: No results for input(s): WBC, NEUTROABS, HGB, HCT, MCV, PLT in the last 168 hours. Basic Metabolic Panel: No results for input(s): NA, K, CL, CO2, GLUCOSE, BUN, CREATININE, CALCIUM , MG, PHOS in the last 168 hours. Liver Function Tests: No results for input(s): AST, ALT, ALKPHOS, BILITOT, PROT, ALBUMIN in the last 168 hours. CBG: Recent Labs  Lab 03/25/24 1607  GLUCAP 85    Discharge time spent: greater than 30 minutes.  Signed: [name], MD [facility] 04/01/2024 "

## 2024-04-01 NOTE — TOC Transition Note (Signed)
 Transition of Care [facility]) - Discharge Note   Patient Details  Name: Monica Conway MRN: [id number] Date of Birth: 10/07/1945  [name] CM/SW Contact:  [name], LCSW Phone Number: 04/01/2024, 1:08 PM   Clinical Narrative:     Per MD patient ready for DC to [facility]. RN, patient, patient's family, and facility notified of DC. Discharge Summary and FL2 sent to facility. RN to call report prior to discharge [phone]). DC packet on chart. Ambulance transport requested for patient.   CSW will sign off for now as social work intervention is no longer needed. Please consult us  again if new needs arise.   Final next level of care: Skilled Nursing Facility Barriers to Discharge: No Barriers Identified   Patient Goals and CMS Choice Patient states their goals for this hospitalization and ongoing recovery are:: Unable to assess CMS Medicare.gov Compare Post Acute Care list provided to:: Other (Comment Required) Choice offered to / list presented to : Sibling [name] ownership interest in [facility].provided to:: Sibling    Discharge Placement              Patient chooses bed at: [name] Patient to be transferred to facility by: [facility] Name of family member notified: [name], Emergency Contact  [phone] (Mobile Patient and family notified of of transfer: 04/01/24  Discharge Plan and Services Additional resources added to the After Visit Summary for   In-house Referral: Clinical Social Work   Post Acute Care Choice: Skilled Nursing Facility                               Social Drivers of Health (SDOH) Interventions SDOH Screenings   Food Insecurity: No Food Insecurity (03/24/2024)  Housing: Low Risk (03/24/2024)  Transportation Needs: Unmet Transportation Needs (03/24/2024)  Utilities: Not At Risk (03/24/2024)  Social Connections: Patient Declined (03/24/2024)  Tobacco Use: Low Risk (03/14/2024)      Readmission Risk Interventions    03/11/2024   10:31 AM 10/26/2023    1:57 PM 04/06/2023    8:44 AM  Readmission Risk Prevention Plan  Post Dischage Appt  Complete   Medication Screening  Complete   Transportation Screening Complete Complete Complete  PCP or Specialist Appt within 3-5 Days   Complete  HRI or Home Care Consult   Complete  Social Work Consult for Recovery Care Planning/Counseling   Complete  Palliative Care Screening   Not Applicable  Medication Review Oceanographer) Complete  Complete  PCP or Specialist appointment within 3-5 days of discharge Complete    HRI or Home Care Consult Complete    SW Recovery Care/Counseling Consult Complete    Palliative Care Screening Complete    Skilled Nursing Facility Complete

## 2024-04-01 NOTE — Plan of Care (Signed)
" °  Problem: Education: Goal: Knowledge of General Education information will improve Description: Including pain rating scale, medication(s)/side effects and non-pharmacologic comfort measures Outcome: Progressing   Problem: Health Behavior/Discharge Planning: Goal: Ability to manage health-related needs will improve Outcome: Progressing   Problem: Clinical Measurements: Goal: Ability to maintain clinical measurements within normal limits will improve Outcome: Progressing Goal: Will remain free from infection Outcome: Progressing Goal: Diagnostic test results will improve Outcome: Progressing Goal: Respiratory complications will improve Outcome: Progressing Goal: Cardiovascular complication will be avoided Outcome: Progressing   Problem: Elimination: Goal: Will not experience complications related to bowel motility Outcome: Progressing Goal: Will not experience complications related to urinary retention Outcome: Progressing   Problem: Pain Managment: Goal: General experience of comfort will improve and/or be controlled Outcome: Progressing   Problem: Coping: Goal: Ability to identify and develop effective coping behavior will improve Outcome: Progressing   Problem: Education: Goal: Knowledge of the prescribed therapeutic regimen will improve Outcome: Progressing   Problem: Clinical Measurements: Goal: Quality of life will improve Outcome: Progressing   Problem: Pain Management: Goal: Satisfaction with pain management regimen will improve Outcome: Progressing   "

## 2024-04-01 NOTE — Progress Notes (Incomplete)
 [facility]  PROGRESS NOTE  Monica Conway FMW:[id number] DOB: 03-17-1945 DOA: 03/22/2024 PCP: [facility]   Brief HPI:    [name] with history of CVA, cognitive deficits, recurrent GI bleeds, paroxysmal A-fib not on anticoagulation, osteoarthritis, severe malnutrition, recent admission with A-fib RVR delirium, Citrobacter UTI, seen by palliative care, initially agreed to SNF and then patient insisted on discharge home with additional home care services. - Back in the ED 1/27 with leg swelling, in the ER tachycardic creatinine 1.26, pro proBNP 4790, UA with pyuria bacteriuria -Admitted, started on antibiotics and diuretics - 1/30: Palliative care meeting, switched to comfort care - TOC following, plan for long-term care with outpatient palliative services   Assessment/Plan:   Acute on chronic diastolic CHF Hypoalbuminemia, third spacing - Recent echo 1/9 noted EF 55-60%, normal RV - Volume up mildly this admission, compounded by third spacing - GDMT limited by hypotension - Now comfort care, plan for long-term care with palliative services (not hospice anymore), TOC following   Paroxysmal A-fib with RVR - Briefly used Cardizem  gtt. stopped for hypotension, then started on Amio gtt which was then stopped. - Not on anticoagulation due to history of GI bleed and frequent falls - Limited options   Abnormal UA Staph hemolyticus UTI Recent Citrobacter UTI, delirium - Mild leukocytosis on admission, UA is abnormal, started on ceftriaxone , -Urine culture with staph hemolyticus, likely a contaminant, completed 3 days of ceftriaxone  - Now comfort care   Hyperkalemia -Resolved   Failure to thrive Severe malnutrition Cognitive decline, delirium - Do not anticipate significant improvement in her overall condition - Continue Seroquel  - Seen by palliative care this admission, now comfort care, disposition is being pursued, plan for long-term care with outpatient palliative  care   Initial plan was for [facility] but they had a sprinkler system malfunction and no longer had bed availability. [facility] able to admit patient tomorrow and plan for discharge tomorrow       DVT prophylaxis: ***  Medications     amiodarone   200 mg Oral Daily   antiseptic oral rinse  15 mL Topical BID   Gerhardt's butt cream  1 Application Topical QID   pantoprazole   40 mg Oral BID   phenazopyridine   100 mg Oral TID WC   polyethylene glycol  17 g Oral Daily   QUEtiapine   25 mg Oral QHS   senna-docusate  1 tablet Oral QHS     Data Reviewed:   CBG:  Recent Labs  Lab 03/25/24 1607  GLUCAP 85    SpO2: 100 %    Vitals:   03/31/24 1601 03/31/24 2047 04/01/24 0512 04/01/24 0730  BP: 105/65 (!) 143/93 (!) 152/89 (!) 146/82  Pulse: 81 87 92 92  Resp: 16 17 18 17   Temp: 98.4 F (36.9 C) 98.3 F (36.8 C) 98.5 F (36.9 C) 98.6 F (37 C)  TempSrc: Oral   Oral  SpO2: 98% 97% 96% 100%  Weight:      Height:          Data Reviewed:  Basic Metabolic Panel: No results for input(s): NA, K, CL, CO2, GLUCOSE, BUN, CREATININE, CALCIUM , MG, PHOS in the last 168 hours.  CBC: No results for input(s): WBC, NEUTROABS, HGB, HCT, MCV, PLT in the last 168 hours.  LFT No results for input(s): AST, ALT, ALKPHOS, BILITOT, PROT, ALBUMIN in the last 168 hours.   Antibiotics: Anti-infectives (From admission, onward)    Start     Dose/Rate Route Frequency Ordered  Stop   03/24/24 1000  cefTRIAXone  (ROCEPHIN ) 1 g in sodium chloride  0.9 % 100 mL IVPB  Status:  Discontinued        1 g 200 mL/hr over 30 Minutes Intravenous Every 24 hours 03/23/24 1213 03/25/24 1130   03/22/24 2300  cefTRIAXone  (ROCEPHIN ) 2 g in sodium chloride  0.9 % 100 mL IVPB  Status:  Discontinued        2 g 200 mL/hr over 30 Minutes Intravenous  Once 03/22/24 2257 03/22/24 2259   03/22/24 2300  cefTRIAXone  (ROCEPHIN ) 1 g in sodium chloride  0.9 % 100 mL IVPB         1 g 200 mL/hr over 30 Minutes Intravenous  Once 03/22/24 2259 03/23/24 0127        CONSULTS ***  Code Status: ***  Family Communication: ***     Subjective   ***   Objective    Physical Examination:   General:  *** Cardiovascular: *** Respiratory: *** Abdomen: *** Extremities: ***  Neurologic:  ***             Monica Conway   [facility] If 7PM-7AM, please contact night-coverage at [email or web address], Office  [phone]   04/01/2024, 11:39 AM  LOS: 9 days

## 2024-04-01 NOTE — Progress Notes (Signed)
Report given Crystal, RN
